# Patient Record
Sex: Male | Born: 1940
Health system: Southern US, Community
[De-identification: ages and names within clinical notes are randomized; demographics above are authoritative.]

## PROBLEM LIST (undated history)

## (undated) ENCOUNTER — Ambulatory Visit

## (undated) DIAGNOSIS — K5730 Diverticulosis of large intestine without perforation or abscess without bleeding: Secondary | ICD-10-CM

## (undated) DIAGNOSIS — M1990 Unspecified osteoarthritis, unspecified site: Secondary | ICD-10-CM

## (undated) DIAGNOSIS — IMO0002 Reserved for concepts with insufficient information to code with codable children: Secondary | ICD-10-CM

## (undated) DIAGNOSIS — G4733 Obstructive sleep apnea (adult) (pediatric): Secondary | ICD-10-CM

## (undated) DIAGNOSIS — J329 Chronic sinusitis, unspecified: Secondary | ICD-10-CM

## (undated) DIAGNOSIS — I10 Essential (primary) hypertension: Secondary | ICD-10-CM

## (undated) DIAGNOSIS — K219 Gastro-esophageal reflux disease without esophagitis: Secondary | ICD-10-CM

## (undated) DIAGNOSIS — R972 Elevated prostate specific antigen [PSA]: Secondary | ICD-10-CM

## (undated) DIAGNOSIS — E785 Hyperlipidemia, unspecified: Secondary | ICD-10-CM

## (undated) DIAGNOSIS — K4090 Unilateral inguinal hernia, without obstruction or gangrene, not specified as recurrent: Secondary | ICD-10-CM

## (undated) HISTORY — DX: Essential (primary) hypertension: I10

## (undated) HISTORY — DX: Elevated prostate specific antigen (PSA): R97.20

## (undated) HISTORY — DX: Obstructive sleep apnea (adult) (pediatric): G47.33

## (undated) HISTORY — DX: Gastro-esophageal reflux disease without esophagitis: K21.9

## (undated) HISTORY — DX: Reserved for concepts with insufficient information to code with codable children: IMO0002

## (undated) HISTORY — DX: Hyperlipidemia, unspecified: E78.5

## (undated) HISTORY — DX: Chronic sinusitis, unspecified: J32.9

## (undated) HISTORY — PX: TONSILLECTOMY: SUR1361

## (undated) HISTORY — DX: Unilateral inguinal hernia, without obstruction or gangrene, not specified as recurrent: K40.90

## (undated) HISTORY — PX: PROSTATE BIOPSY: SHX241

## (undated) HISTORY — DX: Diverticulosis of large intestine without perforation or abscess without bleeding: K57.30

---

## 2005-03-16 ENCOUNTER — Ambulatory Visit: Payer: Self-pay | Admitting: Internal Medicine

## 2005-08-24 ENCOUNTER — Ambulatory Visit: Payer: Self-pay | Admitting: Internal Medicine

## 2005-09-07 ENCOUNTER — Ambulatory Visit: Payer: Self-pay | Admitting: Internal Medicine

## 2005-10-02 ENCOUNTER — Ambulatory Visit: Payer: Self-pay | Admitting: Internal Medicine

## 2005-10-17 ENCOUNTER — Ambulatory Visit: Payer: Self-pay | Admitting: Internal Medicine

## 2005-12-24 ENCOUNTER — Ambulatory Visit: Payer: Self-pay | Admitting: Internal Medicine

## 2006-01-01 HISTORY — PX: COLONOSCOPY: SHX174

## 2006-01-04 ENCOUNTER — Ambulatory Visit: Payer: Self-pay | Admitting: Gastroenterology

## 2006-01-09 ENCOUNTER — Ambulatory Visit: Payer: Self-pay | Admitting: Internal Medicine

## 2006-02-05 ENCOUNTER — Ambulatory Visit: Payer: Self-pay | Admitting: Gastroenterology

## 2006-02-05 LAB — HM COLONOSCOPY

## 2006-04-02 ENCOUNTER — Ambulatory Visit: Payer: Self-pay | Admitting: Gastroenterology

## 2006-09-30 ENCOUNTER — Telehealth (INDEPENDENT_AMBULATORY_CARE_PROVIDER_SITE_OTHER): Payer: Self-pay | Admitting: *Deleted

## 2006-10-01 ENCOUNTER — Ambulatory Visit: Payer: Self-pay | Admitting: Internal Medicine

## 2006-10-01 DIAGNOSIS — R12 Heartburn: Secondary | ICD-10-CM

## 2006-10-01 DIAGNOSIS — K219 Gastro-esophageal reflux disease without esophagitis: Secondary | ICD-10-CM

## 2006-11-21 ENCOUNTER — Ambulatory Visit: Payer: Self-pay | Admitting: Internal Medicine

## 2006-12-25 ENCOUNTER — Telehealth (INDEPENDENT_AMBULATORY_CARE_PROVIDER_SITE_OTHER): Payer: Self-pay | Admitting: *Deleted

## 2006-12-25 ENCOUNTER — Ambulatory Visit: Payer: Self-pay | Admitting: Internal Medicine

## 2007-01-16 ENCOUNTER — Ambulatory Visit: Payer: Self-pay | Admitting: Internal Medicine

## 2007-01-23 ENCOUNTER — Ambulatory Visit: Payer: Self-pay | Admitting: Internal Medicine

## 2007-02-24 ENCOUNTER — Encounter: Payer: Self-pay | Admitting: Internal Medicine

## 2007-05-30 ENCOUNTER — Telehealth (INDEPENDENT_AMBULATORY_CARE_PROVIDER_SITE_OTHER): Payer: Self-pay | Admitting: *Deleted

## 2007-09-25 ENCOUNTER — Ambulatory Visit: Payer: Self-pay | Admitting: Internal Medicine

## 2007-10-02 ENCOUNTER — Ambulatory Visit: Payer: Self-pay | Admitting: Internal Medicine

## 2007-12-24 ENCOUNTER — Ambulatory Visit: Payer: Self-pay | Admitting: Internal Medicine

## 2008-07-02 ENCOUNTER — Ambulatory Visit: Payer: Self-pay | Admitting: Internal Medicine

## 2008-07-02 DIAGNOSIS — IMO0001 Reserved for inherently not codable concepts without codable children: Secondary | ICD-10-CM

## 2008-07-02 DIAGNOSIS — M549 Dorsalgia, unspecified: Secondary | ICD-10-CM | POA: Insufficient documentation

## 2008-10-26 ENCOUNTER — Ambulatory Visit: Payer: Self-pay | Admitting: Family Medicine

## 2008-10-26 ENCOUNTER — Encounter: Payer: Self-pay | Admitting: Internal Medicine

## 2008-10-26 DIAGNOSIS — D239 Other benign neoplasm of skin, unspecified: Secondary | ICD-10-CM | POA: Insufficient documentation

## 2008-11-03 ENCOUNTER — Encounter: Payer: Self-pay | Admitting: Family Medicine

## 2008-11-12 ENCOUNTER — Ambulatory Visit: Payer: Self-pay | Admitting: Gastroenterology

## 2008-11-12 LAB — CONVERTED CEMR LAB
Alkaline Phosphatase: 53 units/L (ref 39–117)
Eosinophils Absolute: 0.2 10*3/uL (ref 0.0–0.7)
Eosinophils Relative: 2.6 % (ref 0.0–5.0)
Glucose, Bld: 108 mg/dL — ABNORMAL HIGH (ref 70–99)
HCT: 49.4 % (ref 39.0–52.0)
Lymphs Abs: 1.9 10*3/uL (ref 0.7–4.0)
MCHC: 34.9 g/dL (ref 30.0–36.0)
MCV: 89.8 fL (ref 78.0–100.0)
Monocytes Absolute: 0.6 10*3/uL (ref 0.1–1.0)
Platelets: 232 10*3/uL (ref 150.0–400.0)
RDW: 11.6 % (ref 11.5–14.6)
Sodium: 141 meq/L (ref 135–145)
Total Bilirubin: 1.1 mg/dL (ref 0.3–1.2)
Total Protein: 6.7 g/dL (ref 6.0–8.3)
WBC: 7.8 10*3/uL (ref 4.5–10.5)

## 2008-11-21 ENCOUNTER — Emergency Department (HOSPITAL_COMMUNITY): Admission: EM | Admit: 2008-11-21 | Discharge: 2008-11-21 | Payer: Self-pay | Admitting: Emergency Medicine

## 2008-11-24 ENCOUNTER — Ambulatory Visit: Payer: Self-pay | Admitting: Internal Medicine

## 2008-11-29 ENCOUNTER — Encounter: Payer: Self-pay | Admitting: Internal Medicine

## 2008-11-30 ENCOUNTER — Telehealth (INDEPENDENT_AMBULATORY_CARE_PROVIDER_SITE_OTHER): Payer: Self-pay | Admitting: *Deleted

## 2008-12-30 ENCOUNTER — Ambulatory Visit: Payer: Self-pay | Admitting: Internal Medicine

## 2008-12-30 DIAGNOSIS — K6289 Other specified diseases of anus and rectum: Secondary | ICD-10-CM | POA: Insufficient documentation

## 2009-02-08 ENCOUNTER — Ambulatory Visit: Payer: Self-pay | Admitting: Internal Medicine

## 2009-02-08 DIAGNOSIS — R198 Other specified symptoms and signs involving the digestive system and abdomen: Secondary | ICD-10-CM

## 2009-03-21 ENCOUNTER — Ambulatory Visit: Payer: Self-pay | Admitting: Internal Medicine

## 2009-05-20 ENCOUNTER — Ambulatory Visit: Payer: Self-pay | Admitting: Internal Medicine

## 2009-05-20 DIAGNOSIS — R1032 Left lower quadrant pain: Secondary | ICD-10-CM | POA: Insufficient documentation

## 2009-05-20 DIAGNOSIS — K5730 Diverticulosis of large intestine without perforation or abscess without bleeding: Secondary | ICD-10-CM | POA: Insufficient documentation

## 2009-05-20 DIAGNOSIS — N509 Disorder of male genital organs, unspecified: Secondary | ICD-10-CM | POA: Insufficient documentation

## 2009-05-20 DIAGNOSIS — K429 Umbilical hernia without obstruction or gangrene: Secondary | ICD-10-CM | POA: Insufficient documentation

## 2009-05-20 DIAGNOSIS — K439 Ventral hernia without obstruction or gangrene: Secondary | ICD-10-CM | POA: Insufficient documentation

## 2009-08-24 ENCOUNTER — Ambulatory Visit: Payer: Self-pay | Admitting: Internal Medicine

## 2009-08-24 DIAGNOSIS — J310 Chronic rhinitis: Secondary | ICD-10-CM | POA: Insufficient documentation

## 2009-08-24 DIAGNOSIS — T887XXA Unspecified adverse effect of drug or medicament, initial encounter: Secondary | ICD-10-CM | POA: Insufficient documentation

## 2009-09-16 ENCOUNTER — Ambulatory Visit: Payer: Self-pay | Admitting: Internal Medicine

## 2009-09-16 DIAGNOSIS — R030 Elevated blood-pressure reading, without diagnosis of hypertension: Secondary | ICD-10-CM | POA: Insufficient documentation

## 2009-11-03 ENCOUNTER — Ambulatory Visit: Payer: Self-pay | Admitting: Internal Medicine

## 2009-11-03 DIAGNOSIS — I10 Essential (primary) hypertension: Secondary | ICD-10-CM | POA: Insufficient documentation

## 2009-11-03 DIAGNOSIS — K4090 Unilateral inguinal hernia, without obstruction or gangrene, not specified as recurrent: Secondary | ICD-10-CM | POA: Insufficient documentation

## 2009-11-18 ENCOUNTER — Encounter: Payer: Self-pay | Admitting: Internal Medicine

## 2009-11-23 ENCOUNTER — Ambulatory Visit: Payer: Self-pay | Admitting: Internal Medicine

## 2010-01-31 NOTE — Assessment & Plan Note (Signed)
Summary: umbilical hernia//lch   Vital Signs:  Patient profile:   70 year old male Weight:      228 pounds Temp:     98.9 degrees F oral Pulse rate:   91 / minute Resp:     16 per minute BP sitting:   128 / 80  (left arm) Cuff size:   large  Vitals Entered By: Shonna Chock (May 20, 2009 9:04 AM) CC: 1.) Hernia  2.) Abdominal Discomfort -seen urologist last Friday, Abdominal pain Comments REVIEWED MED LIST, PATIENT AGREED DOSE AND INSTRUCTION CORRECT    Primary Care Provider:  Marga Melnick, MD  CC:  1.) Hernia  2.) Abdominal Discomfort -seen urologist last Friday and Abdominal pain.  History of Present Illness: He has had an umbilical hernia which is asymptomatic & stable > 12 months; his Urologist mentioned it. Dr. Merry Lofty is treating him for testicular pain, ?  related to injury 40 years ago The L  testicle "draws up " with "discomfort" lasting hours.  His 3rd concern is LLQ intermittent  "consticting  gas" abdominal pain 2-3 months, as if "a blockage "is there. He had  Augmentin ( for cat bite) related colitis in 12/2008; C. difficle studies were negative. Loose stool normaloized with  Align  .   The patient denies nausea, vomiting, diarrhea, constipation, melena, hematochezia, anorexia, and hematemesis.  The location of the pain is left lower quadrant.  The pain is described as intermittent.  The patient denies the following symptoms: fever, weight loss( 30# gain ove 3 years), dysuria, chest pain, jaundice, and dark urine.  The pain is better with lying down. Last colonoscopy  revealed diverticulosis  in 2008.  Allergies: 1)  ! Augmentin (Amoxicillin-Pot Clavulanate)  Past History:  Past Medical History: URI GERD chest pain heartburn elevated psa Diverticulosis, colon  Review of Systems ENT:  Denies difficulty swallowing and hoarseness. GI:  Complains of gas; denies indigestion; No dysphagia. GU:  Denies discharge, dysuria, and hematuria; UA negative ; PSA  stable @  5.3(high has been 9).. Derm:  Denies changes in color of skin, lesion(s), and rash. Neuro:  Denies brief paralysis, numbness, tingling, and weakness; No radicular pain on L.  Physical Exam  General:  well-nourished,in no acute distress; alert,appropriate and cooperative throughout examination Eyes:  No corneal or conjunctival inflammation noted.No icterus Lungs:  Normal respiratory effort, chest expands symmetrically. Lungs are clear to auscultation, no crackles or wheezes. Heart:  Normal rate and regular rhythm. S1 and S2 normal without gallop, murmur, click, rub or other extra sounds. Abdomen:  Bowel sounds positive,abdomen soft and non-tender without masses, organomegaly. Ventral & umbilical  hernias noted. Genitalia:  Testes bilaterally descended without nodularity, tenderness or masses. No scrotal masses or lesions. No penis lesions or urethral discharge. Extremities:  Negative SLR Neurologic:  alert & oriented X3 and DTRs symmetrical and normal.   Skin:  Intact without suspicious lesions or rashes. No juaundice Cervical Nodes:  No lymphadenopathy noted Axillary Nodes:  No palpable lymphadenopathy Inguinal Nodes:  No significant adenopathy   Impression & Recommendations:  Problem # 1:  ABDOMINAL PAIN, LEFT LOWER QUADRANT (ICD-789.04)  gas pain  Orders: Prescription Created Electronically [phone])  Problem # 2:  DIVERTICULOSIS, COLON (ICD-562.10)  Problem # 3:  TESTICULAR PAIN, LEFT (ICD-608.9)  as per Dr Merry Lofty  Orders: Prescription Created Electronically [phone])  Problem # 4:  UMBILICAL HERNIA (ICD-553.1)  Problem # 5:  VENTRAL HERNIA (ICD-553.20)  Complete Medication List: 1)  Cyclobenzaprine Hcl 5 Mg Tabs (  Cyclobenzaprine hcl) .Marland Kitchen.. 1 two times a day & 1-2 at bedtime as needed 2)  Arthrotec 75-200 Mg-mcg Tabs (Diclofenac-misoprostol) .Marland Kitchen.. 1 two times a day as needed 3)  Gabapentin 100 Mg Caps (Gabapentin) .Marland Kitchen.. 1 every 8 hrs for pain in scrotum 4)  Hyoscyamine  Sulfate 0.125 Mg Subl (Hyoscyamine sulfate) .Marland Kitchen.. 1 sl q 6 hrs as needed gas pain  Patient Instructions: 1)  Consume < 40 grams of HFCS "sugar" / day as discussed. Prescriptions: HYOSCYAMINE SULFATE 0.125 MG SUBL (HYOSCYAMINE SULFATE) 1 SL q 6 hrs as needed gas pain  #30 x 1   Entered and Authorized by:   Marga Melnick MD   Signed by:   Marga Melnick MD on 05/20/2009   Method used:   Faxed to ...       [facility] [phone]* (retail)       [address]       [place], [place]  [address]       Ph: [phone]       Fax: [phone]   RxID:   [id number] GABAPENTIN 100 MG CAPS (GABAPENTIN) 1 every 8 hrs for pain in scrotum  #30 x 1   Entered and Authorized by:   Marga Melnick MD   Signed by:   Marga Melnick MD on 05/20/2009   Method used:   Faxed to ...       [facility] [phone]* (retail)       [address]       [place], [place]  [address]       Ph: [phone]       Fax: [phone]   RxID:   [id number]    Immunization History:  Tetanus/Td Immunization History:    Tetanus/Td:  historical (11/21/2008)

## 2010-01-31 NOTE — Assessment & Plan Note (Signed)
Summary: irritation of bowels/kdc   Vital Signs:  Patient profile:   70 year old male Weight:      232.4 pounds Temp:     98.8 degrees F oral Pulse rate:   88 / minute Resp:     16 per minute BP sitting:   140 / 88  (left arm) Cuff size:   large  Vitals Entered By: Shonna Chock (February 08, 2009 12:27 PM) CC: Bowel Concerns Comments REVIEWED MED LIST, PATIENT AGREED DOSE AND INSTRUCTION CORRECT    Primary Care Provider:  Marga Melnick, MD  CC:  Bowel Concerns.  History of Present Illness: Anal irritation resolved after 10  days but recurred when anal hygiene  measures & Proctofoam completed. PMH of Internal Hemorrhoids @ Colonoscopy 02/05/2006, repeat in 2018.  Allergies: 1)  ! Augmentin (Amoxicillin-Pot Clavulanate)  Past History:  Past Surgical History: Tonsillectomy colonoscopy neg 1998, 2008 neg except for 3 external hemorrhoids, Dr Christella Hartigan, due 2018 abnormal prostate biopsy normal prostate biopsy microwave heat prescription prostate psa 6  Review of Systems General:  Denies weight loss. GI:  Complains of change in bowel habits; denies abdominal pain, bloody stools, dark tarry stools, and indigestion; BMs vary from hard to loose.  Physical Exam  General:  well-nourished,in no acute distress; alert,appropriate and cooperative throughout examination Abdomen:  Bowel sounds positive,abdomen soft and non-tender without masses, organomegaly or hernias noted. Rectal:  No external abnormalities noted.Residual fecal material @ rectum   Impression & Recommendations:  Problem # 1:  OTHER SPECIFIED DISORDER OF RECTUM AND ANUS (ICD-569.49) from rectal  irritation due to residual stool & tissue fragments  Problem # 2:  CHANGE IN BOWELS (ICD-787.99) Intermittent loose stool  Complete Medication List: 1)  Proctofoam 1 % Foam (Pramoxine hcl) .... Apply two times a day as needed  Patient Instructions: 1)  Rectal hygiene as discussed. Trial of fiber supplement samples  provided.Continue hydration to 40 oz / day. Prescriptions: PROCTOFOAM 1 % FOAM (PRAMOXINE HCL) apply two times a day as needed  #1 x 3   Entered and Authorized by:   Marga Melnick MD   Signed by:   Marga Melnick MD on 02/08/2009   Method used:   Print then Give to Patient   RxID:   [id number]

## 2010-01-31 NOTE — Assessment & Plan Note (Signed)
Summary: backpain/kdc   Vital Signs:  Patient profile:   70 year old male Weight:      229.0 pounds Temp:     98.5 degrees F oral Pulse rate:   88 / minute Resp:     16 per minute BP sitting:   122 / 76  (left arm) Cuff size:   large  Vitals Entered By: Shonna Chock (March 21, 2009 3:49 PM) CC: Back pain (off/on concern) Comments REVIEWED MED LIST, PATIENT AGREED DOSE AND INSTRUCTION CORRECT    Primary Care Provider:  Marga Melnick, MD  CC:  Back pain (off/on concern).  History of Present Illness:  Back Pain      This is a 70 year old man who presents with Back pain since 03/18/2009 as soreness after watching tv. Overnight he had severe stiffness.  The patient denies fever, chills, weakness, loss of sensation, fecal incontinence, urinary incontinence, urinary retention, dysuria, rest pain, inability to work, and inability to care for self.  The pain is located in the left low back.  The pain began at home and gradually.  The pain is made worse by standing , waist  flexion, and inactivity.  The pain is made better by activity and muscle relaxants.  Risk factors for serious underlying conditions include age >= 50 years. PMH of trauma to back 20+ years ago, having fallen on ice. He has had recurrent LBP since. PMH DDD in thoracic spine , S/P Physical  Therapy with benefit.  Allergies: 1)  ! Augmentin (Amoxicillin-Pot Clavulanate)  Review of Systems GU:  Denies discharge and hematuria. Derm:  Denies lesion(s) and rash. Heme:  Denies abnormal bruising and bleeding.  Physical Exam  General:  in no acute distress;  cooperative throughout examination; appears uncomfortable-appearing.   Abdomen:  Bowel sounds positive,abdomen soft and non-tender without masses, organomegaly.Ventral hernia Msk:  No deformity or scoliosis noted of thoracic or lumbar spine.  No tenderness to percussion. Lay down & sat up /o help Extremities:  Neg SLR Neurologic:  alert & oriented X3, strength normal in  all extremities, heel/toe gait normal, and DTRs symmetrical and normal.   Skin:  Intact without suspicious lesions or rashes Psych:  memory intact for recent and remote, normally interactive, and good eye contact.     Impression & Recommendations:  Problem # 1:  BACK PAIN, ACUTE (ICD-724.5)  His updated medication list for this problem includes:    Cyclobenzaprine Hcl 5 Mg Tabs (Cyclobenzaprine hcl) .Marland Kitchen... 1 two times a day & 1-2 at bedtime as needed    Arthrotec 75-200 Mg-mcg Tabs (Diclofenac-misoprostol) .Marland Kitchen... 1 two times a day as needed  Complete Medication List: 1)  Cyclobenzaprine Hcl 5 Mg Tabs (Cyclobenzaprine hcl) .Marland Kitchen.. 1 two times a day & 1-2 at bedtime as needed 2)  Arthrotec 75-200 Mg-mcg Tabs (Diclofenac-misoprostol) .Marland Kitchen.. 1 two times a day as needed  Patient Instructions: 1)   Films of LS spine & Physical Therapy if no better. Prescriptions: ARTHROTEC 75-200 MG-MCG TABS (DICLOFENAC-MISOPROSTOL) 1 two times a day as needed  #20 x 0   Entered and Authorized by:   Marga Melnick MD   Signed by:   Marga Melnick MD on 03/21/2009   Method used:   Samples Given   RxID:   [id number]

## 2010-01-31 NOTE — Assessment & Plan Note (Signed)
Summary: discuss hernia surgery///sph   Vital Signs:  Patient profile:   70 year old male Weight:      221 pounds BMI:     30.93 Temp:     98.5 degrees F oral Pulse rate:   80 / minute Resp:     15 per minute BP sitting:   110 / 66  (left arm) Cuff size:   large  Vitals Entered By: Shonna Chock CMA (November 23, 2009 10:07 AM) CC: 1.) Hernia concerns  2.) B/P concerns   3.) Back concerns: off/on   Primary Care Provider:  Marga Melnick, MD  CC:  1.) Hernia concerns  2.) B/P concerns   3.) Back concerns: off/on.  History of Present Illness:       Dr Merry Lofty has recommended surgery despite resolution of pain.  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 Now he buys smaller bags w/o "bulge". Hypertension Follow-Up      This is a 70 year old man who  also presents for Hypertension follow-up.  The patient denies lightheadedness, headaches, and fatigue.  The patient denies the following associated symptoms: chest pain, chest pressure, dyspnea, palpitations, leg edema, and pedal edema.  The patient reports exercising daily.  Adjunctive measures currently used by the patient include salt restriction.  BP has averaged 145/87 ; BP "swings " by 30 points. BP recheck 125/84. Care of feral cats has been stressful @ times.  Current Medications (verified): 1)  Cyclobenzaprine Hcl 5 Mg Tabs (Cyclobenzaprine Hcl) .Marland Kitchen.. 1 Two Times A Day & 1-2 At Bedtime As Needed 2)  Arthrotec 75-200 Mg-Mcg Tabs (Diclofenac-Misoprostol) .Marland Kitchen.. 1 Two Times A Day As Needed 3)  Gabapentin 100 Mg Caps (Gabapentin) .Marland Kitchen.. 1 Every 8 Hrs For Pain in Scrotum 4)  Hyoscyamine Sulfate 0.125 Mg Subl (Hyoscyamine Sulfate) .Marland Kitchen.. 1 Sl Q 6 Hrs As Needed Gas Pain 5)  Avodart 0.5 Mg Caps (Dutasteride) .Marland Kitchen.. 1 By Mouth Once Daily 6)  Losartan Potassium 50 Mg Tabs (Losartan Potassium) .Marland Kitchen.. 1 Once Daily 7)  Uroxatral 10 Mg Xr24h-Tab (Alfuzosin Hcl) .Marland Kitchen.. 1 By Mouth Once Daily 8)  Flonase 50 Mcg/act Susp (Fluticasone Propionate) .... Once  Daily  Allergies: 1)  ! Augmentin (Amoxicillin-Pot Clavulanate)  Physical Exam  General:  in no acute distress; alert,appropriate and cooperative throughout examination Lungs:  Normal respiratory effort, chest expands symmetrically. Lungs are clear to auscultation, no crackles or wheezes. Heart:  Normal rate and regular rhythm. S1 and S2 normal without  murmur, click, rub . S4  Genitalia:  Testes bilaterally descended without nodularity, tenderness or masses. No scrotal masses or lesions. No penis lesions or urethral discharge. No hernia documented Pulses:  R and L carotid,radial,dorsalis pedis and posterior tibial pulses are full and equal bilaterally Extremities:  No clubbing, cyanosis, edema   Impression & Recommendations:  Problem # 1:  INGUINAL HERNIA, LEFT (ICD-550.90) not present @ present; options discussed  Problem # 2:  HYPERTENSION (ICD-401.9) controlled His updated medication list for this problem includes:    Losartan Potassium 50 Mg Tabs (Losartan potassium) .Marland Kitchen... 1 once daily  Complete Medication List: 1)  Cyclobenzaprine Hcl 5 Mg Tabs (Cyclobenzaprine hcl) .Marland Kitchen.. 1 two times a day & 1-2 at bedtime as needed 2)  Arthrotec 75-200 Mg-mcg Tabs (Diclofenac-misoprostol) .Marland Kitchen.. 1 two times a day as needed 3)  Gabapentin 100 Mg Caps (Gabapentin) .Marland Kitchen.. 1 every 8 hrs for pain in scrotum 4)  Hyoscyamine Sulfate 0.125 Mg Subl (Hyoscyamine sulfate) .Marland Kitchen.. 1 sl q 6 hrs  as needed gas pain 5)  Avodart 0.5 Mg Caps (Dutasteride) .Marland Kitchen.. 1 by mouth once daily 6)  Losartan Potassium 50 Mg Tabs (Losartan potassium) .Marland Kitchen.. 1 once daily 7)  Uroxatral 10 Mg Xr24h-tab (Alfuzosin hcl) .Marland Kitchen.. 1 by mouth once daily 8)  Flonase 50 Mcg/act Susp (Fluticasone propionate) .... Once daily  Patient Instructions: 1)  Check your Blood Pressure regularly. Your goal=  135/85 ON AVERAGE . Please take cuff to al MD visits.   Orders Added: 1)  Est. Patient Level III [56213]

## 2010-01-31 NOTE — Assessment & Plan Note (Signed)
Summary: BP UP AT DENTIST  166/98; ON ALPHA BLOCKER///SPH   Vital Signs:  Patient profile:   70 year old male Weight:      228 pounds BMI:     31.91 Temp:     98.4 degrees F oral Pulse rate:   94 / minute Resp:     14 per minute BP sitting:   130 / 82  (left arm) Cuff size:   large  Vitals Entered By: Shonna Chock CMA (September 16, 2009 10:31 AM) CC: B/P concerns when at the Dentist (2 days ago), patient would like to discuss Uroxatral   Primary Care Provider:  Marga Melnick, MD  CC:  B/P concerns when at the Dentist (2 days ago) and patient would like to discuss Uroxatral.  History of Present Illness: Hypertension Follow-Up      This is a 70 year old man who presents for Hypertension follow-up.  The patient denies lightheadedness, urinary frequency, headaches, and edema.  The patient denies the following associated symptoms: chest pain, chest pressure, exercise intolerance, dyspnea, and palpitations.  The patient reports no exercise.  Adjunctive measures currently  NOT used by the patient is  salt restriction.  BP @ Dentist was 165/98 on 09/13. No CVE.  Current Medications (verified): 1)  Cyclobenzaprine Hcl 5 Mg Tabs (Cyclobenzaprine Hcl) .Marland Kitchen.. 1 Two Times A Day & 1-2 At Bedtime As Needed 2)  Arthrotec 75-200 Mg-Mcg Tabs (Diclofenac-Misoprostol) .Marland Kitchen.. 1 Two Times A Day As Needed 3)  Gabapentin 100 Mg Caps (Gabapentin) .Marland Kitchen.. 1 Every 8 Hrs For Pain in Scrotum 4)  Hyoscyamine Sulfate 0.125 Mg Subl (Hyoscyamine Sulfate) .Marland Kitchen.. 1 Sl Q 6 Hrs As Needed Gas Pain 5)  Uroxatral 10 Mg Xr24h-Tab (Alfuzosin Hcl) .Marland Kitchen.. 1 By Mouth Once Daily 6)  Avodart 0.5 Mg Caps (Dutasteride) .Marland Kitchen.. 1 By Mouth Once Daily 7)  Fluticasone Propionate 50 Mcg/act Susp (Fluticasone Propionate) .Marland Kitchen.. 1 Spray Two Times A Day As Directed  Allergies: 1)  ! Augmentin (Amoxicillin-Pot Clavulanate)  Physical Exam  General:  in no acute distress; alert,appropriate and cooperative throughout examination Lungs:  Normal  respiratory effort, chest expands symmetrically. Lungs are clear to auscultation, no crackles or wheezes. Heart:  P 96. regular rhythm. S1 and S2 normal without gallop, murmur, click, rub or other extra sounds. Repeat BP 130/88 on R & 120/85 on L. Abdomen:  Bowel sounds positive,abdomen soft and non-tender without masses, organomegaly.Ventral & umbilical hernias Pulses:  R and L carotid,radial,dorsalis pedis and posterior tibial pulses are full and equal bilaterally Extremities:  trace left pedal edema and trace right pedal edema.     Impression & Recommendations:  Problem # 1:  ELEVATED BLOOD PRESSURE WITHOUT DIAGNOSIS OF HYPERTENSION (ICD-796.2)  Complete Medication List: 1)  Cyclobenzaprine Hcl 5 Mg Tabs (Cyclobenzaprine hcl) .Marland Kitchen.. 1 two times a day & 1-2 at bedtime as needed 2)  Arthrotec 75-200 Mg-mcg Tabs (Diclofenac-misoprostol) .Marland Kitchen.. 1 two times a day as needed 3)  Gabapentin 100 Mg Caps (Gabapentin) .Marland Kitchen.. 1 every 8 hrs for pain in scrotum 4)  Hyoscyamine Sulfate 0.125 Mg Subl (Hyoscyamine sulfate) .Marland Kitchen.. 1 sl q 6 hrs as needed gas pain 5)  Uroxatral 10 Mg Xr24h-tab (Alfuzosin hcl) .Marland Kitchen.. 1 by mouth once daily 6)  Avodart 0.5 Mg Caps (Dutasteride) .Marland Kitchen.. 1 by mouth once daily 7)  Fluticasone Propionate 50 Mcg/act Susp (Fluticasone propionate) .Marland Kitchen.. 1 spray two times a day as directed  Other Orders: Flu Vaccine 67yrs + MEDICARE PATIENTS (Z6109) Administration Flu vaccine - MCR (G0008) Flu  Vaccine Consent Questions     Do you have a history of severe allergic reactions to this vaccine? no    Any prior history of allergic reactions to egg and/or gelatin? no    Do you have a sensitivity to the preservative Thimersol? no    Do you have a past history of Guillan-Barre Syndrome? no    Do you currently have an acute febrile illness? no    Have you ever had a severe reaction to latex? no    Vaccine information given and explained to patient? yes    Are you currently pregnant? no    Lot  Number:[id number]   Exp Date:07/01/2010   Site Given  Right Deltoid IMers: Flu Vaccine 32yrs + MEDICARE PATIENTS (V7846) Administration Flu vaccine - MCR (N6295)  Patient Instructions: 1)  Consider low carb nutrition program as discussed. 2)  Limit your Sodium (Salt) to less than 4 grams a day (slightly less than 1 teaspoon) to prevent fluid retention, swelling, or worsening or symptoms. 3)  It is important that you exercise regularly at least 20 minutes 5 times a week. If you develop chest pain, have severe difficulty breathing, or feel very tired , stop exercising immediately and seek medical attention. 4)  Check your Blood Pressure regularly. Your goal = AVERAGE < 135/85. 5)  Please schedule a follow-up appointment in 2 months. Marland Kitchenlbmedflu

## 2010-01-31 NOTE — Assessment & Plan Note (Signed)
Summary: sinus problems/kn   Vital Signs:  Patient profile:   70 year old male Weight:      227.2 pounds BMI:     31.80 Temp:     98.6 degrees F oral Pulse rate:   78 / minute Resp:     16 per minute BP sitting:   138 / 82  (left arm) Cuff size:   large  Vitals Entered By: Shonna Chock CMA (August 24, 2009 10:30 AM) CC: Sinus Conerns- related to Uroxtrol (patient will see urologist next week), URI symptoms   Primary Care Provider:  Marga Melnick, MD  CC:  Sinus Conerns- related to Uroxtrol (patient will see urologist next week) and URI symptoms.  History of Present Illness:  URT  Symptoms      This is a 70 year old man who presents with URT symptoms with Urologic meds, worse with Rapiflo, least with Uroxatral Rxed by Dr Merry Lofty.  The patient reports nasal congestion, but denies purulent nasal discharge and productive cough.  The patient denies fever, dyspnea, and wheezing.  The patient also reports  frontal headache.  The patient denies itchy watery eyes and sneezing. also he has had  facial pain.  The patient denies the following risk factors for Strep sinusitis: tooth pain and tender adenopathy.  Showering helps symptoms.  Allergies: 1)  ! Augmentin (Amoxicillin-Pot Clavulanate)  Physical Exam  General:  in no acute distress; alert,appropriate and cooperative throughout examination Eyes:  No corneal or conjunctival inflammation noted. Perrla. Ears:  External ear exam shows no significant lesions or deformities.  Otoscopic examination reveals clear canals, tympanic membranes are intact bilaterally without bulging, retraction, inflammation or discharge. Hearing is grossly normal bilaterally. Nose:  External nasal examination shows no deformity or inflammation. Nasal mucosa are pink and moist without lesions or exudates. Mouth:  Oral mucosa and oropharynx without lesions or exudates.  Teeth in good repair. Septal deviation Lungs:  Normal respiratory effort, chest expands  symmetrically. Lungs are clear to auscultation, no crackles or wheezes. Cervical Nodes:  No lymphadenopathy noted Axillary Nodes:  No palpable lymphadenopathy   Impression & Recommendations:  Problem # 1:  RHINITIS (ICD-477.9)  congestion due to #2  His updated medication list for this problem includes:    Fluticasone Propionate 50 Mcg/act Susp (Fluticasone propionate) .Marland Kitchen... 1 spray two times a day as directed  Problem # 2:  ADVERSE DRUG REACTION (ICD-995.20)  Complete Medication List: 1)  Cyclobenzaprine Hcl 5 Mg Tabs (Cyclobenzaprine hcl) .Marland Kitchen.. 1 two times a day & 1-2 at bedtime as needed 2)  Arthrotec 75-200 Mg-mcg Tabs (Diclofenac-misoprostol) .Marland Kitchen.. 1 two times a day as needed 3)  Gabapentin 100 Mg Caps (Gabapentin) .Marland Kitchen.. 1 every 8 hrs for pain in scrotum 4)  Hyoscyamine Sulfate 0.125 Mg Subl (Hyoscyamine sulfate) .Marland Kitchen.. 1 sl q 6 hrs as needed gas pain 5)  Uroxatral 10 Mg Xr24h-tab (Alfuzosin hcl) .Marland Kitchen.. 1 by mouth once daily 6)  Avodart 0.5 Mg Caps (Dutasteride) .Marland Kitchen.. 1 by mouth once daily 7)  Fluticasone Propionate 50 Mcg/act Susp (Fluticasone propionate) .Marland Kitchen.. 1 spray two times a day as directed  Patient Instructions: 1)  Neti pot once daily - two times a day .  Report pus & fever. Prescriptions: FLUTICASONE PROPIONATE 50 MCG/ACT SUSP (FLUTICASONE PROPIONATE) 1 spray two times a day as directed  #1 x 11   Entered and Authorized by:   Marga Melnick MD   Signed by:   Marga Melnick MD on 08/24/2009   Method used:  Faxed to ...       [facility] [phone]* (retail)       [address]       [place], [place]  [address]       Ph: [phone]       Fax: [phone]   RxID:   [id number]

## 2010-01-31 NOTE — Assessment & Plan Note (Signed)
Summary: PAIN IN THE GROINS//PH   Vital Signs:  Patient profile:   70 year old male Weight:      221.6 pounds BMI:     31.02 Pulse rate:   80 / minute Resp:     17 per minute BP sitting:   168 / 94  (left arm) Cuff size:   large  Vitals Entered By: Shonna Chock CMA (November 03, 2009 2:48 PM) CC: Pain in groin and compare B/P cuffs. Patient's cuff: 170/106 pulse: 92, Abdominal pain   Primary Care Provider:  Marga Melnick, MD  CC:  Pain in groin and compare B/P cuffs. Patient's cuff: 170/106 pulse: 92 and Abdominal pain.  History of Present Illness: Abdominal Pain      This is a 70 year old man who presents with Abdominal pain as of this am. He D/Ced Uroxatrol 10/31 ; he had to strain to urinate last night.  The patient denies nausea, vomiting, diarrhea, constipation, melena, hematochezia, anorexia, and hematemesis.  The location of the pain is  L suprapubic.  The pain is described as intermittent and pressue.  The patient denies the following symptoms: fever, weight loss, dysuria, chest pain, jaundice, and dark urine.  Last colonoscopy neg 2008. Hypertension Follow-Up      The patient also presents for Hypertension follow-up.  The patient reports urinary frequency, but denies lightheadedness, headaches, edema, and fatigue.  The patient denies the following associated symptoms: exercise intolerance, dyspnea, and palpitations.  The patient reports exercising daily.  Adjunctive measures currently used by the patient include salt restriction.  His cuff correlates with ours(170/106 vs 168/94). Average @ home 148/90.  Current Medications (verified): 1)  Cyclobenzaprine Hcl 5 Mg Tabs (Cyclobenzaprine Hcl) .Marland Kitchen.. 1 Two Times A Day & 1-2 At Bedtime As Needed 2)  Arthrotec 75-200 Mg-Mcg Tabs (Diclofenac-Misoprostol) .Marland Kitchen.. 1 Two Times A Day As Needed 3)  Gabapentin 100 Mg Caps (Gabapentin) .Marland Kitchen.. 1 Every 8 Hrs For Pain in Scrotum 4)  Hyoscyamine Sulfate 0.125 Mg Subl (Hyoscyamine Sulfate) .Marland Kitchen.. 1 Sl Q  6 Hrs As Needed Gas Pain 5)  Avodart 0.5 Mg Caps (Dutasteride) .Marland Kitchen.. 1 By Mouth Once Daily  Allergies: 1)  ! Augmentin (Amoxicillin-Pot Clavulanate)  Physical Exam  General:  well-nourished,in no acute distress; alert,appropriate and cooperative throughout examination Lungs:  Normal respiratory effort, chest expands symmetrically. Lungs are clear to auscultation, no crackles or wheezes. Heart:  Normal rate and regular rhythm. S1 and S2 normal without gallop, murmur, click, rub . S 4 Abdomen:  Bowel sounds positive,abdomen soft and non-tender without masses, organomegaly . Tender LLQ ( clinically direct inguinal hernia) Pulses:  R and L carotid,radial,dorsalis pedis and posterior tibial pulses are full and equal bilaterally Extremities:  No clubbing, cyanosis, edema. Neurologic:  alert & oriented X3.   Skin:  Intact without suspicious lesions or rashes Cervical Nodes:  No lymphadenopathy noted Axillary Nodes:  No palpable lymphadenopathy Inguinal Nodes:  No significant adenopathy Psych:  memory intact for recent and remote, normally interactive, and good eye contact.     Impression & Recommendations:  Problem # 1:  INGUINAL HERNIA, LEFT (ICD-550.90) Clinically not palpable  but suggested by history & exam  Problem # 2:  HYPERTENSION (ICD-401.9)  His updated medication list for this problem includes:    Losartan Potassium 50 Mg Tabs (Losartan potassium) .Marland Kitchen... 1 once daily  Complete Medication List: 1)  Cyclobenzaprine Hcl 5 Mg Tabs (Cyclobenzaprine hcl) .Marland Kitchen.. 1 two times a day & 1-2 at bedtime as needed 2)  Arthrotec 75-200 Mg-mcg Tabs (Diclofenac-misoprostol) .Marland Kitchen.. 1 two times a day as needed 3)  Gabapentin 100 Mg Caps (Gabapentin) .Marland Kitchen.. 1 every 8 hrs for pain in scrotum 4)  Hyoscyamine Sulfate 0.125 Mg Subl (Hyoscyamine sulfate) .Marland Kitchen.. 1 sl q 6 hrs as needed gas pain 5)  Avodart 0.5 Mg Caps (Dutasteride) .Marland Kitchen.. 1 by mouth once daily 6)  Losartan Potassium 50 Mg Tabs (Losartan potassium)  .Marland Kitchen.. 1 once daily  Patient Instructions: 1)  Resume Uroxatral until you talk to Dr Merry Lofty. 2)  Drink clear liquids only for the next 24 hours, then slowly add other liquids and food as you  tolerate them. 3)  Check your Blood Pressure regularly. Your goal = AVERAGE < 135/85. Prescriptions: LOSARTAN POTASSIUM 50 MG TABS (LOSARTAN POTASSIUM) 1 once daily  #30 x 5   Entered and Authorized by:   Marga Melnick MD   Signed by:   Marga Melnick MD on 11/03/2009   Method used:   Print then Give to Patient   RxID:   [id number]    Orders Added: 1)  Est. Patient Level IV [87564]

## 2010-02-02 NOTE — Letter (Signed)
[facility]: [facility]   Imported By: Lanelle Bal 12/12/2009 14:19:45  _____________________________________________________________________  External Attachment:    Type:   Image     Comment:   External Document

## 2010-04-05 ENCOUNTER — Telehealth: Payer: Self-pay | Admitting: Internal Medicine

## 2010-04-05 NOTE — Telephone Encounter (Signed)
Patient called to make an appointment with Dr [name] for lower back pain--Dr [name] did not have any appointments for this week---patient said that he wouldn't be making a request for an appointment if he had not run out of his medicines that he takes on a "as needed" basis for back pain  (refill dates have expired last month)  Please call in prescriptions for 1) Cyclobenzaprine and 2) Tramadol  ---to [facility], [facility]

## 2010-04-05 NOTE — Telephone Encounter (Signed)
Renew tramadol 50 mg one every 6 hours as needed dispense 30. New generic Flexeril 5 mg 1 twice a day and 1-2 @ bedtime as needed ; dispense 20 no refill; office visit  if no better

## 2010-04-05 NOTE — Telephone Encounter (Signed)
Dr.Hopper please advise on Tramadol refill request, I checked Old EMR system not on active med list and is it ok to dispense #90 for flexeril

## 2010-04-06 MED ORDER — CYCLOBENZAPRINE HCL 5 MG PO TABS: 5.0000 mg | ORAL_TABLET | ORAL | Status: DC

## 2010-04-06 MED ORDER — TRAMADOL HCL 50 MG PO TABS: 50.0000 mg | ORAL_TABLET | Freq: Four times a day (QID) | ORAL | Status: DC | PRN

## 2010-04-06 NOTE — Telephone Encounter (Signed)
Patient aware rx's called in and OV if no better

## 2010-04-10 ENCOUNTER — Ambulatory Visit (INDEPENDENT_AMBULATORY_CARE_PROVIDER_SITE_OTHER): Payer: Medicare Other | Admitting: Internal Medicine

## 2010-04-10 ENCOUNTER — Encounter: Payer: Self-pay | Admitting: Internal Medicine

## 2010-04-10 VITALS — BP 132/80 | HR 112 | Temp 98.6°F | Wt 224.0 lb

## 2010-04-10 DIAGNOSIS — M546 Pain in thoracic spine: Secondary | ICD-10-CM

## 2010-04-10 NOTE — Patient Instructions (Signed)
It is essential is to treat BOTH pain & muscle spasm.

## 2010-04-10 NOTE — Progress Notes (Signed)
  Subjective:    Patient ID: Todd Larson, male    DOB: [birth year], 70 y.o.   MRN: [id number]  HPI Onset 04/05/2010 after  shower after bending over to dry legs. Symptoms"50%"  better with muscle relaxant called in . Tramadol never taken. Persistant pain is "muscle tightness " @ lower thoracic spine. Aching in R thigh intermittently.In past pain has been LS in location.  Review of Systems  Constitutional: Positive for activity change. Negative for fever, chills and diaphoresis.  Gastrointestinal: Negative for abdominal pain, diarrhea, constipation and blood in stool.       No incontinence  Genitourinary: Positive for difficulty urinating. Negative for dysuria, hematuria, flank pain and discharge.  Musculoskeletal: Positive for gait problem.       Walking bent over  Skin: Negative for rash.  Neurological: Negative for weakness and numbness.       Tingling 04/04-05 in R thigh  Hematological: Does not bruise/bleed easily.   He had difficulty urinating last night only. Back pain has limited ambulation.     Physical Exam  Exam  Gen.: Healthy and well-nourished in appearance. Alert, appropriate and cooperative throughout exam. Abdomen: Bowel sounds normal; abdomen soft and nontender. No masses, organomegaly. Ventral   & small umbilical  Hernias  noted. Genitalia: Scrotal and penile  exam  atrophy R testicle.No inguinal hernias. Musculoskeletal/extremities: No deformity or scoliosis noted of  the thoracic or lumbar spine. No clubbing, cyanosis, edema, or deformity noted. Range of motion  Normal.Neg SLR bilaterally .Tone & strength   good.Joints normal. Nail health good. Vascular:Dorsalis posterior tibial pulses are full and equal. No bruits present. Neurologic: Alert and oriented x3. Deep tendon reflexes symmetrical and normal. Sensation to light touch normal over legs. Gait (heel/toe ) normal. Romberg negative.  Skin: Intact without suspicious lesions or rashes.  Lymph: No cervical, axillary, or  inguinal lymphadenopathy present.  Psych: Mood and affect are normal; no evidence of anxiety or depression.      Assessment & Plan:   #1 Mid back syndrome with muscle spasm; no pain meds taken . No pathologic findings . Plan:#1  take Tramadol as Rxed by phone. Imaging & Physical Therapy referral if no better over 72 hrs.Marland Kitchen

## 2010-04-13 ENCOUNTER — Encounter: Payer: Self-pay | Admitting: Internal Medicine

## 2010-04-17 ENCOUNTER — Ambulatory Visit (INDEPENDENT_AMBULATORY_CARE_PROVIDER_SITE_OTHER): Payer: Medicare Other | Admitting: Internal Medicine

## 2010-04-17 ENCOUNTER — Encounter: Payer: Self-pay | Admitting: Internal Medicine

## 2010-04-17 VITALS — BP 140/88 | HR 104 | Temp 98.4°F | Wt 223.8 lb

## 2010-04-17 DIAGNOSIS — M545 Low back pain: Secondary | ICD-10-CM

## 2010-04-17 NOTE — Progress Notes (Signed)
  Subjective:    Patient ID: Todd Larson, male    DOB: [birth year], 70 y.o.   MRN: [id number]  HPI he developed acute lumbar area pain after arising this morning. He employed the exercises as well as the muscle  relaxant & tramadol with gradual response. At his last visit the pain had been actually in the thoracic spine. This is a recurrent issue with sweating in heat.  He had no associated warning signs this morning of numbness or tingling, weakness, or stool or urine incontinence. He has had no associated rash.    Review of Systems     Objective:   Physical Exam he is in no acute distress. The skin is warm and dry with no rash in the area of pain.  His gait is stiff with decreased  normal curvature of the lumbar spine.  He is able to lie back  & sit up  without help. Straight leg raising test is negative.  Deep tendon reflexes are normal as is strength and tone.  He is able to walk on his heels and toes without deficit.        Assessment & Plan:  #1 classic low back syndrome, recurrent. Interestingly he states that this is worse in the heat when he sweats. Conceivably there could be a component of electrolyte imbalance as well  Plan: #1 he can continue using  his present meds for symptoms  . I do recommend long-term stretching exercises such as freestyle swimming  , stretch aerobics  or yoga as preventive measures.  Also would I  recommend considering Chiropractory.  If  sweating  I  recommend drinking Gatorade  Lite  8-16 ounces a day  & considering the supplement Cal/Mag (calcium/magnesium)

## 2010-04-17 NOTE — Patient Instructions (Signed)
Consider all therapeutic options in notes.

## 2010-05-11 ENCOUNTER — Telehealth: Payer: Self-pay | Admitting: *Deleted

## 2010-05-11 MED ORDER — LOSARTAN POTASSIUM 50 MG PO TABS: 50.0000 mg | ORAL_TABLET | Freq: Every day | ORAL | Status: DC

## 2010-05-11 NOTE — Telephone Encounter (Signed)
Pt called for refill on medication also informed that he hasn't had labs done in a while and would like to scheduled just need to know what labs might be needed along w/ diagnosis.

## 2010-05-15 NOTE — Telephone Encounter (Signed)
Per [facility]- BMET, hepatic panel, CBC & dif, A1c, lipids,TSH ( 401.9,530.81GERD,790.29,995.20)   pls schedule for pt

## 2010-05-16 ENCOUNTER — Other Ambulatory Visit: Payer: Self-pay | Admitting: *Deleted

## 2010-05-16 DIAGNOSIS — I10 Essential (primary) hypertension: Secondary | ICD-10-CM

## 2010-05-16 DIAGNOSIS — K219 Gastro-esophageal reflux disease without esophagitis: Secondary | ICD-10-CM

## 2010-05-16 DIAGNOSIS — R7309 Other abnormal glucose: Secondary | ICD-10-CM

## 2010-05-16 DIAGNOSIS — T887XXA Unspecified adverse effect of drug or medicament, initial encounter: Secondary | ICD-10-CM

## 2010-05-17 ENCOUNTER — Other Ambulatory Visit (INDEPENDENT_AMBULATORY_CARE_PROVIDER_SITE_OTHER): Payer: Medicare Other

## 2010-05-17 DIAGNOSIS — R7309 Other abnormal glucose: Secondary | ICD-10-CM

## 2010-05-17 DIAGNOSIS — T887XXA Unspecified adverse effect of drug or medicament, initial encounter: Secondary | ICD-10-CM

## 2010-05-17 DIAGNOSIS — I10 Essential (primary) hypertension: Secondary | ICD-10-CM

## 2010-05-17 DIAGNOSIS — K219 Gastro-esophageal reflux disease without esophagitis: Secondary | ICD-10-CM

## 2010-05-17 LAB — HEPATIC FUNCTION PANEL
AST: 20 U/L (ref 0–37)
Albumin: 3.9 g/dL (ref 3.5–5.2)
Alkaline Phosphatase: 52 U/L (ref 39–117)
Total Bilirubin: 1 mg/dL (ref 0.3–1.2)

## 2010-05-17 LAB — BASIC METABOLIC PANEL
Calcium: 9.3 mg/dL (ref 8.4–10.5)
GFR: 81.84 mL/min (ref >60.00)
Glucose, Bld: 93 mg/dL (ref 70–99)
Potassium: 4.5 mEq/L (ref 3.5–5.1)
Sodium: 141 mEq/L (ref 135–145)

## 2010-05-17 LAB — LIPID PANEL
HDL: 36.7 mg/dL — ABNORMAL LOW (ref >39.00)
LDL Cholesterol: 114 mg/dL — ABNORMAL HIGH (ref 0–99)
Total CHOL/HDL Ratio: 5
Triglycerides: 137 mg/dL (ref 0.0–149.0)

## 2010-05-17 LAB — CBC WITH DIFFERENTIAL/PLATELET
Basophils Absolute: 0.1 10*3/uL (ref 0.0–0.1)
Eosinophils Relative: 4 % (ref 0.0–5.0)
HCT: 51.9 % (ref 39.0–52.0)
Hemoglobin: 18 g/dL — ABNORMAL HIGH (ref 13.0–17.0)
Lymphs Abs: 2.4 10*3/uL (ref 0.7–4.0)
MCV: 90.5 fl (ref 78.0–100.0)
Monocytes Absolute: 0.5 10*3/uL (ref 0.1–1.0)
Monocytes Relative: 6.9 % (ref 3.0–12.0)
Neutro Abs: 4.4 10*3/uL (ref 1.4–7.7)
Platelets: 249 10*3/uL (ref 150.0–400.0)
RDW: 12.9 % (ref 11.5–14.6)

## 2010-05-19 NOTE — Telephone Encounter (Signed)
Patient already came in for labs on 05/17/2010

## 2010-05-19 NOTE — Assessment & Plan Note (Signed)
[facility]                         GASTROENTEROLOGY OFFICE NOTE   NAME:Todd Larson, Todd Larson                          MRN:          [id number]  DATE:01/04/2006                            DOB:          06-14-1940    REFERRING PHYSICIAN:  [name]. [name], MD,FACP,FCCP   REASON FOR REFERRAL:  Mr. [name] asked me to evaluate Todd Larson in  consultation regarding rectal bleeding, hemorrhoids, colorectal cancer  screening.   HISTORY OF PRESENT ILLNESS:  Todd Larson is a very pleasant 70 year old man  who intermittently gets perianal itching.  He said this happens once  every 3-4 months or so.  This happened again around Christmas time a few  weeks ago.  After 2 to 3 days of perianal itching he had some bright red  blood per rectum.  The bleeding was a single episode.  It stained his  underwear and dripped into the toilet.  After that, he had no more  perianal itching.  He was seen by his primary care physician who  performed anoscopy, saw some minor hemorrhoids, put him on  hydrocortisone topical treatments and since then he has had no  recurrence of bleeding or itching.  He tells me he also had a CBC at Dr.  Frederik Pear office and was told that he was not anemic.   He normally does have mild constipation.  He travels a lot and says it  is worse when he travels.  He underwent a full colonoscopy by Dr.  Gloris Manchester in 1998 and was told that he had no colon polyps, no colon  cancers.  He did have diverticulosis.   REVIEW OF SYSTEMS:  Notable for weight gain in the past year, 10 to 15  pounds.  The rest of his review of systems is essentially normal and is  available on his intake nursing sheet.   PAST MEDICAL HISTORY:  BPH, elevated cholesterol, kidney stones,  tachycardia.   CURRENT MEDICATIONS:  1. Omega 3 fatty acids.   ALLERGIES:  No known drug allergies.   SOCIAL HISTORY:  Single, lives alone, works as a Hydrologist,  nonsmoker, drinks alcohol  socially.   FAMILY HISTORY:  No colon cancer, colon polyps in family.   PHYSICAL EXAMINATION:  Five feet 10 inches, 208 pounds.  Blood pressure  130/80, pulse 98.  CONSTITUTIONAL:  Generally well-appearing.  NEUROLOGIC:  Alert and oriented x3.  EYES:  Extraocular movements intact.  MOUTH:  Oral pharynx moist, no lesions.  NECK:  Supple, no lymphadenopathy.  CARDIOVASCULAR:  Heart regular rate and rhythm.  LUNGS:  Clear to auscultation bilaterally.  ABDOMEN:  Soft, nontender, nondistended, normal bowel sounds.  EXTREMITIES:  No lower extremity edema.  SKIN:  No rashes or lesions on visible extremities.   ASSESSMENT AND PLAN:  A 70 year old man at routine risk for colorectal  cancer, recently with minor rectal bleeding and itching consistent with  hemorrhoid.   He last had a colonoscopy approximately 10 years ago and we should  repeat that at his soonest convenience now.  Clinically, this really  sounds like he had a hemorrhoid that  was causing some itching and some  minor irritation and eventually bled and resolved.  He had a CBC in his  primary care physician's office and was told it was essentially normal.  I see no reason for any further blood tests or imaging studies prior to  a colonoscopy which hopefully we will arrange for him for next week.     Rachael Fee, MD  Electronically Signed    DPJ/MedQ  DD: 01/04/2006  DT: 01/04/2006  Job #: [id number]   cc:   [name]. [name], MD,FACP,FCCP

## 2010-05-19 NOTE — Assessment & Plan Note (Signed)
[facility]                        [place] OFFICE NOTE   NAME:[name], [name]                          MRN:          [id number]  DATE:12/24/2005                            DOB:          12/03/1940    Todd Larson was seen emergently December 24, 2005 for rectal bleeding. He  noted slight rectal bleeding December 22 on the tissue and slight  staining in his underwear. He has had some slight irritation and  tenderness at the rectal area and in the context of these symptoms had a  large spot of blood noted on his undergarments. He states otherwise I  feel great; he has not constitutional or other GI  symptoms.   On one occasion, he described the production of blood as bright red and  5-inches long and 1-inch wide.   He states he has actually had rectal bleeding intermittently over 20  years encompassing 5-6 episodes lasting up to for 2-3 days . He had a  colonoscopy in 1998. He has had no followup because of intervening  prostate issues.   Specifically he has had prostate biopsies in 2004. He subsequently had  microwave heat treatments to the prostate for persistently elevated  PSAs.   He has had no abdominal pain and has no history of bleeding dyscrasias.  He was scheduled to fly to [facility] 3 hours after the office visit.  Because of the increasing volume of blood as visualized by him, I told  him that I could not clear him for that flight.   On his exam, there was a small amount of dried blood on his  undergarments which had not been present when he had changed his  undergarments approximately an hour before. At proctoscopy, he had  inflamed tissue with traces of blood. The mucosa appeared slightly  glanular. Internal hemorrhoids were also noted. The visualized stool was  soft and yellow.   Hydrocortisone enemas twice a day were recommended. GI consultation will  be pursued. He was told to go to the emergency room should he have  abdominal  pain or increase in bleeding. He was not admitted for  observation as his vital signs were stable with a pulse of 90, blood  pressure at 125/85 and a normal hemoglobin. The major concern at this  time would be a proctitis and possible Crohn's or ulcerative colitis  process. It is unlikely that the microwave treatments to the prostate  caused any colon mucosal damage. Timing of the colonoscopic evaluation  will be deferred to the gastroenterologic consultant.     [name]. [name], MD,FACP,FCCP  Electronically Signed    [facility]/MedQ  DD: 12/27/2005  DT: 12/27/2005  Job #: [id number]

## 2010-05-19 NOTE — Assessment & Plan Note (Signed)
[facility]                         GASTROENTEROLOGY OFFICE NOTE   NAME:Cardin, MORIO WIDEN                          MRN:          [id number]  DATE:04/02/2006                            DOB:          Nov 11, 1940    PRIMARY CARE PHYSICIAN:  Dr. [name]. Hopper.   GI PROBLEM LIST:  Mildly symptomatic internal hemorrhoids, intermittent  itching, resolved without even topical treatment.   INTERVAL HISTORY:  I last saw Mr. Farler at the time of colonoscopy about  2 months ago.  He had diverticulosis and some medium sized internal  hemorrhoids.  He had had perianal itching that was fairly periodic,  every 3-4 months.  He had never tried any topical treatments for that.  He had one more bout of this since the time of his colonoscopy, lasted  for 24 hours.  He did not try and topical treatments and it resolved on  its own.   CURRENT MEDICATIONS:  Omega-3 fatty acids.   PHYSICAL EXAMINATION:  Weight is 215 pounds which is up 7 pounds since  his last visit.  Blood pressure 120/70, pulse 88.  CONSTITUTIONAL:  Generally well appearing.  ABDOMEN:  Soft nontender, nondistended, normal bowel sounds.   ASSESSMENT/PLAN:  A 70 year old man with mildly symptomatic internal  hemorrhoids.   His hemorrhoids are likely the cause of his intermittent pruritus ani.  He has not tried topical treatments as the itching seemed to resolve on  its own within 24 hours or so.  I recommended that if he has recurrent  itching to first try the topical treatment, such as Preparation H or  AnaMantle.  If he still has problems persisting after that, then he  should give [place] a call.     Rachael Fee, MD  Electronically Signed    DPJ/MedQ  DD: 04/02/2006  DT: 04/02/2006  Job #: [id number]   cc:   [name]. [name], MD,FACP,FCCP

## 2010-05-19 NOTE — Telephone Encounter (Signed)
LMOM to call and schedule lab appt///sph

## 2010-07-06 ENCOUNTER — Ambulatory Visit (INDEPENDENT_AMBULATORY_CARE_PROVIDER_SITE_OTHER): Payer: Medicare Other | Admitting: Internal Medicine

## 2010-07-06 ENCOUNTER — Encounter: Payer: Self-pay | Admitting: Internal Medicine

## 2010-07-06 DIAGNOSIS — R197 Diarrhea, unspecified: Secondary | ICD-10-CM

## 2010-07-06 DIAGNOSIS — R140 Abdominal distension (gaseous): Secondary | ICD-10-CM

## 2010-07-06 DIAGNOSIS — R143 Flatulence: Secondary | ICD-10-CM

## 2010-07-06 DIAGNOSIS — E785 Hyperlipidemia, unspecified: Secondary | ICD-10-CM

## 2010-07-06 MED ORDER — HYOSCYAMINE SULFATE 0.125 MG PO TBDP: 0.1250 mg | ORAL_TABLET | Freq: Four times a day (QID) | ORAL | Status: DC | PRN

## 2010-07-06 NOTE — Progress Notes (Signed)
  Subjective:    Patient ID: Todd Larson, male    DOB: 12/21/1940, 70 y.o.   MRN: [id number]  HPI ABDOMINAL  BLOATING Location: diffuse  Onset: 3-4 pm 07/04   Radiation: no Severity: up to 8 Quality: pressure  Duration: hours  Better with: supine  Worse with: no ( no food triggers except ?milkshake ) Symptoms Nausea/Vomiting: yes, nausea alone  Diarrhea: yes, x3 early this am ; similar event 1 month ago self resolved Constipation: no  Melena/BRBPR: no  Hematemesis: no  Anorexia: no  Fever/Chills: no  Dysuria: no  Rash: no  Wt loss: no  EtOH use: not before episode  NSAIDs/ASA: no  Progression: improved today; stool more formed today  Past Surgeries: negative colonoscopy ? 3-4 years ago       Review of Systems  Recent extensive lab studies were reviewed. His HDL is minimally reduced and his LDL is slightly elevated. He has no family history of premature coronary disease. He has no active cardiopulmonary symptoms     Objective:   Physical Exam  General appearance is one of good health and nourishment w/o distress.  Eyes: No conjunctival inflammation or scleral icterus is present.  Oral exam: Dental hygiene is good; lips and gums are healthy appearing.There is no oropharyngeal erythema or exudate noted.   Neck:  No deformities, thyromegaly, masses, or tenderness noted.   Heart:  Normal rate and regular rhythm. S1 and S2 normal without gallop, murmur, click, or  rub .S4 with slurring  Lungs:Chest clear to auscultation; no wheezes, rhonchi,rales ,or rubs present.No increased work of breathing.   Abdomen: bowel sounds normal, soft and non-tender without masses, organomegaly or hernias noted.  No guarding or rebound   Skin:Warm & dry.  Intact without suspicious lesions or rashes ; no jaundice or tenting Extremities:  No cyanosis, edema, or deformity noted     Lymphatic: No lymphadenopathy is noted about the head, neck, axilla            Assessment & Plan:  #1  abdominal bloating  #2 bile changes with frank diarrhea, self resolved  Plan: Levsin SL as needed for bloating. His bowels are not normal in texture,Align daily would be recommended as needed.

## 2010-07-06 NOTE — Patient Instructions (Signed)
Stay on clear liquids for 48-72 hours or until bowels are normal.This would include  jello, sherbert (NOT ice cream), Lipton's chicken noodle soup(NOT cream based soups),Gatorade Lite, flat Ginger ale (without High Fructose Corn Syrup),dry toast or crackers, baked potato.No milk , dairy or grease until bowels are formed. Align , a Pro Biotic , daily if stools are loose. Immodium AD for frankly watery stool. Report increasing pain, fever or rectal bleeding 

## 2010-08-17 ENCOUNTER — Ambulatory Visit (INDEPENDENT_AMBULATORY_CARE_PROVIDER_SITE_OTHER): Payer: Medicare Other | Admitting: Internal Medicine

## 2010-08-17 ENCOUNTER — Encounter: Payer: Self-pay | Admitting: Internal Medicine

## 2010-08-17 DIAGNOSIS — R509 Fever, unspecified: Secondary | ICD-10-CM

## 2010-08-17 DIAGNOSIS — E785 Hyperlipidemia, unspecified: Secondary | ICD-10-CM

## 2010-08-17 DIAGNOSIS — K6289 Other specified diseases of anus and rectum: Secondary | ICD-10-CM

## 2010-08-17 DIAGNOSIS — K5730 Diverticulosis of large intestine without perforation or abscess without bleeding: Secondary | ICD-10-CM

## 2010-08-17 NOTE — Patient Instructions (Signed)
Please take the probiotic , Align, every day until the bowels are normal. This will replace the normal bacteria which  are necessary for formation of normal stool and processing of food. 

## 2010-08-17 NOTE — Progress Notes (Signed)
  Subjective:    Patient ID: CACHE BILLS, male    DOB: 1940-02-07, 70 y.o.   MRN: [id number]  HPI He has noted rectal area irritation for several days. His similar problem in 2001 after taking Augmentin for a CAT bite. His last colonoscopy was in 2008 &  was negative except for internal hemorrhoids & diverticulosis. He denies fever, diarrhea,chills, sweats, abdominal pain or weight loss. (See  low grade fever today). He denies purulent nasal secretions or any significant sputum. He denies dysuria, pyuria, or hematuria.  He describes 2-3 bowel movements a day; he states he "passes a foot  of a stool" per day. He is using Tucks on the bowel movements.    Review of Systems      Objective:   Physical Exam he is in no acute distress; he is healthy and well-nourished  Is no scleral icterus or jaundice   has no lymphadenopathy about the head, neck, or axilla.  He has no organomegaly or masses. The umbilicus is prominent without significant herniation  There is no tenderness to palpation of the abdomen  Significant erythema is noted around the rectal area without visible  hemorrhoids.        Assessment & Plan:  #1 external perirectal irritation, probably from repeated bowel movements  #2 low-grade fever; no clinical evidence of discrete infection . Plan:Align daily until BMs are normal & decrease the fiber. A & D  ointment to the rectum after each BM.

## 2010-10-03 ENCOUNTER — Ambulatory Visit (INDEPENDENT_AMBULATORY_CARE_PROVIDER_SITE_OTHER): Payer: Medicare Other

## 2010-10-03 DIAGNOSIS — Z23 Encounter for immunization: Secondary | ICD-10-CM

## 2010-10-27 ENCOUNTER — Ambulatory Visit (INDEPENDENT_AMBULATORY_CARE_PROVIDER_SITE_OTHER): Payer: Medicare Other | Admitting: Internal Medicine

## 2010-10-27 ENCOUNTER — Encounter: Payer: Self-pay | Admitting: Internal Medicine

## 2010-10-27 DIAGNOSIS — K5730 Diverticulosis of large intestine without perforation or abscess without bleeding: Secondary | ICD-10-CM

## 2010-10-27 NOTE — Patient Instructions (Signed)
Stop the Align  and use the Metamucil every day with  fluid intake as discussed.

## 2010-10-27 NOTE — Progress Notes (Signed)
  Subjective:    Patient ID: Todd Larson, male    DOB: November 26, 1940, 70 y.o.   MRN: [id number]  HPI ABDOMINAL PAIN: Location: L  inguinal  Onset: 3 days ago   Radiation: no  Severity: up to 4 Quality: tearing  Duration: minutes  Better with: time  Worse with: with straining with BMs  Symptoms Nausea/Vomiting: no  Diarrhea: no but  loose   Constipation: no  Melena/BRBPR: no  Anorexia: no  Fever/Chills: no  Dysuria/hematuria/pyuria: no  Rash: no, but recurrent soreness @ rectum  Wt loss: no  Past Surgeries: colonoscopy 2008:diverticulosis & internal hemorrhoids, Dr Christella Hartigan       Review of Systems  He continues to have recurrent perirectal issues despite using Tucks and Align. He is a high fiber cereals and having high fiber supplement bars.     Objective:   Physical Exam General appearance is one of good health and nourishment w/o distress.  Eyes: No conjunctival inflammation or scleral icterus is present.  Heart:  Normal rate and regular rhythm. S1 and S2 normal without gallop, murmur, click, rub. s 4  Lungs:Chest clear to auscultation; no wheezes, rhonchi,rales ,or rubs present.No increased work of breathing.   Abdomen: bowel sounds normal, soft and non-tender without masses, organomegaly  noted.  No guarding or rebound.  There is minimal erythema around the rectum.  There is slight weakness in the left pubic area without a fixed hernia being present. This is the area of subjective discomfort.  Range of motion of the hips and knees is normal. He has no pain in the inguinal area with external rotation of the hip    Skin:Warm & dry.  Intact without suspicious lesions or rashes ; no jaundice   Lymphatic: No lymphadenopathy is noted about the head, neck, axilla, or inguinal areas.              Assessment & Plan:  #1 direct left inguinal hernia; this is small; it would does not appear to be a surgical situation. It may be aggravated by his straining at  stool  #2 he has perirectal irritation most likely related to the frequent bowel movements in the context of diverticulosis. Clinically there is no evidence of diverticulitis at this time.  Plan: See orders and recommendations. He'll be asked to take Metamucil daily and drink to thirst, up to 40 ounces of fluid per day. He'll be asked to stop the Align  If the inguinal pain persists or progresses CT of the pelvis will be pursued. A second opinion with general surgeon is also an option.

## 2010-12-04 ENCOUNTER — Ambulatory Visit (INDEPENDENT_AMBULATORY_CARE_PROVIDER_SITE_OTHER): Payer: Medicare Other | Admitting: Internal Medicine

## 2010-12-04 ENCOUNTER — Encounter: Payer: Self-pay | Admitting: Internal Medicine

## 2010-12-04 DIAGNOSIS — K6289 Other specified diseases of anus and rectum: Secondary | ICD-10-CM

## 2010-12-04 DIAGNOSIS — K5730 Diverticulosis of large intestine without perforation or abscess without bleeding: Secondary | ICD-10-CM

## 2010-12-04 MED ORDER — HYDROCORTISONE ACE-PRAMOXINE 1-1 % RE FOAM: 1.0000 | Freq: Two times a day (BID) | RECTAL | Status: AC

## 2010-12-04 NOTE — Patient Instructions (Signed)
  Eat a low-fat diet with lots of fruits and vegetables, up to 7-9 servings per day.Drink to thirst , up to 40 oz of water / day

## 2010-12-04 NOTE — Progress Notes (Signed)
  Subjective:    Patient ID: Todd Larson, male    DOB: 07/08/1940, 70 y.o.   MRN: [id number]  HPI RECTAL IRRITATION: Onset: x 2 years   Course: intermittent whenever he has frequent BMs Self-treated with: Metamucil              Improvement with treatment: cleansing in shower; TUCKS & A&D ointment with slow response ovefr 3-5 days. Align not beneficial History Pruritis: occasionally, after BM . Note : no rectal itching @ night; he has 5 cats Tenderness: yes, as "pressure"  Recent travel: no  Red Flags Feeling ill: no  Fever: no  Mouth lesions: no  Facial/tongue swelling/difficulty breathing:  no  Diabetic or immunocompromised: no Last colonoscopy 2008: 3 internal hemorrhoids, Tics      Review of Systems     Objective:   Physical Exam   he is healthy and well-nourished and in no acute distress  He has no lymphadenopathy about the neck or axilla.  His irregular rhythm without significant murmur.  Abdomen is nontender without organomegaly or masses. He does have a ventral hernia &  small umbilical hernia  There is mild erythema around the rectum extending toward the perineum. Rectal exam is unremarkable except for some loose mucoid stool. No internal hemorrhoids were palpable        Assessment & Plan:  #1 perirectal irritation; the most likely etiology is suboptimal evacuation and the need for recurrent cleansing. He is taking subclinical doses of Metamucil, 1 capsule daily. He'll be asked to increase this up to 3 capsules a day. If symptoms persist if this&  ProctoFoam HC as needed for irritation; GI referral will be indicated. The intermittent character of his symptoms mitigate against colitis. He does have 5 cats in the home; I would expect persistent diarrhea and nocturnal pruitis if this were related to parasites

## 2010-12-25 ENCOUNTER — Other Ambulatory Visit: Payer: Self-pay | Admitting: Internal Medicine

## 2010-12-25 DIAGNOSIS — R194 Change in bowel habit: Secondary | ICD-10-CM

## 2010-12-25 MED ORDER — LOSARTAN POTASSIUM 50 MG PO TABS: 50.0000 mg | ORAL_TABLET | Freq: Every day | ORAL | Status: DC

## 2010-12-25 NOTE — Telephone Encounter (Signed)
Spoke with patient. Patient ok with all instruction

## 2010-12-25 NOTE — Telephone Encounter (Signed)
Losartan # 90 , R X 1

## 2010-12-25 NOTE — Telephone Encounter (Signed)
1) Patient is having on going rectal irritation  After speaking with dr hopper - dr hopper said to refer patient to gi - the patient has seen dr Christella Hartigan in the past  2) patient is using proctofoam twice a day but wants to know if he can use a&d ointment after a bowel movement when he doesn't use proctofoam since he has at least 5 bowel movement a day  3) he is taking metamucil & wants to know if he should continue taking it since he has dirrehea

## 2010-12-25 NOTE — Telephone Encounter (Signed)
A&D ointment  can be used after bowel movements. Stop Metamucil if having diarrhea.

## 2010-12-29 ENCOUNTER — Ambulatory Visit: Payer: Medicare Other | Admitting: Internal Medicine

## 2011-01-10 ENCOUNTER — Encounter: Payer: Self-pay | Admitting: Gastroenterology

## 2011-01-10 ENCOUNTER — Ambulatory Visit (INDEPENDENT_AMBULATORY_CARE_PROVIDER_SITE_OTHER): Payer: Medicare Other | Admitting: Gastroenterology

## 2011-01-10 VITALS — BP 136/78 | HR 100 | Ht 69.0 in | Wt 230.0 lb

## 2011-01-10 DIAGNOSIS — L290 Pruritus ani: Secondary | ICD-10-CM | POA: Diagnosis not present

## 2011-01-10 NOTE — Progress Notes (Signed)
Review of gastrointestinal problems:  1. Routine risk for colon cancer, colonoscopy February 2008 found no colon polyps. Next screening examination February 2018  2. Colonic diverticulosis  3. anal hemorrhoids that sometimes itch and bleed in a very minor fashion   HPI: This is a  very pleasant man whom I last saw about 2 years ago.  Whom I last saw bout 2 years ago, was recomme nded to have EGD but he cancelled.  He's had no return of the symptoms.  Today, he explains the anal, rectal irrittation.  High volume stool output, softer than usual stools about one day every 2-4 weeks.  Believes   Tried align. No help.  Eat a lot of dietary fiber and has also tried extra fiber in his diet  Past Medical History  Diagnosis Date  . GERD (gastroesophageal reflux disease)   . PSA elevation     History of   . Diverticulosis of colon     Past Surgical History  Procedure Date  . Tonsillectomy   . Colonoscopy 2008    3 internal hemorrhoids, diverticulosis;Dr.Jacobs  . Prostate biopsy     x 2, once abnormal, once normal    Current Outpatient Prescriptions  Medication Sig Dispense Refill  . cyclobenzaprine (FLEXERIL) 5 MG tablet Take 1 tablet (5 mg total) by mouth as directed. 1 by mouth twice daily and 1-2 at bedtime if needed  30 tablet  20  . diclofenac-misoprostol (ARTHROTEC 75) 75-0.2 MG per tablet Take 1 tablet by mouth 2 (two) times daily as needed.        . dutasteride (AVODART) 0.5 MG capsule Take 0.5 mg by mouth daily.        . hyoscyamine (ANASPAZ) 0.125 MG TBDP Place 1 tablet (0.125 mg total) under the tongue every 6 (six) hours as needed.  30 each  2  . losartan (COZAAR) 50 MG tablet Take 1 tablet (50 mg total) by mouth daily.  90 tablet  1  . traMADol (ULTRAM) 50 MG tablet Take 1 tablet (50 mg total) by mouth every 6 (six) hours as needed.  30 tablet  0    Allergies as of 01/10/2011 - Review Complete 01/10/2011  Allergen Reaction Noted  .  ZOX:[id number]+XBJYNWGNF+AOZHYQMVHQ acid+aspartame      Family History  Problem Relation Age of Onset  . Lung cancer Maternal Grandfather   . Heart attack Paternal Uncle   . Prostate cancer Brother     History   Social History  . Marital Status: Single    Spouse Name: N/A    Number of Children: 0  . Years of Education: N/A   Occupational History  . retired    Social History Main Topics  . Smoking status: Never Smoker   . Smokeless tobacco: Never Used  . Alcohol Use: Yes     social  . Drug Use: No  . Sexually Active: Not on file   Other Topics Concern  . Not on file   Social History Narrative  . No narrative on file      Physical Exam: BP 136/78  Pulse 100  Ht 5\' 9"  (1.753 m)  Wt 230 lb (104.327 kg)  BMI 33.96 kg/m2 Constitutional: generally well-appearing Psychiatric: alert and oriented x3 Abdomen: soft, nontender, nondistended, no obvious ascites, no peritoneal signs, normal bowel sounds Rectal: There is soft stool external to his anus, fecal soilage, no clear external anal hemorrhoids, no obvious anal fissures, no rectal masses distally    Assessment and plan: 71 y.o.  male with intermittent anal itching, intermittent high volume stools  I'm suspicious that he may be taking in too much extra fiber in his diet and so he will decrease all extra fiber and he will call this office in 2 weeks to report on his symptoms.

## 2011-01-10 NOTE — Patient Instructions (Signed)
Cut out all extra fiber in your diet.  Call Dr. Christella Hartigan' in 2-3 weeks to report on your symptoms.

## 2011-02-01 ENCOUNTER — Telehealth: Payer: Self-pay | Admitting: Gastroenterology

## 2011-02-01 NOTE — Telephone Encounter (Signed)
great

## 2011-02-01 NOTE — Telephone Encounter (Signed)
Pt is calling to update on his condition, he did cut back on fiber and he does feel better.  Bowel movements have decreased therefore the anal discomfort has decreased.  He will call back if his symptoms return.

## 2011-02-07 ENCOUNTER — Ambulatory Visit (INDEPENDENT_AMBULATORY_CARE_PROVIDER_SITE_OTHER): Payer: Medicare Other | Admitting: Internal Medicine

## 2011-02-07 ENCOUNTER — Encounter: Payer: Self-pay | Admitting: Internal Medicine

## 2011-02-07 VITALS — BP 124/78 | HR 102 | Temp 98.4°F | Wt 230.8 lb

## 2011-02-07 DIAGNOSIS — M545 Low back pain: Secondary | ICD-10-CM | POA: Diagnosis not present

## 2011-02-07 MED ORDER — TRAMADOL HCL 50 MG PO TABS: 50.0000 mg | ORAL_TABLET | Freq: Four times a day (QID) | ORAL | Status: DC | PRN

## 2011-02-07 NOTE — Patient Instructions (Signed)
The best exercises for the low back include freestyle swimming, stretch aerobics, and yoga.  I recommend you see Dr Christell Faith if pain persists/progresses or is associaled with Warning Signs as discussed.

## 2011-02-07 NOTE — Progress Notes (Signed)
  Subjective:    Patient ID: Todd Larson, male    DOB: 1940-04-02, 71 y.o.   MRN: [id number]  HPI BACK PAIN Onset: 2/1 Location:LS area  Quality: aching Severity: up to 6 with spasms, usually 2-3 Worse with: sitting Better with: stretching exercises;supine position;spasms better with muscle relaxant Radiation: no Trauma: no ; trigger was bending over to clean out cat litter [employer] Fecal/urinary incontinence: no  Numbness/Weakness: numbness R thigh few seconds 2/5  Fever/chills/sweats: no  Night pain: no  PMH of osteoporosis or chronic steroid use: no   He does have a history of bulging disc in the thoracic spine. He did fall and injure lumbosacral spine approximately 2 decades ago.     Review of Systems  He denies any genitourinary symptoms such as dysuria, hematuria, or pyuria. There is no rash or change in color of skin in the area of his discomfort     Objective:   Physical Exam Gen.: Healthy and well-nourished in appearance. Alert, appropriate and cooperative throughout exam. Abdomen: Bowel sounds normal; abdomen soft and nontender. No masses, organomegaly or hernias noted. No flank discomfort to percussion                                                                                Musculoskeletal/extremities: No deformity or scoliosis noted of  the thoracic or lumbar spine. No clubbing, cyanosis, edema, or deformity noted. Range of motion  normal .Tone & strength  normal.Joints normal. Nail health  good. He is able to lie supine and sit up without help. Straight leg raising is negative bilaterally. He has minimal crepitus of the knees. Neurologic: Alert and oriented x3. Deep tendon reflexes symmetrical and normal. Gait is normal including tiptoe and heel walking         Skin: Intact without suspicious lesions or rashes. Psych: Mood and affect are normal. Normally interactive                                                                                           Assessment & Plan:    #1 acute low back syndrome without neuromuscular deficit on exam. Transitory paresthesias right thigh by history  Plan: See orders and recommendations

## 2011-02-16 DIAGNOSIS — R972 Elevated prostate specific antigen [PSA]: Secondary | ICD-10-CM | POA: Diagnosis not present

## 2011-02-21 ENCOUNTER — Telehealth: Payer: Self-pay | Admitting: Gastroenterology

## 2011-02-21 NOTE — Telephone Encounter (Signed)
Pt is calling to update on his anal itching, he cut out all extra fiber and was doing well until this past week when the anal itching returned.  He has been using tucks pads and proctofoam as needed.  He would like to know if there is something else he can do.  Please advise

## 2011-02-22 MED ORDER — PRAMOXINE-HC 1-1 % EX CREA: TOPICAL_CREAM | CUTANEOUS | Status: AC

## 2011-02-22 NOTE — Telephone Encounter (Signed)
Pt has been notified.

## 2011-02-22 NOTE — Telephone Encounter (Signed)
i called in analpram ointment.  Please call him and tell him to try this INSTEAD of the proctofoam for next 2-3 weeks and to call to report on his symptoms

## 2011-02-28 ENCOUNTER — Telehealth: Payer: Self-pay | Admitting: Gastroenterology

## 2011-02-28 NOTE — Telephone Encounter (Signed)
Pt has been scheduled for a f/u on 03/09/11 to discuss ongoing anal pain and irritation.  Pt advised of appt date and time

## 2011-03-09 ENCOUNTER — Ambulatory Visit (INDEPENDENT_AMBULATORY_CARE_PROVIDER_SITE_OTHER): Payer: Medicare Other | Admitting: Gastroenterology

## 2011-03-09 ENCOUNTER — Encounter: Payer: Self-pay | Admitting: Gastroenterology

## 2011-03-09 VITALS — BP 128/72 | HR 92 | Ht 69.0 in | Wt 231.0 lb

## 2011-03-09 DIAGNOSIS — L290 Pruritus ani: Secondary | ICD-10-CM

## 2011-03-09 NOTE — Progress Notes (Signed)
Review of gastrointestinal problems:  1. Routine risk for colon cancer, colonoscopy February 2008 found no colon polyps. Next screening examination February 2018  2. Colonic diverticulosis  3. anal hemorrhoids that sometimes itch and bleed in a very minor fashion   HPI: This is a  very pleasant man whom I last saw one to 2 months ago. He is still bothered by intermittent anal itching. Currently he uses [employer]'s [employer]'s baby shampoo when he washes, showers. He has intermittent constipation.   Past Medical History  Diagnosis Date  . GERD (gastroesophageal reflux disease)   . PSA elevation     History of   . Diverticulosis of colon   . Bulging discs      thoracic spine    Past Surgical History  Procedure Date  . Tonsillectomy   . Colonoscopy 2008    3 internal hemorrhoids, diverticulosis;Dr.Suzannah Bettes  . Prostate biopsy     x 2, once abnormal, once normal    Current Outpatient Prescriptions  Medication Sig Dispense Refill  . dutasteride (AVODART) 0.5 MG capsule Take 0.5 mg by mouth daily.      Marland Kitchen losartan (COZAAR) 50 MG tablet Take 1 tablet (50 mg total) by mouth daily.  90 tablet  1  . pramoxine-hydrocortisone (ANALPRAM HC) cream Apply once to twice daily as needed for anal discomfort  30 g  2    Allergies as of 03/09/2011 - Review Complete 03/09/2011  Allergen Reaction Noted  . ZOX:[id number]+XBJYNWGNF+AOZHYQMVHQ acid+aspartame      Family History  Problem Relation Age of Onset  . Lung cancer Maternal Grandfather   . Heart attack Paternal Uncle   . Prostate cancer Brother     History   Social History  . Marital Status: Single    Spouse Name: N/A    Number of Children: 0  . Years of Education: N/A   Occupational History  . retired    Social History Main Topics  . Smoking status: Never Smoker   . Smokeless tobacco: Never Used  . Alcohol Use: Yes     social  . Drug Use: No  . Sexually Active: Not on file   Other Topics Concern  . Not on file    Social History Narrative  . No narrative on file      Physical Exam: BP 128/72  Pulse 92  Ht 5\' 9"  (1.753 m)  Wt 231 lb (104.781 kg)  BMI 34.11 kg/m2 Constitutional: generally well-appearing Psychiatric: alert and oriented x3 Abdomen: soft, nontender, nondistended, no obvious ascites, no peritoneal signs, normal bowel sounds Rectal examination: Very small external anal hemorrhoids,  soft brown fecal soilage the perianal skin    Assessment and plan: 70 y.o. male with pruritus ani  He will restart fiber supplements at looked relatively low dose. He'll avoid detergents when he washes his backside. He'll begin using Tucks pads with every BM. He will call his office in 4 weeks to report on his symptoms.

## 2011-03-09 NOTE — Patient Instructions (Signed)
Restart Citrucel fiber supplements, the goal is a small spoonful every day. Avoid putting any soaps or detergents on  your back side when you wash. Please use the Tucks pads after every bowel movement.  Call my office to report on her symptoms in 4 weeks

## 2011-03-12 ENCOUNTER — Ambulatory Visit (INDEPENDENT_AMBULATORY_CARE_PROVIDER_SITE_OTHER): Payer: Medicare Other | Admitting: Internal Medicine

## 2011-03-12 ENCOUNTER — Encounter: Payer: Self-pay | Admitting: Internal Medicine

## 2011-03-12 VITALS — BP 148/78 | HR 106 | Temp 98.1°F | Wt 230.2 lb

## 2011-03-12 DIAGNOSIS — R1012 Left upper quadrant pain: Secondary | ICD-10-CM

## 2011-03-12 MED ORDER — HYOSCYAMINE SULFATE 0.125 MG SL SUBL: 0.1250 mg | SUBLINGUAL_TABLET | SUBLINGUAL | Status: DC | PRN

## 2011-03-12 NOTE — Patient Instructions (Signed)
Assess response to as needed medication

## 2011-03-12 NOTE — Progress Notes (Signed)
  Subjective:    Patient ID: Todd Larson, male    DOB: 16-Aug-1940, 71 y.o.   MRN: [id number]  HPI ABDOMINAL PAIN: Location: LUQ  Onset: 1 mo ago; 2nd on 3/2 ; 3rd on 3/5 w/o trigger or injury  Radiation: to back with #3  Severity: up to 7 Quality: spasm  Duration: 5-10 seconds  Better with:no relievers  Worse with: no trigger Symptoms Nausea/Vomiting: no  Diarrhea: no Constipation:no  Melena/BRBPR: no  Hematemesis: no  Anorexia: no Fever/Chills: no  Rash: no Wt loss: no  Past Surgeries: colonoscopy 2008 : diverticulosis       Review of Systems Dr. Christella Hartigan is treating his chronic bowel issues with Citrucel     Objective:   Physical Exam General appearance is one of good health and nourishment w/o distress.  Eyes: No conjunctival inflammation or scleral icterus is present.  Oral exam: Dental hygiene is good; lips and gums are healthy appearing.There is no oropharyngeal erythema or exudate noted.   Heart:  Normal rate and regular rhythm. S1 and S2 normal without gallop, murmur, click, rub or other extra sounds     Lungs:Chest clear to auscultation; no wheezes, rhonchi,rales ,or rubs present.No increased work of breathing; there is no discomfort with thoracic compression or palpation  Abdomen: bowel sounds normal, soft and non-tender without masses, organomegaly  noted.  No guarding or rebound . Ventral and umbilical hernias present with straining  Skin:Warm & dry.  Intact without suspicious lesions or rashes ; no jaundice or tenting  Lymphatic: No lymphadenopathy is noted about the head, neck, axilla areas.              Assessment & Plan:    #1 intermittent, very brief left upper quadrant abdominal pain. Bowel spasm is suggested; this may be a variant of irritable bowel. His past history of diverticulosis but clinically diverticulitis is not suspect  Plan: See orders and recommendations

## 2011-03-14 ENCOUNTER — Telehealth: Payer: Self-pay | Admitting: Gastroenterology

## 2011-03-14 NOTE — Telephone Encounter (Signed)
Pt was given the following  citrucel instructions:   Please start using citrucel one tablespoon daily, you may increase to twice daily as needed.  Pt thanked me for calling.

## 2011-04-12 ENCOUNTER — Telehealth: Payer: Self-pay | Admitting: Gastroenterology

## 2011-04-12 DIAGNOSIS — H10029 Other mucopurulent conjunctivitis, unspecified eye: Secondary | ICD-10-CM | POA: Diagnosis not present

## 2011-04-12 NOTE — Telephone Encounter (Signed)
Pt has had one episode of anal pain.  Had loose stool on Citrucel and stopped the citrucel and got constipated after 2 days and the pain returned around the rectum.  He is back on the Citrucel and has started with the loose stools again.  He is currently taking a heaping teaspoon.  Please advise.

## 2011-04-13 NOTE — Telephone Encounter (Signed)
Pt has been notified and he will call with an update

## 2011-04-13 NOTE — Telephone Encounter (Signed)
He should cut back to 1/2 of his current citrucel dose.  Give this new dose 2-3 weeks and then he should call to report on his symptoms

## 2011-05-04 DIAGNOSIS — H10029 Other mucopurulent conjunctivitis, unspecified eye: Secondary | ICD-10-CM | POA: Diagnosis not present

## 2011-05-12 DIAGNOSIS — H10029 Other mucopurulent conjunctivitis, unspecified eye: Secondary | ICD-10-CM | POA: Diagnosis not present

## 2011-05-26 DIAGNOSIS — H10029 Other mucopurulent conjunctivitis, unspecified eye: Secondary | ICD-10-CM | POA: Diagnosis not present

## 2011-06-08 ENCOUNTER — Telehealth: Payer: Self-pay | Admitting: Gastroenterology

## 2011-06-08 NOTE — Telephone Encounter (Signed)
Pt called to say he has some rectal irritation about every few weeks, it is better but not gone.  He is trying to alter his diet and not eat any peanuts.  His constipation has improved with miralax.  He wants to know if he should try anything else?

## 2011-06-11 NOTE — Telephone Encounter (Signed)
Needs rov to discuss

## 2011-06-11 NOTE — Telephone Encounter (Signed)
Pt has been scheduled for 06/25/11

## 2011-06-13 DIAGNOSIS — H10029 Other mucopurulent conjunctivitis, unspecified eye: Secondary | ICD-10-CM | POA: Diagnosis not present

## 2011-06-25 ENCOUNTER — Ambulatory Visit (INDEPENDENT_AMBULATORY_CARE_PROVIDER_SITE_OTHER): Payer: Medicare Other | Admitting: Gastroenterology

## 2011-06-25 ENCOUNTER — Encounter: Payer: Self-pay | Admitting: Gastroenterology

## 2011-06-25 VITALS — BP 132/76 | HR 88 | Ht 69.0 in | Wt 230.0 lb

## 2011-06-25 DIAGNOSIS — L290 Pruritus ani: Secondary | ICD-10-CM

## 2011-06-25 NOTE — Patient Instructions (Addendum)
Apply talc powder (baby powder) to perianal skin after showering every day. Increase fiber supplement to 1tbspn daily. Call in 4-5 weeks.

## 2011-06-25 NOTE — Progress Notes (Signed)
Review of gastrointestinal problems:  1. Routine risk for colon cancer, colonoscopy February 2008 found no colon polyps. Next screening examination February 2018  2. Colonic diverticulosis  3. anal hemorrhoids that sometimes itch and bleed in a very minor fashion   HPI: This is a very pleasant 71 year old man whom I last saw about 3-4 months ago.  About every month or 6 weeks, raw, itching that lasts for 2-3 days at a time.    Currently takes 1tsp of citrucel every day.  Feels like he will have segments of stool.   He sweats a lot,    Never drinks caffeine.    Uses tucks baby wipes.  And toilett paper.    Past Medical History  Diagnosis Date  . GERD (gastroesophageal reflux disease)   . PSA elevation     History of   . Diverticulosis of colon   . Bulging discs      thoracic spine    Past Surgical History  Procedure Date  . Tonsillectomy   . Colonoscopy 2008    3 internal hemorrhoids, diverticulosis;Dr.Serenitie Vinton  . Prostate biopsy     x 2, once abnormal, once normal    Current Outpatient Prescriptions  Medication Sig Dispense Refill  . dutasteride (AVODART) 0.5 MG capsule Take 0.5 mg by mouth daily.      . hyoscyamine (ANASPAZ) 0.125 MG TBDP Place 0.125 mg under the tongue every 4 (four) hours as needed.      Marland Kitchen losartan (COZAAR) 50 MG tablet Take 1 tablet (50 mg total) by mouth daily.  90 tablet  1  . pramoxine-hydrocortisone (ANALPRAM HC) cream Apply once to twice daily as needed for anal discomfort  30 g  2    Allergies as of 06/25/2011 - Review Complete 06/25/2011  Allergen Reaction Noted  . Amoxicillin-pot clavulanate      Family History  Problem Relation Age of Onset  . Lung cancer Maternal Grandfather   . Heart attack Paternal Uncle   . Prostate cancer Brother     History   Social History  . Marital Status: Single    Spouse Name: N/A    Number of Children: 0  . Years of Education: N/A   Occupational History  . retired    Social History Main  Topics  . Smoking status: Never Smoker   . Smokeless tobacco: Never Used  . Alcohol Use: Yes     social  . Drug Use: No  . Sexually Active: Not on file   Other Topics Concern  . Not on file   Social History Narrative  . No narrative on file      Physical Exam: BP 132/76  Pulse 88  Ht 5\' 9"  (1.753 m)  Wt 230 lb (104.327 kg)  BMI 33.96 kg/m2 Constitutional: generally well-appearing Psychiatric: alert and oriented x3 Abdomen: soft, nontender, nondistended, no obvious ascites, no peritoneal signs, normal bowel sounds     Assessment and plan: 71 y.o. male with  perianal itching intermittently  this has been a problem for many months, perhaps even longer. All I have ever seen on examination with small hemorrhoids. He does admit to being quite sweaty off and. Today I recommended that he increase his fiber a bit and applied talc, baby powder after he drives off showering to his perianal skin. He will call to report if symptoms in 4-5 weeks.

## 2011-07-06 ENCOUNTER — Other Ambulatory Visit: Payer: Self-pay | Admitting: Internal Medicine

## 2011-08-16 ENCOUNTER — Ambulatory Visit (INDEPENDENT_AMBULATORY_CARE_PROVIDER_SITE_OTHER): Payer: Medicare Other | Admitting: Family Medicine

## 2011-08-16 ENCOUNTER — Encounter: Payer: Self-pay | Admitting: Family Medicine

## 2011-08-16 VITALS — BP 150/86 | HR 96 | Resp 18 | Wt 230.8 lb

## 2011-08-16 DIAGNOSIS — D229 Melanocytic nevi, unspecified: Secondary | ICD-10-CM

## 2011-08-16 DIAGNOSIS — D239 Other benign neoplasm of skin, unspecified: Secondary | ICD-10-CM | POA: Diagnosis not present

## 2011-08-16 DIAGNOSIS — M549 Dorsalgia, unspecified: Secondary | ICD-10-CM | POA: Diagnosis not present

## 2011-08-16 MED ORDER — CYCLOBENZAPRINE HCL 5 MG PO TABS: 5.0000 mg | ORAL_TABLET | ORAL | Status: DC

## 2011-08-16 MED ORDER — TRAMADOL HCL 50 MG PO TABS: 50.0000 mg | ORAL_TABLET | Freq: Three times a day (TID) | ORAL | Status: AC | PRN

## 2011-08-16 NOTE — Patient Instructions (Addendum)
Thoracic Strain You have injured the muscles or tendons that attach to the upper part of your back behind your chest. This injury is called a thoracic strain, thoracic sprain, or mid-back strain.  CAUSES  The cause of thoracic strain varies. A less severe injury involves pulling a muscle or tendon without tearing it. A more severe injury involves tearing (rupturing) a muscle or tendon. With less severe injuries, there may be little loss of strength. Sometimes, there are breaks (fractures) in the bones to which the muscles are attached. These fractures are rare, unless there was a direct hit (trauma) or you have weak bones due to osteoporosis or age. Longstanding strains may be caused by overuse or improper form during certain movements. Obesity can also increase your risk for back injuries. Sudden strains may occur due to injury or not warming up properly before exercise. Often, there is no obvious cause for a thoracic strain. SYMPTOMS  The main symptom is pain, especially with movement, such as during exercise. DIAGNOSIS  Your caregiver can usually tell what is wrong by taking an X-ray and doing a physical exam. TREATMENT   Physical therapy may be helpful for recovery. Your caregiver can give you exercises to do or refer you to a physical therapist after your pain improves.   After your pain improves, strengthening and conditioning programs appropriate for your sport or occupation may be helpful.   Always warm up before physical activities or athletics. Stretching after physical activity may also help.   Certain over-the-counter medicines may also help. Ask your caregiver if there are medicines that would help you.  If this is your first thoracic strain injury, proper care and proper healing time before starting activities should prevent long-term problems. Torn ligaments and tendons require as long to heal as broken bones. Average healing times may be only 1 week for a mild strain. For torn  muscles and tendons, healing time may be up to 6 weeks to 2 months. HOME CARE INSTRUCTIONS   Apply ice to the injured area. Ice massages may also be used as directed.   Put ice in a plastic bag.   Place a towel between your skin and the bag.   Leave the ice on for 15 to 20 minutes, 3 to 4 times a day, for the first 2 days.   Only take over-the-counter or prescription medicines for pain, discomfort, or fever as directed by your caregiver.   Keep your appointments for physical therapy if this was prescribed.   Use wraps and back braces as instructed.  SEEK IMMEDIATE MEDICAL CARE IF:   You have an increase in bruising, swelling, or pain.   Your pain has not improved with medicines.   You develop new shortness of breath, chest pain, or fever.   Problems seem to be getting worse rather than better.  MAKE SURE YOU:   Understand these instructions.   Will watch your condition.   Will get help right away if you are not doing well or get worse.  Document Released: 03/10/2003 Document Revised: 12/07/2010 Document Reviewed: 02/03/2010 [employer] Patient Information 2012 [employer]. 

## 2011-08-16 NOTE — Progress Notes (Signed)
  Subjective:    Todd Larson is a 71 y.o. male who presents for evaluation of mid back pain. The patient has had last episode over 20 years ago. Symptoms have been present for 5 days and are gradually worsening.  Onset was related to / precipitated by reaching over to lift 15 lb cat. The pain is located in the interscapular area and radiates to the left arm. The pain is described as aching and occurs intermittently. He rates his pain as a 5 on a scale of 0-10. Symptoms are exacerbated by stretching. Symptoms are improved by NSAIDs. He has also tried nothing which provided no symptom relief. He has no other symptoms associated with the back pain. The patient has no "red flag" history indicative of complicated back pain.  The following portions of the patient's history were reviewed and updated as appropriate: allergies, current medications, past family history, past medical history, past social history, past surgical history and problem list.  Review of Systems Pertinent items are noted in HPI.    Objective:   Full range of motion without pain, no tenderness, no spasm, no curvature. Inspection and palpation: inspection of back is normal.    Assessment:    thoracic back pain  Plan:    Regular aerobic and trunk strengthening exercises discussed. Short (2-4 day) period of relative rest recommended until acute symptoms improve. Ice to affected area as needed for local pain relief. Heat to affected area as needed for local pain relief. Muscle relaxants per medication orders. refill ultram  F/u prn

## 2011-08-17 DIAGNOSIS — R972 Elevated prostate specific antigen [PSA]: Secondary | ICD-10-CM | POA: Diagnosis not present

## 2011-08-20 ENCOUNTER — Encounter: Payer: Self-pay | Admitting: Internal Medicine

## 2011-08-20 ENCOUNTER — Ambulatory Visit (INDEPENDENT_AMBULATORY_CARE_PROVIDER_SITE_OTHER): Payer: Medicare Other | Admitting: Internal Medicine

## 2011-08-20 VITALS — BP 138/80 | HR 110 | Temp 98.8°F | Wt 228.6 lb

## 2011-08-20 DIAGNOSIS — M546 Pain in thoracic spine: Secondary | ICD-10-CM | POA: Diagnosis not present

## 2011-08-20 NOTE — Progress Notes (Signed)
  Subjective:    Patient ID: Todd Larson, male    DOB: [birth year], 71 y.o.   MRN: [id number]  HPI He describes an aching discomfort in the mid, upper thoracic spine area since 8/9-10. He questions whether the triggers may have been sleeping at an odd angle or moving one of his pet cats in  the middle of the night on the bed.  Tramadol and cyclobenzaprine at bedtime result in significant resolution of symptoms; but the symptoms recur during the day.  He has intermittent aching in the left upper extremity as well.  In the 1980s he received physical therapy for several months for similar symptoms in the context of traveling and carrying luggage over the shoulder. He questioned 2 partially herniated discs in this area as etiology   Review of Systems Fecal/urinary incontinence: no Numbness/Weakness: ? Numbness single finger which lasted few seconds only w/o recurrence Fever/chills/sweats:no  Night pain:no  Unexplained weight loss: no PMH of osteoporosis or chronic steroid use: no        Objective:   Physical Exam  He appears healthy and well-nourished in no acute distress  There is no lymphadenopathy about the neck or axilla.  No skin lesions are noted over the posterior thorax.  Musculoskeletal/Extremities: Neck is supple with full range of motion. Gait (including heel & tip toe walking)and station; range of motion; stability; muscle strength; and  muscle tone are normal.  There are  no significant deformities of the digits. He is able to lie flat and sit up without help. Straight leg raising is normal bilaterally. No clubbing, cyanosis, edema, or deformity noted. Extremity range of motion  normal .Tone & strength  normal.Joints normal. Nail health  good.         Assessment & Plan:  #1 upper back pain with some left upper extremity symptoms; possible C5-6, T1 radiculopathy. No neuromuscular deficit on exam  Plan: See orders and recommendations

## 2011-08-20 NOTE — Patient Instructions (Addendum)
Use a cervical memory foam pillow to prevent hyperextension or hyperflexion of the cervical spine. 

## 2011-08-22 ENCOUNTER — Ambulatory Visit: Payer: Medicare Other | Attending: Internal Medicine | Admitting: Physical Therapy

## 2011-08-22 DIAGNOSIS — M2569 Stiffness of other specified joint, not elsewhere classified: Secondary | ICD-10-CM | POA: Insufficient documentation

## 2011-08-22 DIAGNOSIS — M546 Pain in thoracic spine: Secondary | ICD-10-CM | POA: Diagnosis not present

## 2011-08-22 DIAGNOSIS — M542 Cervicalgia: Secondary | ICD-10-CM | POA: Diagnosis not present

## 2011-08-22 DIAGNOSIS — IMO0001 Reserved for inherently not codable concepts without codable children: Secondary | ICD-10-CM | POA: Diagnosis not present

## 2011-08-24 ENCOUNTER — Ambulatory Visit: Payer: Medicare Other | Admitting: Physical Therapy

## 2011-08-24 ENCOUNTER — Telehealth: Payer: Self-pay | Admitting: *Deleted

## 2011-08-24 NOTE — Telephone Encounter (Signed)
I informed patient of Dr.Hopper's response

## 2011-08-24 NOTE — Telephone Encounter (Signed)
Pt left msg on triage vmail stating that he was prescribed Tramadol & Flexeril. Pt states that the Tramadol is not helping & causing constipation. Pt would like to know if he can go back to taking Advil along with the Flexeril. Please advise.

## 2011-08-24 NOTE — Telephone Encounter (Signed)
Yes but only with food; also can try 1/2 Tramadol instead of 1 whole pill

## 2011-08-28 ENCOUNTER — Ambulatory Visit: Payer: Medicare Other | Admitting: Physical Therapy

## 2011-08-28 DIAGNOSIS — D0460 Carcinoma in situ of skin of unspecified upper limb, including shoulder: Secondary | ICD-10-CM | POA: Diagnosis not present

## 2011-08-28 DIAGNOSIS — L570 Actinic keratosis: Secondary | ICD-10-CM | POA: Diagnosis not present

## 2011-08-28 DIAGNOSIS — D235 Other benign neoplasm of skin of trunk: Secondary | ICD-10-CM | POA: Diagnosis not present

## 2011-08-31 ENCOUNTER — Ambulatory Visit: Payer: Medicare Other | Admitting: Physical Therapy

## 2011-09-05 ENCOUNTER — Ambulatory Visit: Payer: Medicare Other | Attending: Internal Medicine | Admitting: Physical Therapy

## 2011-09-05 DIAGNOSIS — M2569 Stiffness of other specified joint, not elsewhere classified: Secondary | ICD-10-CM | POA: Insufficient documentation

## 2011-09-05 DIAGNOSIS — IMO0001 Reserved for inherently not codable concepts without codable children: Secondary | ICD-10-CM | POA: Insufficient documentation

## 2011-09-05 DIAGNOSIS — M546 Pain in thoracic spine: Secondary | ICD-10-CM | POA: Diagnosis not present

## 2011-09-05 DIAGNOSIS — M542 Cervicalgia: Secondary | ICD-10-CM | POA: Diagnosis not present

## 2011-09-07 ENCOUNTER — Telehealth: Payer: Self-pay | Admitting: Gastroenterology

## 2011-09-07 NOTE — Telephone Encounter (Signed)
FYI:  Pt is feeling great and has no GI complaints.

## 2011-09-07 NOTE — Telephone Encounter (Signed)
great

## 2011-09-10 ENCOUNTER — Ambulatory Visit: Payer: Medicare Other | Admitting: Physical Therapy

## 2011-09-14 ENCOUNTER — Ambulatory Visit: Payer: Medicare Other | Admitting: Physical Therapy

## 2011-09-17 ENCOUNTER — Ambulatory Visit: Payer: Medicare Other | Admitting: Physical Therapy

## 2011-09-21 ENCOUNTER — Ambulatory Visit: Payer: Medicare Other | Admitting: Physical Therapy

## 2011-09-25 ENCOUNTER — Ambulatory Visit: Payer: Medicare Other | Admitting: Physical Therapy

## 2011-09-27 ENCOUNTER — Ambulatory Visit: Payer: Medicare Other | Admitting: Physical Therapy

## 2011-10-01 ENCOUNTER — Ambulatory Visit: Payer: Medicare Other | Admitting: Physical Therapy

## 2011-10-01 DIAGNOSIS — IMO0001 Reserved for inherently not codable concepts without codable children: Secondary | ICD-10-CM | POA: Diagnosis not present

## 2011-10-01 DIAGNOSIS — M542 Cervicalgia: Secondary | ICD-10-CM | POA: Diagnosis not present

## 2011-10-01 DIAGNOSIS — M2569 Stiffness of other specified joint, not elsewhere classified: Secondary | ICD-10-CM | POA: Diagnosis not present

## 2011-10-01 DIAGNOSIS — M546 Pain in thoracic spine: Secondary | ICD-10-CM | POA: Diagnosis not present

## 2011-10-02 ENCOUNTER — Telehealth: Payer: Self-pay | Admitting: Gastroenterology

## 2011-10-03 NOTE — Telephone Encounter (Signed)
Pt was constipated because he was on medications for a back strain.  His anal itching returned for a few days and he adjusted his fiber intake and slowly the itching is improving.  He will continue to control his fiber and keep the anal are clean and call back next week if he does not continue to improve

## 2011-10-04 ENCOUNTER — Ambulatory Visit: Payer: Medicare Other | Attending: Internal Medicine | Admitting: Physical Therapy

## 2011-10-04 DIAGNOSIS — M2569 Stiffness of other specified joint, not elsewhere classified: Secondary | ICD-10-CM | POA: Insufficient documentation

## 2011-10-04 DIAGNOSIS — M542 Cervicalgia: Secondary | ICD-10-CM | POA: Diagnosis not present

## 2011-10-04 DIAGNOSIS — M546 Pain in thoracic spine: Secondary | ICD-10-CM | POA: Diagnosis not present

## 2011-10-04 DIAGNOSIS — IMO0001 Reserved for inherently not codable concepts without codable children: Secondary | ICD-10-CM | POA: Insufficient documentation

## 2011-10-08 ENCOUNTER — Ambulatory Visit: Payer: Medicare Other | Admitting: Physical Therapy

## 2011-10-11 ENCOUNTER — Ambulatory Visit: Payer: Medicare Other | Admitting: Physical Therapy

## 2011-10-19 DIAGNOSIS — H10029 Other mucopurulent conjunctivitis, unspecified eye: Secondary | ICD-10-CM | POA: Diagnosis not present

## 2011-10-23 ENCOUNTER — Ambulatory Visit (INDEPENDENT_AMBULATORY_CARE_PROVIDER_SITE_OTHER): Payer: Medicare Other

## 2011-10-23 DIAGNOSIS — Z23 Encounter for immunization: Secondary | ICD-10-CM

## 2011-11-05 ENCOUNTER — Telehealth: Payer: Self-pay | Admitting: Gastroenterology

## 2011-11-05 NOTE — Telephone Encounter (Signed)
Pt has had anal itching return after he had 5 bowel movements yesterday.   The first movement was normal but the following stools were very loose and irritating.   He is on 1 tsp fiber daily and is using talc and keeping clean.  What else can he try?

## 2011-11-05 NOTE — Telephone Encounter (Signed)
Pt aware and will call back in a week to report on symptoms

## 2011-11-05 NOTE — Telephone Encounter (Signed)
He should continue with his current regimen and give it some time to resolve.  Should call back in 1 week to report on symptoms.

## 2011-11-16 ENCOUNTER — Telehealth: Payer: Self-pay | Admitting: Gastroenterology

## 2011-11-16 NOTE — Telephone Encounter (Signed)
Pt itching has resolved he will call back if symptoms return

## 2011-11-16 NOTE — Telephone Encounter (Signed)
Left message on machine to call back  

## 2011-11-18 DIAGNOSIS — H04129 Dry eye syndrome of unspecified lacrimal gland: Secondary | ICD-10-CM | POA: Diagnosis not present

## 2011-12-10 ENCOUNTER — Encounter: Payer: Self-pay | Admitting: Internal Medicine

## 2011-12-10 ENCOUNTER — Ambulatory Visit (INDEPENDENT_AMBULATORY_CARE_PROVIDER_SITE_OTHER)
Admission: RE | Admit: 2011-12-10 | Discharge: 2011-12-10 | Disposition: A | Discharge status: Home / Self Care | Payer: Medicare Other | Source: Ambulatory Visit | Attending: Internal Medicine | Admitting: Internal Medicine

## 2011-12-10 ENCOUNTER — Ambulatory Visit (INDEPENDENT_AMBULATORY_CARE_PROVIDER_SITE_OTHER): Payer: Medicare Other | Admitting: Internal Medicine

## 2011-12-10 VITALS — BP 126/78 | HR 102 | Temp 98.2°F | Wt 228.0 lb

## 2011-12-10 DIAGNOSIS — M5412 Radiculopathy, cervical region: Secondary | ICD-10-CM | POA: Diagnosis not present

## 2011-12-10 DIAGNOSIS — M5030 Other cervical disc degeneration, unspecified cervical region: Secondary | ICD-10-CM | POA: Diagnosis not present

## 2011-12-10 NOTE — Progress Notes (Signed)
  Subjective:    Patient ID: Todd Larson, male    DOB: 13-May-1940, 71 y.o.   MRN: [id number]  HPI BACK PAIN: Onset: 12/05/11 Location: upper back  Quality: aching  Worse with: no trigger  Better with: supine & with tramadol & Flexeril Radiation: to LUE to forearm , mainly upper arm Trauma: not specifically Unexplained weight loss: no h/o cancer/immunosuppression: no PMH of osteoporosis or chronic steroid use:no  He had a similar problem in August after lifting a cat in his bedroom. He took physical therapy for 8 weeks with response. He has continued the exercises 3 times a week now w/o benefit      Review of Systems Fecal/urinary incontinence: no Numbness/Weakness:no Fever/chills/sweats: no      Objective:   Physical Exam Gen.:  well-nourished in appearance. Alert, appropriate and cooperative throughout exam. Head: Normocephalic without obvious abnormalities Eyes: No corneal or conjunctival inflammation noted. Pupils equal round reactive to light and accommodation.   Neck: No deformities, masses, or tenderness noted. Range of motion significantly decreased in all areas Lungs: Normal respiratory effort; chest expands symmetrically. Lungs are clear to auscultation without rales, wheezes, or increased work of breathing.                                                                               Musculoskeletal/extremities: Accentuated curvature of upper thoracic  spine.  No clubbing, cyanosis, edema, or deformity noted. Tone & strength  normal.Joints normal. Nail health  good. Vascular: Carotid & radial artery pulses are full and equal. No bruits present. Neurologic: Alert and oriented x3. Deep tendon reflexes symmetrical and normal.          Skin: Intact without suspicious lesions or rashes. Lymph: No cervical, axillary lymphadenopathy present. Psych: Mood and affect are normal. Normally interactive                                                                                         Assessment & Plan:  #1 cervical radiculopathy; no neuromuscular deficit present  Plan: See orders and recommendations. Because of recurrence after 4 months  & failure to respond to physical therapy; imaging will be performed

## 2011-12-10 NOTE — Patient Instructions (Addendum)
Review and correct the record as indicated. Please share record with all medical staff seen.  Use an anti-inflammatory cream such as Aspercreme or Zostrix cream twice a day to the affected area  as needed. In lieu of this warm moist compresses or  hot water bottle can be used. Do not apply ice. Order for x-rays entered into  the computer; these will be performed at [address] [facility]. across from [facility]. No appointment is necessary.

## 2011-12-14 ENCOUNTER — Telehealth: Payer: Self-pay | Admitting: *Deleted

## 2011-12-14 DIAGNOSIS — M5412 Radiculopathy, cervical region: Secondary | ICD-10-CM

## 2011-12-14 NOTE — Telephone Encounter (Signed)
Pt left VM still c/o ache in back and arm. Pt is requesting to move on to the next step. Per X-ray recommend Physical Therapy, Chiropractor or non operative Orthopedic Back Specialist consultation. Spoke with Pt who indicated that he would prefer PT. Pt aware referral place awaiting appt info.

## 2011-12-20 ENCOUNTER — Telehealth: Payer: Self-pay | Admitting: *Deleted

## 2011-12-20 NOTE — Telephone Encounter (Signed)
Pt called to report that he is now having numbness in tip of finger on left hand.Pt notes that numbness comes and goes. Pt indicated that he can not start therapy until 12-31-11. Pt has been doing 4 of the tramadol a day and 1 tab of flexeril at night.

## 2011-12-20 NOTE — Telephone Encounter (Signed)
The symptoms suggests that you have had compression of the nerves on either side of the finger. There is no concern unless it progresses up the finger toward the body was associated with change in color or temperature of the finger.

## 2011-12-20 NOTE — Telephone Encounter (Signed)
Discussed with pt. He states that at the time the numbness is just staying in his finger & he will let us know if anything changes or gets worse.

## 2011-12-31 ENCOUNTER — Ambulatory Visit: Payer: Medicare Other | Attending: Internal Medicine | Admitting: Physical Therapy

## 2011-12-31 DIAGNOSIS — M546 Pain in thoracic spine: Secondary | ICD-10-CM | POA: Diagnosis not present

## 2011-12-31 DIAGNOSIS — IMO0001 Reserved for inherently not codable concepts without codable children: Secondary | ICD-10-CM | POA: Diagnosis not present

## 2011-12-31 DIAGNOSIS — M542 Cervicalgia: Secondary | ICD-10-CM | POA: Diagnosis not present

## 2011-12-31 DIAGNOSIS — M2569 Stiffness of other specified joint, not elsewhere classified: Secondary | ICD-10-CM | POA: Insufficient documentation

## 2012-01-04 ENCOUNTER — Ambulatory Visit: Payer: Medicare Other | Attending: Internal Medicine | Admitting: Physical Therapy

## 2012-01-04 ENCOUNTER — Telehealth: Payer: Self-pay | Admitting: *Deleted

## 2012-01-04 DIAGNOSIS — M546 Pain in thoracic spine: Secondary | ICD-10-CM | POA: Diagnosis not present

## 2012-01-04 DIAGNOSIS — M542 Cervicalgia: Secondary | ICD-10-CM | POA: Diagnosis not present

## 2012-01-04 DIAGNOSIS — IMO0001 Reserved for inherently not codable concepts without codable children: Secondary | ICD-10-CM | POA: Insufficient documentation

## 2012-01-04 DIAGNOSIS — M2569 Stiffness of other specified joint, not elsewhere classified: Secondary | ICD-10-CM | POA: Diagnosis not present

## 2012-01-04 NOTE — Telephone Encounter (Signed)
Pt states that he saw PT twice this week and they determine that the numbness in the finger was due to a pinch nerve. Pt indicated that his treatment for that will be a series of stretching exercises. Pt also note triceps weakness which they are going to treat with traction of the neck. Pt just wanted to update Dr [name] on his condition and treatment. Pt will have PT to send over OV corresponded as well with treatment plans.

## 2012-01-08 ENCOUNTER — Ambulatory Visit: Payer: Medicare Other | Admitting: Physical Therapy

## 2012-01-10 ENCOUNTER — Other Ambulatory Visit: Payer: Self-pay | Admitting: Internal Medicine

## 2012-01-11 ENCOUNTER — Ambulatory Visit: Payer: Medicare Other | Admitting: Physical Therapy

## 2012-01-11 DIAGNOSIS — Z79899 Other long term (current) drug therapy: Secondary | ICD-10-CM | POA: Diagnosis not present

## 2012-01-11 NOTE — Telephone Encounter (Signed)
Patient aware Controlled Substance Contract to be sign and rx to be picked up   

## 2012-01-14 ENCOUNTER — Encounter: Payer: Self-pay | Admitting: Lab

## 2012-01-15 ENCOUNTER — Ambulatory Visit: Payer: Medicare Other | Admitting: Physical Therapy

## 2012-01-15 ENCOUNTER — Ambulatory Visit (INDEPENDENT_AMBULATORY_CARE_PROVIDER_SITE_OTHER): Payer: Medicare Other | Admitting: Internal Medicine

## 2012-01-15 ENCOUNTER — Encounter: Payer: Self-pay | Admitting: Internal Medicine

## 2012-01-15 VITALS — BP 120/78 | HR 112 | Temp 98.2°F | Wt 224.4 lb

## 2012-01-15 DIAGNOSIS — H1010 Acute atopic conjunctivitis, unspecified eye: Secondary | ICD-10-CM | POA: Diagnosis not present

## 2012-01-15 MED ORDER — FLUTICASONE PROPIONATE 50 MCG/ACT NA SUSP: 1.0000 | Freq: Two times a day (BID) | NASAL | Status: DC | PRN

## 2012-01-15 NOTE — Patient Instructions (Addendum)

## 2012-01-15 NOTE — Progress Notes (Signed)
  Subjective:    Patient ID: Todd Larson, male    DOB: [birth year], 72 y.o.   MRN: [id number]  HPI Symptoms began 1/9 & 10   as head pressure over crown associated with sinus congestion. That improved with Simply Saline .  His eye doctor has recommended that he use baby shampoo to clean the eyes externally. He feels that there is some possible extrinsic improvement to the conjunctivitis as well as component of drying   There is no history of asthma.He has seasonal allergies. The patient had never smoked .There are 6 cats in the house              Review of Systems  At present no significant associated  frontal headache, facial pain, dental pain, sore throat, nasal purulence, earache, & otic discharge Fever , chills and sweats not present   No cough , sputum,  shortness of breath or wheezing .   Itchy , watery eyes & sneezing were not noted.    He is much better in Physical Therapy ; he has triceps weakness which is improving.     Objective:   Physical Exam  General appearance:good health ;well nourished; no acute distress or increased work of breathing is present.  No  lymphadenopathy about the head, neck, or axilla noted.  Eyes: No conjunctival inflammation or lid edema is present.  Ears:  External ear exam shows no significant lesions or deformities.  Otoscopic examination reveals clear canals, tympanic membranes are intact bilaterally without bulging, retraction, inflammation or discharge. Nose:  External nasal examination shows no deformity or inflammation. Nasal mucosa are pink and moist without lesions or exudates. No septal dislocation or deviation.No obstruction to airflow.  Oral exam: Dental hygiene is good; lips and gums are healthy appearing.There is no oropharyngeal erythema or exudate noted.  Neck:  No deformities,  masses, or tenderness noted.   Heart:  Normal rate and regular rhythm. S1 and S2 normal without gallop, murmur, click, rub or other extra sounds.    Lungs:Chest clear to auscultation; no wheezes, rhonchi,rales ,or rubs present.No increased work of breathing.   Extremities:  No cyanosis, edema, or clubbing  noted  Skin: Warm & dry .          Assessment & Plan:  #1extrinsic rhinoconjunctivitis Plan: See orders and recommendations

## 2012-01-18 ENCOUNTER — Ambulatory Visit: Payer: Medicare Other | Admitting: Physical Therapy

## 2012-01-22 ENCOUNTER — Ambulatory Visit: Payer: Medicare Other | Admitting: Physical Therapy

## 2012-01-25 ENCOUNTER — Ambulatory Visit: Payer: Medicare Other | Admitting: Physical Therapy

## 2012-01-29 ENCOUNTER — Ambulatory Visit: Payer: Medicare Other | Admitting: Physical Therapy

## 2012-01-31 ENCOUNTER — Encounter: Payer: Self-pay | Admitting: Internal Medicine

## 2012-02-01 ENCOUNTER — Ambulatory Visit: Payer: Medicare Other | Admitting: Physical Therapy

## 2012-02-05 ENCOUNTER — Ambulatory Visit: Payer: Medicare Other | Attending: Internal Medicine | Admitting: Physical Therapy

## 2012-02-05 DIAGNOSIS — M542 Cervicalgia: Secondary | ICD-10-CM | POA: Insufficient documentation

## 2012-02-05 DIAGNOSIS — M546 Pain in thoracic spine: Secondary | ICD-10-CM | POA: Insufficient documentation

## 2012-02-05 DIAGNOSIS — M2569 Stiffness of other specified joint, not elsewhere classified: Secondary | ICD-10-CM | POA: Insufficient documentation

## 2012-02-05 DIAGNOSIS — IMO0001 Reserved for inherently not codable concepts without codable children: Secondary | ICD-10-CM | POA: Diagnosis not present

## 2012-02-08 ENCOUNTER — Ambulatory Visit: Payer: Medicare Other | Admitting: Physical Therapy

## 2012-02-08 DIAGNOSIS — M546 Pain in thoracic spine: Secondary | ICD-10-CM | POA: Diagnosis not present

## 2012-02-08 DIAGNOSIS — M542 Cervicalgia: Secondary | ICD-10-CM | POA: Diagnosis not present

## 2012-02-08 DIAGNOSIS — IMO0001 Reserved for inherently not codable concepts without codable children: Secondary | ICD-10-CM | POA: Diagnosis not present

## 2012-02-12 ENCOUNTER — Ambulatory Visit: Payer: Medicare Other | Admitting: Physical Therapy

## 2012-02-12 DIAGNOSIS — IMO0001 Reserved for inherently not codable concepts without codable children: Secondary | ICD-10-CM | POA: Diagnosis not present

## 2012-02-12 DIAGNOSIS — M546 Pain in thoracic spine: Secondary | ICD-10-CM | POA: Diagnosis not present

## 2012-02-12 DIAGNOSIS — M542 Cervicalgia: Secondary | ICD-10-CM | POA: Diagnosis not present

## 2012-02-15 ENCOUNTER — Ambulatory Visit: Payer: Medicare Other | Admitting: Physical Therapy

## 2012-02-19 ENCOUNTER — Ambulatory Visit: Payer: Medicare Other | Admitting: Physical Therapy

## 2012-02-19 DIAGNOSIS — M542 Cervicalgia: Secondary | ICD-10-CM | POA: Diagnosis not present

## 2012-02-19 DIAGNOSIS — IMO0001 Reserved for inherently not codable concepts without codable children: Secondary | ICD-10-CM | POA: Diagnosis not present

## 2012-02-19 DIAGNOSIS — M546 Pain in thoracic spine: Secondary | ICD-10-CM | POA: Diagnosis not present

## 2012-02-20 DIAGNOSIS — R358 Other polyuria: Secondary | ICD-10-CM | POA: Diagnosis not present

## 2012-02-20 DIAGNOSIS — N139 Obstructive and reflux uropathy, unspecified: Secondary | ICD-10-CM | POA: Diagnosis not present

## 2012-02-20 DIAGNOSIS — N401 Enlarged prostate with lower urinary tract symptoms: Secondary | ICD-10-CM | POA: Diagnosis not present

## 2012-02-20 DIAGNOSIS — R972 Elevated prostate specific antigen [PSA]: Secondary | ICD-10-CM | POA: Diagnosis not present

## 2012-02-22 ENCOUNTER — Ambulatory Visit: Payer: Medicare Other | Admitting: Physical Therapy

## 2012-02-22 DIAGNOSIS — IMO0001 Reserved for inherently not codable concepts without codable children: Secondary | ICD-10-CM | POA: Diagnosis not present

## 2012-02-22 DIAGNOSIS — M546 Pain in thoracic spine: Secondary | ICD-10-CM | POA: Diagnosis not present

## 2012-02-26 ENCOUNTER — Ambulatory Visit: Payer: Medicare Other | Admitting: Physical Therapy

## 2012-02-26 DIAGNOSIS — IMO0001 Reserved for inherently not codable concepts without codable children: Secondary | ICD-10-CM | POA: Diagnosis not present

## 2012-02-29 ENCOUNTER — Ambulatory Visit: Payer: Medicare Other | Admitting: Physical Therapy

## 2012-02-29 DIAGNOSIS — IMO0001 Reserved for inherently not codable concepts without codable children: Secondary | ICD-10-CM | POA: Diagnosis not present

## 2012-02-29 DIAGNOSIS — M542 Cervicalgia: Secondary | ICD-10-CM | POA: Diagnosis not present

## 2012-02-29 DIAGNOSIS — M546 Pain in thoracic spine: Secondary | ICD-10-CM | POA: Diagnosis not present

## 2012-03-03 ENCOUNTER — Ambulatory Visit: Payer: Medicare Other | Attending: Internal Medicine | Admitting: Physical Therapy

## 2012-03-03 DIAGNOSIS — M546 Pain in thoracic spine: Secondary | ICD-10-CM | POA: Insufficient documentation

## 2012-03-03 DIAGNOSIS — IMO0001 Reserved for inherently not codable concepts without codable children: Secondary | ICD-10-CM | POA: Diagnosis not present

## 2012-03-03 DIAGNOSIS — M2569 Stiffness of other specified joint, not elsewhere classified: Secondary | ICD-10-CM | POA: Insufficient documentation

## 2012-03-03 DIAGNOSIS — M542 Cervicalgia: Secondary | ICD-10-CM | POA: Diagnosis not present

## 2012-03-07 ENCOUNTER — Ambulatory Visit: Payer: Medicare Other | Admitting: Physical Therapy

## 2012-03-11 ENCOUNTER — Ambulatory Visit: Payer: Medicare Other | Admitting: Physical Therapy

## 2012-03-11 DIAGNOSIS — IMO0001 Reserved for inherently not codable concepts without codable children: Secondary | ICD-10-CM | POA: Diagnosis not present

## 2012-03-11 DIAGNOSIS — M542 Cervicalgia: Secondary | ICD-10-CM | POA: Diagnosis not present

## 2012-03-11 DIAGNOSIS — M546 Pain in thoracic spine: Secondary | ICD-10-CM | POA: Diagnosis not present

## 2012-03-13 ENCOUNTER — Encounter: Payer: Medicare Other | Admitting: Physical Therapy

## 2012-03-13 DIAGNOSIS — N35919 Unspecified urethral stricture, male, unspecified site: Secondary | ICD-10-CM | POA: Diagnosis not present

## 2012-03-14 ENCOUNTER — Ambulatory Visit: Payer: Medicare Other | Admitting: Physical Therapy

## 2012-03-14 DIAGNOSIS — IMO0001 Reserved for inherently not codable concepts without codable children: Secondary | ICD-10-CM | POA: Diagnosis not present

## 2012-03-18 ENCOUNTER — Ambulatory Visit: Payer: Medicare Other | Admitting: Physical Therapy

## 2012-03-18 DIAGNOSIS — M546 Pain in thoracic spine: Secondary | ICD-10-CM | POA: Diagnosis not present

## 2012-03-18 DIAGNOSIS — M542 Cervicalgia: Secondary | ICD-10-CM | POA: Diagnosis not present

## 2012-03-18 DIAGNOSIS — IMO0001 Reserved for inherently not codable concepts without codable children: Secondary | ICD-10-CM | POA: Diagnosis not present

## 2012-03-21 ENCOUNTER — Ambulatory Visit: Payer: Medicare Other | Admitting: Physical Therapy

## 2012-03-21 DIAGNOSIS — M546 Pain in thoracic spine: Secondary | ICD-10-CM | POA: Diagnosis not present

## 2012-03-21 DIAGNOSIS — IMO0001 Reserved for inherently not codable concepts without codable children: Secondary | ICD-10-CM | POA: Diagnosis not present

## 2012-03-21 DIAGNOSIS — M542 Cervicalgia: Secondary | ICD-10-CM | POA: Diagnosis not present

## 2012-03-25 ENCOUNTER — Ambulatory Visit: Payer: Medicare Other | Admitting: Physical Therapy

## 2012-03-25 DIAGNOSIS — IMO0001 Reserved for inherently not codable concepts without codable children: Secondary | ICD-10-CM | POA: Diagnosis not present

## 2012-03-25 DIAGNOSIS — M542 Cervicalgia: Secondary | ICD-10-CM | POA: Diagnosis not present

## 2012-03-25 DIAGNOSIS — M546 Pain in thoracic spine: Secondary | ICD-10-CM | POA: Diagnosis not present

## 2012-03-28 ENCOUNTER — Ambulatory Visit: Payer: Medicare Other | Admitting: Physical Therapy

## 2012-03-28 DIAGNOSIS — M546 Pain in thoracic spine: Secondary | ICD-10-CM | POA: Diagnosis not present

## 2012-03-28 DIAGNOSIS — M542 Cervicalgia: Secondary | ICD-10-CM | POA: Diagnosis not present

## 2012-03-28 DIAGNOSIS — IMO0001 Reserved for inherently not codable concepts without codable children: Secondary | ICD-10-CM | POA: Diagnosis not present

## 2012-04-01 ENCOUNTER — Ambulatory Visit: Payer: Medicare Other | Attending: Internal Medicine | Admitting: Physical Therapy

## 2012-04-01 DIAGNOSIS — M546 Pain in thoracic spine: Secondary | ICD-10-CM | POA: Insufficient documentation

## 2012-04-01 DIAGNOSIS — M2569 Stiffness of other specified joint, not elsewhere classified: Secondary | ICD-10-CM | POA: Insufficient documentation

## 2012-04-01 DIAGNOSIS — IMO0001 Reserved for inherently not codable concepts without codable children: Secondary | ICD-10-CM | POA: Insufficient documentation

## 2012-04-01 DIAGNOSIS — M542 Cervicalgia: Secondary | ICD-10-CM | POA: Insufficient documentation

## 2012-04-04 ENCOUNTER — Ambulatory Visit: Payer: Medicare Other | Admitting: Physical Therapy

## 2012-04-04 DIAGNOSIS — M546 Pain in thoracic spine: Secondary | ICD-10-CM | POA: Diagnosis not present

## 2012-04-04 DIAGNOSIS — IMO0001 Reserved for inherently not codable concepts without codable children: Secondary | ICD-10-CM | POA: Diagnosis not present

## 2012-04-04 DIAGNOSIS — M542 Cervicalgia: Secondary | ICD-10-CM | POA: Diagnosis not present

## 2012-04-07 ENCOUNTER — Telehealth: Payer: Self-pay | Admitting: *Deleted

## 2012-04-07 MED ORDER — AMBULATORY NON FORMULARY MEDICATION: Status: DC

## 2012-04-07 NOTE — Telephone Encounter (Signed)
Pt states that he call and Friday and had yet to hear anything so he is calling again today to follow up. Pt states that the ortho recommend a neck traction device. Pt states that info was sent over to our office and  he would like to know how long this will take for Dr Todd Larson to complete this info Pt indicated that Dr Todd Larson has to sign off on device in order to have insurance cover it.Please advise

## 2012-04-07 NOTE — Telephone Encounter (Signed)
Patient needs Dr.Hopper to give an order and send [facility] to [facility]. Order printed and placed on ledge for signature then to be faxed.

## 2012-04-07 NOTE — Telephone Encounter (Signed)
OK if recommended by PT ; I don't believe I've seen PT records

## 2012-04-08 ENCOUNTER — Ambulatory Visit: Payer: Medicare Other | Admitting: Physical Therapy

## 2012-04-08 DIAGNOSIS — IMO0001 Reserved for inherently not codable concepts without codable children: Secondary | ICD-10-CM | POA: Diagnosis not present

## 2012-04-08 DIAGNOSIS — M542 Cervicalgia: Secondary | ICD-10-CM | POA: Diagnosis not present

## 2012-04-08 DIAGNOSIS — M546 Pain in thoracic spine: Secondary | ICD-10-CM | POA: Diagnosis not present

## 2012-04-11 ENCOUNTER — Ambulatory Visit: Payer: Medicare Other | Admitting: Physical Therapy

## 2012-04-11 DIAGNOSIS — M546 Pain in thoracic spine: Secondary | ICD-10-CM | POA: Diagnosis not present

## 2012-04-11 DIAGNOSIS — M542 Cervicalgia: Secondary | ICD-10-CM | POA: Diagnosis not present

## 2012-04-11 DIAGNOSIS — IMO0001 Reserved for inherently not codable concepts without codable children: Secondary | ICD-10-CM | POA: Diagnosis not present

## 2012-04-22 ENCOUNTER — Other Ambulatory Visit: Payer: Self-pay | Admitting: Internal Medicine

## 2012-05-06 ENCOUNTER — Encounter: Payer: Medicare Other | Admitting: Physical Therapy

## 2012-05-29 ENCOUNTER — Encounter: Payer: Self-pay | Admitting: Internal Medicine

## 2012-05-29 ENCOUNTER — Ambulatory Visit (INDEPENDENT_AMBULATORY_CARE_PROVIDER_SITE_OTHER): Payer: Medicare Other | Admitting: Internal Medicine

## 2012-05-29 VITALS — BP 122/76 | HR 110 | Temp 98.8°F | Wt 227.0 lb

## 2012-05-29 DIAGNOSIS — M5030 Other cervical disc degeneration, unspecified cervical region: Secondary | ICD-10-CM | POA: Diagnosis not present

## 2012-05-29 MED ORDER — MELOXICAM 7.5 MG PO TABS: ORAL_TABLET | ORAL | Status: DC

## 2012-05-29 NOTE — Patient Instructions (Addendum)
Use an anti-inflammatory cream such as Aspercreme or Zostrix cream twice a day to the neck as needed. In lieu of this warm moist compresses or  hot water bottle can be used. Do not apply ice.

## 2012-05-29 NOTE — Progress Notes (Signed)
  Subjective:    Patient ID: Todd Larson, male    DOB: 05/29/1940, 72 y.o.   MRN: [id number]  HPI His neck symptoms recurred approximately one month ago ,described as stiffness and soreness with decreased range of motion. Discomfort is up to level III-4/10 and describes aching.  This is in the setting of a prior history of degenerative disc disease in cervical spine, greatest at C5-6.  Physical therapy with neck traction had been of benefit. A 12 pound weight was used with symptomatic improvement  When the pain recurred he had increasing discomfort with weights of 9 pounds or more. He is now using a 6 pound weight  He continues to have limited mobility of his neck and cracking  sounds in the neck as well.  Physical therapy continues to work with him unofficially, advising him as to the traction techniques.    Review of Systems  He denies associated headache, fever, nausea, limb paresthesia.  He has had triceps weakness in the past. He is not having limb weakness at this time.     Objective:   Physical Exam  He appears healthy and well-nourished and in no acute distress  Range of motion cervical spine is significantly decreased, especially laterally.  He has no lymphadenopathy about the neck or axilla  There is no significant malalignment of the thoracic spine  Strength and tone testing is normal the upper extremities  Deep tendon reflexes are normal and equal        Assessment & Plan:  #1 recurrence of musculoskeletal pain related to cervical degenerative disc disease. No neuromuscular deficit present  Plan: See orders and recommendations

## 2012-06-04 ENCOUNTER — Ambulatory Visit: Payer: Medicare Other | Admitting: Internal Medicine

## 2012-06-09 ENCOUNTER — Ambulatory Visit (INDEPENDENT_AMBULATORY_CARE_PROVIDER_SITE_OTHER): Payer: Medicare Other | Admitting: Internal Medicine

## 2012-06-09 ENCOUNTER — Encounter: Payer: Self-pay | Admitting: Internal Medicine

## 2012-06-09 VITALS — BP 126/72 | HR 115 | Temp 98.4°F | Wt 226.0 lb

## 2012-06-09 DIAGNOSIS — M542 Cervicalgia: Secondary | ICD-10-CM

## 2012-06-09 DIAGNOSIS — G8929 Other chronic pain: Secondary | ICD-10-CM | POA: Insufficient documentation

## 2012-06-09 NOTE — Progress Notes (Signed)
  Subjective:    Patient ID: Todd Larson, male    DOB: 1940/12/24, 72 y.o.   MRN: [id number]  HPI NECK PAIN Location: lower posterior neck muscles   Onset: 1 month   Severity: up to 4, now 2 Pain is described ON:[id number], achiness  Worse with: watching tv while on treadmill    Better with: Flexeril   Pain radiates to: posterior scalp line   Impaired range of motion: chronic related to ergonomics @ work  History of repetitive motion: no History of overuse or hyperextension: only as above History of trauma: no          Review of Systems Numbness/tingling:  no Weakness: only related to pinched nerve to L triceps Fever:  no Headache:  no  Bowel/bladder dysfunction:  no      Objective:   Physical Exam Gen.:  well-nourished in appearance. Alert, appropriate and cooperative throughout exam. Head: Normocephalic without obvious abnormalities  Eyes: No corneal or conjunctival inflammation noted. Ptosis bilaterally Neck: No deformities, masses, or tenderness noted. Range of motion decreased laterally , especially to L  Musculoskeletal/extremities: No deformity or scoliosis noted of  the thoracic or lumbar spine.  No clubbing, cyanosis, edema, or significant extremity  deformity noted. Tone & strength  Normal. Joints normal . Nail health good. Neurologic: Alert and oriented x3. Deep tendon reflexes symmetrical and normal.  No cranial nerve deficit .        Skin: Intact without suspicious lesions or rashes. Lymph: No cervical, axillary lymphadenopathy present. Psych: Mood and affect are normal. Normally interactive                                                                                        Assessment & Plan:  See Current Assessment & Plan in Problem List under specific Diagnosis

## 2012-06-09 NOTE — Patient Instructions (Addendum)
Share report with Dr Christell Faith & his staff seen

## 2012-06-12 DIAGNOSIS — M999 Biomechanical lesion, unspecified: Secondary | ICD-10-CM | POA: Diagnosis not present

## 2012-06-12 DIAGNOSIS — S239XXA Sprain of unspecified parts of thorax, initial encounter: Secondary | ICD-10-CM | POA: Diagnosis not present

## 2012-06-12 DIAGNOSIS — S139XXA Sprain of joints and ligaments of unspecified parts of neck, initial encounter: Secondary | ICD-10-CM | POA: Diagnosis not present

## 2012-06-12 DIAGNOSIS — N401 Enlarged prostate with lower urinary tract symptoms: Secondary | ICD-10-CM | POA: Diagnosis not present

## 2012-06-12 DIAGNOSIS — N139 Obstructive and reflux uropathy, unspecified: Secondary | ICD-10-CM | POA: Diagnosis not present

## 2012-06-12 DIAGNOSIS — M9981 Other biomechanical lesions of cervical region: Secondary | ICD-10-CM | POA: Diagnosis not present

## 2012-06-16 DIAGNOSIS — M999 Biomechanical lesion, unspecified: Secondary | ICD-10-CM | POA: Diagnosis not present

## 2012-06-16 DIAGNOSIS — M9981 Other biomechanical lesions of cervical region: Secondary | ICD-10-CM | POA: Diagnosis not present

## 2012-06-16 DIAGNOSIS — S139XXA Sprain of joints and ligaments of unspecified parts of neck, initial encounter: Secondary | ICD-10-CM | POA: Diagnosis not present

## 2012-06-16 DIAGNOSIS — S239XXA Sprain of unspecified parts of thorax, initial encounter: Secondary | ICD-10-CM | POA: Diagnosis not present

## 2012-06-18 DIAGNOSIS — S239XXA Sprain of unspecified parts of thorax, initial encounter: Secondary | ICD-10-CM | POA: Diagnosis not present

## 2012-06-18 DIAGNOSIS — M9981 Other biomechanical lesions of cervical region: Secondary | ICD-10-CM | POA: Diagnosis not present

## 2012-06-18 DIAGNOSIS — S139XXA Sprain of joints and ligaments of unspecified parts of neck, initial encounter: Secondary | ICD-10-CM | POA: Diagnosis not present

## 2012-06-18 DIAGNOSIS — M999 Biomechanical lesion, unspecified: Secondary | ICD-10-CM | POA: Diagnosis not present

## 2012-06-19 DIAGNOSIS — S139XXA Sprain of joints and ligaments of unspecified parts of neck, initial encounter: Secondary | ICD-10-CM | POA: Diagnosis not present

## 2012-06-19 DIAGNOSIS — S239XXA Sprain of unspecified parts of thorax, initial encounter: Secondary | ICD-10-CM | POA: Diagnosis not present

## 2012-06-19 DIAGNOSIS — M9981 Other biomechanical lesions of cervical region: Secondary | ICD-10-CM | POA: Diagnosis not present

## 2012-06-19 DIAGNOSIS — M999 Biomechanical lesion, unspecified: Secondary | ICD-10-CM | POA: Diagnosis not present

## 2012-06-24 DIAGNOSIS — M9981 Other biomechanical lesions of cervical region: Secondary | ICD-10-CM | POA: Diagnosis not present

## 2012-06-24 DIAGNOSIS — S239XXA Sprain of unspecified parts of thorax, initial encounter: Secondary | ICD-10-CM | POA: Diagnosis not present

## 2012-06-24 DIAGNOSIS — S139XXA Sprain of joints and ligaments of unspecified parts of neck, initial encounter: Secondary | ICD-10-CM | POA: Diagnosis not present

## 2012-06-24 DIAGNOSIS — M999 Biomechanical lesion, unspecified: Secondary | ICD-10-CM | POA: Diagnosis not present

## 2012-06-26 DIAGNOSIS — M9981 Other biomechanical lesions of cervical region: Secondary | ICD-10-CM | POA: Diagnosis not present

## 2012-06-26 DIAGNOSIS — S239XXA Sprain of unspecified parts of thorax, initial encounter: Secondary | ICD-10-CM | POA: Diagnosis not present

## 2012-06-26 DIAGNOSIS — S139XXA Sprain of joints and ligaments of unspecified parts of neck, initial encounter: Secondary | ICD-10-CM | POA: Diagnosis not present

## 2012-06-26 DIAGNOSIS — M999 Biomechanical lesion, unspecified: Secondary | ICD-10-CM | POA: Diagnosis not present

## 2012-07-08 DIAGNOSIS — M999 Biomechanical lesion, unspecified: Secondary | ICD-10-CM | POA: Diagnosis not present

## 2012-07-08 DIAGNOSIS — M9981 Other biomechanical lesions of cervical region: Secondary | ICD-10-CM | POA: Diagnosis not present

## 2012-07-08 DIAGNOSIS — S239XXA Sprain of unspecified parts of thorax, initial encounter: Secondary | ICD-10-CM | POA: Diagnosis not present

## 2012-07-08 DIAGNOSIS — S139XXA Sprain of joints and ligaments of unspecified parts of neck, initial encounter: Secondary | ICD-10-CM | POA: Diagnosis not present

## 2012-07-29 DIAGNOSIS — S139XXA Sprain of joints and ligaments of unspecified parts of neck, initial encounter: Secondary | ICD-10-CM | POA: Diagnosis not present

## 2012-07-29 DIAGNOSIS — S239XXA Sprain of unspecified parts of thorax, initial encounter: Secondary | ICD-10-CM | POA: Diagnosis not present

## 2012-07-29 DIAGNOSIS — M999 Biomechanical lesion, unspecified: Secondary | ICD-10-CM | POA: Diagnosis not present

## 2012-07-29 DIAGNOSIS — M9981 Other biomechanical lesions of cervical region: Secondary | ICD-10-CM | POA: Diagnosis not present

## 2012-08-07 ENCOUNTER — Encounter: Payer: Self-pay | Admitting: Internal Medicine

## 2012-08-07 ENCOUNTER — Ambulatory Visit (INDEPENDENT_AMBULATORY_CARE_PROVIDER_SITE_OTHER): Payer: Medicare Other | Admitting: Internal Medicine

## 2012-08-07 ENCOUNTER — Telehealth: Payer: Self-pay | Admitting: Internal Medicine

## 2012-08-07 VITALS — BP 128/80 | HR 106 | Temp 98.9°F | Wt 228.8 lb

## 2012-08-07 DIAGNOSIS — R0789 Other chest pain: Secondary | ICD-10-CM

## 2012-08-07 DIAGNOSIS — R079 Chest pain, unspecified: Secondary | ICD-10-CM | POA: Diagnosis not present

## 2012-08-07 DIAGNOSIS — R071 Chest pain on breathing: Secondary | ICD-10-CM | POA: Diagnosis not present

## 2012-08-07 NOTE — Telephone Encounter (Signed)
Caller Name: Damoney  Phone: [phone]  Patient: Todd Larson, Todd Larson  Gender: Male  DOB: Aug 08, 1940  Age: 72 Years  PCP: Marga Melnick  Does the office need to follow up with this patient?: Yes  RN Note:  ongoing DJD in neck, pinched nerve, treated in physical therapy, chiropractor. NO appt available in EPIC, please call with recommendations   Reason For Call & Symptoms: emergent call  reports onset 08/07/12, @ 0930, pressure left chest, at the gym, not worse on the treadmill. HR 108, intermittent, thinks from increased GERD  Reviewed Health History In EMR: Yes  Reviewed Medications In EMR: Yes  Reviewed Allergies In EMR: Yes  Reviewed Surgeries / Procedures: Yes  Date of Onset of Symptoms: 08/07/2012  Guideline(s) Used:  Chest Pain  Disposition Per Guideline:  See Today in Office  Reason For Disposition Reached:  All other patients with chest pain  Advice Given:  Call Back If:  Severe chest pain  Difficulty breathing  You become worse.  Patient Will Follow Care Advice:  YES

## 2012-08-07 NOTE — Telephone Encounter (Signed)
Hopp please advise  

## 2012-08-07 NOTE — Progress Notes (Signed)
Subjective:    Patient ID: Todd Larson, male    DOB: 23-Feb-1940, 72 y.o.   MRN: [id number]  HPI  Pt presents to the clinic today with c/o intermittent chest pain.This started today while he was at home. The pain does come and go, but it does not radiate. It does seem to get worse with movement. He was able to go to the gym and workout as usual. He did not have a increase in chest pain, chest tightness or shortness of breath. He denies dizziness or near syncope. It is not worse with exertion.  He thinks it may be related to gas/reflux. He does have a history of reflux. He does not take anything OTC for reflux.  Review of Systems      Past Medical History  Diagnosis Date  . GERD (gastroesophageal reflux disease)   . PSA elevation     History of   . Diverticulosis of colon   . Bulging discs      thoracic spine    Current Outpatient Prescriptions  Medication Sig Dispense Refill  . AMBULATORY NON FORMULARY MEDICATION Medication Name: Neck Traction Device, DX: Cervical Discomfort/irritation  1 each  0  . cyclobenzaprine (FLEXERIL) 5 MG tablet Take 1 tablet (5 mg total) by mouth as directed. 1 by mouth twice daily and 1-2 at bedtime if needed  30 tablet  20  . dutasteride (AVODART) 0.5 MG capsule Take 0.5 mg by mouth daily.      . fluticasone (FLONASE) 50 MCG/ACT nasal spray Place 1 spray into the nose 2 (two) times daily as needed for rhinitis.  16 g  11  . hyoscyamine (ANASPAZ) 0.125 MG TBDP Place 0.125 mg under the tongue every 4 (four) hours as needed.      Marland Kitchen losartan (COZAAR) 50 MG tablet TAKE 1 TABLET BY MOUTH DAILY.  90 tablet  2  . meloxicam (MOBIC) 7.5 MG tablet 1 bid prn  30 tablet  0  . traMADol (ULTRAM) 50 MG tablet TAKE 1 TABLET BY MOUTH EVERY 6 HOURS AS NEEDED  30 tablet  5   No current facility-administered medications for this visit.    Allergies  Allergen Reactions  . Amoxicillin-Pot Clavulanate     REACTION: diarrhea/night sweats    Family History  Problem  Relation Age of Onset  . Lung cancer Maternal Grandfather   . Heart attack Paternal Uncle   . Prostate cancer Brother     History   Social History  . Marital Status: Single    Spouse Name: N/A    Number of Children: 0  . Years of Education: N/A   Occupational History  . retired    Social History Main Topics  . Smoking status: Never Smoker   . Smokeless tobacco: Never Used  . Alcohol Use: Yes     Comment: social  . Drug Use: No  . Sexually Active: Not on file   Other Topics Concern  . Not on file   Social History Narrative  . No narrative on file     Constitutional: Denies fever, malaise, fatigue, headache or abrupt weight changes.  Respiratory: Denies difficulty breathing, shortness of breath, cough or sputum production.   Cardiovascular: Pt reports chest pain on the left side. Denies chest tightness, palpitations or swelling in the hands or feet.  Musculoskeletal: Denies decrease in range of motion, difficulty with gait, muscle pain or joint pain and swelling.   Neurological: Denies dizziness, difficulty with memory, difficulty with speech or problems  with balance and coordination.   No other specific complaints in a complete review of systems (except as listed in HPI above).  Objective:   Physical Exam  BP 128/80  Pulse 106  Temp(Src) 98.9 F (37.2 C) (Oral)  Wt 228 lb 12.8 oz (103.783 kg)  BMI 33.77 kg/m2  SpO2 96% Wt Readings from Last 3 Encounters:  08/07/12 228 lb 12.8 oz (103.783 kg)  06/09/12 226 lb (102.513 kg)  05/29/12 227 lb (102.967 kg)    General: Appears his stated age, well developed, well nourished in NAD.  Cardiovascular: Normal rate and rhythm. S1,S2 noted.  No murmur, rubs or gallops noted. No JVD or BLE edema. No carotid bruits noted. Pulmonary/Chest: Normal effort and positive vesicular breath sounds. No respiratory distress. No wheezes, rales or ronchi noted.  Musculoskeletal: Normal range of motion. No signs of joint swelling. No  difficulty with gait. Tender to palpation of the left pectoral muscles. Neurological: Alert and oriented. Cranial nerves II-XII intact. Coordination normal. +DTRs bilaterally.   EKG: sinus tach with low voltage in the limb leads  BMET    Component Value Date/Time   NA 141 05/17/2010 0835   K 4.5 05/17/2010 0835   CL 105 05/17/2010 0835   CO2 27 05/17/2010 0835   GLUCOSE 93 05/17/2010 0835   BUN 12 05/17/2010 0835   CREATININE 1.0 05/17/2010 0835   CALCIUM 9.3 05/17/2010 0835   GFRNONAA 71.11 11/12/2008 0954    Lipid Panel     Component Value Date/Time   CHOL 178 05/17/2010 0835   TRIG 137.0 05/17/2010 0835   HDL 36.70* 05/17/2010 0835   CHOLHDL 5 05/17/2010 0835   VLDL 27.4 05/17/2010 0835   LDLCALC 114* 05/17/2010 0835    CBC    Component Value Date/Time   WBC 7.6 05/17/2010 0835   RBC 5.73 05/17/2010 0835   HGB 18.0 cH* 05/17/2010 0835   HCT 51.9 05/17/2010 0835   PLT 249.0 05/17/2010 0835   MCV 90.5 05/17/2010 0835   MCHC 34.8 05/17/2010 0835   RDW 12.9 05/17/2010 0835   LYMPHSABS 2.4 05/17/2010 0835   MONOABS 0.5 05/17/2010 0835   EOSABS 0.3 05/17/2010 0835   BASOSABS 0.1 05/17/2010 0835    Hgb A1C Lab Results  Component Value Date   HGBA1C 5.0 05/17/2010         Assessment & Plan:   Left sided chest pain, most likely musculoskeletal, new onset:  EKG t or/o ACS Stretch as indicated on handout Ibuprofen 600 mg TID for the next 24 hours Heating pad may help  RTC as needed or if pain persist or worsen

## 2012-08-07 NOTE — Patient Instructions (Signed)

## 2012-09-12 DIAGNOSIS — N401 Enlarged prostate with lower urinary tract symptoms: Secondary | ICD-10-CM | POA: Diagnosis not present

## 2012-09-12 DIAGNOSIS — N139 Obstructive and reflux uropathy, unspecified: Secondary | ICD-10-CM | POA: Diagnosis not present

## 2012-10-24 ENCOUNTER — Encounter (INDEPENDENT_AMBULATORY_CARE_PROVIDER_SITE_OTHER): Payer: Medicare Other | Admitting: Ophthalmology

## 2012-10-24 DIAGNOSIS — H35449 Age-related reticular degeneration of retina, unspecified eye: Secondary | ICD-10-CM | POA: Diagnosis not present

## 2012-10-24 DIAGNOSIS — H43819 Vitreous degeneration, unspecified eye: Secondary | ICD-10-CM | POA: Diagnosis not present

## 2012-10-24 DIAGNOSIS — H35039 Hypertensive retinopathy, unspecified eye: Secondary | ICD-10-CM

## 2012-10-24 DIAGNOSIS — H2510 Age-related nuclear cataract, unspecified eye: Secondary | ICD-10-CM

## 2012-10-24 DIAGNOSIS — I10 Essential (primary) hypertension: Secondary | ICD-10-CM | POA: Diagnosis not present

## 2012-10-30 ENCOUNTER — Ambulatory Visit (INDEPENDENT_AMBULATORY_CARE_PROVIDER_SITE_OTHER): Payer: Medicare Other | Admitting: Internal Medicine

## 2012-10-30 ENCOUNTER — Encounter: Payer: Self-pay | Admitting: Internal Medicine

## 2012-10-30 ENCOUNTER — Ambulatory Visit: Payer: Medicare Other

## 2012-10-30 VITALS — BP 147/88 | HR 108 | Temp 99.6°F | Wt 230.8 lb

## 2012-10-30 DIAGNOSIS — L219 Seborrheic dermatitis, unspecified: Secondary | ICD-10-CM

## 2012-10-30 DIAGNOSIS — L259 Unspecified contact dermatitis, unspecified cause: Secondary | ICD-10-CM

## 2012-10-30 DIAGNOSIS — R109 Unspecified abdominal pain: Secondary | ICD-10-CM | POA: Diagnosis not present

## 2012-10-30 DIAGNOSIS — L309 Dermatitis, unspecified: Secondary | ICD-10-CM

## 2012-10-30 DIAGNOSIS — Z23 Encounter for immunization: Secondary | ICD-10-CM | POA: Diagnosis not present

## 2012-10-30 DIAGNOSIS — R1032 Left lower quadrant pain: Secondary | ICD-10-CM

## 2012-10-30 NOTE — Progress Notes (Signed)
  Subjective:    Patient ID: Todd Larson, male    DOB: [birth year], 72 y.o.   MRN: [id number]  HPI   Symptoms began 10/29/12 as discomfort in the left groin area. It is described as a level III with a tearing type sensation which lasts minutes and is intermittent. It is nonradiating. This is associated with a drawing sensation of the left testicle and abdominal pressure.  He questions whether this could have been related to an injury almost 5 decades ago when he was struck in the left testicle by hockey puck. He was told that time that there was a tearing injury. He also questions whether kneeling does a role in exacerbating symptoms.  Associated is a non-pruritic localized rash in the left posterior axilla. He questions whether this might be related to his deodorant.  He also has a skin lesion over the right anterior chest; he questioned its nature      Review of Systems  He has no associated fever, chills, sweats, weight loss. He also denies dysuria, pyuria, or hematuria. There's been no rash or change in color/temp to the skin in this area     Objective:   Physical Exam  General appearance is one of good health and nourishment w/o distress.  Eyes: No conjunctival inflammation or scleral icterus is present.  Oral exam: Dental hygiene is good; lips and gums are healthy appearing.There is no oropharyngeal erythema or exudate noted.   Heart:  Normal rate and regular rhythm. S1 and S2 normal without gallop, murmur, click, rub or other extra sounds  . S4   Lungs:Chest clear to auscultation; no wheezes, rhonchi,rales ,or rubs present.No increased work of breathing.   Abdomen: bowel sounds normal, soft and non-tender without masses or organomegaly . Ventral & umbilical hernias noted.  No guarding or rebound . No tenderness over the flanks to percussion  Musculoskeletal: Able to lie flat and sit up without help. Negative straight leg raising bilaterally. Gait normal  Skin:Warm & dry.   Intact without suspicious lesions or rashes ; no jaundice or tenting. There is bland 12 x 23 mm hyperpigmentation at the left posterior axillary line. A pigmented seborrheic keratosis is present on the right anterior chest  Lymphatic: No lymphadenopathy is noted about the head, neck, axilla, or inguinal areas.  . Some tenderness in the left testicular area GU: No definite hernia present               Assessment & Plan:  #1 inguinal pain; the most likely etiology would be testicular torsion  #2 seborrheic keratosis right anterior chest, not inflamed  #3 bland dermatitis left posterior axillary area See orders

## 2012-10-30 NOTE — Patient Instructions (Signed)
Fill tub with hot water and soak in it twice a day to help relieve the scrotal pain.  Athletic supporter can help prevent testicular torsion.  Aveeno Daily MoisturingLotion twice a day to the seborrheic keratosis  Cort Aid twice a day to the bland dermatitis in the axilla

## 2012-11-13 ENCOUNTER — Ambulatory Visit (INDEPENDENT_AMBULATORY_CARE_PROVIDER_SITE_OTHER): Payer: Medicare Other | Admitting: Internal Medicine

## 2012-11-13 ENCOUNTER — Encounter: Payer: Self-pay | Admitting: Internal Medicine

## 2012-11-13 ENCOUNTER — Telehealth: Payer: Self-pay | Admitting: *Deleted

## 2012-11-13 VITALS — BP 144/84 | HR 96 | Temp 97.2°F | Wt 230.8 lb

## 2012-11-13 DIAGNOSIS — R10814 Left lower quadrant abdominal tenderness: Secondary | ICD-10-CM

## 2012-11-13 NOTE — Progress Notes (Signed)
  Subjective:    Patient ID: Todd Larson, male    DOB: [birth year], 72 y.o.   MRN: [id number]  HPI  Symptoms began 11/10/12 mid day as has tenderness and a questionable bulge in the left lower quadrant. This progressed through the day but did improve that evening.  The 2 days prior to the onset of symptoms he had had decreased volume of bowel and felt that there was some abdominal distention.  The week prior to the onset of the symptoms he had had increased ingestion of meat  This was associated with increased gas and a drawing of the left testicle. His bowel movements have decreased in volume and are soft.  The supine position and gentle pressure in this area have been of benefit. He's also been taking Citrucel for past 3 days with increased stool. Stool has been lighter in color. There has been no associated Coke-colored urine  He describes a sensation that the bowel contents moved to the right with the direct pressure.   His last colonoscopy was 2008; this revealed internal hemorrhoids and diverticulosis.    Review of Systems  He denies associated fever, chills, sweats, weight loss. There's been no constipation per se or diarrhea. Melena and rectal bleeding also absent.  Also absent or dysuria, pyuria, or hematuria.     Objective:   Physical Exam  General appearance is one of good health and nourishment w/o distress.  Eyes: No conjunctival inflammation or scleral icterus is present.   Abdomen: bowel sounds normal, soft and non-tender without masses, organomegaly or hernias noted.  No guarding or rebound   Skin:Warm & dry.  Intact without suspicious lesions or rashes ; no jaundice or tenting  Lymphatic: No lymphadenopathy is noted about the head, neck, axilla, or inguinal areas.   Genitourinary: No scrotal abnormality. Minor tenderness on the left palpation of the testicle. No hernias present. Prostate normal in size. Stool light tan; Hemoccult negative             Assessment & Plan:  #1 left inguinal pain; clinically no evidence of hernia. His history suggest that this  discomfort was related to increased pressure in the sigmoid colon from obstipation Plan: A trial of probiotics. If symptoms recur; referral to Dr. Christella Hartigan for possible flexible sigmoidoscopy

## 2012-11-13 NOTE — Patient Instructions (Signed)
Increase roughage (fruits and vegetables) and your diet as recommended. Drink to thirst up to 40 ounces of water a day. Metamucil or other such agents may help prevent obstipation/ constipation. Take MiraLax every third night as needed for frank constipation.Please take the probiotic , Align, every day if the bowels are loose or poorly formed. This will replace the normal bacteria which  are necessary for formation of normal stool and processing of food.

## 2012-11-13 NOTE — Progress Notes (Signed)
Pre visit review using our clinic review tool, if applicable. No additional management support is needed unless otherwise documented below in the visit note. 

## 2012-11-13 NOTE — Telephone Encounter (Signed)
11/13/2012  Received fax from [facility] 11/05/2012 needing physician signature on Written Confirmation of Verbal Order for Supplies, Equipment, or Services. 11/07/2012   Attached billing Form and sent to [facility]. 11/13/2012  Returned to my desk.  Faxed and sent to batch.  No charge per [employer].  bw

## 2012-12-14 ENCOUNTER — Other Ambulatory Visit: Payer: Self-pay | Admitting: Family Medicine

## 2012-12-15 ENCOUNTER — Ambulatory Visit (INDEPENDENT_AMBULATORY_CARE_PROVIDER_SITE_OTHER): Payer: Medicare Other | Admitting: Internal Medicine

## 2012-12-15 ENCOUNTER — Encounter: Payer: Self-pay | Admitting: Internal Medicine

## 2012-12-15 VITALS — BP 121/74 | HR 98 | Temp 98.7°F | Wt 229.0 lb

## 2012-12-15 DIAGNOSIS — G8929 Other chronic pain: Secondary | ICD-10-CM | POA: Diagnosis not present

## 2012-12-15 DIAGNOSIS — M545 Low back pain, unspecified: Secondary | ICD-10-CM

## 2012-12-15 DIAGNOSIS — M542 Cervicalgia: Secondary | ICD-10-CM

## 2012-12-15 MED ORDER — AMITRIPTYLINE HCL 25 MG PO TABS: ORAL_TABLET | ORAL | Status: DC

## 2012-12-15 NOTE — Patient Instructions (Addendum)
Share report with Dr Christell Faith ; please ask his opinion of possibility spinal stenosis. Films will be  deferred to him. The best exercises for the low back include freestyle swimming, stretch aerobics, and yoga.

## 2012-12-15 NOTE — Progress Notes (Signed)
Pre visit review using our clinic review tool, if applicable. No additional management support is needed unless otherwise documented below in the visit note. 

## 2012-12-15 NOTE — Telephone Encounter (Signed)
Rx refused by Dr. [name].  Pt was seen in the office today by Dr. [name] and he wanted to change therapy.//AB/CMA

## 2012-12-15 NOTE — Progress Notes (Signed)
   Subjective:    Patient ID: Todd Larson, male    DOB: November 28, 1940, 72 y.o.   MRN: [id number]  HPI  He's had exacerbation of his chronic low back issues. It seems to be worse after sitting. Supine position, Flexeril, and a heating pad have been of some benefit. He is described as cramping and lasting 20-30 seconds and aching  lasting at least 30 minutes in the lower back. On occasion he has radiation into the upper, lateral calf. This is typically more so on the right than the left. He has had some intermittent tingling in the legs as well.  There is associated muscle pain for which she has used Flexeril with some response.  He relates this to a fall in 1989 with back injury. There's been no history of epidural steroid injections or surgery in the back. Chiropractory has been of benefit  He also has a past medical history degenerative disc disease at C 5-6.    Review of Systems  He has no associated fever, chills, sweats, or unexplained weight loss.  He also denies abdominal pain, melena, or rectal bleeding.  He has no associated loss control of his bladder or bowels  He has no dysuria, pyuria, or hematuria.     Objective:   Physical Exam Gen.:  well-nourished in appearance. Alert, appropriate and cooperative throughout exam.Appears younger than stated age  Head: Normocephalic without obvious abnormalities Neck: No deformities, masses, or tenderness noted. Range of motion good. Lungs: Normal respiratory effort; chest expands symmetrically. Lungs are clear to auscultation without rales, wheezes, or increased work of breathing. Heart: Normal rate and rhythm. Normal S1 and S2. No gallop, click, or rub. No murmur. Abdomen: Bowel sounds normal; abdomen soft and nontender. No masses or organomegaly.Ventral & umbilical hernias noted. No AAA                             Musculoskeletal/extremities: No deformity or scoliosis noted of  the thoracic or lumbar spine.  No pain to percussion of  the lumbosacral spine. No clubbing, cyanosis, edema, or significant extremity  deformity noted. Tone & strength normal. Hand joints normal . Fingernail health good. Able to lie down & sit up w/o help. Negative SLR bilaterally Vascular:  dorsalis pedis and  posterior tibial pulses are full and equal. No bruits present. Neurologic: Alert and oriented x3. Deep tendon reflexes symmetrical and normal.  Gait normal  including heel & toe walking .       Skin: Intact without suspicious lesions or rashes. Lymph: No cervical, axillary lymphadenopathy present. Psych: Mood and affect are normal. Normally interactive                                                                                        Assessment & Plan:  #1 chronic recurrent low back syndrome with some radicular character. By history he has malalignment on films made in 2011. History is of concern for spinal stenosis.  Recommendations: Chiropractic reassessment indicated.

## 2012-12-16 DIAGNOSIS — S335XXA Sprain of ligaments of lumbar spine, initial encounter: Secondary | ICD-10-CM | POA: Diagnosis not present

## 2012-12-16 DIAGNOSIS — S239XXA Sprain of unspecified parts of thorax, initial encounter: Secondary | ICD-10-CM | POA: Diagnosis not present

## 2012-12-16 DIAGNOSIS — M999 Biomechanical lesion, unspecified: Secondary | ICD-10-CM | POA: Diagnosis not present

## 2012-12-16 DIAGNOSIS — M9981 Other biomechanical lesions of cervical region: Secondary | ICD-10-CM | POA: Diagnosis not present

## 2012-12-16 DIAGNOSIS — S139XXA Sprain of joints and ligaments of unspecified parts of neck, initial encounter: Secondary | ICD-10-CM | POA: Diagnosis not present

## 2012-12-18 DIAGNOSIS — S139XXA Sprain of joints and ligaments of unspecified parts of neck, initial encounter: Secondary | ICD-10-CM | POA: Diagnosis not present

## 2012-12-18 DIAGNOSIS — S239XXA Sprain of unspecified parts of thorax, initial encounter: Secondary | ICD-10-CM | POA: Diagnosis not present

## 2012-12-18 DIAGNOSIS — M999 Biomechanical lesion, unspecified: Secondary | ICD-10-CM | POA: Diagnosis not present

## 2012-12-18 DIAGNOSIS — S335XXA Sprain of ligaments of lumbar spine, initial encounter: Secondary | ICD-10-CM | POA: Diagnosis not present

## 2012-12-18 DIAGNOSIS — M9981 Other biomechanical lesions of cervical region: Secondary | ICD-10-CM | POA: Diagnosis not present

## 2012-12-22 DIAGNOSIS — M999 Biomechanical lesion, unspecified: Secondary | ICD-10-CM | POA: Diagnosis not present

## 2012-12-22 DIAGNOSIS — S239XXA Sprain of unspecified parts of thorax, initial encounter: Secondary | ICD-10-CM | POA: Diagnosis not present

## 2012-12-22 DIAGNOSIS — S139XXA Sprain of joints and ligaments of unspecified parts of neck, initial encounter: Secondary | ICD-10-CM | POA: Diagnosis not present

## 2012-12-22 DIAGNOSIS — S335XXA Sprain of ligaments of lumbar spine, initial encounter: Secondary | ICD-10-CM | POA: Diagnosis not present

## 2012-12-22 DIAGNOSIS — M9981 Other biomechanical lesions of cervical region: Secondary | ICD-10-CM | POA: Diagnosis not present

## 2013-01-26 ENCOUNTER — Other Ambulatory Visit: Payer: Self-pay | Admitting: Internal Medicine

## 2013-01-27 NOTE — Telephone Encounter (Signed)
Losartan refilled per protocol. JG//CMA 

## 2013-03-23 ENCOUNTER — Ambulatory Visit (INDEPENDENT_AMBULATORY_CARE_PROVIDER_SITE_OTHER): Payer: Medicare Other | Admitting: Internal Medicine

## 2013-03-23 ENCOUNTER — Encounter: Payer: Self-pay | Admitting: Internal Medicine

## 2013-03-23 VITALS — BP 150/70 | HR 111 | Temp 98.4°F | Resp 15 | Wt 227.4 lb

## 2013-03-23 DIAGNOSIS — W64XXXA Exposure to other animate mechanical forces, initial encounter: Secondary | ICD-10-CM | POA: Diagnosis not present

## 2013-03-23 DIAGNOSIS — R358 Other polyuria: Secondary | ICD-10-CM | POA: Diagnosis not present

## 2013-03-23 DIAGNOSIS — IMO0002 Reserved for concepts with insufficient information to code with codable children: Secondary | ICD-10-CM | POA: Diagnosis not present

## 2013-03-23 DIAGNOSIS — N138 Other obstructive and reflux uropathy: Secondary | ICD-10-CM | POA: Diagnosis not present

## 2013-03-23 DIAGNOSIS — M79609 Pain in unspecified limb: Secondary | ICD-10-CM | POA: Diagnosis not present

## 2013-03-23 DIAGNOSIS — N139 Obstructive and reflux uropathy, unspecified: Secondary | ICD-10-CM | POA: Diagnosis not present

## 2013-03-23 DIAGNOSIS — W5503XA Scratched by cat, initial encounter: Secondary | ICD-10-CM

## 2013-03-23 DIAGNOSIS — N401 Enlarged prostate with lower urinary tract symptoms: Secondary | ICD-10-CM | POA: Diagnosis not present

## 2013-03-23 DIAGNOSIS — R3589 Other polyuria: Secondary | ICD-10-CM | POA: Diagnosis not present

## 2013-03-23 DIAGNOSIS — M79622 Pain in left upper arm: Secondary | ICD-10-CM

## 2013-03-23 MED ORDER — AZITHROMYCIN 250 MG PO TABS: ORAL_TABLET | ORAL | Status: DC

## 2013-03-23 NOTE — Patient Instructions (Addendum)
The Sports Medicine  referral will be scheduled and you'll be notified of the time. Fill the  prescription for Zithromax it there is progression of red streaks, pain or fever as discussed

## 2013-03-23 NOTE — Progress Notes (Signed)
Pre visit review using our clinic review tool, if applicable. No additional management support is needed unless otherwise documented below in the visit note. 

## 2013-03-23 NOTE — Progress Notes (Signed)
   Subjective:    Patient ID: Todd Larson, male    DOB: 1940-05-15, 73 y.o.   MRN: [id number]  HPI   He was scratched by a stray cat either Friday evening 3/20 or Saturday morning 3/21. He believes a rabies tag was on the cat's collar. He feels the wound is healing without complication.  His tetanus shot is up-to-date  He's had no associated fever, chills, sweats,lymphangitis or pustule formation      Review of Systems  He has had physical therapy on 2 occasions for cervical radiculopathy symptoms. He has intermittent left triceps weakness & pain and questions need for referral to neck/back specialist.  He continues seeing the chiropractor on  once a month basis     Objective:   Physical Exam  He appears healthy and well-nourished  He has no lymphadenopathy about the neck, axilla, or epitrochlear area  The radial artery pulses are normal  The punctate wound over the thenar eminence right hand does not appear to be infected. There is no sign of lymphangitis.  Strength and tone in the upper extremities to opposition is actually normal.        Assessment & Plan:  #1 cat scratch without suggestion of clinical cellulitis  #2 intermittent triceps pain/weakness  Plan: See orders.

## 2013-03-30 ENCOUNTER — Ambulatory Visit (INDEPENDENT_AMBULATORY_CARE_PROVIDER_SITE_OTHER): Payer: Medicare Other | Admitting: Family Medicine

## 2013-03-30 ENCOUNTER — Encounter: Payer: Self-pay | Admitting: Family Medicine

## 2013-03-30 ENCOUNTER — Other Ambulatory Visit (INDEPENDENT_AMBULATORY_CARE_PROVIDER_SITE_OTHER): Payer: Medicare Other

## 2013-03-30 VITALS — BP 152/88 | HR 98 | Wt 226.0 lb

## 2013-03-30 DIAGNOSIS — M79609 Pain in unspecified limb: Secondary | ICD-10-CM | POA: Diagnosis not present

## 2013-03-30 DIAGNOSIS — M79622 Pain in left upper arm: Secondary | ICD-10-CM

## 2013-03-30 DIAGNOSIS — M542 Cervicalgia: Secondary | ICD-10-CM

## 2013-03-30 DIAGNOSIS — G8929 Other chronic pain: Secondary | ICD-10-CM | POA: Diagnosis not present

## 2013-03-30 NOTE — Progress Notes (Signed)
Todd Larson [address] [place], [place] [address] Phone: [phone] Subjective:    I'm seeing this patient by the request  of:  Unice Cobble, MD   CC: Left arm pain  MHD:[id number] Todd Larson is a 73 y.o. male coming in with complaint of left upper arm pain. Patient has had this pain for approximately 2 years. Patient has known degenerative disc disease at C5-C6. Patient states that at one point during the course of this he started having weakness of the triceps on the left side compared to his right side. Patient actually lost significant strength is quite some time but didn't start formal physical therapy which did seem to help. This was also on amitriptyline for quite some time which was helping as well but has not taken anymore. Patient states overall he seems to be doing very well but he has intermittent weakness of his triceps that occurs. Patient elected make sure that there is no true muscle injury and see if there is anything else that he can do. Patient denies any pain at baseline but does have some intermittent pain of the cervical neck. Patient has had difficulty with extension of his neck for multiple years. Patient denies any numbness though that is occurring at this time. Patient rates the severity of the problem has 6/10.     Past medical history, social, surgical and family history all reviewed in electronic medical record.   Review of Systems: No headache, visual changes, nausea, vomiting, diarrhea, constipation, dizziness, abdominal pain, skin rash, fevers, chills, night sweats, weight loss, swollen lymph nodes, body aches, joint swelling, muscle aches, chest pain, shortness of breath, mood changes.   Objective There were no vitals taken for this visit.  General: No apparent distress alert and oriented x3 mood and affect normal, dressed appropriately.  HEENT: Pupils equal, extraocular movements intact  Respiratory: Patient's speak in full  sentences and does not appear short of breath  Cardiovascular: No lower extremity edema, non tender, no erythema  Skin: Warm dry intact with no signs of infection or rash on extremities or on axial skeleton.  Abdomen: Soft nontender  Neuro: Cranial nerves II through XII are intact, neurovascularly intact in all extremities with 2+ DTRs and 2+ pulses.  Lymph: No lymphadenopathy of posterior or anterior cervical chain or axillae bilaterally.  Gait normal with good balance and coordination.  MSK:  Non tender with full range of motion and good stability and symmetric strength and tone of shoulders, elbows, wrist, hip, knee and ankles bilaterally.  Neck: Inspection shows patient does have significant anterior displacement of the neck mostly the C6-C7 area with increasing kyphosis of T1-T2. No palpable stepoffs. Positive Spurling's maneuver. Range of motion the patient's neck does have decreased extension to approximately 10 of full flexion with mild limitation of rotation bilaterally lacking the last 10 bilaterally. Good side bending Grip strength and sensation normal in bilateral hands Strength good C4 to T1 distribution, slight 4/5 strength of the C7 nerve root compared to the contralateral side No sensory change to C4 to T1 Negative Hoffman sign bilaterally Reflexes normal   Limited musculoskeletal ultrasound was performed and interpreted by Hulan Saas, M Limited ultrasound shows the patient's triceps tendon is intact at the olecranon with no hypoechoic changes. Patient does have hypertrophy of the supraspinatus. The rest patient's elbow is unremarkable. Impression: Intact normal triceps tendon.    Impression and Recommendations:     This case required medical decision making of moderate  complexity.

## 2013-03-30 NOTE — Patient Instructions (Signed)
Good to meet you Continue to go to the gym 3 times a week Concentrate on eccentric meaning going back slowly for count of 4.  This will be harder so drop weight by 50% and increase by 10 % a week.  Posture exercises with heels, butt and shoulder against wall and try to stand for total 3 minutes daily.  Heat before activity and ice afterwards could help Take tylenol 650 mg three times a day is the best evidence based medicine we have for arthritis.  Glucosamine sulfate 750mg  twice a day is a supplement that has been shown to help moderate to severe arthritis. Vitamin D 2000 IU daily Fish oil 2 grams daily.  Tumeric 500mg  twice daily.  Capsaicin topically up to four times a day may also help with pain. It's important that you continue to stay active. Controlling your weight is important.  Water aerobics and cycling with low resistance are the best two types of exercise for arthritis. Come back and see me in 3-4 weeks.

## 2013-03-30 NOTE — Assessment & Plan Note (Signed)
Discussed with patient at great length. We did patient multiple different options. Patient states that he is doing fairly well with continuing exercising 3 times a week. Patient did bring in the exercises that show that patient is doing good exercises but we'll have him to work on eccentric motions that he would be more beneficial. Patient also given some very mild posture exercises that could be helpful. Patient does have a home traction machine we discussed how this could be helpful without causing any discomfort. Patient will continue to see a chiropractor that he states he is seen one to 2 times a month. Discussed over-the-counter medications that could be beneficial for his underlying osteoarthritic changes. Patient can come back again in 3-6 weeks for further evaluation and treatment.

## 2013-04-15 ENCOUNTER — Encounter: Payer: Self-pay | Admitting: Internal Medicine

## 2013-04-15 ENCOUNTER — Ambulatory Visit (INDEPENDENT_AMBULATORY_CARE_PROVIDER_SITE_OTHER): Payer: Medicare Other | Admitting: Internal Medicine

## 2013-04-15 VITALS — BP 140/84 | HR 95 | Temp 98.4°F | Resp 13 | Wt 227.0 lb

## 2013-04-15 DIAGNOSIS — K089 Disorder of teeth and supporting structures, unspecified: Secondary | ICD-10-CM

## 2013-04-15 DIAGNOSIS — H9201 Otalgia, right ear: Secondary | ICD-10-CM

## 2013-04-15 DIAGNOSIS — H9209 Otalgia, unspecified ear: Secondary | ICD-10-CM

## 2013-04-15 DIAGNOSIS — K0889 Other specified disorders of teeth and supporting structures: Secondary | ICD-10-CM

## 2013-04-15 NOTE — Progress Notes (Signed)
Pre visit review using our clinic review tool, if applicable. No additional management support is needed unless otherwise documented below in the visit note. 

## 2013-04-15 NOTE — Patient Instructions (Signed)
Please do not use Q-tips as we discussed. Should wax build up occur, please put 2-3 drops of mineral oil in the affected  ear at night to soften the wax .Cover the canal with a  cotton ball to prevent the oil from staining bed linens. In the morning fill the ear canal with hydrogen peroxide & lie in the opposite lateral decubitus position(on the side opposite the affected ear)  for 10-15 minutes. After allowing this period of time for the peroxide to dissolve the wax ;shower and use the thinnest washrag available to wick out the wax. If both ears are involved ; alternate this treatment from ear to ear each night until no wax is found on the washrag.Plain Mucinex (NOT D) for thick secretions ;force NON dairy fluids .   Nasal cleansing in the shower as discussed with lather of mild shampoo.After 10 seconds wash off lather while  exhaling through nostrils. Make sure that all residual soap is removed to prevent irritation.  Flonase OR Nasacort AQ 1 spray in each nostril twice a day as needed. Use the "crossover" technique into opposite nostril spraying toward opposite ear @ 45 degree angle, not straight up into nostril.  Plain Allegra (NOT D )  160 daily , Loratidine 10 mg , OR Zyrtec 10 mg @ bedtime  as needed for itchy eyes & sneezing. Take the Amoxicillin for persistent or progressive fever; discolored nasal or chest secretions; or frontal headache or persistent facial  pain.

## 2013-04-15 NOTE — Progress Notes (Signed)
   Subjective:    Patient ID: Todd Larson, male    DOB: 11-30-1940, 73 y.o.   MRN: [id number]  HPI   Symptoms began 04/10/13 as aching in the right upper posterior molar area with associated swelling of the gum. He also noted some gritty discharge from the eye without frank purulence.  The symptoms were better the next day but worsened in 4/12. He saw his dentist for/13. X-rays showed no caries or abscess  He was placed on amoxicillin 500 mg 4 times a day.  With the antibiotics symptoms improved. He stopped the antibiotics and symptoms recurred  He continues to have some right posterior maxillary pain as well as right retroauricular pain intermittently.  Review of Systems  He denies frontal sinus or facial sinus area pain specifically. He also has no nasal purulence. He has no sore throat or otic discharge.  He is not having cough or sputum production.        Objective:   Physical Exam General appearance:good health ;well nourished; no acute distress or increased work of breathing is present.  No  lymphadenopathy about the head, neck, or axilla noted.   Eyes: No conjunctival inflammation or lid edema is present. There is no scleral icterus.  Ears:  External ear exam shows no significant lesions or deformities. There is cerumen in the right otic canal. The left tympanic membrane is normal. Clinically there is no evidence of mastoid tenderness on the right.  Nose:  External nasal examination shows no deformity or inflammation. Nasal mucosa are pink and moist without lesions or exudates. No septal dislocation or deviation.No obstruction to airflow.   Oral exam: Dental hygiene is good; lips and gums are healthy appearing.There is no oropharyngeal erythema or exudate noted. No evidence of TMJ tenderness or dislocation  Neck:  No deformities, masses, or tenderness noted.   Supple with full range of motion without pain.   Heart:  Normal rate and regular rhythm. S1 and S2 normal without  gallop, murmur, click, rub or other extra sounds.   Lungs:Chest clear to auscultation; no wheezes, rhonchi,rales ,or rubs present.No increased work of breathing.    Extremities:  No cyanosis, edema, or clubbing  noted    Skin: Warm & dry w/o jaundice or tenting.         Assessment & Plan:  #1 dental pain without evidence of caries  #2 ear pain possibly related to cerumen impaction.  Historically and clinically there is no evidence of rhinosinusitis at this time.  See AVS. Antibiotic will be held and aggressive nasal and otic hygiene pursued

## 2013-04-17 ENCOUNTER — Encounter: Payer: Self-pay | Admitting: Internal Medicine

## 2013-05-07 ENCOUNTER — Ambulatory Visit (INDEPENDENT_AMBULATORY_CARE_PROVIDER_SITE_OTHER): Payer: Medicare Other | Admitting: Family Medicine

## 2013-05-07 ENCOUNTER — Encounter: Payer: Self-pay | Admitting: Family Medicine

## 2013-05-07 VITALS — BP 140/82 | HR 102

## 2013-05-07 DIAGNOSIS — M542 Cervicalgia: Secondary | ICD-10-CM

## 2013-05-07 DIAGNOSIS — G8929 Other chronic pain: Secondary | ICD-10-CM | POA: Diagnosis not present

## 2013-05-07 NOTE — Patient Instructions (Signed)
Good to see you You are doing great! Vitamin D 2000 IU daily.  Try to avoid significant extension to the neck and back. Consider incline bench and posterior deltoids exercises.  %3 and never move then 10 pounds.  See me when you need me.

## 2013-05-07 NOTE — Progress Notes (Signed)
  [name] [address], [place] [address] Phone: [phone] Subjective:     CC: Left arm pain follow up  UUE:[id number] Todd Larson is a 73 y.o. male coming in with complaint of left upper arm pain. Patient has had this pain for approximately 2 years. Patient has known degenerative disc disease at C5-C6. Patient was given exercises as well as postural exercises at last visit. Patient states actually that seem to exacerbate the pain more.  Patient to states that the arm pain has improved overall. Patient did try to take some the over-the-counter medications but not for a long amount of time. Patient actually use some of another individuals and did not bind for himself. Patient still able to do all activities of daily living without any significant discomfort patient still states that there might be some mild weakness to. Patient of course wants to do the minimal amounts and would avoid surgery at all costs. No new symptoms other than exacerbation of old symptoms with the postural exercises.     Past medical history, social, surgical and family history all reviewed in electronic medical record.   Review of Systems: No headache, visual changes, nausea, vomiting, diarrhea, constipation, dizziness, abdominal pain, skin rash, fevers, chills, night sweats, weight loss, swollen lymph nodes, body aches, joint swelling, muscle aches, chest pain, shortness of breath, mood changes.   Objective Blood pressure 140/82, pulse 102, SpO2 97.00%.  General: No apparent distress alert and oriented x3 mood and affect normal, dressed appropriately.  HEENT: Pupils equal, extraocular movements intact  Respiratory: Patient's speak in full sentences and does not appear short of breath  Cardiovascular: No lower extremity edema, non tender, no erythema  Skin: Warm dry intact with no signs of infection or rash on extremities or on axial skeleton.  Abdomen: Soft nontender  Neuro:  Cranial nerves II through XII are intact, neurovascularly intact in all extremities with 2+ DTRs and 2+ pulses.  Lymph: No lymphadenopathy of posterior or anterior cervical chain or axillae bilaterally.  Gait normal with good balance and coordination.  MSK:  Non tender with full range of motion and good stability and symmetric strength and tone of shoulders, elbows, wrist, hip, knee and ankles bilaterally.  Neck: Inspection shows mild increased his lordosis of the cervical region and increased kyphosis of the thoracic region. No palpable stepoffs. Positive Spurling's maneuver. Range of motion the patient's neck does have decreased extension to approximately 10 of full flexion with mild limitation of rotation bilaterally lacking the last 10 bilaterally. Good side bending Grip strength and sensation normal in bilateral hands Strength good C4 to T1 distribution, slight 4/5 strength of the C7 nerve root compared to the contralateral side No sensory change to C4 to T1 Negative Hoffman sign bilaterally Reflexes normal No significant change from previous exam.    Impression and Recommendations:     This case required medical decision making of moderate complexity.

## 2013-05-07 NOTE — Assessment & Plan Note (Signed)
Patient overall is doing fairly well. Patient is still able to do all his activities of daily living. Patient continues to see a chiropractor on a fairly regular basis but has not noticed any significant improvement. Patient is adamant that he does not want any intervention such as epidural steroid injection or surgery at this time. Patient was shown other exercises but to avoid the postural exercises right now. Patient will continue the other recommended over-the-counter medicines and he may actually buy them himself. Specially encourage vitamin D. Other than this patient in followup on an as-needed basis. Patient doesn't know how to alleviate a significant amount of pressure and pain on his own. Patient has any trouble he can follow up with me.  Spent greater than 25 minutes with patient face-to-face and had greater than 50% of counseling including as described above in assessment and plan.

## 2013-05-12 ENCOUNTER — Ambulatory Visit (INDEPENDENT_AMBULATORY_CARE_PROVIDER_SITE_OTHER): Payer: Medicare Other | Admitting: Internal Medicine

## 2013-05-12 ENCOUNTER — Encounter: Payer: Self-pay | Admitting: Internal Medicine

## 2013-05-12 VITALS — BP 124/74 | HR 110 | Temp 98.4°F | Wt 227.2 lb

## 2013-05-12 DIAGNOSIS — N509 Disorder of male genital organs, unspecified: Secondary | ICD-10-CM

## 2013-05-12 DIAGNOSIS — R194 Change in bowel habit: Secondary | ICD-10-CM

## 2013-05-12 DIAGNOSIS — R198 Other specified symptoms and signs involving the digestive system and abdomen: Secondary | ICD-10-CM | POA: Diagnosis not present

## 2013-05-12 DIAGNOSIS — IMO0002 Reserved for concepts with insufficient information to code with codable children: Secondary | ICD-10-CM

## 2013-05-12 NOTE — Progress Notes (Signed)
   Subjective:    Patient ID: Todd Larson, male    DOB: 1940-02-27, 73 y.o.   MRN: [id number]  HPI   For > 1 week he's had discomfort in the left scrotal area in the context of the testicle being elevated. Walking & exercise aggravates his symptoms. The symptoms do vary in intensity throughout the day.  There was no obvious trigger or injury for this; but he sustained a hockey injury remotely in this area  During this period time he's had some irregularity in his bowels with more frequent, loose stools   Review of Systems  He denies fever, chills, sweats, weight loss  He has no dysuria, pyuria, or hematuria  There is no associated abdominal pain, melena or rectal bleeding       Objective:   Physical Exam  The left testicle is difficult to palpate as it does ride high in the scrotum. No definite hernia is noted  Abdomen is protuberant but soft. Umbilical hernia is present  Straight leg raising is negative  He has no inguinal lymphadenopathy  No skin changes in temperature or color;no rash  noted        Assessment & Plan:  #1 scrotal pain related to elevated left testicle. R/O torsion  #2 changes in bowels secondary to #1  Plan: Urologic assessment

## 2013-05-12 NOTE — Progress Notes (Signed)
Pre visit review using our clinic review tool, if applicable. No additional management support is needed unless otherwise documented below in the visit note. 

## 2013-05-12 NOTE — Patient Instructions (Signed)
The Urology  referral will be scheduled and you'll be notified of the time. 

## 2013-05-20 DIAGNOSIS — N453 Epididymo-orchitis: Secondary | ICD-10-CM | POA: Diagnosis not present

## 2013-05-27 DIAGNOSIS — N509 Disorder of male genital organs, unspecified: Secondary | ICD-10-CM | POA: Diagnosis not present

## 2013-06-04 DIAGNOSIS — N453 Epididymo-orchitis: Secondary | ICD-10-CM | POA: Diagnosis not present

## 2013-06-16 ENCOUNTER — Ambulatory Visit (INDEPENDENT_AMBULATORY_CARE_PROVIDER_SITE_OTHER): Payer: Medicare Other | Admitting: Internal Medicine

## 2013-06-16 ENCOUNTER — Encounter: Payer: Self-pay | Admitting: Internal Medicine

## 2013-06-16 ENCOUNTER — Other Ambulatory Visit: Payer: Medicare Other

## 2013-06-16 VITALS — BP 146/80 | HR 98 | Temp 99.0°F | Wt 225.2 lb

## 2013-06-16 DIAGNOSIS — R8290 Unspecified abnormal findings in urine: Secondary | ICD-10-CM

## 2013-06-16 DIAGNOSIS — K4090 Unilateral inguinal hernia, without obstruction or gangrene, not specified as recurrent: Secondary | ICD-10-CM

## 2013-06-16 DIAGNOSIS — E739 Lactose intolerance, unspecified: Secondary | ICD-10-CM | POA: Diagnosis not present

## 2013-06-16 DIAGNOSIS — R3129 Other microscopic hematuria: Secondary | ICD-10-CM | POA: Diagnosis not present

## 2013-06-16 DIAGNOSIS — R82998 Other abnormal findings in urine: Secondary | ICD-10-CM | POA: Diagnosis not present

## 2013-06-16 LAB — POCT URINALYSIS DIPSTICK
BILIRUBIN UA: NEGATIVE
Glucose, UA: NEGATIVE
Ketones, UA: NEGATIVE
Leukocytes, UA: NEGATIVE
Nitrite, UA: NEGATIVE
PH UA: 5
RBC UA: NEGATIVE
Spec Grav, UA: >=1.03
UROBILINOGEN UA: 0.2

## 2013-06-16 NOTE — Progress Notes (Signed)
   Subjective:    Patient ID: Todd Larson, male    DOB: [birth year], 73 y.o.   MRN: [id number]  HPI  He has seen his Urologist, Dr. Burnell Blanks who has recommended orchiectomy to treat the recurrent left inguinal discomfort  He has discomfort at night when supine. During the day he notes that his testicle descends and by 6 PM the discomfort has resolved. It does recur overnight, present upon arising  This is in the context of remote injury to this testicle from a hockey accident fifty years ago.  An incidental finding @ that urology visit was microscopic hematuria for which he took ciprofloxacin for 10 days.   Review of Systems  He was told by another MD  to drink chocolate milk after exercises but this seemed to cause increased gas and aggravated the inguinal pain. Stopping the milk has resulted in improvement in the symptoms    He denies fever, chills, sweats, weight loss.  He also denies dysuria, pyuria, or hematuria.     Objective:   Physical Exam   He appears healthy well-nourished. There is some central weight excess.  No conjunctivitis or scleral icterus  Abdomens protuberant. There's no tenderness in the lower quadrants to palpation.   There is no inguinal lymphadenopathy  The left testicle is smaller than the right. I cannot appreciate a definite hernia.    Tone, strength, are normal.  Gait is normal as well       Assessment & Plan:  #1 left inguinal discomfort probably representing herniation of the left testicle at night when supine. Symptoms are improved during the  when he is ambulatory suggesting absence of post traumatic scar tissue immobilizing the testicle. # 2asymptomatic microscopic hematuria  #3 lactose intolerance, improved off milk   Plan: I've recommended he seek a second opinion at [facility] concerning options other than orchiectomy as the discomfort is intermittent and positional rather than fixed.

## 2013-06-16 NOTE — Patient Instructions (Signed)
   Please contact your insurance company to determine which is the center of excellence on your plan concerning this Urologic issue.

## 2013-06-16 NOTE — Progress Notes (Signed)
Pre visit review using our clinic review tool, if applicable. No additional management support is needed unless otherwise documented below in the visit note. 

## 2013-06-18 LAB — URINE CULTURE: Colony Count: 1000

## 2013-07-10 ENCOUNTER — Other Ambulatory Visit: Payer: Self-pay

## 2013-07-10 DIAGNOSIS — J309 Allergic rhinitis, unspecified: Principal | ICD-10-CM

## 2013-07-10 DIAGNOSIS — H1010 Acute atopic conjunctivitis, unspecified eye: Secondary | ICD-10-CM

## 2013-07-10 MED ORDER — FLUTICASONE PROPIONATE 50 MCG/ACT NA SUSP: 1.0000 | Freq: Two times a day (BID) | NASAL | Status: DC | PRN

## 2013-08-02 ENCOUNTER — Other Ambulatory Visit: Payer: Self-pay | Admitting: Internal Medicine

## 2013-09-09 ENCOUNTER — Ambulatory Visit (INDEPENDENT_AMBULATORY_CARE_PROVIDER_SITE_OTHER): Payer: Medicare Other | Admitting: Internal Medicine

## 2013-09-09 ENCOUNTER — Encounter: Payer: Self-pay | Admitting: Internal Medicine

## 2013-09-09 VITALS — BP 140/76 | HR 104 | Temp 98.6°F | Wt 223.0 lb

## 2013-09-09 DIAGNOSIS — J301 Allergic rhinitis due to pollen: Secondary | ICD-10-CM

## 2013-09-09 DIAGNOSIS — J029 Acute pharyngitis, unspecified: Secondary | ICD-10-CM | POA: Diagnosis not present

## 2013-09-09 MED ORDER — AZITHROMYCIN 250 MG PO TABS: ORAL_TABLET | ORAL | Status: DC

## 2013-09-09 MED ORDER — MONTELUKAST SODIUM 10 MG PO TABS: 10.0000 mg | ORAL_TABLET | Freq: Every day | ORAL | Status: DC

## 2013-09-09 NOTE — Progress Notes (Signed)
Pre visit review using our clinic review tool, if applicable. No additional management support is needed unless otherwise documented below in the visit note. 

## 2013-09-09 NOTE — Progress Notes (Signed)
   Subjective:    Patient ID: Todd Larson, male    DOB: 1940-12-26, 73 y.o.   MRN: [id number]  HPI   He has had rhinitis symptoms along with head congestion for the past 3-4 days. Yesterday began to have sore throat. He also has had itchy, watery eyes, and sneezing. Cough has been minimal.  He believes he has had pollen exposures as a trigger. He has been using Flonase as well as gargling with salt water. He has used some topical  OTC eye drops for itchy eyes  Now the sore throat is intermittent and he has had some pressure in ears.  He would not have come in except he is flying to [place], [place] to see  His elderly  Mother &  did not want her to possibly become ill.   Review of Systems  He specifically denies significant frontal headache, facial pain, nasal purulence, otic discharge, purulent sputum, wheezing, shortness of breath, fever, chills, or sweats.       Objective:   Physical Exam  Pertinent or positive findings include: There is minimal erythema of the posterior pharynx; no exudates are present. He has no cervical tender lymphadenopathy.  General appearance:good health ;well nourished; no acute distress or increased work of breathing is present.  No  lymphadenopathy about the head, neck, or axilla noted.   Eyes: No conjunctival inflammation or lid edema is present. There is no scleral icterus.  Ears:  External ear exam shows no significant lesions or deformities.  Otoscopic examination reveals clear canals, tympanic membranes are intact bilaterally without bulging, retraction, inflammation or discharge.  Nose:  External nasal examination shows no deformity or inflammation. Nasal mucosa are pink and moist without lesions or exudates. No septal dislocation or deviation.No obstruction to airflow.   Oral exam: Dental hygiene is good; lips and gums are healthy appearing.  Neck:  No deformities, thyromegaly, masses, or tenderness noted.   Supple with full range of motion  without pain.   Heart:  Normal rate and regular rhythm. S1 and S2 normal without gallop, murmur, click, rub or other extra sounds.   Lungs:Chest clear to auscultation; no wheezes, rhonchi,rales ,or rubs present.No increased work of breathing.    Extremities:  No cyanosis, edema, or clubbing  noted    Skin: Warm & dry w/o jaundice or tenting.         Assessment & Plan:  #1 extrinsic rhinitis  #2 sore throat secondary to #1  Plan: See orders and recommendations

## 2013-09-09 NOTE — Patient Instructions (Signed)
Zicam Melts or Zinc lozenges as per package label for sore throat . Complementary options include  vitamin C 2000 mg daily; & Echinacea for 4-7 days. Report persistent or progressive fever; discolored nasal or chest secretions; or frontal headache or facial  pain.     Plain Mucinex (NOT D) for thick secretions ;force NON dairy fluids .   Nasal cleansing in the shower as discussed with lather of mild shampoo.After 10 seconds wash off lather while  exhaling through nostrils. Make sure that all residual soap is removed to prevent irritation.  Flonase OR Nasacort AQ 1 spray in each nostril twice a day as needed. Use the "crossover" technique into opposite nostril spraying toward opposite ear @ 45 degree angle, not straight up into nostril.  Use a Neti pot daily only  as needed for significant sinus congestion; going from open side to congested side . Plain Allegra (NOT D )  160 daily , Loratidine 10 mg , OR Zyrtec 10 mg @ bedtime  as needed for itchy eyes & sneezing.  Fill the  prescription for antibiotic it there is not dramatic improvement in the next 72 hours.

## 2013-09-16 ENCOUNTER — Telehealth: Payer: Self-pay | Admitting: *Deleted

## 2013-09-16 ENCOUNTER — Encounter: Payer: Self-pay | Admitting: Internal Medicine

## 2013-09-16 NOTE — Telephone Encounter (Signed)
Left msg on triage stating injured his back some kind away made appt to see Dr. Linna Darner tomorrow @ 4:30. Saw chiropractor this afternoon & he think he is having muscle spasm. Dr. Linna Darner gave him some amitriptyline in the past wanting to know will it be ok for him to take...Todd Larson

## 2013-09-16 NOTE — Telephone Encounter (Signed)
qhs

## 2013-09-16 NOTE — Telephone Encounter (Signed)
Notified pt with md response.../lmb 

## 2013-09-17 ENCOUNTER — Encounter: Payer: Self-pay | Admitting: Internal Medicine

## 2013-09-17 ENCOUNTER — Ambulatory Visit (INDEPENDENT_AMBULATORY_CARE_PROVIDER_SITE_OTHER): Payer: Medicare Other | Admitting: Internal Medicine

## 2013-09-17 VITALS — BP 122/70 | HR 94 | Temp 98.5°F | Wt 223.0 lb

## 2013-09-17 DIAGNOSIS — M62838 Other muscle spasm: Secondary | ICD-10-CM

## 2013-09-17 DIAGNOSIS — R3589 Other polyuria: Secondary | ICD-10-CM | POA: Diagnosis not present

## 2013-09-17 DIAGNOSIS — M5412 Radiculopathy, cervical region: Secondary | ICD-10-CM | POA: Diagnosis not present

## 2013-09-17 DIAGNOSIS — R358 Other polyuria: Secondary | ICD-10-CM | POA: Diagnosis not present

## 2013-09-17 DIAGNOSIS — R972 Elevated prostate specific antigen [PSA]: Secondary | ICD-10-CM | POA: Diagnosis not present

## 2013-09-17 MED ORDER — TRAMADOL HCL 50 MG PO TABS: ORAL_TABLET | ORAL | Status: DC

## 2013-09-17 MED ORDER — CYCLOBENZAPRINE HCL 5 MG PO TABS: 5.0000 mg | ORAL_TABLET | ORAL | Status: DC

## 2013-09-17 NOTE — Progress Notes (Signed)
   Subjective:    Patient ID: Todd Larson, male    DOB: 1940-05-05, 73 y.o.   MRN: [id number]  HPI  On 09/11/13 at as well as 09/14/13 he had pain in his neck and shoulder area after carrying a bag with a strap over the left shoulder area.  The pain radiates to the left arm as far as the wrist  He's also had some tingling in his fingertips on that side  Driving or overhead use of his hands aggravates it. It does improve with supine position  Amitriptyline at low dose has been partially of benefit  He did see his chiropractor who manipulated him with some response.   He's been using the elastic devices to perform exercises as recommended by physical therapy for his neck. He is not having fever or chills sweats or weight loss  No loss of control of bladder or bowels.       Review of Systems    His extrinsic allergic symptoms have resolved at this time and he questions whether he can stop the generic Singulair. The symptoms resolved after being in [place], [place] when rains eliminated the pollen triggers.      Objective:   Physical Exam  Pertinent positive findings include: He has marked decreased range of motion of the cervical spine laterally in either direction. No abnormality of range of motion of the upper extremities is noted otherwise. Strength to opposition is normal in the upper extremities. Abdomen is markedly protuberant; he has a large umbilical hernia.  General appearance :adequately nourished; in no distress. Eyes: No conjunctival inflammation or scleral icterus is present. Heart:  Normal rate and regular rhythm. S1 and S2 normal without gallop, murmur, click, rub or other extra sounds   Lungs:Chest clear to auscultation; no wheezes, rhonchi,rales ,or rubs present.No increased work of breathing.  Skin:Warm & dry.  Intact without suspicious lesions or rashes ; no jaundice  Lymphatic: No lymphadenopathy is noted about the head, neck, axilla.            Assessment & Plan:  #2 associated muscle spasm  Plan: Tramadol be initiated every 6 hours as needed. This will eliminate the ability to take amitriptyline. Low-dose cyclobenzaprine will be ordered at bedtime as needed.  #1 cervical radiculopathy at C7  #2 associated upper back muscle spasm  #3 extrinsic rhinoconjunctivitis, resolved. Generic Sinemet can be discontinued  Plan: Amitriptyline will be held because tramadol will be prescribed one half to one every 6 hours as needed. Low-dose cyclobenzaprine at bedtime will also be prescribed.

## 2013-09-17 NOTE — Patient Instructions (Addendum)
Use an anti-inflammatory cream such as Aspercreme or Zostrix cream twice a day to the affected area as needed. In lieu of this warm moist compresses or  hot water bottle can be used. Do not apply ice .  Tramadol 50 mg one half-1 every 6-8 hours as needed during the day.

## 2013-09-17 NOTE — Progress Notes (Signed)
Pre visit review using our clinic review tool, if applicable. No additional management support is needed unless otherwise documented below in the visit note. 

## 2013-09-21 ENCOUNTER — Encounter: Payer: Self-pay | Admitting: Internal Medicine

## 2013-09-23 ENCOUNTER — Encounter: Payer: Self-pay | Admitting: Family Medicine

## 2013-09-23 ENCOUNTER — Ambulatory Visit (INDEPENDENT_AMBULATORY_CARE_PROVIDER_SITE_OTHER): Payer: Medicare Other | Admitting: Family Medicine

## 2013-09-23 ENCOUNTER — Other Ambulatory Visit (INDEPENDENT_AMBULATORY_CARE_PROVIDER_SITE_OTHER): Payer: Medicare Other

## 2013-09-23 ENCOUNTER — Ambulatory Visit (INDEPENDENT_AMBULATORY_CARE_PROVIDER_SITE_OTHER)
Admission: RE | Admit: 2013-09-23 | Discharge: 2013-09-23 | Disposition: A | Discharge status: Home / Self Care | Payer: Medicare Other | Source: Ambulatory Visit | Attending: Family Medicine | Admitting: Family Medicine

## 2013-09-23 VITALS — BP 132/70 | HR 103 | Ht 70.0 in | Wt 224.0 lb

## 2013-09-23 DIAGNOSIS — M25512 Pain in left shoulder: Secondary | ICD-10-CM

## 2013-09-23 DIAGNOSIS — M542 Cervicalgia: Secondary | ICD-10-CM

## 2013-09-23 DIAGNOSIS — M4120 Other idiopathic scoliosis, site unspecified: Secondary | ICD-10-CM | POA: Diagnosis not present

## 2013-09-23 DIAGNOSIS — M67919 Unspecified disorder of synovium and tendon, unspecified shoulder: Secondary | ICD-10-CM | POA: Diagnosis not present

## 2013-09-23 DIAGNOSIS — M25519 Pain in unspecified shoulder: Secondary | ICD-10-CM | POA: Diagnosis not present

## 2013-09-23 DIAGNOSIS — M7552 Bursitis of left shoulder: Secondary | ICD-10-CM | POA: Insufficient documentation

## 2013-09-23 DIAGNOSIS — G8929 Other chronic pain: Secondary | ICD-10-CM | POA: Diagnosis not present

## 2013-09-23 DIAGNOSIS — M47812 Spondylosis without myelopathy or radiculopathy, cervical region: Secondary | ICD-10-CM | POA: Diagnosis not present

## 2013-09-23 DIAGNOSIS — M47814 Spondylosis without myelopathy or radiculopathy, thoracic region: Secondary | ICD-10-CM | POA: Diagnosis not present

## 2013-09-23 DIAGNOSIS — M719 Bursopathy, unspecified: Secondary | ICD-10-CM

## 2013-09-23 NOTE — Assessment & Plan Note (Signed)
Patient is likely having an exacerbation of the underlying osteoarthritic changes. X-rays were ordered but and interpreted by me today. Patient has had no progression of the cervical arthritis that we seen on previous films. Patient still has significant narrowing though at the C4-C5 level as well C5-C6 level. Including an MRI and see if patient would benefit from an epidural injection. Patient was given a shoulder injection today for diagnostic and as well as hopefully therapeutic purposes. Patient continues to have pain we'll send formal physical therapy as well as probably move forward on imaging.  Spent greater than 25 minutes with patient face-to-face and had greater than 50% of counseling including as described above in assessment and plan.

## 2013-09-23 NOTE — Patient Instructions (Signed)
Good to see you I think your shoulder was giving you some trouble.  Ice 20 minutes 2 times daily. Usually after activity and before bed. Exercises 3 times a week.  Call me in 1 week if not better we will start physical therapy for your neck.  New xrays downstairs today.  See me again in 3-4 weeks.

## 2013-09-23 NOTE — Assessment & Plan Note (Signed)
With patient having a negative Spurling's test today and patient having findings on ultrasound as well as physical exam of impingement we did try a woefully therapeutic as well as diagnostic injection of the shoulder. Patient had mild improvement but did not completely resolve his shoulder pain. Patient given home exercise program as well as an icing regimen. Patient continues to have pain we'll focus more on his neck.

## 2013-09-23 NOTE — Progress Notes (Signed)
[name] [address], [place] [address] Phone: [phone] Subjective:     CC: Left arm pain follow up  [id number] Todd Larson is a 73 y.o. male coming in with complaint of left upper arm pain. Patient has had this pain for approximately 2 years. Patient has known degenerative disc disease at C5-C6. Patient was given exercises as well as postural exercises at last visit. She continues to go see chiropractor a regular basis. Patient states that he started having increasing pain going down the left arm again. Patient states that the chiropractor does cause some resolution the pain but it never seems to go away. States that it seems to be right where his shoulder needs his neck. States that he was traveling and had a Carreon that he put on his left shoulder. Describes the pain as a dull aching sensation that can radiate down towards his hands sometimes. Denies any weakness. States it has been very difficult to get comfortable at night. Patient has been taking the medications and was given to him by his primary care provider. Patient is concerned overall that it is not improving.    Past medical history, social, surgical and family history all reviewed in electronic medical record.   Review of Systems: No headache, visual changes, nausea, vomiting, diarrhea, constipation, dizziness, abdominal pain, skin rash, fevers, chills, night sweats, weight loss, swollen lymph nodes, body aches, joint swelling, muscle aches, chest pain, shortness of breath, mood changes.   Objective Blood pressure 132/70, pulse 103, height 5\' 10"  (1.778 m), weight 224 lb (101.606 kg), SpO2 97.00%.  General: No apparent distress alert and oriented x3 mood and affect normal, dressed appropriately.  HEENT: Pupils equal, extraocular movements intact  Respiratory: Patient's speak in full sentences and does not appear short of breath  Cardiovascular: No lower extremity edema, non tender,  no erythema  Skin: Warm dry intact with no signs of infection or rash on extremities or on axial skeleton.  Abdomen: Soft nontender  Neuro: Cranial nerves II through XII are intact, neurovascularly intact in all extremities with 2+ DTRs and 2+ pulses.  Lymph: No lymphadenopathy of posterior or anterior cervical chain or axillae bilaterally.  Gait normal with good balance and coordination.  MSK:  Non tender with full range of motion and good stability and symmetric strength and tone of shoulders, elbows, wrist, hip, knee and ankles bilaterally.  Neck: Inspection shows mild increased his lordosis of the cervical region and increased kyphosis of the thoracic region. No palpable stepoffs. Negative Spurling's maneuver. Range of motion the patient's neck does have decreased extension to approximately 10 of full flexion with mild limitation of rotation bilaterally lacking the last 10 bilaterally. Good side bending Grip strength and sensation normal in bilateral hands Strength good C4 to T1 distribution, slight 4/5 strength of the C7 nerve root compared to the contralateral side No sensory change to C4 to T1 Negative Hoffman sign bilaterally Reflexes normal No significant change from previous exam.  Shoulder: left Inspection reveals no abnormalities, atrophy or asymmetry. Palpation is normal with no tenderness over AC joint or bicipital groove. ROM is full in all planes passively. Rotator cuff strength normal throughout. signs of impingement with positive Neer and Hawkin's tests, but negative empty can sign. Speeds and Yergason's tests normal. No labral pathology noted with negative Obrien's, negative clunk and good stability. Normal scapular function observed. No painful arc and no drop arm sign. No apprehension sign  MSK US performed of:  left This study was ordered, performed, and interpreted by Charlann Boxer D.O.  Shoulder:   Supraspinatus:  Appears normal on long and transverse views,  Bursal bulge seen with shoulder abduction on impingement view. Infraspinatus:  Appears normal on long and transverse views. Significant increase in Doppler flow Subscapularis:  Appears normal on long and transverse views. Positive bursa Teres Minor:  Appears normal on long and transverse views. AC joint:  Capsule undistended, no geyser sign. Glenohumeral Joint:  Appears normal without effusion. Glenoid Labrum:  Intact without visualized tears. Biceps Tendon:  Appears normal on long and transverse views, no fraying of tendon, tendon located in intertubercular groove, no subluxation with shoulder internal or external rotation.  Impression: Subacromial bursitis  Procedure: Real-time Ultrasound Guided Injection of left glenohumeral joint Device: GE Logiq E  Ultrasound guided injection is preferred based studies that show increased duration, increased effect, greater accuracy, decreased procedural pain, increased response rate with ultrasound guided versus blind injection.  Verbal informed consent obtained.  Time-out conducted.  Noted no overlying erythema, induration, or other signs of local infection.  Skin prepped in a sterile fashion.  Local anesthesia: Topical Ethyl chloride.  With sterile technique and under real time ultrasound guidance:  Joint visualized.  23g 1  inch needle inserted posterior approach. Pictures taken for needle placement. Patient did have injection of 2 cc of 1% lidocaine, 2 cc of 0.5% Marcaine, and 1.0 cc of Kenalog 40 mg/dL. Completed without difficulty  Pain immediately resolved suggesting accurate placement of the medication.  Advised to call if fevers/chills, erythema, induration, drainage, or persistent bleeding.  Images permanently stored and available for review in the ultrasound unit.  Impression: Technically successful ultrasound guided injection.     Impression and Recommendations:     This case required medical decision making of moderate  complexity.

## 2013-09-30 ENCOUNTER — Encounter: Payer: Self-pay | Admitting: Family Medicine

## 2013-10-01 ENCOUNTER — Other Ambulatory Visit: Payer: Self-pay | Admitting: Family Medicine

## 2013-10-01 ENCOUNTER — Encounter: Payer: Self-pay | Admitting: Internal Medicine

## 2013-10-01 DIAGNOSIS — M5010 Cervical disc disorder with radiculopathy, unspecified cervical region: Secondary | ICD-10-CM

## 2013-10-06 ENCOUNTER — Ambulatory Visit
Admission: RE | Admit: 2013-10-06 | Discharge: 2013-10-06 | Disposition: A | Discharge status: Home / Self Care | Payer: Medicare Other | Source: Ambulatory Visit | Attending: Family Medicine | Admitting: Family Medicine

## 2013-10-06 ENCOUNTER — Other Ambulatory Visit: Payer: Self-pay | Admitting: Pediatrics

## 2013-10-06 ENCOUNTER — Encounter: Payer: Self-pay | Admitting: Family Medicine

## 2013-10-06 ENCOUNTER — Other Ambulatory Visit: Payer: Self-pay | Admitting: Family Medicine

## 2013-10-06 ENCOUNTER — Ambulatory Visit
Admission: RE | Admit: 2013-10-06 | Discharge: 2013-10-06 | Disposition: A | Discharge status: Home / Self Care | Payer: Medicare Other | Source: Ambulatory Visit | Attending: Pediatrics | Admitting: Pediatrics

## 2013-10-06 DIAGNOSIS — T1590XA Foreign body on external eye, part unspecified, unspecified eye, initial encounter: Secondary | ICD-10-CM

## 2013-10-06 DIAGNOSIS — Z135 Encounter for screening for eye and ear disorders: Secondary | ICD-10-CM | POA: Diagnosis not present

## 2013-10-06 DIAGNOSIS — M5010 Cervical disc disorder with radiculopathy, unspecified cervical region: Secondary | ICD-10-CM

## 2013-10-06 DIAGNOSIS — M5022 Other cervical disc displacement, mid-cervical region: Secondary | ICD-10-CM | POA: Diagnosis not present

## 2013-10-06 DIAGNOSIS — M47812 Spondylosis without myelopathy or radiculopathy, cervical region: Secondary | ICD-10-CM | POA: Diagnosis not present

## 2013-10-07 ENCOUNTER — Ambulatory Visit: Payer: Medicare Other | Admitting: Family Medicine

## 2013-10-07 ENCOUNTER — Ambulatory Visit: Payer: Medicare Other | Admitting: Internal Medicine

## 2013-10-09 ENCOUNTER — Ambulatory Visit (INDEPENDENT_AMBULATORY_CARE_PROVIDER_SITE_OTHER): Payer: Medicare Other | Admitting: Family Medicine

## 2013-10-09 ENCOUNTER — Encounter: Payer: Self-pay | Admitting: Family Medicine

## 2013-10-09 VITALS — BP 118/70 | HR 105 | Ht 68.0 in | Wt 220.0 lb

## 2013-10-09 DIAGNOSIS — M4722 Other spondylosis with radiculopathy, cervical region: Secondary | ICD-10-CM

## 2013-10-09 DIAGNOSIS — M47812 Spondylosis without myelopathy or radiculopathy, cervical region: Secondary | ICD-10-CM | POA: Diagnosis not present

## 2013-10-09 DIAGNOSIS — M5412 Radiculopathy, cervical region: Secondary | ICD-10-CM | POA: Diagnosis not present

## 2013-10-09 MED ORDER — GABAPENTIN 100 MG PO CAPS: 100.0000 mg | ORAL_CAPSULE | Freq: Three times a day (TID) | ORAL | Status: DC

## 2013-10-09 NOTE — Progress Notes (Signed)
[facility] [place], [place] [address] Phone: [phone] Subjective:     CC: Left arm pain follow up  Todd Larson is a 73 y.o. male coming in with complaint of left upper arm pain. Patient continued to have upper arm pain. Patient has been seen for this previously. Patient did get somewhat better with formal physical therapy. Patient states though he was having a much worse time and was noticing some with the upper extremity. Patient was sent for an MRI and is here to discuss results the next options. Patient's MRI shows some mild to moderate central canal narrowing most notably at C5-C6 and does have moderate severe foraminal narrowing on the left of multiple different levels. Patient states that the pain continues to be fairly intermittent but is improving slowly. Patient is taking tramadol and 3 times a day as well as cyclobenzaprine. Patient would still say that this is affecting his daily activities. Having significant difficulty with sleeping even with changing pillows as well as position.    Past medical history, social, surgical and family history all reviewed in electronic medical record.   Review of Systems: No headache, visual changes, nausea, vomiting, diarrhea, constipation, dizziness, abdominal pain, skin rash, fevers, chills, night sweats, weight loss, swollen lymph nodes, body aches, joint swelling, muscle aches, chest pain, shortness of breath, mood changes.   Objective Blood pressure 118/70, pulse 105, height 5\' 8"  (1.727 m), weight 220 lb (99.791 kg), SpO2 98.00%.  General: No apparent distress alert and oriented x3 mood and affect normal, dressed appropriately.  HEENT: Pupils equal, extraocular movements intact  Respiratory: Patient's speak in full sentences and does not appear short of breath  Cardiovascular: No lower extremity edema, non tender, no erythema  Skin: Warm dry intact with no signs of infection or rash  on extremities or on axial skeleton.  Abdomen: Soft nontender  Neuro: Cranial nerves II through XII are intact, neurovascularly intact in all extremities with 2+ DTRs and 2+ pulses.  Lymph: No lymphadenopathy of posterior or anterior cervical chain or axillae bilaterally.  Gait normal with good balance and coordination.  MSK:  Non tender with full range of motion and good stability and symmetric strength and tone of shoulders, elbows, wrist, hip, knee and ankles bilaterally.  Neck: Inspection shows mild increased his lordosis of the cervical region and increased kyphosis of the thoracic region. No palpable stepoffs. Negative Spurling's maneuver. Range of motion the patient's neck does have decreased extension to approximately 10 of full flexion with mild limitation of rotation bilaterally lacking the last 10 bilaterally. Good side bending Grip strength and sensation normal in bilateral hands Strength good C4 to T1 distribution, slight 4/5 strength of the C7 nerve root compared to the contralateral side no significant change from previous exam No sensory change to C4 to T1 Negative Hoffman sign bilaterally Reflexes normal No significant change from previous exam.  Shoulder: left Inspection reveals no abnormalities, atrophy or asymmetry. Palpation is normal with no tenderness over AC joint or bicipital groove. ROM is full in all planes passively. Rotator cuff strength normal throughout. signs of impingement with positive Neer and Hawkin's tests, but negative empty can sign. Better than previous exam Speeds and Yergason's tests normal. No labral pathology noted with negative Obrien's, negative clunk and good stability. Normal scapular function observed. No painful arc and no drop arm sign. No apprehension sign Contralateral side unremarkable     Impression and Recommendations:  This case required medical decision making of moderate complexity.

## 2013-10-09 NOTE — Patient Instructions (Signed)
Good to see you Physical therapy will be calling you.  Ice is your friend Gabapentin 100mg  nightly for first week then 200mg  nightly for second week then 300mg  at night thereafter.  Monitor at eye level.  Stand on wall with heels, butt, shoulder and head touching 5 minutes daily.  Tylenol 500mg  three times daily See me again in 4-6 weeks. If worsen too soon then write me and we will get epidural earlier.

## 2013-10-10 DIAGNOSIS — M4722 Other spondylosis with radiculopathy, cervical region: Secondary | ICD-10-CM | POA: Insufficient documentation

## 2013-10-10 NOTE — Assessment & Plan Note (Signed)
Discussed with patient at great length. Patient has a very minimal improvement if any. Patient has previously responded to formal physical therapy in the past and like to try this again. We will refer patient for physical therapy. I also discussed different treatment options including epidural steroid injection which patient declined at this time. We did start patient on gabapentin to see if this will help with some of the radicular symptoms patient is having. I gave patient different exercises working on range of motion and stretching. We discussed what activities can actually affect or make this worse. We discussed over-the-counter medications a consult progression. We discussed the Karie Chimera tried other possible treatments such as the injection in the shoulder which did not seem to make any significant improvement. Patient and will come back and see me again in 4-6 weeks to make sure he continues to improve will call sooner and we'll consider ordering epidural.  Spent greater than 25 minutes with patient face-to-face and had greater than 50% of counseling including as described above in assessment and plan.

## 2013-10-13 ENCOUNTER — Encounter: Payer: Self-pay | Admitting: Family Medicine

## 2013-10-16 ENCOUNTER — Encounter: Payer: Self-pay | Admitting: Family Medicine

## 2013-10-20 ENCOUNTER — Ambulatory Visit: Payer: Medicare Other | Attending: Internal Medicine | Admitting: Physical Therapy

## 2013-10-20 DIAGNOSIS — M5012 Cervical disc disorder with radiculopathy, mid-cervical region: Secondary | ICD-10-CM | POA: Insufficient documentation

## 2013-10-20 DIAGNOSIS — M25512 Pain in left shoulder: Secondary | ICD-10-CM | POA: Insufficient documentation

## 2013-10-20 DIAGNOSIS — M7552 Bursitis of left shoulder: Secondary | ICD-10-CM | POA: Diagnosis not present

## 2013-10-20 DIAGNOSIS — M542 Cervicalgia: Secondary | ICD-10-CM | POA: Insufficient documentation

## 2013-10-21 ENCOUNTER — Encounter: Payer: Self-pay | Admitting: Internal Medicine

## 2013-10-22 ENCOUNTER — Ambulatory Visit: Payer: Medicare Other | Admitting: Physical Therapy

## 2013-10-22 ENCOUNTER — Encounter: Payer: Self-pay | Admitting: Family Medicine

## 2013-10-22 DIAGNOSIS — M5412 Radiculopathy, cervical region: Secondary | ICD-10-CM

## 2013-10-22 DIAGNOSIS — M5012 Cervical disc disorder with radiculopathy, mid-cervical region: Secondary | ICD-10-CM | POA: Diagnosis not present

## 2013-10-22 MED ORDER — TRAMADOL HCL 50 MG PO TABS: ORAL_TABLET | ORAL | Status: DC

## 2013-10-22 NOTE — Telephone Encounter (Signed)
Refill done.  

## 2013-10-26 ENCOUNTER — Ambulatory Visit: Payer: Medicare Other | Admitting: Physical Therapy

## 2013-10-26 DIAGNOSIS — M5012 Cervical disc disorder with radiculopathy, mid-cervical region: Secondary | ICD-10-CM | POA: Diagnosis not present

## 2013-10-29 ENCOUNTER — Ambulatory Visit: Payer: Medicare Other | Admitting: Physical Therapy

## 2013-10-29 DIAGNOSIS — M5012 Cervical disc disorder with radiculopathy, mid-cervical region: Secondary | ICD-10-CM | POA: Diagnosis not present

## 2013-10-30 ENCOUNTER — Encounter: Payer: Self-pay | Admitting: Family Medicine

## 2013-10-30 ENCOUNTER — Ambulatory Visit (INDEPENDENT_AMBULATORY_CARE_PROVIDER_SITE_OTHER): Payer: Medicare Other

## 2013-10-30 DIAGNOSIS — Z23 Encounter for immunization: Secondary | ICD-10-CM | POA: Diagnosis not present

## 2013-10-31 DIAGNOSIS — H2513 Age-related nuclear cataract, bilateral: Secondary | ICD-10-CM | POA: Diagnosis not present

## 2013-11-02 ENCOUNTER — Ambulatory Visit: Payer: Medicare Other | Attending: Internal Medicine | Admitting: Physical Therapy

## 2013-11-02 DIAGNOSIS — M7552 Bursitis of left shoulder: Secondary | ICD-10-CM | POA: Diagnosis not present

## 2013-11-02 DIAGNOSIS — M5012 Cervical disc disorder with radiculopathy, mid-cervical region: Secondary | ICD-10-CM | POA: Diagnosis not present

## 2013-11-02 DIAGNOSIS — M25512 Pain in left shoulder: Secondary | ICD-10-CM | POA: Diagnosis not present

## 2013-11-02 DIAGNOSIS — M542 Cervicalgia: Secondary | ICD-10-CM | POA: Diagnosis not present

## 2013-11-05 ENCOUNTER — Ambulatory Visit: Payer: Medicare Other | Admitting: Physical Therapy

## 2013-11-05 DIAGNOSIS — M5012 Cervical disc disorder with radiculopathy, mid-cervical region: Secondary | ICD-10-CM | POA: Diagnosis not present

## 2013-11-06 ENCOUNTER — Encounter: Payer: Self-pay | Admitting: Family Medicine

## 2013-11-09 ENCOUNTER — Ambulatory Visit: Payer: Medicare Other | Admitting: Physical Therapy

## 2013-11-09 DIAGNOSIS — M5012 Cervical disc disorder with radiculopathy, mid-cervical region: Secondary | ICD-10-CM | POA: Diagnosis not present

## 2013-11-12 ENCOUNTER — Ambulatory Visit: Payer: Medicare Other | Admitting: Physical Therapy

## 2013-11-12 DIAGNOSIS — M5012 Cervical disc disorder with radiculopathy, mid-cervical region: Secondary | ICD-10-CM | POA: Diagnosis not present

## 2013-11-16 ENCOUNTER — Ambulatory Visit: Payer: Medicare Other | Admitting: Physical Therapy

## 2013-11-16 DIAGNOSIS — M5012 Cervical disc disorder with radiculopathy, mid-cervical region: Secondary | ICD-10-CM | POA: Diagnosis not present

## 2013-11-19 ENCOUNTER — Ambulatory Visit: Payer: Medicare Other | Admitting: Physical Therapy

## 2013-11-19 DIAGNOSIS — M5012 Cervical disc disorder with radiculopathy, mid-cervical region: Secondary | ICD-10-CM | POA: Diagnosis not present

## 2013-11-20 ENCOUNTER — Ambulatory Visit (INDEPENDENT_AMBULATORY_CARE_PROVIDER_SITE_OTHER): Payer: Medicare Other | Admitting: Family Medicine

## 2013-11-20 ENCOUNTER — Encounter: Payer: Self-pay | Admitting: Family Medicine

## 2013-11-20 VITALS — BP 132/70 | HR 102 | Wt 221.0 lb

## 2013-11-20 DIAGNOSIS — M47812 Spondylosis without myelopathy or radiculopathy, cervical region: Secondary | ICD-10-CM

## 2013-11-20 DIAGNOSIS — M4722 Other spondylosis with radiculopathy, cervical region: Secondary | ICD-10-CM

## 2013-11-20 NOTE — Assessment & Plan Note (Signed)
Still feel that patient's narrowing on the left side in the cervical spine is likely giving and contributing to patient's pain. Encourage patient to do home traction and states that he yard he has a unit any will take this to formal physical therapy to learn how to use it properly. Patient will finish with the last 4 visits of physical therapy over the course of the next 2 weeks. We discussed continuing icing regimen as well as well as the home exercise program. Patient can titrate off the gabapentin at this time if he would like. Patient will follow-up with me on an as-needed basis for any exacerbations or problems.  Spent greater than 25 minutes with patient face-to-face and had greater than 50% of counseling including as described above in assessment and plan.

## 2013-11-20 NOTE — Progress Notes (Signed)
  [facility] [address], [place] [address] Phone: [phone] Subjective:     CC: Left arm pain follow up  [id number] Todd Larson is a 73 y.o. male coming in with complaint of left upper arm pain. Patient continued to have upper arm pain. Patient has been seen for this previously. Patient did get somewhat better with formal physical therapy. Patient states though he was having a much worse time and was noticing some with the upper extremity. Patient was sent for an MRI and is here to discuss results the next options. Patient's MRI shows some mild to moderate central canal narrowing most notably at C5-C6 and does have moderate severe foraminal narrowing on the left of multiple different levels. Patient states that the pain continues to be fairly intermittent but is improving slowly. Patient center with formal physical therapy. Patient is no longer taking any pain medications. Patient is feeling much better at this time. Patient would like to stop the gabapentin. Patient does not use tramadol and multiple weeks. Patient feels like he has improved. Patient though is concerned because he does not know what the trigger is to cause this difficulty. Patient states it seems when he does repetitive lifting with the left shoulder seems to be exacerbating the situation. Denies any nighttime awakenings or any new symptoms.    Past medical history, social, surgical and family history all reviewed in electronic medical record.   Review of Systems: No headache, visual changes, nausea, vomiting, diarrhea, constipation, dizziness, abdominal pain, skin rash, fevers, chills, night sweats, weight loss, swollen lymph nodes, body aches, joint swelling, muscle aches, chest pain, shortness of breath, mood changes.   Objective Blood pressure 132/70, pulse 102, weight 221 lb (100.245 kg), SpO2 97 %.  General: No apparent distress alert and oriented x3 mood and affect normal,  dressed appropriately.  HEENT: Pupils equal, extraocular movements intact  Respiratory: Patient's speak in full sentences and does not appear short of breath  Cardiovascular: No lower extremity edema, non tender, no erythema  Skin: Warm dry intact with no signs of infection or rash on extremities or on axial skeleton.  Abdomen: Soft nontender  Neuro: Cranial nerves II through XII are intact, neurovascularly intact in all extremities with 2+ DTRs and 2+ pulses.  Lymph: No lymphadenopathy of posterior or anterior cervical chain or axillae bilaterally.  Gait normal with good balance and coordination.  MSK:  Non tender with full range of motion and good stability and symmetric strength and tone of shoulders, elbows, wrist, hip, knee and ankles bilaterally.  Neck: Inspection shows mild increased his lordosis of the cervical region and increased kyphosis of the thoracic region. No palpable stepoffs. Negative Spurling's maneuver. Range of motion the patient's neck does have decreased extension to approximately 5 of full flexion with mild limitation of rotation bilaterally lacking the last 7 bilaterally. Good side bending, this is an improvement from previous exam Grip strength and sensation normal in bilateral hands Strength good C4 to T1 distribution, slight 4/5 strength of the C7 nerve root compared to the contralateral side no significant change from previous exam No sensory change to C4 to T1 Negative Hoffman sign bilaterally Reflexes normal No significant change from previous exam.      Impression and Recommendations:     This case required medical decision making of moderate complexity.

## 2013-11-20 NOTE — Patient Instructions (Signed)
Verbal instructions given

## 2013-11-23 ENCOUNTER — Ambulatory Visit: Payer: Medicare Other | Admitting: Physical Therapy

## 2013-11-23 DIAGNOSIS — M5012 Cervical disc disorder with radiculopathy, mid-cervical region: Secondary | ICD-10-CM | POA: Diagnosis not present

## 2013-11-30 ENCOUNTER — Ambulatory Visit: Payer: Medicare Other | Admitting: Physical Therapy

## 2013-11-30 DIAGNOSIS — M5012 Cervical disc disorder with radiculopathy, mid-cervical region: Secondary | ICD-10-CM | POA: Diagnosis not present

## 2013-12-03 ENCOUNTER — Ambulatory Visit: Payer: Medicare Other | Attending: Internal Medicine | Admitting: Physical Therapy

## 2013-12-03 DIAGNOSIS — M7552 Bursitis of left shoulder: Secondary | ICD-10-CM | POA: Diagnosis not present

## 2013-12-03 DIAGNOSIS — M542 Cervicalgia: Secondary | ICD-10-CM | POA: Insufficient documentation

## 2013-12-03 DIAGNOSIS — M25512 Pain in left shoulder: Secondary | ICD-10-CM | POA: Insufficient documentation

## 2013-12-03 DIAGNOSIS — M5012 Cervical disc disorder with radiculopathy, mid-cervical region: Secondary | ICD-10-CM | POA: Diagnosis not present

## 2013-12-07 ENCOUNTER — Ambulatory Visit: Payer: Medicare Other | Admitting: Physical Therapy

## 2013-12-07 DIAGNOSIS — M5012 Cervical disc disorder with radiculopathy, mid-cervical region: Secondary | ICD-10-CM | POA: Diagnosis not present

## 2013-12-10 ENCOUNTER — Ambulatory Visit: Payer: Medicare Other | Admitting: Physical Therapy

## 2013-12-10 DIAGNOSIS — M5012 Cervical disc disorder with radiculopathy, mid-cervical region: Secondary | ICD-10-CM | POA: Diagnosis not present

## 2013-12-14 ENCOUNTER — Ambulatory Visit: Payer: Medicare Other | Admitting: Physical Therapy

## 2014-02-15 ENCOUNTER — Ambulatory Visit: Payer: Self-pay | Admitting: Internal Medicine

## 2014-02-15 ENCOUNTER — Ambulatory Visit (INDEPENDENT_AMBULATORY_CARE_PROVIDER_SITE_OTHER): Payer: Medicare Other | Admitting: Internal Medicine

## 2014-02-15 ENCOUNTER — Encounter: Payer: Self-pay | Admitting: Internal Medicine

## 2014-02-15 VITALS — BP 150/90 | HR 103 | Temp 98.7°F | Ht 68.0 in | Wt 224.0 lb

## 2014-02-15 DIAGNOSIS — B369 Superficial mycosis, unspecified: Secondary | ICD-10-CM

## 2014-02-15 DIAGNOSIS — I10 Essential (primary) hypertension: Secondary | ICD-10-CM | POA: Diagnosis not present

## 2014-02-15 MED ORDER — KETOCONAZOLE 2 % EX CREA: 1.0000 "application " | TOPICAL_CREAM | Freq: Every day | CUTANEOUS | Status: DC

## 2014-02-15 NOTE — Patient Instructions (Addendum)
After showering  use a hairdryer to blow the nails dry. Also do this after any activities which cause sweating. Change socks repeatedly through the day if the feet do sweat. Tennis shoes or other foot wear which appear somewhat moldy should be discarded.  Minimal Blood Pressure Goal= AVERAGE < 140/90;  Ideal is an AVERAGE < 135/85. This AVERAGE should be calculated from @ least 5-7 BP readings taken @ different times of day on different days of week. You should not respond to isolated BP readings , but rather the AVERAGE for that week .Please bring your  blood pressure cuff to office visits to verify that it is reliable.It  can also be checked against the blood pressure device at the pharmacy. Finger or wrist cuffs are not dependable; an arm cuff is.

## 2014-02-15 NOTE — Progress Notes (Signed)
   Subjective:    Patient ID: Todd Larson, male    DOB: 1940-03-03, 74 y.o.   MRN: [id number]  HPI   He noted an irritated area over the left lateral ankle area 02/14/14 as he was putting on his sock. He described this as "bumps". He applied Aveeno daily moisturizing lotion with improvement in the appearance of the lesions  Yesterday he was up at a pet fair and felt chilled. He woke up at 3 AM this morning shivering.  He denies any extrinsic symptoms.  He is not monitoring his blood pressure at home.  Review of Systems  No associated itchy, watery eyes.  Swelling of the lips or tongue denied.  Shortness of breath, wheezing, or cough absent.  No urticaria noted.   Purulence absent.  Diarrhea not present.    Objective:   Physical Exam  Pertinent or positive findings include: Gynecomastia suggested. Repeat blood pressure was 140/88 Abdomen is protuberant.Umbilical hernia present. He has 2 large dark keratotic lesions over the upper posterior back. Fungal nail changes are present, mainly on the left. There is evidence of previous trauma to the left great toenail He has an erythematous irregular rash over the left ankle and lateral aspect of the foot which appears fungal in nature.  General appearance :adequately nourished; in no distress. Eyes: No conjunctival inflammation or scleral icterus is present. Oral exam: Dental hygiene is good. Lips and gums are healthy appearing.There is no oropharyngeal erythema or exudate noted.  Heart:  Normal rate and regular rhythm. S1 and S2 normal without gallop, murmur, click, rub or other extra sounds   Lungs:Chest clear to auscultation; no wheezes, rhonchi,rales ,or rubs present.No increased work of breathing.  Vascular : all pulses equal ; no bruits present. Skin:Warm & dry ; no jaundice or tenting Lymphatic: No lymphadenopathy is noted about the head, neck, axilla Neuro: Strength, tone  normal.       Assessment & Plan:  #1 fungal  dermatitis   #2 hypertension, adequate control  Plan: See orders and recommendations

## 2014-02-15 NOTE — Progress Notes (Signed)
Pre visit review using our clinic review tool, if applicable. No additional management support is needed unless otherwise documented below in the visit note. 

## 2014-02-16 DIAGNOSIS — L570 Actinic keratosis: Secondary | ICD-10-CM | POA: Diagnosis not present

## 2014-02-16 DIAGNOSIS — D225 Melanocytic nevi of trunk: Secondary | ICD-10-CM | POA: Diagnosis not present

## 2014-02-16 DIAGNOSIS — L821 Other seborrheic keratosis: Secondary | ICD-10-CM | POA: Diagnosis not present

## 2014-02-16 DIAGNOSIS — X32XXXD Exposure to sunlight, subsequent encounter: Secondary | ICD-10-CM | POA: Diagnosis not present

## 2014-02-17 ENCOUNTER — Encounter: Payer: Self-pay | Admitting: Internal Medicine

## 2014-02-23 ENCOUNTER — Ambulatory Visit (INDEPENDENT_AMBULATORY_CARE_PROVIDER_SITE_OTHER): Payer: Medicare Other | Admitting: Internal Medicine

## 2014-02-23 ENCOUNTER — Encounter: Payer: Self-pay | Admitting: Internal Medicine

## 2014-02-23 VITALS — BP 134/80 | HR 99 | Temp 98.6°F | Ht 68.0 in | Wt 223.8 lb

## 2014-02-23 DIAGNOSIS — J069 Acute upper respiratory infection, unspecified: Secondary | ICD-10-CM | POA: Diagnosis not present

## 2014-02-23 MED ORDER — AMOXICILLIN 500 MG PO CAPS: 500.0000 mg | ORAL_CAPSULE | Freq: Three times a day (TID) | ORAL | Status: DC

## 2014-02-23 NOTE — Progress Notes (Signed)
Pre visit review using our clinic review tool, if applicable. No additional management support is needed unless otherwise documented below in the visit note. 

## 2014-02-23 NOTE — Patient Instructions (Signed)
Continue  Nasal Hygiene Protocol: Plain Mucinex (NOT D) for thick secretions ;force NON dairy fluids .   Nasal cleansing in the shower as discussed with lather of mild shampoo.After 10 seconds wash off lather while  exhaling through nostrils. Make sure that all residual soap is removed to prevent irritation.  Flonase OR Nasacort AQ 1 spray in each nostril twice a day as needed. Use the "crossover" technique into opposite nostril spraying toward opposite ear @ 45 degree angle, not straight up into nostril.  Plain Allegra (NOT D )  160 daily , Loratidine 10 mg , OR Zyrtec 10 mg @ bedtime  as needed for itchy eyes & sneezing.

## 2014-02-23 NOTE — Progress Notes (Signed)
   Subjective:    Patient ID: Todd Larson, male    DOB: [birth year], 74 y.o.   MRN: [id number]  HPI Symptoms began 02/16/14 as rhinitis with copious clear secretions. This associated with a sore throat.  As of 02/20/14 he describes yellow nasal discharge in the morning. He also has a cough which is productive of clear-gray/green sputum. He has more secretions coming from the head than the chest. He's noted some wheezing at night.  He's been using nasal hygiene protocol as well as Mucinex.  Review of Systems He denies frontal headache, maxillary sinus pain, dental pain, otic pain, otic discharge, or sore throat at this time.    Objective:   Physical Exam  Pertinent or positive findings include: He has slight hyponasal speech pattern. There is minimal erythema of the nares, greater on the right than the left. He has an intermittent dry cough.  General appearance:Adequately nourished; no acute distress or increased work of breathing is present.  No  lymphadenopathy about the head, neck, or axilla noted.  Eyes: No conjunctival inflammation or lid edema is present. There is no scleral icterus. Ears:  External ear exam shows no significant lesions or deformities.  Otoscopic examination reveals clear canals, tympanic membranes are intact bilaterally without bulging, retraction, inflammation or discharge. Nose:  External nasal examination shows no deformity or inflammation.  No septal dislocation or deviation.No obstruction to airflow.  Oral exam: Dental hygiene is good; lips and gums are healthy appearing.There is no oropharyngeal erythema or exudate noted.  Neck:  No deformities, thyromegaly, masses, or tenderness noted.   Supple with full range of motion without pain.  Heart:  Normal rate and regular rhythm. S1 and S2 normal without gallop, murmur, click, rub or other extra sounds.  Lungs:Chest clear to auscultation; no wheezes, rhonchi,rales ,or rubs present. Extremities:  No cyanosis, edema,  or clubbing  noted  Skin: Warm & dry w/o jaundice or tenting.      Assessment & Plan:  #1 upper respiratory tract infection  Plan: See orders and recommendations

## 2014-02-24 DIAGNOSIS — S233XXA Sprain of ligaments of thoracic spine, initial encounter: Secondary | ICD-10-CM | POA: Diagnosis not present

## 2014-02-24 DIAGNOSIS — M9903 Segmental and somatic dysfunction of lumbar region: Secondary | ICD-10-CM | POA: Diagnosis not present

## 2014-02-24 DIAGNOSIS — S335XXA Sprain of ligaments of lumbar spine, initial encounter: Secondary | ICD-10-CM | POA: Diagnosis not present

## 2014-02-24 DIAGNOSIS — M9902 Segmental and somatic dysfunction of thoracic region: Secondary | ICD-10-CM | POA: Diagnosis not present

## 2014-02-24 DIAGNOSIS — S134XXA Sprain of ligaments of cervical spine, initial encounter: Secondary | ICD-10-CM | POA: Diagnosis not present

## 2014-02-24 DIAGNOSIS — M9901 Segmental and somatic dysfunction of cervical region: Secondary | ICD-10-CM | POA: Diagnosis not present

## 2014-03-09 ENCOUNTER — Encounter: Payer: Self-pay | Admitting: Internal Medicine

## 2014-03-12 DIAGNOSIS — B353 Tinea pedis: Secondary | ICD-10-CM | POA: Diagnosis not present

## 2014-03-12 DIAGNOSIS — B351 Tinea unguium: Secondary | ICD-10-CM | POA: Diagnosis not present

## 2014-03-13 DIAGNOSIS — S0502XA Injury of conjunctiva and corneal abrasion without foreign body, left eye, initial encounter: Secondary | ICD-10-CM | POA: Diagnosis not present

## 2014-03-16 DIAGNOSIS — S0502XA Injury of conjunctiva and corneal abrasion without foreign body, left eye, initial encounter: Secondary | ICD-10-CM | POA: Diagnosis not present

## 2014-03-18 DIAGNOSIS — R358 Other polyuria: Secondary | ICD-10-CM | POA: Diagnosis not present

## 2014-03-18 DIAGNOSIS — R972 Elevated prostate specific antigen [PSA]: Secondary | ICD-10-CM | POA: Diagnosis not present

## 2014-03-29 ENCOUNTER — Encounter: Payer: Self-pay | Admitting: Internal Medicine

## 2014-03-29 ENCOUNTER — Ambulatory Visit (INDEPENDENT_AMBULATORY_CARE_PROVIDER_SITE_OTHER): Payer: Medicare Other | Admitting: Internal Medicine

## 2014-03-29 VITALS — BP 100/76 | HR 93 | Temp 98.4°F | Resp 15 | Ht 68.0 in | Wt 222.2 lb

## 2014-03-29 DIAGNOSIS — S335XXA Sprain of ligaments of lumbar spine, initial encounter: Secondary | ICD-10-CM | POA: Diagnosis not present

## 2014-03-29 DIAGNOSIS — M9903 Segmental and somatic dysfunction of lumbar region: Secondary | ICD-10-CM | POA: Diagnosis not present

## 2014-03-29 DIAGNOSIS — M546 Pain in thoracic spine: Secondary | ICD-10-CM | POA: Diagnosis not present

## 2014-03-29 DIAGNOSIS — S233XXA Sprain of ligaments of thoracic spine, initial encounter: Secondary | ICD-10-CM | POA: Diagnosis not present

## 2014-03-29 DIAGNOSIS — M659 Synovitis and tenosynovitis, unspecified: Secondary | ICD-10-CM | POA: Insufficient documentation

## 2014-03-29 DIAGNOSIS — M6588 Other synovitis and tenosynovitis, other site: Secondary | ICD-10-CM | POA: Diagnosis not present

## 2014-03-29 DIAGNOSIS — M9901 Segmental and somatic dysfunction of cervical region: Secondary | ICD-10-CM | POA: Diagnosis not present

## 2014-03-29 DIAGNOSIS — M9902 Segmental and somatic dysfunction of thoracic region: Secondary | ICD-10-CM | POA: Diagnosis not present

## 2014-03-29 DIAGNOSIS — S134XXA Sprain of ligaments of cervical spine, initial encounter: Secondary | ICD-10-CM | POA: Diagnosis not present

## 2014-03-29 NOTE — Patient Instructions (Signed)
The best exercises for the low back include freestyle swimming, stretch aerobics, and yoga.  Use an anti-inflammatory cream such as Aspercreme or Zostrix cream twice a day to the affected area as needed. In lieu of this warm moist compresses or  hot water bottle can be used. Do not apply ice .

## 2014-03-29 NOTE — Progress Notes (Signed)
   Subjective:    Patient ID: Todd Larson, male    DOB: 20-Sep-1940, 74 y.o.   MRN: [id number]  HPI Symptoms began 3-4 weeks ago as aching in the mid back after repeated waist flexion & squatting taking care of stray cats. The pain was worse in the act of lying flat but better when supine.  Films 09/23/13 had revealed dextroscoliosis and degenerative joint changes, mainly right-sided.  Chiropractory has been of benefit.  He also has some pain in the dorsum of the right thumb which he relates to repetitive direct pressure on the thumb to support himself as he was squatting.   Review of Systems He denies numbness, tingling, weakness in the upper or lower extremities.  He has no incontinence of urine or stool.  There is no associated change in skin  color or temperature in area of his symptoms.    Objective:   Physical Exam Pertinent or positive findings include: There is tenderness to palpation over the dorsum of the right thumb.  He has an umbilical hernia.  Crepitus is noted in both knees. Gait including heel and toe walking was normal.  He is able to lie flat and sit up without help.  Straight leg raising is negative to almost 90 bilaterally.  General appearance :adequately nourished; in no distress. Eyes: No conjunctival inflammation or scleral icterus is present. Heart:  Normal rate and regular rhythm. S1 and S2 normal without gallop, murmur, click, rub or other extra sounds   Lungs:Chest clear to auscultation; no wheezes, rhonchi,rales ,or rubs present.No increased work of breathing.  Abdomen: bowel sounds normal, soft and non-tender without masses, or organomegaly  noted.  No guarding or rebound. No flank or thoracic spine tenderness to percussion. Vascular : all pulses equal ; no bruits present. Skin:Warm & dry.  Intact without suspicious lesions or rashes ; no tenting or jaundice  Lymphatic: No lymphadenopathy is noted about the head, neck, axilla Neuro: Strength, tone &  DTRs normal.         Assessment & Plan:  See Current Assessment & Plan in Problem List under specific Diagnosis younger

## 2014-03-29 NOTE — Assessment & Plan Note (Signed)
Use an anti-inflammatory cream such as Aspercreme or Zostrix cream twice a day to the affected area as needed. In lieu of this warm moist compresses or  hot water bottle can be used. Do not apply ice . 

## 2014-03-29 NOTE — Assessment & Plan Note (Signed)
Referral to  Dr Raynelle Chary  The best exercises for the mid & low back include freestyle swimming, stretch aerobics, and yoga.

## 2014-03-29 NOTE — Progress Notes (Signed)
Pre visit review using our clinic review tool, if applicable. No additional management support is needed unless otherwise documented below in the visit note. 

## 2014-03-30 ENCOUNTER — Encounter: Payer: Self-pay | Admitting: Internal Medicine

## 2014-03-31 DIAGNOSIS — M9903 Segmental and somatic dysfunction of lumbar region: Secondary | ICD-10-CM | POA: Diagnosis not present

## 2014-03-31 DIAGNOSIS — M9902 Segmental and somatic dysfunction of thoracic region: Secondary | ICD-10-CM | POA: Diagnosis not present

## 2014-03-31 DIAGNOSIS — S233XXA Sprain of ligaments of thoracic spine, initial encounter: Secondary | ICD-10-CM | POA: Diagnosis not present

## 2014-03-31 DIAGNOSIS — S134XXA Sprain of ligaments of cervical spine, initial encounter: Secondary | ICD-10-CM | POA: Diagnosis not present

## 2014-03-31 DIAGNOSIS — M9901 Segmental and somatic dysfunction of cervical region: Secondary | ICD-10-CM | POA: Diagnosis not present

## 2014-03-31 DIAGNOSIS — S335XXA Sprain of ligaments of lumbar spine, initial encounter: Secondary | ICD-10-CM | POA: Diagnosis not present

## 2014-04-06 DIAGNOSIS — M9902 Segmental and somatic dysfunction of thoracic region: Secondary | ICD-10-CM | POA: Diagnosis not present

## 2014-04-06 DIAGNOSIS — S335XXA Sprain of ligaments of lumbar spine, initial encounter: Secondary | ICD-10-CM | POA: Diagnosis not present

## 2014-04-06 DIAGNOSIS — S134XXA Sprain of ligaments of cervical spine, initial encounter: Secondary | ICD-10-CM | POA: Diagnosis not present

## 2014-04-06 DIAGNOSIS — M9901 Segmental and somatic dysfunction of cervical region: Secondary | ICD-10-CM | POA: Diagnosis not present

## 2014-04-06 DIAGNOSIS — S233XXA Sprain of ligaments of thoracic spine, initial encounter: Secondary | ICD-10-CM | POA: Diagnosis not present

## 2014-04-06 DIAGNOSIS — M9903 Segmental and somatic dysfunction of lumbar region: Secondary | ICD-10-CM | POA: Diagnosis not present

## 2014-04-08 DIAGNOSIS — S335XXA Sprain of ligaments of lumbar spine, initial encounter: Secondary | ICD-10-CM | POA: Diagnosis not present

## 2014-04-08 DIAGNOSIS — S134XXA Sprain of ligaments of cervical spine, initial encounter: Secondary | ICD-10-CM | POA: Diagnosis not present

## 2014-04-08 DIAGNOSIS — M9901 Segmental and somatic dysfunction of cervical region: Secondary | ICD-10-CM | POA: Diagnosis not present

## 2014-04-08 DIAGNOSIS — M9903 Segmental and somatic dysfunction of lumbar region: Secondary | ICD-10-CM | POA: Diagnosis not present

## 2014-04-08 DIAGNOSIS — S233XXA Sprain of ligaments of thoracic spine, initial encounter: Secondary | ICD-10-CM | POA: Diagnosis not present

## 2014-04-08 DIAGNOSIS — M9902 Segmental and somatic dysfunction of thoracic region: Secondary | ICD-10-CM | POA: Diagnosis not present

## 2014-04-13 DIAGNOSIS — S233XXA Sprain of ligaments of thoracic spine, initial encounter: Secondary | ICD-10-CM | POA: Diagnosis not present

## 2014-04-13 DIAGNOSIS — S134XXA Sprain of ligaments of cervical spine, initial encounter: Secondary | ICD-10-CM | POA: Diagnosis not present

## 2014-04-13 DIAGNOSIS — M9901 Segmental and somatic dysfunction of cervical region: Secondary | ICD-10-CM | POA: Diagnosis not present

## 2014-04-13 DIAGNOSIS — M9903 Segmental and somatic dysfunction of lumbar region: Secondary | ICD-10-CM | POA: Diagnosis not present

## 2014-04-13 DIAGNOSIS — M9902 Segmental and somatic dysfunction of thoracic region: Secondary | ICD-10-CM | POA: Diagnosis not present

## 2014-04-13 DIAGNOSIS — S335XXA Sprain of ligaments of lumbar spine, initial encounter: Secondary | ICD-10-CM | POA: Diagnosis not present

## 2014-04-15 DIAGNOSIS — S233XXA Sprain of ligaments of thoracic spine, initial encounter: Secondary | ICD-10-CM | POA: Diagnosis not present

## 2014-04-15 DIAGNOSIS — S335XXA Sprain of ligaments of lumbar spine, initial encounter: Secondary | ICD-10-CM | POA: Diagnosis not present

## 2014-04-15 DIAGNOSIS — M9903 Segmental and somatic dysfunction of lumbar region: Secondary | ICD-10-CM | POA: Diagnosis not present

## 2014-04-15 DIAGNOSIS — M9902 Segmental and somatic dysfunction of thoracic region: Secondary | ICD-10-CM | POA: Diagnosis not present

## 2014-04-15 DIAGNOSIS — M9901 Segmental and somatic dysfunction of cervical region: Secondary | ICD-10-CM | POA: Diagnosis not present

## 2014-04-15 DIAGNOSIS — S134XXA Sprain of ligaments of cervical spine, initial encounter: Secondary | ICD-10-CM | POA: Diagnosis not present

## 2014-04-20 DIAGNOSIS — S233XXA Sprain of ligaments of thoracic spine, initial encounter: Secondary | ICD-10-CM | POA: Diagnosis not present

## 2014-04-20 DIAGNOSIS — M9901 Segmental and somatic dysfunction of cervical region: Secondary | ICD-10-CM | POA: Diagnosis not present

## 2014-04-20 DIAGNOSIS — S134XXA Sprain of ligaments of cervical spine, initial encounter: Secondary | ICD-10-CM | POA: Diagnosis not present

## 2014-04-20 DIAGNOSIS — M9903 Segmental and somatic dysfunction of lumbar region: Secondary | ICD-10-CM | POA: Diagnosis not present

## 2014-04-20 DIAGNOSIS — M9902 Segmental and somatic dysfunction of thoracic region: Secondary | ICD-10-CM | POA: Diagnosis not present

## 2014-04-20 DIAGNOSIS — S335XXA Sprain of ligaments of lumbar spine, initial encounter: Secondary | ICD-10-CM | POA: Diagnosis not present

## 2014-04-22 DIAGNOSIS — S233XXA Sprain of ligaments of thoracic spine, initial encounter: Secondary | ICD-10-CM | POA: Diagnosis not present

## 2014-04-22 DIAGNOSIS — M9902 Segmental and somatic dysfunction of thoracic region: Secondary | ICD-10-CM | POA: Diagnosis not present

## 2014-04-22 DIAGNOSIS — S335XXA Sprain of ligaments of lumbar spine, initial encounter: Secondary | ICD-10-CM | POA: Diagnosis not present

## 2014-04-22 DIAGNOSIS — S134XXA Sprain of ligaments of cervical spine, initial encounter: Secondary | ICD-10-CM | POA: Diagnosis not present

## 2014-04-22 DIAGNOSIS — M9901 Segmental and somatic dysfunction of cervical region: Secondary | ICD-10-CM | POA: Diagnosis not present

## 2014-04-22 DIAGNOSIS — M9903 Segmental and somatic dysfunction of lumbar region: Secondary | ICD-10-CM | POA: Diagnosis not present

## 2014-04-28 DIAGNOSIS — M9901 Segmental and somatic dysfunction of cervical region: Secondary | ICD-10-CM | POA: Diagnosis not present

## 2014-04-28 DIAGNOSIS — S335XXA Sprain of ligaments of lumbar spine, initial encounter: Secondary | ICD-10-CM | POA: Diagnosis not present

## 2014-04-28 DIAGNOSIS — M9902 Segmental and somatic dysfunction of thoracic region: Secondary | ICD-10-CM | POA: Diagnosis not present

## 2014-04-28 DIAGNOSIS — S134XXA Sprain of ligaments of cervical spine, initial encounter: Secondary | ICD-10-CM | POA: Diagnosis not present

## 2014-04-28 DIAGNOSIS — M9903 Segmental and somatic dysfunction of lumbar region: Secondary | ICD-10-CM | POA: Diagnosis not present

## 2014-04-28 DIAGNOSIS — S233XXA Sprain of ligaments of thoracic spine, initial encounter: Secondary | ICD-10-CM | POA: Diagnosis not present

## 2014-05-03 ENCOUNTER — Ambulatory Visit (INDEPENDENT_AMBULATORY_CARE_PROVIDER_SITE_OTHER): Payer: Medicare Other | Admitting: Internal Medicine

## 2014-05-03 ENCOUNTER — Encounter: Payer: Self-pay | Admitting: Internal Medicine

## 2014-05-03 VITALS — BP 162/98 | HR 92 | Temp 98.5°F | Ht 68.0 in | Wt 224.5 lb

## 2014-05-03 DIAGNOSIS — IMO0002 Reserved for concepts with insufficient information to code with codable children: Secondary | ICD-10-CM

## 2014-05-03 DIAGNOSIS — N508 Other specified disorders of male genital organs: Secondary | ICD-10-CM | POA: Diagnosis not present

## 2014-05-03 DIAGNOSIS — K6289 Other specified diseases of anus and rectum: Secondary | ICD-10-CM | POA: Diagnosis not present

## 2014-05-03 NOTE — Progress Notes (Signed)
   Subjective:    Patient ID: Todd Larson, male    DOB: 1940/12/14, 74 y.o.   MRN: [id number]  HPI He describes a constellation of symptoms which includes discomfort in the left scrotum which he attributes to a retracted testicle. This seems to occur when he has irritation around the rectal area due to pasty, soft stools. He believes stool changes might be related to broccoli ingestion. He does eat an increased amount of vegetables. The change in bowels requires him to strain. He does have increased fluids up to 70-80 ounces a day.  Dr. Burnell Blanks, Urologist perform an ultrasound which showed no hernia. He is on finasteride;is PSA has dropped from 10 down to 1.5. His only GU symptom is nocturia 2  Review of Systems Unexplained weight loss, abdominal pain, significant dyspepsia, dysphagia, melena, rectal bleeding, or persistently small caliber stools are denied. Dysuria, pyuria, hematuria, frequency,  or polyuria are denied.    Objective:   Physical Exam  Pertinent or positive findings include:  Bilateral ptosis is present. S4 is present.  He has a large umbilical hernia.  The left testicle is palpable; it is indeed high in the left scrotum.  General appearance :adequately nourished; in no distress. Eyes: No conjunctival inflammation or scleral icterus is present. Oral exam:  Lips and gums are healthy appearing.There is no oropharyngeal erythema or exudate noted. Dental hygiene is good. Heart:  Normal rate and regular rhythm. S1 and S2 normal without gallop, murmur, click,or rub .   Lungs:Chest clear to auscultation; no wheezes, rhonchi,rales ,or rubs present.No increased work of breathing.  Abdomen: bowel sounds normal, soft and non-tender without masses, organomegaly or hernias noted.  No guarding or rebound.  Vascular : all pulses equal ; no bruits present. Skin:Warm & dry.  Intact without suspicious lesions or rashes ; no tenting or jaundice  Lymphatic: No lymphadenopathy is noted  about the head, neck, axilla, or inguinal areas.  Neuro: Strength, tone  normal.        Assessment & Plan:  #1 left testicular pain  #2 Proctitis, most likely related to broccoli ingestion. This testicular pain may be referred pain from the proctitis. A trial of gabapentin will be initiated.  Plan: Once symptoms have resolved; he will ingest broccoli only as a juice.  Second urologic opinion offered but declined at this time.

## 2014-05-03 NOTE — Progress Notes (Signed)
Pre visit review using our clinic review tool, if applicable. No additional management support is needed unless otherwise documented below in the visit note. 

## 2014-05-03 NOTE — Patient Instructions (Signed)
Assess response to the gabapentin one every 8 hours as needed for the testicular pain. If it is partially beneficial, it can be increased up to a total of 3 pills every 8 hours as needed. This increase of 1 pill each dose  should take place over 72 hours at least.If 300 mg is effective dose ; there is a 300 mg pill.  Once the proctitis symptoms have resolved; ingest broccoli only in the  form of juice

## 2014-05-05 DIAGNOSIS — S335XXA Sprain of ligaments of lumbar spine, initial encounter: Secondary | ICD-10-CM | POA: Diagnosis not present

## 2014-05-05 DIAGNOSIS — M9902 Segmental and somatic dysfunction of thoracic region: Secondary | ICD-10-CM | POA: Diagnosis not present

## 2014-05-05 DIAGNOSIS — S233XXA Sprain of ligaments of thoracic spine, initial encounter: Secondary | ICD-10-CM | POA: Diagnosis not present

## 2014-05-05 DIAGNOSIS — M9901 Segmental and somatic dysfunction of cervical region: Secondary | ICD-10-CM | POA: Diagnosis not present

## 2014-05-05 DIAGNOSIS — M9903 Segmental and somatic dysfunction of lumbar region: Secondary | ICD-10-CM | POA: Diagnosis not present

## 2014-05-05 DIAGNOSIS — S134XXA Sprain of ligaments of cervical spine, initial encounter: Secondary | ICD-10-CM | POA: Diagnosis not present

## 2014-05-12 DIAGNOSIS — M9902 Segmental and somatic dysfunction of thoracic region: Secondary | ICD-10-CM | POA: Diagnosis not present

## 2014-05-12 DIAGNOSIS — M9903 Segmental and somatic dysfunction of lumbar region: Secondary | ICD-10-CM | POA: Diagnosis not present

## 2014-05-12 DIAGNOSIS — M9901 Segmental and somatic dysfunction of cervical region: Secondary | ICD-10-CM | POA: Diagnosis not present

## 2014-05-12 DIAGNOSIS — S233XXA Sprain of ligaments of thoracic spine, initial encounter: Secondary | ICD-10-CM | POA: Diagnosis not present

## 2014-05-12 DIAGNOSIS — S335XXA Sprain of ligaments of lumbar spine, initial encounter: Secondary | ICD-10-CM | POA: Diagnosis not present

## 2014-05-12 DIAGNOSIS — S134XXA Sprain of ligaments of cervical spine, initial encounter: Secondary | ICD-10-CM | POA: Diagnosis not present

## 2014-05-19 DIAGNOSIS — S233XXA Sprain of ligaments of thoracic spine, initial encounter: Secondary | ICD-10-CM | POA: Diagnosis not present

## 2014-05-19 DIAGNOSIS — S134XXA Sprain of ligaments of cervical spine, initial encounter: Secondary | ICD-10-CM | POA: Diagnosis not present

## 2014-05-19 DIAGNOSIS — S335XXA Sprain of ligaments of lumbar spine, initial encounter: Secondary | ICD-10-CM | POA: Diagnosis not present

## 2014-05-19 DIAGNOSIS — M9901 Segmental and somatic dysfunction of cervical region: Secondary | ICD-10-CM | POA: Diagnosis not present

## 2014-05-19 DIAGNOSIS — M9903 Segmental and somatic dysfunction of lumbar region: Secondary | ICD-10-CM | POA: Diagnosis not present

## 2014-05-19 DIAGNOSIS — M9902 Segmental and somatic dysfunction of thoracic region: Secondary | ICD-10-CM | POA: Diagnosis not present

## 2014-06-09 DIAGNOSIS — M9902 Segmental and somatic dysfunction of thoracic region: Secondary | ICD-10-CM | POA: Diagnosis not present

## 2014-06-09 DIAGNOSIS — S335XXA Sprain of ligaments of lumbar spine, initial encounter: Secondary | ICD-10-CM | POA: Diagnosis not present

## 2014-06-09 DIAGNOSIS — M9901 Segmental and somatic dysfunction of cervical region: Secondary | ICD-10-CM | POA: Diagnosis not present

## 2014-06-09 DIAGNOSIS — S233XXA Sprain of ligaments of thoracic spine, initial encounter: Secondary | ICD-10-CM | POA: Diagnosis not present

## 2014-06-09 DIAGNOSIS — M9903 Segmental and somatic dysfunction of lumbar region: Secondary | ICD-10-CM | POA: Diagnosis not present

## 2014-06-09 DIAGNOSIS — S134XXA Sprain of ligaments of cervical spine, initial encounter: Secondary | ICD-10-CM | POA: Diagnosis not present

## 2014-06-30 DIAGNOSIS — S335XXA Sprain of ligaments of lumbar spine, initial encounter: Secondary | ICD-10-CM | POA: Diagnosis not present

## 2014-06-30 DIAGNOSIS — M9903 Segmental and somatic dysfunction of lumbar region: Secondary | ICD-10-CM | POA: Diagnosis not present

## 2014-06-30 DIAGNOSIS — M9901 Segmental and somatic dysfunction of cervical region: Secondary | ICD-10-CM | POA: Diagnosis not present

## 2014-06-30 DIAGNOSIS — S233XXA Sprain of ligaments of thoracic spine, initial encounter: Secondary | ICD-10-CM | POA: Diagnosis not present

## 2014-06-30 DIAGNOSIS — M9902 Segmental and somatic dysfunction of thoracic region: Secondary | ICD-10-CM | POA: Diagnosis not present

## 2014-06-30 DIAGNOSIS — S134XXA Sprain of ligaments of cervical spine, initial encounter: Secondary | ICD-10-CM | POA: Diagnosis not present

## 2014-08-09 ENCOUNTER — Ambulatory Visit (INDEPENDENT_AMBULATORY_CARE_PROVIDER_SITE_OTHER): Payer: Medicare Other | Admitting: Internal Medicine

## 2014-08-09 ENCOUNTER — Encounter: Payer: Self-pay | Admitting: Internal Medicine

## 2014-08-09 VITALS — BP 158/92 | HR 91 | Temp 98.1°F | Resp 18 | Wt 221.0 lb

## 2014-08-09 DIAGNOSIS — K4090 Unilateral inguinal hernia, without obstruction or gangrene, not specified as recurrent: Secondary | ICD-10-CM | POA: Diagnosis not present

## 2014-08-09 DIAGNOSIS — R195 Other fecal abnormalities: Secondary | ICD-10-CM | POA: Diagnosis not present

## 2014-08-09 NOTE — Progress Notes (Signed)
Pre visit review using our clinic review tool, if applicable. No additional management support is needed unless otherwise documented below in the visit note. 

## 2014-08-09 NOTE — Progress Notes (Signed)
   Subjective:    Patient ID: Todd Larson, male    DOB: [birth year], 74 y.o.   MRN: [id number]  HPI Since last night he's had some discomfort in the left inguinal area. There was no specific trigger. This is in the general area where the Urologist had diagnosed a retracted testicle. Surgery has been discussed as an option. He has no constitutional, significant GI or GU symptoms at this time  Additionally he has had some loose stools the last several days. He's not on a probiotic at this time.   Review of Systems Unexplained weight loss, abdominal pain, significant dyspepsia, dysphagia, melena, rectal bleeding, or persistently small caliber stools are denied.Dysuria, pyuria, hematuria, frequency, nocturia or polyuria are denied.     Objective:   Physical Exam  Pertinent or positive findings include: Bilateral ptosis is present, greater on the right. The oropharynx is crowded. Abdomen is protuberant. There is a small inguinal hernia in both inguinal canals. There is no evidence of incarcerated hernia.  General appearance :adequately nourished; in no distress.  Eyes: No conjunctival inflammation or scleral icterus is present.   Heart:  Normal rate and regular rhythm. S1 and S2 normal without gallop, murmur, click, rub or other extra sounds    Lungs:Chest clear to auscultation; no wheezes, rhonchi,rales ,or rubs present.No increased work of breathing.   Abdomen: bowel sounds normal, soft and non-tender without masses, or organomegaly noted.    Vascular : all pulses equal ; no bruits present.  Skin:Warm & dry.  Intact without suspicious lesions or rashes ; no tenting or jaundice   Lymphatic: No lymphadenopathy is noted about the head, neck, axilla, or inguinal areas.   Neuro: Strength, tone & DTRs normal.         Assessment & Plan:  #1 small, reducible inguinal hernia  #2 IBS with loose stool  Plan: See after visit summary

## 2014-08-09 NOTE — Patient Instructions (Signed)
Avoid heavy lifting because of  the hernia. Apply pressure to this area when coughing or sneezing. Please do not eat &  be seen immediately if there is persistent  severe pain in this area.  Please take a probiotic , Florastor OR Align, every day if the bowels are loose. This will replace the normal bacteria which  are necessary for formation of normal stool and processing of food.

## 2014-08-12 ENCOUNTER — Encounter: Payer: Self-pay | Admitting: Internal Medicine

## 2014-08-12 ENCOUNTER — Other Ambulatory Visit (INDEPENDENT_AMBULATORY_CARE_PROVIDER_SITE_OTHER): Payer: Medicare Other

## 2014-08-12 ENCOUNTER — Ambulatory Visit (INDEPENDENT_AMBULATORY_CARE_PROVIDER_SITE_OTHER): Payer: Medicare Other | Admitting: Internal Medicine

## 2014-08-12 VITALS — BP 118/60 | HR 85 | Temp 98.8°F | Resp 18 | Wt 219.0 lb

## 2014-08-12 DIAGNOSIS — I10 Essential (primary) hypertension: Secondary | ICD-10-CM

## 2014-08-12 DIAGNOSIS — R42 Dizziness and giddiness: Secondary | ICD-10-CM

## 2014-08-12 DIAGNOSIS — R000 Tachycardia, unspecified: Secondary | ICD-10-CM | POA: Diagnosis not present

## 2014-08-12 DIAGNOSIS — D582 Other hemoglobinopathies: Secondary | ICD-10-CM | POA: Diagnosis not present

## 2014-08-12 LAB — BASIC METABOLIC PANEL
BUN: 14 mg/dL (ref 6–23)
CALCIUM: 9.3 mg/dL (ref 8.4–10.5)
CO2: 28 mEq/L (ref 19–32)
CREATININE: 1.05 mg/dL (ref 0.40–1.50)
Chloride: 105 mEq/L (ref 96–112)
GFR: 73.78 mL/min (ref >60.00)
GLUCOSE: 111 mg/dL — AB (ref 70–99)
Potassium: 4.3 mEq/L (ref 3.5–5.1)
Sodium: 140 mEq/L (ref 135–145)

## 2014-08-12 LAB — HEPATIC FUNCTION PANEL
ALT: 21 U/L (ref 0–53)
AST: 18 U/L (ref 0–37)
Albumin: 4.1 g/dL (ref 3.5–5.2)
Alkaline Phosphatase: 52 U/L (ref 39–117)
BILIRUBIN DIRECT: 0.2 mg/dL (ref 0.0–0.3)
TOTAL PROTEIN: 6.8 g/dL (ref 6.0–8.3)
Total Bilirubin: 0.8 mg/dL (ref 0.2–1.2)

## 2014-08-12 LAB — CBC WITH DIFFERENTIAL/PLATELET
BASOS ABS: 0 10*3/uL (ref 0.0–0.1)
Basophils Relative: 0.3 % (ref 0.0–3.0)
EOS ABS: 0.1 10*3/uL (ref 0.0–0.7)
Eosinophils Relative: 1.4 % (ref 0.0–5.0)
HCT: 50.3 % (ref 39.0–52.0)
Hemoglobin: 17.4 g/dL — ABNORMAL HIGH (ref 13.0–17.0)
LYMPHS ABS: 2 10*3/uL (ref 0.7–4.0)
LYMPHS PCT: 19.4 % (ref 12.0–46.0)
MCHC: 34.5 g/dL (ref 30.0–36.0)
MCV: 88.7 fl (ref 78.0–100.0)
Monocytes Absolute: 0.6 10*3/uL (ref 0.1–1.0)
Monocytes Relative: 6.1 % (ref 3.0–12.0)
NEUTROS PCT: 72.8 % (ref 43.0–77.0)
Neutro Abs: 7.5 10*3/uL (ref 1.4–7.7)
Platelets: 281 10*3/uL (ref 150.0–400.0)
RBC: 5.67 Mil/uL (ref 4.22–5.81)
RDW: 12.6 % (ref 11.5–15.5)
WBC: 10.3 10*3/uL (ref 4.0–10.5)

## 2014-08-12 LAB — TSH: TSH: 1.63 u[IU]/mL (ref 0.35–4.50)

## 2014-08-12 LAB — T4, FREE: Free T4: 0.81 ng/dL (ref 0.60–1.60)

## 2014-08-12 MED ORDER — METOPROLOL TARTRATE 25 MG PO TABS: 25.0000 mg | ORAL_TABLET | Freq: Two times a day (BID) | ORAL | Status: DC

## 2014-08-12 NOTE — Progress Notes (Signed)
Pre visit review using our clinic review tool, if applicable. No additional management support is needed unless otherwise documented below in the visit note. 

## 2014-08-12 NOTE — Progress Notes (Signed)
   Subjective:    Patient ID: Todd Larson, male    DOB: 07-21-1940, 74 y.o.   MRN: [id number]  HPI   On 08/09/14 he noted head congestion and intermittent frontal pressure. He also noted pressure in his neck. He did not know if this was related to his chronic neck issues. He's been using a nasal hygiene program as well as Flonase.  He did have some clear drainage from his nose 8/8 & 8/9. He's had no extrinsic symptoms or symptoms of upper respiratory tract infection.  He states that he quit checking his blood pressure @ home because of marked variability. There could be 30 point difference in the blood pressures various times during the day.  He has no current electrolytes or thyroid function test on record.  Last labs were dated 05/17/10. TSH was 2.05. Chemistries were normal at that time as well as renal function. His hemoglobin was 18.    Review of Systems Frontal headache, facial pain , nasal purulence, dental pain, sore throat , otic pain or otic discharge denied. No fever , chills or sweats.  Extrinsic symptoms of itchy, watery eyes, sneezing, or angioedema are denied. There is no significant cough, sputum production, wheezing,or  paroxysmal nocturnal dyspnea.   Chest pain, palpitations, tachycardia, exertional dyspnea, paroxysmal nocturnal dyspnea, claudication or edema are absent.       Objective:   Physical Exam General appearance:Adequately nourished; no acute distress or increased work of breathing is present.    Lymphatic: No  lymphadenopathy about the head, neck, or axilla .  Eyes: No conjunctival inflammation or lid edema is present. There is no scleral icterus. Ptosis  Ears:  External ear exam shows no significant lesions or deformities.  Otoscopic examination reveals clear canals, tympanic membranes are intact bilaterally without bulging, retraction, inflammation or discharge.  Nose:  External nasal examination shows no deformity or inflammation. Nasal mucosa are  pink and moist without lesions or exudates No septal dislocation or deviation.No obstruction to airflow.   Oral exam: Dental hygiene is good; lips and gums are healthy appearing.There is no oropharyngeal erythema or exudate .  Neck:  No deformities, thyromegaly, masses, or tenderness noted.   Supple with full range of motion without pain.   Heart:  Third check of pulse was 85; regular rhythm. S1 and S2 normal without gallop, murmur, click, rub or other extra sounds.   Lungs:Chest clear to auscultation; no wheezes, rhonchi,rales ,or rubs present.  Abdomen: bowel sounds normal, soft and non-tender without masses, or organomegaly  noted.  No guarding or rebound.Ventral & umbilical hernias present.  Extremities:  No cyanosis or clubbing  noted .Trace edema   Skin: Warm & dry w/o tenting or jaundice. No significant lesions or rash.         Assessment & Plan:  #1 non allergic rhinitis #2 hypertension with marked variability in readings #3 tachycardia #4 elevated hemoglobinSee Orders & AVS

## 2014-08-12 NOTE — Patient Instructions (Signed)
Minimal Blood Pressure Goal= AVERAGE < 140/90;  Ideal is an AVERAGE < 135/85. This AVERAGE should be calculated from @ least 5-7 BP readings taken @ different times of day on different days of week. You should not respond to isolated BP readings , but rather the AVERAGE for that week .Please bring your  blood pressure cuff to office visits to verify that it is reliable.It  can also be checked against the blood pressure device at the pharmacy. Finger or wrist cuffs are not dependable; an arm cuff is.   Your next office appointment will be determined based upon review of your pending labs  and  xrays  Those written interpretation of the lab results and instructions will be transmitted to you by My Chart  Critical results will be called.   Followup as needed for any active or acute issue. Please report any significant change in your symptoms.

## 2014-08-13 ENCOUNTER — Other Ambulatory Visit (INDEPENDENT_AMBULATORY_CARE_PROVIDER_SITE_OTHER): Payer: Medicare Other

## 2014-08-13 DIAGNOSIS — R739 Hyperglycemia, unspecified: Secondary | ICD-10-CM | POA: Diagnosis not present

## 2014-08-13 LAB — HEMOGLOBIN A1C: Hgb A1c MFr Bld: 5 % (ref 4.6–6.5)

## 2014-08-20 ENCOUNTER — Other Ambulatory Visit: Payer: Self-pay | Admitting: Emergency Medicine

## 2014-08-20 DIAGNOSIS — J309 Allergic rhinitis, unspecified: Principal | ICD-10-CM

## 2014-08-20 DIAGNOSIS — H1010 Acute atopic conjunctivitis, unspecified eye: Secondary | ICD-10-CM

## 2014-08-20 MED ORDER — FLUTICASONE PROPIONATE 50 MCG/ACT NA SUSP: 1.0000 | Freq: Two times a day (BID) | NASAL | Status: DC | PRN

## 2014-09-08 DIAGNOSIS — L821 Other seborrheic keratosis: Secondary | ICD-10-CM | POA: Diagnosis not present

## 2014-09-08 DIAGNOSIS — D225 Melanocytic nevi of trunk: Secondary | ICD-10-CM | POA: Diagnosis not present

## 2014-09-23 DIAGNOSIS — N401 Enlarged prostate with lower urinary tract symptoms: Secondary | ICD-10-CM | POA: Diagnosis not present

## 2014-09-23 DIAGNOSIS — N138 Other obstructive and reflux uropathy: Secondary | ICD-10-CM | POA: Diagnosis not present

## 2014-09-23 DIAGNOSIS — R358 Other polyuria: Secondary | ICD-10-CM | POA: Diagnosis not present

## 2014-09-27 ENCOUNTER — Ambulatory Visit (INDEPENDENT_AMBULATORY_CARE_PROVIDER_SITE_OTHER): Payer: Medicare Other | Admitting: Internal Medicine

## 2014-09-27 ENCOUNTER — Encounter: Payer: Self-pay | Admitting: Internal Medicine

## 2014-09-27 VITALS — BP 138/78 | HR 75 | Temp 98.7°F | Resp 16 | Wt 210.0 lb

## 2014-09-27 DIAGNOSIS — J310 Chronic rhinitis: Secondary | ICD-10-CM | POA: Diagnosis not present

## 2014-09-27 DIAGNOSIS — L03113 Cellulitis of right upper limb: Secondary | ICD-10-CM | POA: Diagnosis not present

## 2014-09-27 DIAGNOSIS — J209 Acute bronchitis, unspecified: Secondary | ICD-10-CM

## 2014-09-27 DIAGNOSIS — T148 Other injury of unspecified body region: Secondary | ICD-10-CM | POA: Diagnosis not present

## 2014-09-27 DIAGNOSIS — Z23 Encounter for immunization: Secondary | ICD-10-CM

## 2014-09-27 DIAGNOSIS — Z8489 Family history of other specified conditions: Secondary | ICD-10-CM

## 2014-09-27 DIAGNOSIS — W5503XA Scratched by cat, initial encounter: Secondary | ICD-10-CM

## 2014-09-27 DIAGNOSIS — Z87898 Personal history of other specified conditions: Secondary | ICD-10-CM

## 2014-09-27 MED ORDER — AZITHROMYCIN 250 MG PO TABS: ORAL_TABLET | ORAL | Status: DC

## 2014-09-27 NOTE — Patient Instructions (Signed)
Please report warning signs as we discussed. Worrisome would be red streaks up the extremity, increased pain, fever, or pus production. 

## 2014-09-27 NOTE — Progress Notes (Signed)
Pre visit review using our clinic review tool, if applicable. No additional management support is needed unless otherwise documented below in the visit note. 

## 2014-09-27 NOTE — Progress Notes (Signed)
   Subjective:    Patient ID: Todd Larson, male    DOB: February 09, 1940, 74 y.o.   MRN: [id number]  HPI  Yesterday 9/ 25/16 he was scratched by cat at the pet store over the right lateral forearm. The cat is up-to-date on its immunizations. He now describes an aching pain up to level II in this area. He has a previous history of cat bites for which he took Augmentin. That resulted in severe diarrhea and perirectal irritation required he employ Sitz baths for 2 months.   For 2 weeks he's had no nasal congestion. He had yellow nasal discharge for 2 days which has resolved. He continues to cough up dirty white phlegm the mornings. He has some intermittent facial pressure. He has been using a nasal hygiene regimen.   He's had some slight dizziness with his new blood pressure medicine but this appears to have resolved.   He has no other upper respiratory or extrinsic symptoms.   Review of Systems Frontal headache, facial pain , nasal purulence, dental pain, sore throat , otic pain or otic discharge denied @ this time. No fever , chills or sweats. Extrinsic symptoms of itchy, watery eyes, sneezing, or angioedema are denied. There is no wheezing or  paroxysmal nocturnal dyspnea.    Objective:   Physical Exam Bilateral ptosis is present.  There is erythema of the nasal mucosa. The oropharynx cannot be visualized due to prominence of intraoral structures. Second heart sound is accentuated. He has a 2.5 x 3.5 area of erythema over the right lateral forearm with associated warmth to touch. There are several linear lacerations in this area which are closed.  General appearance:Adequately nourished; no acute distress or increased work of breathing is present.    Lymphatic: No  Lymphadenopathy of the head, neck, epitrochlear or axillary areas .  Eyes: No conjunctival inflammation or lid edema is present. There is no scleral icterus.  Ears:  External ear exam shows no significant lesions or deformities.   Otoscopic examination reveals clear canals, tympanic membranes are intact bilaterally without bulging, retraction, inflammation or discharge.  Nose:  External nasal examination shows no deformity or inflammation. No septal dislocation or deviation.No obstruction to airflow.   Oral exam: Dental hygiene is good; lips and gums are healthy appearing.There is no oropharyngeal erythema or exudate .  Neck:  No deformities, thyromegaly, masses, or tenderness noted.   Supple with full range of motion without pain.   Heart:  Normal rate and regular rhythm. S1 normal without gallop, murmur, click, rub or other extra sounds.   Lungs:Chest clear to auscultation; no wheezes, rhonchi,rales ,or rubs present.  Extremities:  No cyanosis, edema, or clubbing  noted    Skin: Warm & dry w/o tenting or jaundice.      Assessment & Plan:  #1 Cat scratch right forearm with secondary cellulitis  #2 acute bronchitis     #3 nonallergic rhinitis  #4 history of snoring; clinical exam is worrisome for obstruction of the oropharynx   See orders and recommendations

## 2014-10-01 ENCOUNTER — Encounter: Payer: Self-pay | Admitting: Internal Medicine

## 2014-10-05 NOTE — Telephone Encounter (Signed)
Please advise 

## 2014-10-11 ENCOUNTER — Ambulatory Visit (INDEPENDENT_AMBULATORY_CARE_PROVIDER_SITE_OTHER): Payer: Medicare Other | Admitting: Internal Medicine

## 2014-10-11 ENCOUNTER — Encounter: Payer: Self-pay | Admitting: Internal Medicine

## 2014-10-11 VITALS — BP 134/78 | HR 79 | Temp 98.3°F | Ht 68.0 in | Wt 218.0 lb

## 2014-10-11 DIAGNOSIS — I10 Essential (primary) hypertension: Secondary | ICD-10-CM | POA: Diagnosis not present

## 2014-10-11 DIAGNOSIS — R000 Tachycardia, unspecified: Secondary | ICD-10-CM | POA: Diagnosis not present

## 2014-10-11 DIAGNOSIS — R140 Abdominal distension (gaseous): Secondary | ICD-10-CM | POA: Diagnosis not present

## 2014-10-11 DIAGNOSIS — R194 Change in bowel habit: Secondary | ICD-10-CM

## 2014-10-11 MED ORDER — HYOSCYAMINE SULFATE 0.125 MG PO TBDP: 0.1250 mg | ORAL_TABLET | ORAL | Status: DC | PRN

## 2014-10-11 MED ORDER — METOPROLOL TARTRATE 25 MG PO TABS: 25.0000 mg | ORAL_TABLET | Freq: Two times a day (BID) | ORAL | Status: DC

## 2014-10-11 NOTE — Progress Notes (Signed)
   Subjective:    Patient ID: Todd Larson, male    DOB: Jan 16, 1940, 74 y.o.   MRN: [id number]  HPI After he took azithromycin for the cat scratch injuries he had loose stool for which he took a probiotic. Now he is having 3-4 bowel movements a day. They are solid but soft. In the morning he'll have a soft stool and then experience gaseousness with another soft stool 2-3 hours later. As stated this can occur up to 3-4 times per day.  He's tried Citrucel andic Metamucil in the past with limited benefit. What he describes as "crunchy vegetables" cause stool to be pasty. Symptoms are aggravated by the seeds in certain vegetables.  He now has rawness of the tissues of the buttocks and perirectal area from repeatedly wiping. He has used petroleum jelly with partial response.  Review of Systems Unexplained weight loss, abdominal pain, significant dyspepsia, dysphagia, melena, rectal bleeding, or persistently small caliber stools are denied.    Objective:   Physical Exam  Pertinent or positive findings include: He has bilateral ptosis. Slow S4 is present. Umbilical hernia is noted. He has a dry, erythematous, irregular rash over the buttocks. Anal area. There is no maceration, cellulitis, or pustule formation. Candida is not suggested.  General appearance :adequately nourished; in no distress.  Eyes: No conjunctival inflammation or scleral icterus is present.  Oral exam:  Lips and gums are healthy appearing.There is no oropharyngeal erythema or exudate noted. Dental hygiene is good.  Heart:  regular rhythm. S1 and S2 normal without gallop, murmur, click, or rub .    Lungs:Chest clear to auscultation; no wheezes, rhonchi,rales ,or rubs present.No increased work of breathing.   Abdomen: bowel sounds normal, soft and non-tender without masses, organomegaly or hernias noted.  No guarding or rebound.   Vascular : all pulses equal ; no bruits present.  Skin:Warm & dry.  Intact without suspicious  lesions or rashes ; no tenting or jaundice   Lymphatic: No lymphadenopathy is noted about the head, neck, axilla.   Neuro: Strength, tone & DTRs normal.      Assessment & Plan:  #1 stool changes in the context of gas  #2 skin irritation from repeated bowel movements  Plan: See orders and recommendations

## 2014-10-11 NOTE — Patient Instructions (Signed)
Use Eucerin or Aveeno Daily  Moisturizing Lotion  twice a day  for the irritated skin. Bathe with moisturizing liquid soap , not bar soap.

## 2014-10-11 NOTE — Progress Notes (Signed)
Pre visit review using our clinic review tool, if applicable. No additional management support is needed unless otherwise documented below in the visit note. 

## 2014-11-06 DIAGNOSIS — H2513 Age-related nuclear cataract, bilateral: Secondary | ICD-10-CM | POA: Diagnosis not present

## 2014-12-02 ENCOUNTER — Telehealth: Payer: Self-pay

## 2014-12-02 NOTE — Telephone Encounter (Signed)
Call to schedule AWV and agreed to come in 12/6 at 11am.

## 2014-12-07 ENCOUNTER — Ambulatory Visit (INDEPENDENT_AMBULATORY_CARE_PROVIDER_SITE_OTHER): Payer: Medicare Other

## 2014-12-07 VITALS — BP 154/84 | Ht 68.0 in | Wt 220.5 lb

## 2014-12-07 DIAGNOSIS — Z23 Encounter for immunization: Secondary | ICD-10-CM

## 2014-12-07 DIAGNOSIS — Z0000 Encounter for general adult medical examination without abnormal findings: Secondary | ICD-10-CM

## 2014-12-07 NOTE — Patient Instructions (Addendum)
Todd Larson , Thank you for taking time to come for your Medicare Wellness Visit. I appreciate your ongoing commitment to your health goals. Please review the following plan we discussed and let me know if I can assist you in the future.   Will take the prevnar today  Important Safety Information ZOSTAVAX does not protect everyone, so some people who get the vaccine may still get Shingles.  You should not get ZOSTAVAX if you are allergic to any of its ingredients, including gelatin or neomycin, have a weakened immune system, take high doses of steroids, or are pregnant or plan to become pregnant. You should not get ZOSTAVAX to prevent chickenpox.  Talk to your health care professional if you plan to get ZOSTAVAX at the same time as PNEUMOVAX23 (Pneumococcal Vaccine Polyvalent) because it may be better to get these vaccines at least 4 weeks apart.  Possible side effects include redness, pain, itching, swelling, hard lump, warmth, or bruising at the injection site, as well as headache.  ZOSTAVAX contains a weakened chickenpox virus. Tell your health care professional if you will be in close contact with newborn infants, someone who may be pregnant and has not had chickenpox or been vaccinated against chickenpox, or someone who has problems with their immune system. Your health care professional can tell you what situations you may need to avoid. You are encouraged to report negative side effects of prescription drugs to the FDA. Visit [employer], or call 1-800-FDA-1088.    These are the goals we discussed: Goals    . 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 Shingles Vaccine  07/17/2002  . Pneumonia vaccines (2 of 2 - PCV13) 10/01/2008  . Flu Shot  08/02/2015  . Colon Cancer Screening  02/06/2016  . Tetanus Vaccine  11/22/2018      Health  Maintenance, Male A healthy lifestyle and preventative care can promote health and wellness.  Maintain regular health, dental, and eye exams.  Eat a healthy diet. Foods like vegetables, fruits, whole grains, low-fat dairy products, and lean protein foods contain the nutrients you need and are low in calories. Decrease your intake of foods high in solid fats, added sugars, and salt. Get information about a proper diet from your health care provider, if necessary.  Regular physical exercise is one of the most important things you can do for your health. Most adults should get at least 150 minutes of moderate-intensity exercise (any activity that increases your heart rate and causes you to sweat) each week. In addition, most adults need muscle-strengthening exercises on 2 or more days a week.   Maintain a healthy weight. The body mass index (BMI) is a screening tool to identify possible weight problems. It provides an estimate of body fat based on height and weight. Your health care provider can find your BMI and can help you achieve or maintain a healthy weight. For males 20 years and older:  A BMI below 18.5 is considered underweight.  A BMI of 18.5 to 24.9 is normal.  A BMI of 25 to 29.9 is considered overweight.  A BMI of 30 and above is considered obese.  Maintain normal blood lipids and cholesterol by exercising and minimizing your intake of saturated fat. Eat a balanced diet with plenty of fruits and vegetables.  Blood tests for lipids and cholesterol should begin at age 67 and be repeated every 5 years. If your lipid or cholesterol levels are high, you are over age 6, or you are at high risk for heart disease, you may need your cholesterol levels checked more frequently.Ongoing high lipid and cholesterol levels should be treated with medicines if diet and exercise are not working.  If you smoke, find out from your health care provider how to quit. If you do not use tobacco, do not  start.  Lung cancer screening is recommended for adults aged 44-80 years who are at high risk for developing lung cancer because of a history of smoking. A yearly low-dose CT scan of the lungs is recommended for people who have at least a 30-pack-year history of smoking and are current smokers or have quit within the past 15 years. A pack year of smoking is smoking an average of 1 pack of cigarettes a day for 1 year (for example, a 30-pack-year history of smoking could mean smoking 1 pack a day for 30 years or 2 packs a day for 15 years). Yearly screening should continue until the smoker has stopped smoking for at least 15 years. Yearly screening should be stopped for people who develop a health problem that would prevent them from having lung cancer treatment.  If you choose to drink alcohol, do not have more than 2 drinks per day. One drink is considered to be 12 oz (360 mL) of beer, 5 oz (150 mL) of wine, or 1.5 oz (45 mL) of liquor.  Avoid the use of street drugs. Do not share needles with anyone. Ask for help if you need support or instructions about stopping the use of drugs.  High blood pressure causes heart disease and increases the risk of stroke. High blood pressure is more likely to develop in:  People who have blood pressure in the end of the normal range (100-139/85-89 mm Hg).  People who are overweight or obese.  People who are African American.  If you are 13-40 years of age, have your blood pressure checked every 3-5 years. If you are 37 years of age or older, have your blood pressure checked every year. You should have your blood pressure measured twice--once when you are at a hospital or clinic, and once when you are not at a hospital or clinic. Record the average of the two measurements. To check your blood pressure when you are not at a hospital or clinic, you can use:  An automated blood pressure machine at a pharmacy.  A home blood pressure monitor.  If you are 4-79 years  old, ask your health care provider if you should take aspirin to prevent heart disease.  Diabetes screening involves taking a blood sample to check your fasting blood sugar level. This should be done once every 3 years after age 17 if you are at a normal weight and without risk factors for diabetes. Testing should be considered at a younger age or be carried out more frequently if you are overweight and have at least 1 risk factor for diabetes.  Colorectal cancer can be detected and often prevented. Most routine colorectal cancer screening begins at the age of 54 and continues through age 38. However, your health care provider may recommend screening at an earlier age if you have risk factors for colon cancer. On a yearly basis, your health care provider may provide home test kits to check for hidden blood in the stool.  A small camera at the end of a tube may be used to directly examine the colon (sigmoidoscopy or colonoscopy) to detect the earliest forms of colorectal cancer. Talk to your health care provider about this at age 32 when routine screening begins. A direct exam of the colon should be repeated every 5-10 years through age 77, unless early forms of precancerous polyps or small growths are found.  People who are at an increased risk for hepatitis B should be screened for this virus. You are considered at high risk for hepatitis B if:  You were born in a country where hepatitis B occurs often. Talk with your health care provider about which countries are considered high risk.  Your parents were born in a high-risk country and you have not received a shot to protect against hepatitis B (hepatitis B vaccine).  You have HIV or AIDS.  You use needles to inject street drugs.  You live with, or have sex with, someone who has hepatitis B.  You are a man who has sex with other men (MSM).  You get hemodialysis treatment.  You take certain medicines for conditions like cancer, organ  transplantation, and autoimmune conditions.  Hepatitis C blood testing is recommended for all people born from [birth year] through 1963 and any individual with known risk factors for hepatitis C.  Healthy men should no longer receive prostate-specific antigen (PSA) blood tests as part of routine cancer screening. Talk to your health care provider about prostate cancer screening.  Testicular cancer screening is not recommended for adolescents or adult males who have no symptoms. Screening includes self-exam, a health care provider exam, and other screening tests. Consult with your health care provider about any symptoms you have or any concerns you have about testicular cancer.  Practice safe sex. Use condoms and avoid high-risk sexual practices to reduce the spread of sexually transmitted infections (STIs).  You should be screened for STIs, including gonorrhea and chlamydia if:  You are sexually active and are younger than 24 years.  You are older than 24 years, and your health care provider tells you that you are at risk for this type of infection.  Your sexual activity has changed since you were last screened, and you are at an increased risk for chlamydia or gonorrhea. Ask your health care provider if you are at risk.  If you are at risk of being infected with HIV, it is recommended that you take a prescription medicine daily to prevent HIV infection. This is called pre-exposure prophylaxis (PrEP). You are considered at risk if:  You are a man who has sex with other men (MSM).  You are a heterosexual man who is sexually active with multiple partners.  You take drugs by injection.  You are sexually active with a partner who has HIV.  Talk with your health care provider about whether you are at high risk of being infected with HIV. If you choose to begin PrEP, you should first be tested for HIV. You should then be tested every 3 months for as long as you are taking PrEP.  Use sunscreen. Apply  sunscreen liberally and repeatedly throughout the day. You should seek shade when your shadow is shorter than you. Protect yourself by wearing long sleeves, pants, a wide-brimmed hat, and sunglasses year round whenever you are outdoors.  Tell your health care provider of new moles or changes in moles, especially if there is a change in shape or color. Also, tell your health care provider  if a mole is larger than the size of a pencil eraser.  A one-time screening for abdominal aortic aneurysm (AAA) and surgical repair of large AAAs by ultrasound is recommended for men aged 64-75 years who are current or former smokers.  Stay current with your vaccines (immunizations).   This information is not intended to replace advice given to you by your health care provider. Make sure you discuss any questions you have with your health care provider.   Document Released: 06/16/2007 Document Revised: 01/08/2014 Document Reviewed: 05/15/2010 Elsevier Interactive Patient Education [employer].

## 2014-12-07 NOTE — Progress Notes (Signed)
Medical screening examination/treatment/procedure(s) were performed by non-physician practitioner and as supervising physician I was immediately available for consultation/collaboration. I agree with above. Kardell Virgil, MD   

## 2014-12-07 NOTE — Progress Notes (Signed)
Subjective:   Todd Larson is a 74 y.o. male who presents for Medicare Annual/Subsequent preventive examination.  Review of Systems:   HRA assessment completed during visit; [place] The Patient was informed that this wellness visit is to identify risk and educate on how to reduce risk for increase disease through lifestyle changes.   ROS deferred to CPE exam with physician;  Major problem with neck; at C5; secondary to working on a computer; pain at times going down tricept; c/o of 3 to 4 exacerbations per year until he started volunteering and standing; noted when he stood for 4  to 6 hours, he did not have any shoulder issues/  Incidentally found standing has helped muscle tone; Lifestyle of traveling; hard on family lifestyle; Lives alone at present   Psychosocial Mother is 17 and still living Father died at 51  Grandmother was 6  MGF had lung cancer Uncle Heart disease   Medical issues Htn;  fluctuates even at home; Generally can run 160/and then drop to 110/70 at home; BP elevated today, but states he checks it at home; Discussed sodium but states his legs cramps in the am, but this is generally avoided with Gatorade;  Small inquinal hernia secondary to old injury that retracts and has not determined surgery to be necessary but exercise as Nordic track will exacerbate hernia.   Lipids 2012 tri 137; HDL 36; LDL 114; ratio 5 (A1c 08/2014 was 5.0)  BMI: 33.4; lost 10lbs last year; attributes this to being more active;  Diet; breakfast; protein bar; fiber bar; no cereal; OJ;  Lunch: depends; crackers; some days burger [employer]  Eat a lot of fish; soft vegetables; squash; etc but can't tolerate raw or "hard vegetables".  Meat on occasion;   Exercise; Does not exercise in gym or other; but is not sedentary; Volunteer work requires him to lift; bend; and on his feet x 4 to 6 hours.  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 States exercise is limited secondary to inq hernia and neck discomfort inhibits lifting or weights Drinks lots of fluids; soda; glass of milk; wine; 1 to 2 glasses per day  SAFETY one level home; apt; no immediate family in this area; aware of the options for the long term  Safety reviewed for the home; clear paths through the home; bathroom combo tub and shower; community safe; smoke detectors  Driving accidents no  Sun protection; not out in the sun  No falls;   Medication reviewed; no issues;  Mobilization and Functional losses in the last year.  Sleep patterns; tempur-pedic bed; no lower back problems since  Urinary or fecal incontinence reviewed/  Uses finasteride and BP medicine; the rest is prn  Urinary issues resolved medically   Counseling: Colonoscopy; 02/2006  EKG: 08/2012 Hearing: 4000hz  both ears  Ophthalmology exam; last month; no glaucoma; early signs of cataracts/ retinal scan and wears glasses   Immunizations Due  Zostavax; had shingles x 36 years ago; ? 2nd case of shingles questionable;  Prevnar 13 - agreed to take today  Current Care Team reviewed and updated    Cardiac Risk Factors include: advanced age (>19men, >3 women);dyslipidemia;hypertension;obesity (BMI >30kg/m2)     Objective:    Vitals: BP 154/84 mmHg  Ht 5\' 8"  (1.727 m)  Wt 220 lb 8 oz (100.018 kg)  BMI 33.53 kg/m2  Tobacco History  Smoking status  . Never Smoker  Smokeless tobacco  . Never Used     Counseling given: Yes   Past Medical History  Diagnosis Date  . GERD (gastroesophageal reflux disease)   . PSA elevation     History of   . Diverticulosis of colon   . Bulging discs      thoracic spine   Past Surgical History  Procedure Laterality Date  . Tonsillectomy    . Colonoscopy  2008    3 internal hemorrhoids, diverticulosis;Dr.Jacobs  . Prostate biopsy      x 2, once abnormal, once normal   Family History  Problem Relation Age of Onset  . Lung  cancer Maternal Grandfather   . Heart attack Paternal Uncle   . Prostate cancer Brother    History  Sexual Activity  . Sexual Activity: Not on file    Outpatient Encounter Prescriptions as of 12/07/2014  Medication Sig  . AMBULATORY NON FORMULARY MEDICATION Medication Name: Neck Traction Device, DX: Cervical Discomfort/irritation  . amitriptyline (ELAVIL) 25 MG tablet at bedtime as needed. 1/2 qhs prn for LBP  . cyclobenzaprine (FLEXERIL) 5 MG tablet Take 1 tablet (5 mg total) by mouth as directed. 1 by mouth twice daily and 1-2 at bedtime if needed  . finasteride (PROSCAR) 5 MG tablet Take 5 mg by mouth daily.  . fluticasone (FLONASE) 50 MCG/ACT nasal spray Place 1 spray into both nostrils 2 (two) times daily as needed for rhinitis.  Marland Kitchen gabapentin (NEURONTIN) 100 MG capsule Take 1 capsule (100 mg total) by mouth 3 (three) times daily. (Patient taking differently: Take 100 mg by mouth 3 (three) times daily as needed. )  . hyoscyamine (ANASPAZ) 0.125 MG TBDP disintergrating tablet Place 1 tablet (0.125 mg total) under the tongue every 4 (four) hours as needed.  . meloxicam (MOBIC) 7.5 MG tablet 1 tablet twice daily as needed.  . metoprolol tartrate (LOPRESSOR) 25 MG tablet Take 1 tablet (25 mg total) by mouth 2 (two) times daily.  . montelukast (SINGULAIR) 10 MG tablet Take 1 tablet (10 mg total) by mouth at bedtime. (Patient taking differently: Take 10 mg by mouth as needed. )  . traMADol (ULTRAM) 50 MG tablet TAKE 1 TABLET BY MOUTH EVERY 6 HOURS AS NEEDED  . [DISCONTINUED] azithromycin (ZITHROMAX Z-PAK) 250 MG tablet 2 day 1, then 1 qd (Patient not taking: Reported on 10/11/2014)   No facility-administered encounter medications on file as of 12/07/2014.    Activities of Daily Living In your present state of health, do you have any difficulty performing the following activities: 12/07/2014 02/15/2014  Hearing? N N  Vision? N N  Difficulty concentrating or making decisions? N N  Walking or  climbing stairs? N N  Dressing or bathing? N N  Doing errands, shopping? N N  Preparing Food and eating ? N -  Using the Toilet? N -  In the past six months, have you accidently leaked urine? N -  Do you have problems with loss of bowel control? N -  Managing your Medications? N -  Managing your Finances? N -  Housekeeping or managing your Housekeeping? N -    Patient Care Team: Hendricks Limes, MD as PCP - General   Assessment:   Assessment   Patient presents for yearly preventative medicine examination. Medicare questionnaire screening were completed, i.e. Functional; fall risk; depression, memory loss and hearing all unremarkable   All immunizations and health maintenance protocols were reviewed with the patient and did take prevnar 13 today; Will consider shingles;  will fup with insurance carrier on cost.   Education provided for laboratory screens;  Reviewed chol and  A1c; discussed HDL and exercise but physical issues inhibit exercise; enjoys staying active with volunteer work but not aerobic activity.   Medication reconciliation, past medical history, social history, problem list and allergies were reviewed in detail with the patient  Goals were established with regard to continuing to expand his volunteer role   End of life planning was discussed. Would like to consider as he does not have close person but for HCPOA; but discussed his brother up [place]. Will consider at a later time;     Exercise Activities and Dietary recommendations Current Exercise Habits:: Exercise is limited by (is actively engaged 3 days a week with volunteer work)  Goals    . 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 No No No   Depression Screen PHQ 2/9 Scores 12/07/2014 02/15/2014 12/15/2012  PHQ - 2 Score 0 0 0    Cognitive Testing MMSE - Mini Mental State Exam 12/07/2014  Not  completed: (No Data)   AD8 Score 0; loves to work Designer, industrial/product puzzles;   Immunization History  Administered Date(s) Administered  . H1N1 12/24/2007  . Influenza Split 10/03/2010, 10/23/2011  . Influenza Whole 11/21/2006, 09/25/2007, 09/16/2009  . Influenza, High Dose Seasonal PF 10/30/2012  . Influenza,inj,Quad PF,36+ Mos 10/30/2013, 09/27/2014  . Pneumococcal Conjugate-13 12/07/2014  . Pneumococcal Polysaccharide-23 10/02/2007  . Td 11/21/2008   Screening Tests Health Maintenance  Topic Date Due  . ZOSTAVAX  07/17/2002  . PNA vac Low Risk Adult (2 of 2 - PCV13) 10/01/2008  . INFLUENZA VACCINE  08/02/2015  . COLONOSCOPY  02/06/2016  . TETANUS/TDAP  11/22/2018      Plan:     Rickard Patience today Postpone shingles for now; will explore cost;   During the course of the visit the patient was educated and counseled about the following appropriate screening and preventive services:   Vaccines to include Pneumoccal, Influenza, Hepatitis B, Td, Zostavax, HCV/   Electrocardiogram/ 08/2012  Cardiovascular Disease /HTN; BP runs mod high but is low most of the time at home; Feels he needs sodium to avoid muscle cramps;   Colorectal cancer screening completed 02/2006 / due 02/2016  Diabetes screening/ 5   Prostate Cancer Screening/ psa decreased;   Glaucoma screening; neg x eye exam a month ago  Nutrition counseling / discussed at length; the patient states he lost 10 lbs since last year; Eats well but does not express a desire to work on weight at this time.   Smoking cessation counseling/ na/  Patient Instructions (the written plan) was given to the patient.    Wynetta Fines, RN  12/07/2014

## 2014-12-23 ENCOUNTER — Encounter (HOSPITAL_COMMUNITY): Payer: Self-pay | Admitting: Emergency Medicine

## 2014-12-23 ENCOUNTER — Emergency Department (INDEPENDENT_AMBULATORY_CARE_PROVIDER_SITE_OTHER)
Admission: EM | Admit: 2014-12-23 | Discharge: 2014-12-23 | Disposition: A | Discharge status: Home / Self Care | Payer: Medicare Other | Source: Home / Self Care | Attending: Family Medicine | Admitting: Family Medicine

## 2014-12-23 DIAGNOSIS — J069 Acute upper respiratory infection, unspecified: Secondary | ICD-10-CM | POA: Diagnosis not present

## 2014-12-23 NOTE — ED Notes (Signed)
Here with possible acute sinus infection that started last week with dry sinuses, now increased thick, greenish nasal drainage with blood tinged increasing Denies chest pain, fever or sob Using nasal saline and Flonase spray with some relief

## 2014-12-23 NOTE — ED Provider Notes (Signed)
CSN: [id number]     Arrival date & time 12/23/14  1539 History   First MD Initiated Contact with Patient 12/23/14 1631     Chief Complaint  Patient presents with  . Sinus Problem   (Consider location/radiation/quality/duration/timing/severity/associated sxs/prior Treatment) Patient is a 74 y.o. male presenting with sinus complaint. The history is provided by the patient. No language interpreter was used.  Sinus Problem  Patient with complaint of 1 week of upper respiratory symptoms, most prominently nasal discharge that started clear and over the course of the week turned green and with tinges of blood. Using Flonase, was using Mucinex initially but stopped recently. No fevers or chills, some mild cough today but none earlier. Says he does not feel ill, except for the nasal discharge. No ear symptoms, no nausea/vomiting.   Recently had a cat he was quarantining in his bedroom with the heat up and the door closed, caused his bedroom to be very dry and hot.  Recently took the cat out of quarantine and able to open bedroom door.   Past Medical History  Diagnosis Date  . GERD (gastroesophageal reflux disease)   . PSA elevation     History of   . Diverticulosis of colon   . Bulging discs      thoracic spine   Past Surgical History  Procedure Laterality Date  . Tonsillectomy    . Colonoscopy  2008    3 internal hemorrhoids, diverticulosis;Dr.Jacobs  . Prostate biopsy      x 2, once abnormal, once normal   Family History  Problem Relation Age of Onset  . Lung cancer Maternal Grandfather   . Heart attack Paternal Uncle   . Prostate cancer Brother    Social History  Substance Use Topics  . Smoking status: Never Smoker   . Smokeless tobacco: Never Used  . Alcohol Use: 0.0 oz/week    0 Standard drinks or equivalent per week     Comment: social/ drinks wine    Review of Systems  Constitutional: Negative for fever, chills, diaphoresis, appetite change and fatigue.  HENT: Positive  for congestion, nosebleeds, postnasal drip and rhinorrhea. Negative for sinus pressure, sore throat and trouble swallowing.   Respiratory: Negative for cough and chest tightness.   All other systems reviewed and are negative.   Allergies  Amoxicillin-pot clavulanate  Home Medications   Prior to Admission medications   Medication Sig Start Date End Date Taking? Authorizing Provider  AMBULATORY NON FORMULARY MEDICATION Medication Name: Neck Traction Device, DX: Cervical Discomfort/irritation 04/07/12   Hendricks Limes, MD  amitriptyline (ELAVIL) 25 MG tablet at bedtime as needed. 1/2 qhs prn for LBP 12/15/12   Hendricks Limes, MD  cyclobenzaprine (FLEXERIL) 5 MG tablet Take 1 tablet (5 mg total) by mouth as directed. 1 by mouth twice daily and 1-2 at bedtime if needed 09/17/13   Hendricks Limes, MD  finasteride (PROSCAR) 5 MG tablet Take 5 mg by mouth daily.    Historical Provider, MD  fluticasone (FLONASE) 50 MCG/ACT nasal spray Place 1 spray into both nostrils 2 (two) times daily as needed for rhinitis. 08/20/14   Hendricks Limes, MD  gabapentin (NEURONTIN) 100 MG capsule Take 1 capsule (100 mg total) by mouth 3 (three) times daily. Patient taking differently: Take 100 mg by mouth 3 (three) times daily as needed.  10/09/13   Lyndal Pulley, DO  hyoscyamine (ANASPAZ) 0.125 MG TBDP disintergrating tablet Place 1 tablet (0.125 mg total) under the tongue every  4 (four) hours as needed. 10/11/14   Hendricks Limes, MD  meloxicam (MOBIC) 7.5 MG tablet 1 tablet twice daily as needed. 05/29/12   Hendricks Limes, MD  metoprolol tartrate (LOPRESSOR) 25 MG tablet Take 1 tablet (25 mg total) by mouth 2 (two) times daily. 10/11/14   Hendricks Limes, MD  montelukast (SINGULAIR) 10 MG tablet Take 1 tablet (10 mg total) by mouth at bedtime. Patient taking differently: Take 10 mg by mouth as needed.  09/09/13   Hendricks Limes, MD  traMADol (ULTRAM) 50 MG tablet TAKE 1 TABLET BY MOUTH EVERY 6 HOURS AS NEEDED  10/22/13   Lyndal Pulley, DO   Meds Ordered and Administered this Visit  Medications - No data to display  BP 136/58 mmHg  Pulse 105  Temp(Src) 98.4 F (36.9 C) (Oral)  Resp 20  SpO2 99% No data found.   Physical Exam  Constitutional: He appears well-developed and well-nourished. No distress.  HENT:  Head: Normocephalic and atraumatic.  Right Ear: External ear normal.  Left Ear: External ear normal.  Mouth/Throat: Oropharynx is clear and moist.  Dry nasal mucosa with areas of erosive mucosa with scant amount of blood.  No frontal or maxillary sinus tenderness.  Clear oropharynx with moist mucus membranes.   Eyes: Conjunctivae and EOM are normal. Pupils are equal, round, and reactive to light. Right eye exhibits no discharge.  Neck: Normal range of motion. Neck supple. No thyromegaly present.  Cardiovascular: Normal rate, regular rhythm and normal heart sounds.   Pulmonary/Chest: Effort normal and breath sounds normal. No respiratory distress. He has no wheezes. He has no rales. He exhibits no tenderness.  Lymphadenopathy:    He has no cervical adenopathy.  Skin: He is not diaphoretic.    ED Course  Procedures (including critical care time)  Labs Review Labs Reviewed - No data to display  Imaging Review No results found.   Visual Acuity Review  Right Eye Distance:   Left Eye Distance:   Bilateral Distance:    Right Eye Near:   Left Eye Near:    Bilateral Near:         MDM   1. Upper respiratory infection, acute    Patient with reassuring exam, most likely viral respiratory infection. Nothing to suggest bacterial sinusitis, which is his biggest concern. Supportive therapy discussed, as well as indications for return or follow up with his primary doctor.     Willeen Niece, MD 12/23/14 [phone]

## 2014-12-23 NOTE — Discharge Instructions (Signed)
It was a pleasure to see you today. I believe your nasal symptoms are related to a viral respiratory infection, coupled with the dryness in your home environment.   For the nasal symptoms, I recommend you continue using the nasal saline before the Flonase spray every day; increase the Mucinex 600mg  tablets to 2 tablets (1200mg ) by mouth twice daily to ensure adequate drainage of nasal secretions.   You may use A&D ointment in the nose before bedtime, to keep your nose from drying out.   Cool mist vaporizer, sitting in bathroom with hot shower running, or other ways to maintain humidity to prevent drying of nasal passages.   Follow up with primary doctor or [facility] if you experience fevers/chills, begin to feel ill, if you have facial/upper dental pain related to this episode of illness, or with other concerns.

## 2014-12-31 ENCOUNTER — Encounter: Payer: Self-pay | Admitting: Internal Medicine

## 2014-12-31 ENCOUNTER — Ambulatory Visit (INDEPENDENT_AMBULATORY_CARE_PROVIDER_SITE_OTHER): Payer: Medicare Other | Admitting: Internal Medicine

## 2014-12-31 VITALS — BP 138/70 | HR 84 | Temp 98.6°F | Ht 68.0 in | Wt 221.0 lb

## 2014-12-31 DIAGNOSIS — J310 Chronic rhinitis: Secondary | ICD-10-CM | POA: Diagnosis not present

## 2014-12-31 MED ORDER — AMOXICILLIN 500 MG PO CAPS: 500.0000 mg | ORAL_CAPSULE | Freq: Three times a day (TID) | ORAL | Status: DC

## 2014-12-31 MED ORDER — HYDROCODONE-HOMATROPINE 5-1.5 MG/5ML PO SYRP: 5.0000 mL | ORAL_SOLUTION | Freq: Four times a day (QID) | ORAL | Status: DC | PRN

## 2014-12-31 NOTE — Progress Notes (Signed)
   Subjective:    Patient ID: Todd Larson, male    DOB: [birth year], 74 y.o.   MRN: [id number]  HPI His symptoms began 2 weeks ago as drying of the nasal mucosa associated with dry cough. It was related to decreased humidity in his bedroom . The symptoms were initially associated with epistaxis which was self-limited as well as purulent discharge. The symptoms have improved with Flonase and Mucinex.  He now has frontal sinus congestion. Cough is nonproductive but disturbing sleep.  Review of Systems  He denies fever or chills. He has no itchy, watery eyes, sneezing.  There is no associated shortness of breath and wheezing with cough     Objective:   Physical Exam There is profound erythema of the nasal mucosa. Abdomen is protuberant. He has an umbilical hernia.   General appearance:Adequately nourished; no acute distress or increased work of breathing is present.    Lymphatic: No  lymphadenopathy about the head, neck, or axilla .  Eyes: No conjunctival inflammation or lid edema is present. There is no scleral icterus.  Ears:  External ear exam shows no significant lesions or deformities.  Otoscopic examination reveals clear canals, tympanic membranes are intact bilaterally without bulging, retraction, inflammation or discharge.  Nose:  External nasal examination shows no deformity or inflammation. No septal dislocation or deviation.No obstruction to airflow.   Oral exam: Dental hygiene is good; lips and gums are healthy appearing.There is no oropharyngeal erythema or exudate .  Neck:  No deformities, thyromegaly, masses, or tenderness noted.   Supple with full range of motion without pain.   Heart:  Normal rate and regular rhythm. S1 and S2 normal without gallop, murmur, click, rub or other extra sounds.   Lungs:Chest clear to auscultation; no wheezes, rhonchi,rales ,or rubs present.  Extremities:  No cyanosis, edema, or clubbing  noted    Skin: Warm & dry w/o tenting or  jaundice. No significant lesions or rash.     Assessment & Plan:  1Rhinitis; nonallergic  See orders and AVS

## 2014-12-31 NOTE — Patient Instructions (Addendum)
Plain Mucinex (NOT D) for thick secretions ;force NON dairy fluids .   Nasal cleansing in the shower as discussed with lather of mild shampoo.After 10 seconds wash off lather while  exhaling through nostrils. Make sure that all residual soap is removed to prevent irritation.  Flonase OR Nasacort AQ 1 spray in each nostril twice a day as needed. Use the "crossover" technique into opposite nostril spraying toward opposite ear @ 45 degree angle, not straight up into nostril.  Plain Allegra (NOT D )  160 daily , Loratidine 10 mg , OR Zyrtec 10 mg @ bedtime  as needed for itchy eyes & sneezing.  Zicam Melts or Zinc lozenges as per package label for sore throat .  Complementary options to boost immunity include  vitamin C 2000 mg daily; & Echinacea for 4-7 days.  Fill the  prescription for antibiotic if fever; discolored nasal or chest secretions; or frontal headache or facial pain  present   Use Eucerin   twice a day  for the dry nasal septum. The drying & inflammation cause nose bleeds.

## 2014-12-31 NOTE — Progress Notes (Signed)
Pre visit review using our clinic review tool, if applicable. No additional management support is needed unless otherwise documented below in the visit note. 

## 2015-01-19 ENCOUNTER — Telehealth: Payer: Self-pay | Admitting: Internal Medicine

## 2015-01-19 NOTE — Telephone Encounter (Signed)
    I just accepted several new patients in the last few days, I declined at this point however with have other providers in the office including 2 new doctors coming in few months   JP       Previous Messages     ----- Message -----   From: Nani Ravens   Sent: 01/14/2015 10:37 AM    To: Colon Branch, MD  Subject: New Pt request                  Caller name: Eddie Dibbles   Relationship to patient: Self   Can be reached: [phone]   Reason for call: Pt says that he was a pt of Dr. Linna Darner and would like to know if you could take him on as a np. Informed pt that you are not currently accepting np but I wanted to ask you just in case you are okay with it.   Please advise me further.

## 2015-01-19 NOTE — Telephone Encounter (Signed)
Informed pt. He says that he will look into getting established with a different provider.

## 2015-01-21 ENCOUNTER — Encounter: Payer: Self-pay | Admitting: Nurse Practitioner

## 2015-01-21 ENCOUNTER — Ambulatory Visit (INDEPENDENT_AMBULATORY_CARE_PROVIDER_SITE_OTHER): Payer: Medicare Other | Admitting: Nurse Practitioner

## 2015-01-21 VITALS — BP 142/80 | HR 96 | Temp 98.6°F | Resp 16 | Ht 68.0 in | Wt 230.0 lb

## 2015-01-21 DIAGNOSIS — M25521 Pain in right elbow: Secondary | ICD-10-CM | POA: Diagnosis not present

## 2015-01-21 NOTE — Assessment & Plan Note (Signed)
Improving on its own.  No reason for X-ray due to very low suspicion of fracture.  Advised continue with ice, rest, elevate, and NSAIDs as needed.  Pt verbalized agreement  FU prn worsening/failure to improve.

## 2015-01-21 NOTE — Progress Notes (Signed)
Pre visit review using our clinic review tool, if applicable. No additional management support is needed unless otherwise documented below in the visit note. 

## 2015-01-21 NOTE — Progress Notes (Signed)
Patient ID: Todd Larson, male    DOB: 1940/01/16  Age: 75 y.o. MRN: [id number]  CC: Elbow Pain   HPI Todd Larson presents for right knee and elbow pain.   1) Fell in the snow 2 weeks ago.  Aching intermittent right elbow pain Ice is only treatment to date Pain is mild   History Todd Larson has a past medical history of GERD (gastroesophageal reflux disease); PSA elevation; Diverticulosis of colon; and Bulging discs.   Todd Larson has past surgical history that includes Tonsillectomy; Colonoscopy (2008); and Prostate biopsy.   Todd Larson family history includes Heart attack in Todd Larson paternal uncle; Lung cancer in Todd Larson maternal grandfather; Prostate cancer in Todd Larson brother.Todd Larson reports that Todd Larson has never smoked. Todd Larson has never used smokeless tobacco. Todd Larson reports that Todd Larson drinks alcohol. Todd Larson reports that Todd Larson does not use illicit drugs.  Outpatient Prescriptions Prior to Visit  Medication Sig Dispense Refill  . AMBULATORY NON FORMULARY MEDICATION Medication Name: Neck Traction Device, DX: Cervical Discomfort/irritation 1 each 0  . amitriptyline (ELAVIL) 25 MG tablet at bedtime as needed. 1/2 qhs prn for LBP    . cyclobenzaprine (FLEXERIL) 5 MG tablet Take 1 tablet (5 mg total) by mouth as directed. 1 by mouth twice daily and 1-2 at bedtime if needed 30 tablet 20  . finasteride (PROSCAR) 5 MG tablet Take 5 mg by mouth daily.    . fluticasone (FLONASE) 50 MCG/ACT nasal spray Place 1 spray into both nostrils 2 (two) times daily as needed for rhinitis. 16 g 11  . gabapentin (NEURONTIN) 100 MG capsule Take 1 capsule (100 mg total) by mouth 3 (three) times daily. (Patient taking differently: Take 100 mg by mouth 3 (three) times daily as needed. ) 90 capsule 3  . hyoscyamine (ANASPAZ) 0.125 MG TBDP disintergrating tablet Place 1 tablet (0.125 mg total) under the tongue every 4 (four) hours as needed. 30 tablet 1  . meloxicam (MOBIC) 7.5 MG tablet 1 tablet twice daily as needed.    . metoprolol tartrate (LOPRESSOR) 25 MG tablet Take 1  tablet (25 mg total) by mouth 2 (two) times daily. 60 tablet 5  . montelukast (SINGULAIR) 10 MG tablet Take 1 tablet (10 mg total) by mouth at bedtime. (Patient taking differently: Take 10 mg by mouth as needed. ) 30 tablet 3  . traMADol (ULTRAM) 50 MG tablet TAKE 1 TABLET BY MOUTH EVERY 6 HOURS AS NEEDED 30 tablet 1  . amoxicillin (AMOXIL) 500 MG capsule Take 1 capsule (500 mg total) by mouth 3 (three) times daily. 21 capsule 0  . HYDROcodone-homatropine (HYDROMET) 5-1.5 MG/5ML syrup Take 5 mLs by mouth every 6 (six) hours as needed for cough. 120 mL 0   No facility-administered medications prior to visit.    ROS Review of Systems  Constitutional: Negative for fever, chills, diaphoresis and fatigue.  Musculoskeletal: Positive for arthralgias. Negative for myalgias, back pain, joint swelling, gait problem, neck pain and neck stiffness.       Right elbow pain  Skin: Positive for wound.       Small healing scab on right patella    Objective:  BP 142/80 mmHg  Pulse 96  Temp(Src) 98.6 F (37 C) (Oral)  Resp 16  Ht 5\' 8"  (1.727 m)  Wt 230 lb (104.327 kg)  BMI 34.98 kg/m2  SpO2 98%  Physical Exam  Constitutional: Todd Larson is oriented to person, place, and time. Todd Larson appears well-developed and well-nourished. No distress.  Musculoskeletal: Normal range of motion. Todd Larson exhibits tenderness.  Todd Larson exhibits no edema.       Arms: 1 inch area of old ecchymosis, yellow, and slightly tender to palpation, ulna feels intact, ROM normal passive and against resistance  Neurological: Todd Larson is alert and oriented to person, place, and time. Todd Larson has normal reflexes. Todd Larson exhibits normal muscle tone. Coordination normal.  Upper right extremity DTR 2+ Grip strength normal bilaterally   Skin: Todd Larson is not diaphoretic.  Psychiatric: Todd Larson has a normal mood and affect. Todd Larson behavior is normal. Judgment and thought content normal.    Assessment & Plan:   Todd Larson was seen today for elbow pain.  Diagnoses and all orders for this  visit:  Right elbow pain  I have discontinued Todd Larson HYDROcodone-homatropine and amoxicillin. I am also having him maintain Todd Larson AMBULATORY NON FORMULARY MEDICATION, meloxicam, amitriptyline, montelukast, cyclobenzaprine, gabapentin, traMADol, finasteride, fluticasone, metoprolol tartrate, and hyoscyamine.  No orders of the defined types were placed in this encounter.     Follow-up: Return if symptoms worsen or fail to improve.

## 2015-02-07 ENCOUNTER — Encounter: Payer: Self-pay | Admitting: Internal Medicine

## 2015-02-07 ENCOUNTER — Ambulatory Visit (INDEPENDENT_AMBULATORY_CARE_PROVIDER_SITE_OTHER): Payer: Medicare Other | Admitting: Internal Medicine

## 2015-02-07 VITALS — BP 122/60 | HR 96 | Ht 68.0 in | Wt 222.4 lb

## 2015-02-07 DIAGNOSIS — G4733 Obstructive sleep apnea (adult) (pediatric): Secondary | ICD-10-CM | POA: Diagnosis not present

## 2015-02-07 DIAGNOSIS — J310 Chronic rhinitis: Secondary | ICD-10-CM

## 2015-02-07 NOTE — Patient Instructions (Addendum)
Order- schedule unattended Home Sleep Test    Dx OSA

## 2015-02-07 NOTE — Assessment & Plan Note (Signed)
Not clear if there is significant allergic component. He describes mild stuffiness blamed on previous nasal fractures. I don't see obvious mechanical obstruction anteriorly. Okay to try Flonase.

## 2015-02-07 NOTE — Assessment & Plan Note (Signed)
Pharyngeal spacing and history of snoring is moderate risk apnea we discussed physiology and medical concerns. Plan-sleep study

## 2015-02-07 NOTE — Progress Notes (Signed)
02/07/2015-75 year old male never smoker referred courtesy of Dr Linna Darner; snoring-has been told by others that he snores; never woken up gasping for air. No Sleep Study. Lives alone, no witnesses. Told of snoring by past travel companions. No ENT surgery. Past nasal fracture. He usually sleeps well. No significant daytime sleepiness. No caffeine. No sleep aids Sleep quality has been affected some by degenerative disc disease in cervical spine. Medical management for hypertension area minimal seasonal nasal congestion. Lives with 6 cats. [place] 2/24  Prior to Admission medications   Medication Sig Start Date End Date Taking? Authorizing Provider  amitriptyline (ELAVIL) 25 MG tablet at bedtime as needed. 1/2 qhs prn for LBP 12/15/12  Yes Hendricks Limes, MD  cyclobenzaprine (FLEXERIL) 5 MG tablet Take 1 tablet (5 mg total) by mouth as directed. 1 by mouth twice daily and 1-2 at bedtime if needed 09/17/13  Yes Hendricks Limes, MD  finasteride (PROSCAR) 5 MG tablet Take 5 mg by mouth daily.   Yes Historical Provider, MD  fluticasone (FLONASE) 50 MCG/ACT nasal spray Place 1 spray into both nostrils 2 (two) times daily as needed for rhinitis. 08/20/14  Yes Hendricks Limes, MD  gabapentin (NEURONTIN) 100 MG capsule Take 1 capsule (100 mg total) by mouth 3 (three) times daily. Patient taking differently: Take 100 mg by mouth 3 (three) times daily as needed.  10/09/13  Yes Lyndal Pulley, DO  hyoscyamine (ANASPAZ) 0.125 MG TBDP disintergrating tablet Place 1 tablet (0.125 mg total) under the tongue every 4 (four) hours as needed. 10/11/14  Yes Hendricks Limes, MD  meloxicam (MOBIC) 7.5 MG tablet 1 tablet twice daily as needed. 05/29/12  Yes Hendricks Limes, MD  metoprolol tartrate (LOPRESSOR) 25 MG tablet Take 1 tablet (25 mg total) by mouth 2 (two) times daily. 10/11/14  Yes Hendricks Limes, MD  montelukast (SINGULAIR) 10 MG tablet Take 1 tablet (10 mg total) by mouth at bedtime. Patient taking  differently: Take 10 mg by mouth as needed.  09/09/13  Yes Hendricks Limes, MD  traMADol (ULTRAM) 50 MG tablet TAKE 1 TABLET BY MOUTH EVERY 6 HOURS AS NEEDED 10/22/13  Yes Lyndal Pulley, DO  AMBULATORY NON FORMULARY MEDICATION Medication Name: Neck Traction Device, DX: Cervical Discomfort/irritation Patient not taking: Reported on 02/07/2015 04/07/12   Hendricks Limes, MD   Past Medical History  Diagnosis Date  . GERD (gastroesophageal reflux disease)   . PSA elevation     History of   . Diverticulosis of colon   . Bulging discs      thoracic spine   Past Surgical History  Procedure Laterality Date  . Tonsillectomy    . Colonoscopy  2008    3 internal hemorrhoids, diverticulosis;Dr.Jacobs  . Prostate biopsy      x 2, once abnormal, once normal   Family History  Problem Relation Age of Onset  . Lung cancer Maternal Grandfather   . Heart attack Paternal Uncle   . Prostate cancer Brother    Social History   Social History  . Marital Status: Single    Spouse Name: N/A  . Number of Children: 0  . Years of Education: N/A   Occupational History  . retired    Social History Main Topics  . Smoking status: Never Smoker   . Smokeless tobacco: Never Used  . Alcohol Use: 1.2 oz/week    2 Glasses of wine per week     Comment: social/ drinks wine(white)  . Drug Use:  No  . Sexual Activity: Not on file   Other Topics Concern  . Not on file   Social History Narrative   ROS-see HPI   Negative unless "+" Constitutional:    weight loss, night sweats, fevers, chills, fatigue, lassitude. HEENT:    headaches, difficulty swallowing, tooth/dental problems, sore throat,       sneezing, itching, ear ache, + nasal congestion, post nasal drip, snoring CV:    chest pain, orthopnea, PND, swelling in lower extremities, anasarca,                                                     dizziness, palpitations Resp:   shortness of breath with exertion or at rest.                productive cough,    non-productive cough, coughing up of blood.              change in color of mucus.  wheezing.   Skin:    rash or lesions. GI:  No-   heartburn, indigestion, abdominal pain, nausea, vomiting, diarrhea,                 change in bowel habits, loss of appetite GU: dysuria, change in color of urine, no urgency or frequency.   flank pain. MS:   joint pain, stiffness, decreased range of motion, back pain. Neuro-     nothing unusual Psych:  change in mood or affect.  depression or anxiety.   memory loss.  OBJ- Physical Exam General- Alert, Oriented, Affect-appropriate, Distress- none acute, + mild overweight Skin- rash-none, lesions- none, excoriation- none Lymphadenopathy- none Head- atraumatic            Eyes- Gross vision intact, PERRLA, conjunctivae and secretions clear            Ears- Hearing, canals-normal            Nose- Clear, no-Septal dev, mucus, polyps, erosion, perforation             Throat- Mallampati IV , mucosa clear , drainage- none, tonsils- atrophic Neck- flexible , trachea midline, no stridor , thyroid nl, carotid no bruit Chest - symmetrical excursion , unlabored           Heart/CV- RRR , no murmur , no gallop  , no rub, nl s1 s2                           - JVD- none , edema- none, stasis changes- none, varices- none           Lung- clear to P&A, wheeze- none, cough- none , dullness-none, rub- none           Chest wall-  Abd-  Br/ Gen/ Rectal- Not done, not indicated Extrem- cyanosis- none, clubbing, none, atrophy- none, strength- nl Neuro- grossly intact to observation

## 2015-02-08 DIAGNOSIS — G4733 Obstructive sleep apnea (adult) (pediatric): Secondary | ICD-10-CM | POA: Diagnosis not present

## 2015-02-10 ENCOUNTER — Other Ambulatory Visit: Payer: Self-pay | Admitting: *Deleted

## 2015-02-10 DIAGNOSIS — G4733 Obstructive sleep apnea (adult) (pediatric): Secondary | ICD-10-CM | POA: Diagnosis not present

## 2015-02-22 ENCOUNTER — Ambulatory Visit (INDEPENDENT_AMBULATORY_CARE_PROVIDER_SITE_OTHER): Payer: Medicare Other | Admitting: Internal Medicine

## 2015-02-22 ENCOUNTER — Encounter: Payer: Self-pay | Admitting: Internal Medicine

## 2015-02-22 VITALS — BP 124/70 | HR 76 | Ht 68.0 in | Wt 221.0 lb

## 2015-02-22 DIAGNOSIS — G4733 Obstructive sleep apnea (adult) (pediatric): Secondary | ICD-10-CM | POA: Diagnosis not present

## 2015-02-22 DIAGNOSIS — M542 Cervicalgia: Secondary | ICD-10-CM | POA: Diagnosis not present

## 2015-02-22 DIAGNOSIS — G8929 Other chronic pain: Secondary | ICD-10-CM

## 2015-02-22 NOTE — Patient Instructions (Signed)
I have recommended that we try CPAP for your severe obstructive sleep apnea. Second choice would be to look at oral appliance/ mouthpiece. You would like to talk with Dr Tamala Julian about pillows and ways to make your neck issues less of the problem. If that doesn't work out and you want to try something else, please let us know.

## 2015-02-22 NOTE — Progress Notes (Signed)
02/07/2015-75 year old male never smoker referred courtesy of Dr Linna Darner; snoring-has been told by others that he snores; never woken up gasping for air. No Sleep Study. Lives alone, no witnesses. Told of snoring by past travel companions. No ENT surgery. Past nasal fracture. He usually sleeps well. No significant daytime sleepiness. No caffeine. No sleep aids Sleep quality has been affected some by degenerative disc disease in cervical spine. Medical management for hypertension area minimal seasonal nasal congestion. Lives with 6 cats. Epworth 2/24  02/22/2015-75 year old male never smoker followed for snoring/[facility] Unattended Home Sleep Test 02/08/2015-[facility]I 53.8 per hour, desaturation to 75%, body weight 222 pounds.  We reviewed his sleep study documenting severe obstructive sleep apnea, and discussed treatment options. He does not feel sleepiness and lives alone with no feedback about his snoring. He thinks his degenerative cervical disc disease and associated cervical kyphosis compresses throat. He hopes that an appropriate pillow might relieve issues of neck position and resolve sleep apnea without other intervention.   ROS-see HPI   Negative unless "+" Constitutional:    weight loss, night sweats, fevers, chills, fatigue, lassitude. HEENT:    headaches, difficulty swallowing, tooth/dental problems, sore throat,       sneezing, itching, ear ache, + nasal congestion, post nasal drip, snoring CV:    chest pain, orthopnea, PND, swelling in lower extremities, anasarca,                                                     dizziness, palpitations Resp:   shortness of breath with exertion or at rest.                productive cough,   non-productive cough, coughing up of blood.              change in color of mucus.  wheezing.   Skin:    rash or lesions. GI:  No-   heartburn, indigestion, abdominal pain, nausea, vomiting, diarrhea,                 change in bowel habits, loss of appetite GU:  dysuria, change in color of urine, no urgency or frequency.   flank pain. MS:   joint pain, stiffness, decreased range of motion, back pain. Neuro-     nothing unusual Psych:  change in mood or affect.  depression or anxiety.   memory loss.  OBJ- Physical Exam General- Alert, Oriented, Affect-appropriate, Distress- none acute, + mild overweight Skin- rash-none, lesions- none, excoriation- none Lymphadenopathy- none Head- atraumatic            Eyes- Gross vision intact, PERRLA, conjunctivae and secretions clear            Ears- Hearing, canals-normal            Nose- Clear, no-Septal dev, mucus, polyps, erosion, perforation             Throat- Mallampati IV , mucosa clear , drainage- none, tonsils- atrophic Neck- flexible , trachea midline, no stridor , thyroid nl, carotid no bruit Chest - symmetrical excursion , unlabored           Heart/CV- RRR , no murmur , no gallop  , no rub, nl s1 s2                           -  JVD- none , edema- none, stasis changes- none, varices- none           Lung- clear to P&A, wheeze- none, cough- none , dullness-none, rub- none           Chest wall-  Abd-  Br/ Gen/ Rectal- Not done, not indicated Extrem- cyanosis- none, clubbing, none, atrophy- none, strength- nl Neuro- grossly intact to observation

## 2015-02-22 NOTE — Assessment & Plan Note (Signed)
Severe obstructive sleep apnea. It is harder to convince him that treatment is a benefit because he does not appreciate snoring and does not feel daytime sleepiness. Plan-he is going to try manipulating his neck position with specialty pillow. I suspect that won't be sufficient, but he must make his decision based on the information I was able to share with him.

## 2015-02-22 NOTE — Assessment & Plan Note (Signed)
Degenerative disc disease. We discussed possible impact of cervical kyphosis on upper airway obstruction and his sleep apnea.

## 2015-02-23 ENCOUNTER — Encounter: Payer: Self-pay | Admitting: Family Medicine

## 2015-02-23 ENCOUNTER — Ambulatory Visit (INDEPENDENT_AMBULATORY_CARE_PROVIDER_SITE_OTHER): Payer: Medicare Other | Admitting: Family Medicine

## 2015-02-23 VITALS — BP 126/78 | HR 76 | Ht 68.0 in | Wt 220.0 lb

## 2015-02-23 DIAGNOSIS — M47812 Spondylosis without myelopathy or radiculopathy, cervical region: Secondary | ICD-10-CM

## 2015-02-23 DIAGNOSIS — M4722 Other spondylosis with radiculopathy, cervical region: Secondary | ICD-10-CM

## 2015-02-23 NOTE — Assessment & Plan Note (Signed)
Patient does have severe degenerative disc disease at multiple levels in the cervical spine. I do think the patient will have exacerbations from time to time. Continues to take the gabapentin nightly. Patient has plenty of medication at this time and we will refill is necessary. Patient has tramadol for breakthrough pain. Encourage him to continue home exercises. We did discuss the gradual changing the elbow over the course of time that I think will be beneficial. We discussed icing. Patient given a trial topical anti-inflammatories. Patient will come back and see me again weeks for further evaluation and treatment. No signs of any radicular symptoms or any weakness of the upper extruded knees.  Spent  25 minutes with patient face-to-face and had greater than 50% of counseling including as described above in assessment and plan.

## 2015-02-23 NOTE — Progress Notes (Signed)
Pre visit review using our clinic review tool, if applicable. No additional management support is needed unless otherwise documented below in the visit note. 

## 2015-02-23 NOTE — Patient Instructions (Signed)
Always good to see you  Over rall you are doing great  I would not change much up at this time.  Stay active  OK to try to decline pillow over the course of time.  I would only change it though every 1-2 weeks.  If worsening pain ever then take a step back  Ice when you need it Send me a message in 2-3 weeks so e I know you are making progress.  See me when you need me otherwise.

## 2015-02-23 NOTE — Progress Notes (Signed)
[name] [address] [place], [name] [address] Phone: [phone] Subjective:     CC: Left arm pain follow up  [id number] Todd Larson is a 75 y.o. male coming in with complaint of left upper arm pain. Patient continued to have upper arm pain. Patient has been seen for this previously. Patient did get somewhat better with formal physical therapy. Patient states though he was having a much worse time and was noticing some with the upper extremity. Patient was sent for an MRI and is here to discuss results the next options. Patient's MRI shows some mild to moderate central canal narrowing most notably at C5-C6 and does have moderate severe foraminal narrowing on the left of multiple different levels.   Patient is been seen for greater than a year. Patient states that he has made some adjustments and has noticed some improvement. Having some mild increasing discomfort though in the cervical spine. Patient states it seems to be more at night. Patient is wondering what adjustments he can make to his pillow. Has been recently diagnosed with more sleep apnea and thinks that the machine could be causing some of this discomfort. Patient is a back sleeper and notices some tightness in the neck. Patient continues to try to work on posture but states that he feels he sleeps with her head forward flexion. States that this seems to give him more the stiffness. Patient is looking to extend his neck but if he goes to's far he notices significant pain. Once a note there is any way to gradually do it.  Past Medical History  Diagnosis Date  . GERD (gastroesophageal reflux disease)   . PSA elevation     History of   . Diverticulosis of colon   . Bulging discs      thoracic spine   Past Surgical History  Procedure Laterality Date  . Tonsillectomy    . Colonoscopy  2008    3 internal hemorrhoids, diverticulosis;Dr.Jacobs  . Prostate biopsy      x 2, once abnormal, once  normal   Social History  Substance Use Topics  . Smoking status: Never Smoker   . Smokeless tobacco: Never Used  . Alcohol Use: 1.2 oz/week    2 Glasses of wine per week     Comment: social/ drinks wine(white)   Allergies  Allergen Reactions  . Amoxicillin-Pot Clavulanate     REACTION: diarrhea/night sweats   Family History  Problem Relation Age of Onset  . Lung cancer Maternal Grandfather   . Heart attack Paternal Uncle   . Prostate cancer Brother        Past medical history, social, surgical and family history all reviewed in electronic medical record.   Review of Systems: No headache, visual changes, nausea, vomiting, diarrhea, constipation, dizziness, abdominal pain, skin rash, fevers, chills, night sweats, weight loss, swollen lymph nodes, body aches, joint swelling, muscle aches, chest pain, shortness of breath, mood changes.   Objective Blood pressure 126/78, pulse 76, height 5\' 8"  (1.727 m), weight 220 lb (99.791 kg), SpO2 97 %.  General: No apparent distress alert and oriented x3 mood and affect normal, dressed appropriately.  HEENT: Pupils equal, extraocular movements intact  Respiratory: Patient's speak in full sentences and does not appear short of breath  Cardiovascular: No lower extremity edema, non tender, no erythema  Skin: Warm dry intact with no signs of infection or rash on extremities or on axial skeleton.  Abdomen: Soft nontender  Neuro:  Cranial nerves II through XII are intact, neurovascularly intact in all extremities with 2+ DTRs and 2+ pulses.  Lymph: No lymphadenopathy of posterior or anterior cervical chain or axillae bilaterally.  Gait normal with good balance and coordination.  MSK:  Non tender with full range of motion and good stability and symmetric strength and tone of shoulders, elbows, wrist, hip, knee and ankles bilaterally.  Neck: Inspection shows mild increased his lordosis of the cervical region and increased kyphosis of the thoracic  region. No palpable stepoffs. Negative Spurling's maneuver. Mild worsening range of motion lacking the last 10 of extension as well as the last 5 of rotation and 10 of side bending bilaterally.  Grip strength and sensation normal in bilateral hands Strength good C4 to T1 distribution, slight 4/5 strength of the C7 nerve root compared to the contralateral side no significant change from previous exam No sensory change to C4 to T1 Negative Hoffman sign bilaterally Reflexes normal Mild worsening of previous exam.      Impression and Recommendations:     This case required medical decision making of moderate complexity.

## 2015-03-24 DIAGNOSIS — N401 Enlarged prostate with lower urinary tract symptoms: Secondary | ICD-10-CM | POA: Diagnosis not present

## 2015-03-24 DIAGNOSIS — N138 Other obstructive and reflux uropathy: Secondary | ICD-10-CM | POA: Diagnosis not present

## 2015-03-24 DIAGNOSIS — R358 Other polyuria: Secondary | ICD-10-CM | POA: Diagnosis not present

## 2015-03-29 DIAGNOSIS — H10022 Other mucopurulent conjunctivitis, left eye: Secondary | ICD-10-CM | POA: Diagnosis not present

## 2015-04-05 ENCOUNTER — Ambulatory Visit (INDEPENDENT_AMBULATORY_CARE_PROVIDER_SITE_OTHER): Payer: Medicare Other | Admitting: Adult Health

## 2015-04-05 ENCOUNTER — Encounter: Payer: Self-pay | Admitting: Adult Health

## 2015-04-05 VITALS — BP 150/80 | Temp 98.5°F | Ht 68.0 in | Wt 222.5 lb

## 2015-04-05 DIAGNOSIS — I10 Essential (primary) hypertension: Secondary | ICD-10-CM | POA: Diagnosis not present

## 2015-04-05 DIAGNOSIS — Z7189 Other specified counseling: Secondary | ICD-10-CM | POA: Diagnosis not present

## 2015-04-05 DIAGNOSIS — Z7689 Persons encountering health services in other specified circumstances: Secondary | ICD-10-CM

## 2015-04-05 MED ORDER — LOSARTAN POTASSIUM 50 MG PO TABS: 50.0000 mg | ORAL_TABLET | Freq: Every day | ORAL | Status: DC

## 2015-04-05 NOTE — Patient Instructions (Signed)
It was great meeting you today!  I have sent in a new prescription for Losartan- you can stop the Metoprolol.   Follow up with me in two weeks for a recheck   If you need anything in the meantime, please let me know

## 2015-04-05 NOTE — Progress Notes (Signed)
Patient presents to clinic today to establish care. He is a pleasant caucasian male who  has a past medical history of GERD (gastroesophageal reflux disease); PSA elevation; Diverticulosis of colon; and Bulging discs.   His last physical was in 08/2014 with MD Linna Darner   Acute Concerns: Establish Care  Chronic Issues: Hypertension  - Is currently on Metorpolol 25 mg BID. He feels as though it is making him more sleepy. He has been on Losartan in the past and would like to try and go back on it.  Health Maintenance: Dental -- Twice yearly  Vision --  [facility]- once a year Immunizations --UTD Colonoscopy --2008 - 10 year plan.  Diet:  Exercise:  Is followed by:  Urology  GI Sports Medicine - Dr. Thedore Mins.  Pulmonology - Dr. Annamaria Boots   Past Medical History  Diagnosis Date  . GERD (gastroesophageal reflux disease)   . PSA elevation     History of   . Diverticulosis of colon   . Bulging discs      thoracic spine    Past Surgical History  Procedure Laterality Date  . Tonsillectomy    . Colonoscopy  2008    3 internal hemorrhoids, diverticulosis;Dr.Jacobs  . Prostate biopsy      x 2, once abnormal, once normal    Current Outpatient Prescriptions on File Prior to Visit  Medication Sig Dispense Refill  . AMBULATORY NON FORMULARY MEDICATION Medication Name: Neck Traction Device, DX: Cervical Discomfort/irritation 1 each 0  . amitriptyline (ELAVIL) 25 MG tablet at bedtime as needed. 1/2 qhs prn for LBP    . cyclobenzaprine (FLEXERIL) 5 MG tablet Take 1 tablet (5 mg total) by mouth as directed. 1 by mouth twice daily and 1-2 at bedtime if needed 30 tablet 20  . finasteride (PROSCAR) 5 MG tablet Take 5 mg by mouth daily.    . fluticasone (FLONASE) 50 MCG/ACT nasal spray Place 1 spray into both nostrils 2 (two) times daily as needed for rhinitis. 16 g 11  . gabapentin (NEURONTIN) 100 MG capsule Take 1 capsule (100 mg total) by mouth 3 (three) times daily. (Patient taking  differently: Take 100 mg by mouth 3 (three) times daily as needed. ) 90 capsule 3  . hyoscyamine (ANASPAZ) 0.125 MG TBDP disintergrating tablet Place 1 tablet (0.125 mg total) under the tongue every 4 (four) hours as needed. 30 tablet 1  . meloxicam (MOBIC) 7.5 MG tablet 1 tablet twice daily as needed.    . metoprolol tartrate (LOPRESSOR) 25 MG tablet Take 1 tablet (25 mg total) by mouth 2 (two) times daily. 60 tablet 5  . montelukast (SINGULAIR) 10 MG tablet Take 1 tablet (10 mg total) by mouth at bedtime. (Patient taking differently: Take 10 mg by mouth as needed. ) 30 tablet 3  . traMADol (ULTRAM) 50 MG tablet TAKE 1 TABLET BY MOUTH EVERY 6 HOURS AS NEEDED 30 tablet 1   No current facility-administered medications on file prior to visit.    Allergies  Allergen Reactions  . Amoxicillin-Pot Clavulanate     REACTION: diarrhea/night sweats    Family History  Problem Relation Age of Onset  . Lung cancer Maternal Grandfather   . Heart attack Paternal Uncle   . Prostate cancer Brother     Social History   Social History  . Marital Status: Single    Spouse Name: N/A  . Number of Children: 0  . Years of Education: N/A   Occupational History  .  retired    Social History Main Topics  . Smoking status: Never Smoker   . Smokeless tobacco: Never Used  . Alcohol Use: 1.2 oz/week    2 Glasses of wine per week     Comment: social/ drinks wine(white)  . Drug Use: No  . Sexual Activity: Not on file   Other Topics Concern  . Not on file   Social History Narrative    Review of Systems  Constitutional: Positive for malaise/fatigue.  HENT: Negative.   Eyes: Negative.   Respiratory: Negative.   Cardiovascular: Negative.   Gastrointestinal: Negative.   Musculoskeletal: Negative.   Skin: Negative.   Endo/Heme/Allergies: Negative.   Psychiatric/Behavioral: Negative.     BP 150/80 mmHg  Temp(Src) 98.5 F (36.9 C) (Oral)  Ht 5\' 8"  (1.727 m)  Wt 222 lb 8 oz (100.925 kg)  BMI  33.84 kg/m2  Physical Exam  Constitutional: He is well-developed, well-nourished, and in no distress. No distress.  HENT:  Head: Normocephalic and atraumatic.  Right Ear: External ear normal.  Left Ear: External ear normal.  Nose: Nose normal.  Mouth/Throat: Oropharynx is clear and moist. No oropharyngeal exudate.  Cardiovascular: Normal rate, regular rhythm, normal heart sounds and intact distal pulses.  Exam reveals no gallop and no friction rub.   No murmur heard. Pulmonary/Chest: Effort normal and breath sounds normal. No respiratory distress. He has no wheezes. He has no rales. He exhibits no tenderness.  Skin: Skin is warm and dry. No rash noted. He is not diaphoretic. No erythema. No pallor.  Psychiatric: Mood, memory, affect and judgment normal.  Nursing note and vitals reviewed.   Assessment/Plan:  1. Encounter to establish care - Follow up in August for your next physical.   2. Essential hypertension - losartan (COZAAR) 50 MG tablet; Take 1 tablet (50 mg total) by mouth daily.  Dispense: 90 tablet; Refill: 3 - Follow up in two weeks  Dorothyann Peng, NP \

## 2015-04-19 ENCOUNTER — Ambulatory Visit (INDEPENDENT_AMBULATORY_CARE_PROVIDER_SITE_OTHER): Payer: Medicare Other | Admitting: Adult Health

## 2015-04-19 VITALS — BP 142/78 | Temp 98.4°F | Wt 223.3 lb

## 2015-04-19 DIAGNOSIS — I10 Essential (primary) hypertension: Secondary | ICD-10-CM

## 2015-04-19 NOTE — Progress Notes (Signed)
Subjective:    Patient ID: Todd Larson, male    DOB: [birth year], 75 y.o.   MRN: [id number]  HPI   75 year old male who presents to the office today for two week follow up regarding hypertension. When I last saw him we change his blood pressure medication from Metoprolol 25 mg to Cozaar 50 mg due to side effects of Metoprolol.   Today in the office he reports that he has not had any difference between the readings as in the past. He BP has been at [id number] systolic at home.   He reports that he has no issues with side effects.   BP Readings from Last 3 Encounters:  04/19/15 142/78  04/05/15 150/80  02/23/15 126/78      Review of Systems  Constitutional: Negative.   Respiratory: Negative.   Neurological: Negative.   Psychiatric/Behavioral: Negative.   All other systems reviewed and are negative.  Past Medical History  Diagnosis Date  . GERD (gastroesophageal reflux disease)   . PSA elevation     History of   . Diverticulosis of colon   . Bulging discs      thoracic spine  . Hypertension   . Inguinal hernia   . Hyperlipidemia   . OSA (obstructive sleep apnea)   . Recurrent sinus infections     Social History   Social History  . Marital Status: Single    Spouse Name: N/A  . Number of Children: 0  . Years of Education: N/A   Occupational History  . retired    Social History Main Topics  . Smoking status: Never Smoker   . Smokeless tobacco: Never Used  . Alcohol Use: 1.2 oz/week    2 Glasses of wine per week     Comment: social/ drinks wine(white)  . Drug Use: No  . Sexual Activity: Not on file   Other Topics Concern  . 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           Past Surgical History  Procedure Laterality Date  . Tonsillectomy    . Colonoscopy  2008    3 internal hemorrhoids, diverticulosis;Dr.Jacobs  . Prostate biopsy      x 2, once abnormal,  once normal    Family History  Problem Relation Age of Onset  . Lung cancer Maternal Grandfather     enviromental   . Heart attack Paternal Uncle   . Prostate cancer Brother     Allergies  Allergen Reactions  . Amoxicillin-Pot Clavulanate     REACTION: diarrhea/night sweats    Current Outpatient Prescriptions on File Prior to Visit  Medication Sig Dispense Refill  . AMBULATORY NON FORMULARY MEDICATION Medication Name: Neck Traction Device, DX: Cervical Discomfort/irritation 1 each 0  . amitriptyline (ELAVIL) 25 MG tablet at bedtime as needed. 1/2 qhs prn for LBP    . cyclobenzaprine (FLEXERIL) 5 MG tablet Take 1 tablet (5 mg total) by mouth as directed. 1 by mouth twice daily and 1-2 at bedtime if needed 30 tablet 20  . finasteride (PROSCAR) 5 MG tablet Take 5 mg by mouth daily.    . fluticasone (FLONASE) 50 MCG/ACT nasal spray Place 1 spray into both nostrils 2 (two) times daily as needed for rhinitis. 16 g 11  . gabapentin (NEURONTIN) 100 MG capsule Take 1  capsule (100 mg total) by mouth 3 (three) times daily. (Patient taking differently: Take 100 mg by mouth 3 (three) times daily as needed. ) 90 capsule 3  . hyoscyamine (ANASPAZ) 0.125 MG TBDP disintergrating tablet Place 1 tablet (0.125 mg total) under the tongue every 4 (four) hours as needed. 30 tablet 1  . losartan (COZAAR) 50 MG tablet Take 1 tablet (50 mg total) by mouth daily. 90 tablet 3  . meloxicam (MOBIC) 7.5 MG tablet 1 tablet twice daily as needed.    . montelukast (SINGULAIR) 10 MG tablet Take 1 tablet (10 mg total) by mouth at bedtime. (Patient taking differently: Take 10 mg by mouth as needed. ) 30 tablet 3  . traMADol (ULTRAM) 50 MG tablet TAKE 1 TABLET BY MOUTH EVERY 6 HOURS AS NEEDED 30 tablet 1   No current facility-administered medications on file prior to visit.    BP 142/78 mmHg  Temp(Src) 98.4 F (36.9 C) (Oral)  Wt 223 lb 4.8 oz (101.288 kg)       Objective:   Physical Exam  Constitutional: He  is oriented to person, place, and time. He appears well-developed and well-nourished. No distress.  Cardiovascular: Normal rate, regular rhythm, normal heart sounds and intact distal pulses.  Exam reveals no gallop and no friction rub.   No murmur heard. Pulmonary/Chest: Effort normal and breath sounds normal. No respiratory distress. He has no wheezes. He has no rales. He exhibits no tenderness.  Neurological: He is alert and oriented to person, place, and time.  Skin: Skin is warm and dry. No rash noted. He is not diaphoretic. No erythema. No pallor.  Psychiatric: He has a normal mood and affect. His behavior is normal. Judgment and thought content normal.  Nursing note and vitals reviewed.     Assessment & Plan:  1. Essential hypertension - Not at goal. I am going to have him take 100mg  Cozaar and follow up with me in two weeks with current blood pressure readings.  - Will consider adding HCTZ at that time if not at goal.   Dorothyann Peng, NP

## 2015-05-05 ENCOUNTER — Ambulatory Visit (INDEPENDENT_AMBULATORY_CARE_PROVIDER_SITE_OTHER): Payer: Medicare Other | Admitting: Adult Health

## 2015-05-05 ENCOUNTER — Ambulatory Visit (INDEPENDENT_AMBULATORY_CARE_PROVIDER_SITE_OTHER)
Admission: RE | Admit: 2015-05-05 | Discharge: 2015-05-05 | Disposition: A | Discharge status: Home / Self Care | Payer: Medicare Other | Source: Ambulatory Visit | Attending: Adult Health | Admitting: Adult Health

## 2015-05-05 VITALS — BP 150/82 | Temp 98.6°F | Wt 222.6 lb

## 2015-05-05 DIAGNOSIS — M25561 Pain in right knee: Secondary | ICD-10-CM | POA: Diagnosis not present

## 2015-05-05 DIAGNOSIS — I10 Essential (primary) hypertension: Secondary | ICD-10-CM

## 2015-05-05 MED ORDER — HYDROCHLOROTHIAZIDE 25 MG PO TABS: 25.0000 mg | ORAL_TABLET | Freq: Every day | ORAL | Status: DC

## 2015-05-05 NOTE — Progress Notes (Signed)
Subjective:    Patient ID: CRISTOFER RATHGEBER, male    DOB: 1940/08/02, 75 y.o.   MRN: [id number]  HPI  75 year old male who presents to the office today for right knee pain. He reports that in January he fell and hit his right knee on the side walk. His pain resolved. In February he tripped going up some stairs and hit his right knee on a step. The pain continues to be present. Feels as though at time he has a " popping sensation in his knee. Pain is worse in the morning.   Sore around the lower medial aspect of the right knee. No swelling, no redness, no bruising.   He also reports that his blood pressure continues to be elevated at home. He is currently on 100mg  Cozaar. Blood pressures continue to be in the 150/90's. He denies any headaches or blurred vision.     Review of Systems  Constitutional: Negative.   Musculoskeletal: Positive for myalgias and arthralgias.  Skin: Negative.   All other systems reviewed and are negative.  Past Medical History  Diagnosis Date  . GERD (gastroesophageal reflux disease)   . PSA elevation     History of   . Diverticulosis of colon   . Bulging discs      thoracic spine  . Hypertension   . Inguinal hernia   . Hyperlipidemia   . OSA (obstructive sleep apnea)   . Recurrent sinus infections     Social History   Social History  . Marital Status: Single    Spouse Name: N/A  . Number of Children: 0  . Years of Education: N/A   Occupational History  . retired    Social History Main Topics  . Smoking status: Never Smoker   . Smokeless tobacco: Never Used  . Alcohol Use: 1.2 oz/week    2 Glasses of wine per week     Comment: social/ drinks wine(white)  . Drug Use: No  . Sexual Activity: Not on file   Other Topics Concern  . 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           Past Surgical History  Procedure Laterality Date  .  Tonsillectomy    . Colonoscopy  2008    3 internal hemorrhoids, diverticulosis;Dr.Jacobs  . Prostate biopsy      x 2, once abnormal, once normal    Family History  Problem Relation Age of Onset  . Lung cancer Maternal Grandfather     enviromental   . Heart attack Paternal Uncle   . Prostate cancer Brother     Allergies  Allergen Reactions  . Amoxicillin-Pot Clavulanate     REACTION: diarrhea/night sweats    Current Outpatient Prescriptions on File Prior to Visit  Medication Sig Dispense Refill  . AMBULATORY NON FORMULARY MEDICATION Medication Name: Neck Traction Device, DX: Cervical Discomfort/irritation 1 each 0  . amitriptyline (ELAVIL) 25 MG tablet at bedtime as needed. 1/2 qhs prn for LBP    . cyclobenzaprine (FLEXERIL) 5 MG tablet Take 1 tablet (5 mg total) by mouth as directed. 1 by mouth twice daily and 1-2 at bedtime if needed 30 tablet 20  . finasteride (PROSCAR) 5 MG tablet Take 5 mg by mouth daily.    . fluticasone (FLONASE) 50 MCG/ACT nasal spray Place 1  spray into both nostrils 2 (two) times daily as needed for rhinitis. 16 g 11  . gabapentin (NEURONTIN) 100 MG capsule Take 1 capsule (100 mg total) by mouth 3 (three) times daily. (Patient taking differently: Take 100 mg by mouth 3 (three) times daily as needed. ) 90 capsule 3  . hyoscyamine (ANASPAZ) 0.125 MG TBDP disintergrating tablet Place 1 tablet (0.125 mg total) under the tongue every 4 (four) hours as needed. 30 tablet 1  . losartan (COZAAR) 50 MG tablet Take 1 tablet (50 mg total) by mouth daily. 90 tablet 3  . meloxicam (MOBIC) 7.5 MG tablet 1 tablet twice daily as needed.    . montelukast (SINGULAIR) 10 MG tablet Take 1 tablet (10 mg total) by mouth at bedtime. (Patient taking differently: Take 10 mg by mouth as needed. ) 30 tablet 3  . traMADol (ULTRAM) 50 MG tablet TAKE 1 TABLET BY MOUTH EVERY 6 HOURS AS NEEDED 30 tablet 1   No current facility-administered medications on file prior to visit.    BP  150/82 mmHg  Temp(Src) 98.6 F (37 C) (Oral)  Wt 222 lb 9.6 oz (100.971 kg)       Objective:   Physical Exam  Constitutional: He is oriented to person, place, and time. He appears well-developed and well-nourished. No distress.  Cardiovascular: Normal rate, regular rhythm and intact distal pulses.  Exam reveals no gallop and no friction rub.   No murmur heard. Pulmonary/Chest: Effort normal and breath sounds normal.  Musculoskeletal: Normal range of motion. He exhibits tenderness. He exhibits no edema.  Neurological: He is alert and oriented to person, place, and time.  Skin: Skin is warm and dry. No rash noted. He is not diaphoretic. No erythema. No pallor.  Psychiatric: He has a normal mood and affect. His behavior is normal. Judgment and thought content normal.  Nursing note and vitals reviewed.     Assessment & Plan:  1. Right knee pain - Likely arthritic pain. Does not appear to be an effusion  - DG Knee 1-2 Views Right; Future - Consider steroid injection.  2. Essential hypertension - BMP normal  - hydrochlorothiazide (HYDRODIURIL) 25 MG tablet; Take 1 tablet (25 mg total) by mouth daily.  Dispense: 30 tablet; Refill: 3 - Follow up in 2 weeks.  - Bring BP log to appointment.   Dorothyann Peng, NP

## 2015-05-05 NOTE — Patient Instructions (Signed)
It was great seeing you again.   I have sent in a prescription for HCTZ 25mg . Take this daily for your blood pressure.   Please follow up in 2 weeks  I will follow up with you regarding your x ray   If you need anything in the meantime, please let me know.

## 2015-05-13 ENCOUNTER — Encounter: Payer: Self-pay | Admitting: Adult Health

## 2015-05-17 ENCOUNTER — Ambulatory Visit (INDEPENDENT_AMBULATORY_CARE_PROVIDER_SITE_OTHER): Payer: Medicare Other | Admitting: Adult Health

## 2015-05-17 VITALS — BP 122/50 | Temp 98.7°F | Wt 220.1 lb

## 2015-05-17 DIAGNOSIS — M25561 Pain in right knee: Secondary | ICD-10-CM

## 2015-05-17 NOTE — Progress Notes (Deleted)
Was buckling every morning, but this has since resolved. Pain with pressure  Sports Medicine

## 2015-05-17 NOTE — Progress Notes (Signed)
Subjective:    Patient ID: Todd Larson, male    DOB: 1940-01-06, 75 y.o.   MRN: [id number]  HPI 75 year old male who returns to the clinic today for follow up regarding his right knee pain. I last saw him on 05/05/2015 at which time he reported  that in January he fell and hit his right knee on the side walk. His pain resolved. In February he tripped going up some stairs and hit his right knee on a step. The pain continues to be present. Feels as though at time he has a " popping sensation in his knee. Pain is worse in the morning.   Over the last two weeks he has had a feeling of " it was going to buckle". That feeling has since resolved. Currently he has a "soreness" around the infra patellar tendon and lower LCL. No midline joint tenderness.   X ray of right knee on 05/05/2015 showed a " normal right knee"  He has been wearing a knee brace but does not find any therapeutic qualities to wearing this.    Review of Systems  Constitutional: Positive for activity change.  Musculoskeletal: Positive for arthralgias and gait problem. Negative for back pain and joint swelling.   Past Medical History  Diagnosis Date  . GERD (gastroesophageal reflux disease)   . PSA elevation     History of   . Diverticulosis of colon   . Bulging discs      thoracic spine  . Hypertension   . Inguinal hernia   . Hyperlipidemia   . OSA (obstructive sleep apnea)   . Recurrent sinus infections     Social History   Social History  . Marital Status: Single    Spouse Name: N/A  . Number of Children: 0  . Years of Education: N/A   Occupational History  . retired    Social History Main Topics  . Smoking status: Never Smoker   . Smokeless tobacco: Never Used  . Alcohol Use: 1.2 oz/week    2 Glasses of wine per week     Comment: social/ drinks wine(white)  . Drug Use: No  . Sexual Activity: Not on file   Other Topics Concern  . 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           Past Surgical History  Procedure Laterality Date  . Tonsillectomy    . Colonoscopy  2008    3 internal hemorrhoids, diverticulosis;Dr.Jacobs  . Prostate biopsy      x 2, once abnormal, once normal    Family History  Problem Relation Age of Onset  . Lung cancer Maternal Grandfather     enviromental   . Heart attack Paternal Uncle   . Prostate cancer Brother     Allergies  Allergen Reactions  . Amoxicillin-Pot Clavulanate     REACTION: diarrhea/night sweats    Current Outpatient Prescriptions on File Prior to Visit  Medication Sig Dispense Refill  . AMBULATORY NON FORMULARY MEDICATION Medication Name: Neck Traction Device, DX: Cervical Discomfort/irritation 1 each 0  . amitriptyline (ELAVIL) 25 MG tablet at bedtime as needed. 1/2 qhs prn for LBP    . cyclobenzaprine (FLEXERIL) 5 MG tablet Take 1 tablet (5 mg total) by mouth as directed. 1 by mouth twice daily and 1-2 at  bedtime if needed 30 tablet 20  . finasteride (PROSCAR) 5 MG tablet Take 5 mg by mouth daily.    . fluticasone (FLONASE) 50 MCG/ACT nasal spray Place 1 spray into both nostrils 2 (two) times daily as needed for rhinitis. 16 g 11  . gabapentin (NEURONTIN) 100 MG capsule Take 1 capsule (100 mg total) by mouth 3 (three) times daily. (Patient taking differently: Take 100 mg by mouth 3 (three) times daily as needed. ) 90 capsule 3  . hydrochlorothiazide (HYDRODIURIL) 25 MG tablet Take 1 tablet (25 mg total) by mouth daily. 30 tablet 3  . hyoscyamine (ANASPAZ) 0.125 MG TBDP disintergrating tablet Place 1 tablet (0.125 mg total) under the tongue every 4 (four) hours as needed. 30 tablet 1  . losartan (COZAAR) 50 MG tablet Take 1 tablet (50 mg total) by mouth daily. 90 tablet 3  . meloxicam (MOBIC) 7.5 MG tablet 1 tablet twice daily as needed.    . montelukast (SINGULAIR) 10 MG tablet Take 1 tablet (10 mg total) by mouth at bedtime. (Patient  taking differently: Take 10 mg by mouth as needed. ) 30 tablet 3  . traMADol (ULTRAM) 50 MG tablet TAKE 1 TABLET BY MOUTH EVERY 6 HOURS AS NEEDED 30 tablet 1   No current facility-administered medications on file prior to visit.    BP 122/50 mmHg  Temp(Src) 98.7 F (37.1 C) (Oral)  Wt 220 lb 1.6 oz (99.837 kg)       Objective:   Physical Exam  Constitutional: He is oriented to person, place, and time. He appears well-developed and well-nourished. No distress.  Cardiovascular: Normal heart sounds.   Musculoskeletal: Normal range of motion. He exhibits tenderness. He exhibits no edema.       Right knee: He exhibits normal range of motion, no swelling, no effusion, no ecchymosis, no deformity, normal alignment, no LCL laxity, normal patellar mobility, no bony tenderness and no MCL laxity. Tenderness found. LCL and patellar tendon tenderness noted. No medial joint line and no lateral joint line tenderness noted.  Anterior drawer and lachmans test negative. Had no increased pain with internal rotation. Slight discomfort with knee to chest and external rotation  Neurological: He is alert and oriented to person, place, and time.  Skin: Skin is warm and dry. No rash noted. He is not diaphoretic. No erythema. No pallor.  Psychiatric: He has a normal mood and affect. His behavior is normal. Judgment and thought content normal.  Nursing note and vitals reviewed.     Assessment & Plan:  1. Right knee pain - doubt LCL, MCL, or meniscus tear but needs to be ruled out.  - Doubt bursitis.  - Ambulatory referral to Sports Medicine - No strenuous activity - Ice and Nsaids - Follow up as needed  Dorothyann Peng, NP

## 2015-05-17 NOTE — Patient Instructions (Signed)
It was great seeing you again.   We will get you over to sports medicine so that they can more thoroughly evaluate your knee pain.   Continue with daily activities but take it easy.   Tylenol/Motrin and Ice as needed

## 2015-05-19 ENCOUNTER — Ambulatory Visit: Payer: Medicare Other | Admitting: Adult Health

## 2015-06-01 ENCOUNTER — Ambulatory Visit (INDEPENDENT_AMBULATORY_CARE_PROVIDER_SITE_OTHER): Payer: Medicare Other | Admitting: Family Medicine

## 2015-06-01 ENCOUNTER — Encounter: Payer: Self-pay | Admitting: Family Medicine

## 2015-06-01 ENCOUNTER — Other Ambulatory Visit: Payer: Self-pay

## 2015-06-01 VITALS — BP 122/72 | HR 92 | Ht 68.0 in | Wt 222.0 lb

## 2015-06-01 DIAGNOSIS — M25561 Pain in right knee: Secondary | ICD-10-CM

## 2015-06-01 DIAGNOSIS — S83206A Unspecified tear of unspecified meniscus, current injury, right knee, initial encounter: Secondary | ICD-10-CM | POA: Diagnosis not present

## 2015-06-01 NOTE — Progress Notes (Signed)
[name] [address], [place] [address] Phone: [phone] Subjective:    I'm seeing this patient by the request  of:    CC: Right knee pain   [id number] Todd Larson is a 75 y.o. male coming in with complaint of right knee pain. Patient describes the pain as a dull, throbbing aching sensation. Patient did have 2 falls that were very close in succession in the months of February and March. Since then he had been doing relatively well until the last couple weeks. Started having pain on the lateral aspect of his knee. Worse with twisting motion such as getting in or out of the car. When he kneels on his contralateral knee and tries to get up he has severe pain as well. Describes it as a dull ache sometimes with a sharp stabbing pain with the movement. No radiation down the leg or any numbness or tingling. Sign of the provider and did have x-rays. X-rays were independently visualized by me showing no significant bony abnormality with very mild arthritic changes. Patient denies any swelling but states sometimes it is difficult to end his knee all the way. Denies any nighttime awakening but states that he feels stiff in the morning.     Past Medical History  Diagnosis Date  . GERD (gastroesophageal reflux disease)   . PSA elevation     History of   . Diverticulosis of colon   . Bulging discs      thoracic spine  . Hypertension   . Inguinal hernia   . Hyperlipidemia   . OSA (obstructive sleep apnea)   . Recurrent sinus infections    Past Surgical History  Procedure Laterality Date  . Tonsillectomy    . Colonoscopy  2008    3 internal hemorrhoids, diverticulosis;Dr.Jacobs  . Prostate biopsy      x 2, once abnormal, once normal   Social History   Social History  . Marital Status: Single    Spouse Name: N/A  . Number of Children: 0  . Years of Education: N/A   Occupational History  . retired    Social History Main Topics  . Smoking  status: Never Smoker   . Smokeless tobacco: Never Used  . Alcohol Use: 1.2 oz/week    2 Glasses of wine per week     Comment: social/ drinks wine(white)  . Drug Use: No  . Sexual Activity: Not Asked   Other Topics Concern  . 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          Allergies  Allergen Reactions  . Amoxicillin-Pot Clavulanate     REACTION: diarrhea/night sweats   Family History  Problem Relation Age of Onset  . Lung cancer Maternal Grandfather     enviromental   . Heart attack Paternal Uncle   . Prostate cancer Brother     Past medical history, social, surgical and family history all reviewed in electronic medical record.  No pertanent information unless stated regarding to the chief complaint.   Review of Systems: No headache, visual changes, nausea, vomiting, diarrhea, constipation, dizziness, abdominal pain, skin rash, fevers, chills, night sweats, weight loss, swollen lymph nodes, body aches, joint swelling, muscle aches, chest pain, shortness of breath, mood changes.   Objective Blood pressure 122/72, pulse 92, height 5\' 8"  (  1.727 m), weight 222 lb (100.699 kg), SpO2 97 %.  General: No apparent distress alert and oriented x3 mood and affect normal, dressed appropriately.  HEENT: Pupils equal, extraocular movements intact  Respiratory: Patient's speak in full sentences and does not appear short of breath  Cardiovascular: No lower extremity edema, non tender, no erythema  Skin: Warm dry intact with no signs of infection or rash on extremities or on axial skeleton.  Abdomen: Soft nontender  Neuro: Cranial nerves II through XII are intact, neurovascularly intact in all extremities with 2+ DTRs and 2+ pulses.  Lymph: No lymphadenopathy of posterior or anterior cervical chain or axillae bilaterally.  Gait normal with good balance and coordination.  MSK:  Non tender with full  range of motion and good stability and symmetric strength and tone of shoulders, elbows, wrist, hip, and ankles bilaterally.  Knee: Right Normal to inspection with no erythema or effusion or obvious bony abnormalities. Tender to palpation over the lateral joint line ROM full in flexion and extension and lower leg rotation. Ligaments with solid consistent endpoints including ACL, PCL, LCL, MCL. Positive Mcmurray's, Apley's, and Thessalonian tests. Non painful patellar compression. Patellar glide with mild crepitus. Patellar and quadriceps tendons unremarkable. Hamstring and quadriceps strength is normal.  Contralateral knee unremarkable  After informed written and verbal consent, patient was seated on exam table. Right knee was prepped with alcohol swab and utilizing anterolateral approach, patient's right knee space was injected with 4:1  marcaine 0.5%: Kenalog 40mg /dL. Patient tolerated the procedure well without immediate complications.  Procedure note [id number]; 15 minutes spent for Therapeutic exercises as stated in above notes.  This included exercises focusing on stretching, strengthening, with significant focus on eccentric aspects.  Flexion and extension exercises working on the vastus medialis oblique as well as hamstring stretching. Hip abductor strengthening given as well. Proper technique shown and discussed handout in great detail with ATC.  All questions were discussed and answered.   Procedure note [id number]; 15 minutes spent for Therapeutic exercises as stated in above notes.  This included exercises focusing on stretching, strengthening, with significant focus on eccentric aspects.  Flexion and extension exercises working on hip abductor's as well as vastus medialis obliques strengthening. Hamstring stretches given Proper technique shown and discussed handout in great detail with ATC.  All questions were discussed and answered.      Impression and Recommendations:     This case  required medical decision making of moderate complexity.      Note: This dictation was prepared with Dragon dictation along with smaller phrase technology. Any transcriptional errors that result from this process are unintentional.

## 2015-06-01 NOTE — Progress Notes (Signed)
Pre visit review using our clinic review tool, if applicable. No additional management support is needed unless otherwise documented below in the visit note. 

## 2015-06-01 NOTE — Patient Instructions (Addendum)
Good to see you  Ice is your friend Ice 20 minutes 2 times daily. Usually after activity and before bed. Exercises 3 times a week.  We will hold on a brace at this time Keep wearing good shoes I would kneel on that knee.  See me again in 3 weeks. If not better we will consider physical therapy

## 2015-06-01 NOTE — Assessment & Plan Note (Signed)
Patient given injection today and tolerated the procedure well. We discussed icing regimen and home exercises. We discussed which activities to do in which ones to potentially avoid. I do not feel that bracing is necessary at the moment. If worsening symptoms I do think that possibly a custom brace secondary to mild arthritis and some mild instability that he states. Not found today on exam though. Patient will try some topical anti-inflammatory's. Worsening symptoms he'll come back sooner otherwise follow-up in 3 weeks. At that time we will likely ultrasound the knee and discussed possible formal physical therapy if no improvement.

## 2015-06-02 ENCOUNTER — Ambulatory Visit: Payer: Medicare Other | Admitting: Family Medicine

## 2015-06-02 ENCOUNTER — Encounter: Payer: Self-pay | Admitting: Family Medicine

## 2015-06-14 ENCOUNTER — Ambulatory Visit (INDEPENDENT_AMBULATORY_CARE_PROVIDER_SITE_OTHER): Payer: Medicare Other | Admitting: Adult Health

## 2015-06-14 ENCOUNTER — Other Ambulatory Visit: Payer: Self-pay | Admitting: Adult Health

## 2015-06-14 ENCOUNTER — Encounter: Payer: Self-pay | Admitting: Adult Health

## 2015-06-14 ENCOUNTER — Telehealth: Payer: Self-pay | Admitting: Adult Health

## 2015-06-14 VITALS — BP 140/70 | HR 87 | Temp 98.7°F | Ht 68.0 in | Wt 220.6 lb

## 2015-06-14 DIAGNOSIS — M62838 Other muscle spasm: Secondary | ICD-10-CM | POA: Diagnosis not present

## 2015-06-14 MED ORDER — CYCLOBENZAPRINE HCL 5 MG PO TABS: 5.0000 mg | ORAL_TABLET | Freq: Three times a day (TID) | ORAL | Status: DC | PRN

## 2015-06-14 MED ORDER — CYCLOBENZAPRINE HCL 5 MG PO TABS: 5.0000 mg | ORAL_TABLET | ORAL | Status: DC

## 2015-06-14 NOTE — Progress Notes (Signed)
Subjective:    Patient ID: Todd Larson, male    DOB: 10-09-1940, 75 y.o.   MRN: [id number]  Back Pain This is a new problem. The current episode started in the past 7 days. The problem occurs constantly. The problem has been gradually improving since onset. The pain is present in the sacro-iliac. The quality of the pain is described as cramping. The pain is moderate. The symptoms are aggravated by bending, standing, position and twisting.   He saw his chiropractor yesterday and he " did an adjustment to my lumbar spine". Todd Larson reports feeling better but has had muscle cramping in his sacral area.    Review of Systems  Constitutional: Negative.   Respiratory: Negative.   Cardiovascular: Negative.   Musculoskeletal: Positive for myalgias and back pain. Negative for joint swelling and gait problem.  Neurological: Negative.   Psychiatric/Behavioral: Negative.    Past Medical History  Diagnosis Date  . GERD (gastroesophageal reflux disease)   . PSA elevation     History of   . Diverticulosis of colon   . Bulging discs      thoracic spine  . Hypertension   . Inguinal hernia   . Hyperlipidemia   . OSA (obstructive sleep apnea)   . Recurrent sinus infections     Social History   Social History  . Marital Status: Single    Spouse Name: N/A  . Number of Children: 0  . Years of Education: N/A   Occupational History  . retired    Social History Main Topics  . Smoking status: Never Smoker   . Smokeless tobacco: Never Used  . Alcohol Use: 1.2 oz/week    2 Glasses of wine per week     Comment: social/ drinks wine(white)  . Drug Use: No  . Sexual Activity: Not on file   Other Topics Concern  . 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           Past Surgical History  Procedure Laterality Date  . Tonsillectomy    . Colonoscopy  2008    3 internal hemorrhoids,  diverticulosis;Dr.Jacobs  . Prostate biopsy      x 2, once abnormal, once normal    Family History  Problem Relation Age of Onset  . Lung cancer Maternal Grandfather     enviromental   . Heart attack Paternal Uncle   . Prostate cancer Brother     Allergies  Allergen Reactions  . Amoxicillin-Pot Clavulanate     REACTION: diarrhea/night sweats    Current Outpatient Prescriptions on File Prior to Visit  Medication Sig Dispense Refill  . AMBULATORY NON FORMULARY MEDICATION Medication Name: Neck Traction Device, DX: Cervical Discomfort/irritation 1 each 0  . amitriptyline (ELAVIL) 25 MG tablet at bedtime as needed. 1/2 qhs prn for LBP    . finasteride (PROSCAR) 5 MG tablet Take 5 mg by mouth daily.    . fluticasone (FLONASE) 50 MCG/ACT nasal spray Place 1 spray into both nostrils 2 (two) times daily as needed for rhinitis. 16 g 11  . gabapentin (NEURONTIN) 100 MG capsule Take 1 capsule (100 mg total) by mouth 3 (three) times daily. (Patient taking differently: Take 100 mg by mouth 3 (three) times daily as needed. ) 90 capsule 3  . hydrochlorothiazide (HYDRODIURIL) 25 MG tablet Take 1 tablet (  25 mg total) by mouth daily. 30 tablet 3  . hyoscyamine (ANASPAZ) 0.125 MG TBDP disintergrating tablet Place 1 tablet (0.125 mg total) under the tongue every 4 (four) hours as needed. 30 tablet 1  . losartan (COZAAR) 50 MG tablet Take 1 tablet (50 mg total) by mouth daily. 90 tablet 3  . meloxicam (MOBIC) 7.5 MG tablet 1 tablet twice daily as needed.    . montelukast (SINGULAIR) 10 MG tablet Take 1 tablet (10 mg total) by mouth at bedtime. (Patient taking differently: Take 10 mg by mouth as needed. ) 30 tablet 3  . traMADol (ULTRAM) 50 MG tablet TAKE 1 TABLET BY MOUTH EVERY 6 HOURS AS NEEDED 30 tablet 1   No current facility-administered medications on file prior to visit.    BP 140/70 mmHg  Pulse 87  Temp(Src) 98.7 F (37.1 C) (Oral)  Ht 5\' 8"  (1.727 m)  Wt 220 lb 9 oz (100.046 kg)  BMI  33.54 kg/m2  SpO2 98%       Objective:   Physical Exam  Constitutional: He is oriented to person, place, and time. He appears well-developed and well-nourished. No distress.  Cardiovascular: Normal rate, regular rhythm, normal heart sounds and intact distal pulses.  Exam reveals no gallop and no friction rub.   No murmur heard. Pulmonary/Chest: Effort normal and breath sounds normal. No respiratory distress. He has no wheezes. He has no rales.  Musculoskeletal: Normal range of motion. He exhibits no edema or tenderness.  Neurological: He is alert and oriented to person, place, and time.  Skin: Skin is warm and dry. No rash noted. He is not diaphoretic. No erythema. No pallor.  No bruising or discoloration  No signs of trauma   Psychiatric: He has a normal mood and affect. His behavior is normal. Judgment and thought content normal.  Vitals reviewed.      Assessment & Plan:  1. Muscle spasm - cyclobenzaprine (FLEXERIL) 5 MG tablet; Take 1 tablet (5 mg total) by mouth as directed. 1 by mouth twice daily and 1-2 at bedtime if needed  Dispense: 30 tablet; Refill: 6 - Motrin/Tylenol for symptom relief - Heating pad - Follow up if no improvement - Consider referral to ortho and/or x ray of lumbar/sacral spine.   Dorothyann Peng, NP

## 2015-06-14 NOTE — Patient Instructions (Addendum)
I have sent in a prescription for Flexeril. Use as directed.   Motrin/Tylenol for symptom relief.   Follow up as needed    Muscle Cramps and Spasms Muscle cramps and spasms occur when a muscle or muscles tighten and you have no control over this tightening (involuntary muscle contraction). They are a common problem and can develop in any muscle. The most common place is in the calf muscles of the leg. Both muscle cramps and muscle spasms are involuntary muscle contractions, but they also have differences:   Muscle cramps are sporadic and painful. They may last a few seconds to a quarter of an hour. Muscle cramps are often more forceful and last longer than muscle spasms.  Muscle spasms may or may not be painful. They may also last just a few seconds or much longer. CAUSES  It is uncommon for cramps or spasms to be due to a serious underlying problem. In many cases, the cause of cramps or spasms is unknown. Some common causes are:   Overexertion.   Overuse from repetitive motions (doing the same thing over and over).   Remaining in a certain position for a long period of time.   Improper preparation, form, or technique while performing a sport or activity.   Dehydration.   Injury.   Side effects of some medicines.   Abnormally low levels of the salts and ions in your blood (electrolytes), especially potassium and calcium. This could happen if you are taking water pills (diuretics) or you are pregnant.  Some underlying medical problems can make it more likely to develop cramps or spasms. These include, but are not limited to:   Diabetes.   Parkinson disease.   Hormone disorders, such as thyroid problems.   Alcohol abuse.   Diseases specific to muscles, joints, and bones.   Blood vessel disease where not enough blood is getting to the muscles.  HOME CARE INSTRUCTIONS   Stay well hydrated. Drink enough water and fluids to keep your urine clear or pale  yellow.  It may be helpful to massage, stretch, and relax the affected muscle.  For tight or tense muscles, use a warm towel, heating pad, or hot shower water directed to the affected area.  If you are sore or have pain after a cramp or spasm, applying ice to the affected area may relieve discomfort.  Put ice in a plastic bag.  Place a towel between your skin and the bag.  Leave the ice on for 15-20 minutes, 03-04 times a day.  Medicines used to treat a known cause of cramps or spasms may help reduce their frequency or severity. Only take over-the-counter or prescription medicines as directed by your caregiver. SEEK MEDICAL CARE IF:  Your cramps or spasms get more severe, more frequent, or do not improve over time.  MAKE SURE YOU:   Understand these instructions.  Will watch your condition.  Will get help right away if you are not doing well or get worse.   This information is not intended to replace advice given to you by your health care provider. Make sure you discuss any questions you have with your health care provider.   Document Released: 06/09/2001 Document Revised: 04/14/2012 Document Reviewed: 12/05/2011 Elsevier Interactive Patient Education [employer].

## 2015-06-14 NOTE — Progress Notes (Signed)
Pre visit review using our clinic review tool, if applicable. No additional management support is needed unless otherwise documented below in the visit note. 

## 2015-06-14 NOTE — Telephone Encounter (Signed)
[facility] call to say they need correct directions on the following med    cyclobenzaprine (FLEXERIL) 5 MG tablet

## 2015-06-15 NOTE — Telephone Encounter (Signed)
I called [facility] pharmacy and gave clarification on medication requested. Thanks.

## 2015-06-22 ENCOUNTER — Ambulatory Visit: Payer: Medicare Other | Admitting: Family Medicine

## 2015-06-22 ENCOUNTER — Encounter: Payer: Self-pay | Admitting: Family Medicine

## 2015-06-22 ENCOUNTER — Ambulatory Visit (INDEPENDENT_AMBULATORY_CARE_PROVIDER_SITE_OTHER): Payer: Medicare Other | Admitting: Family Medicine

## 2015-06-22 VITALS — BP 114/62 | HR 101 | Ht 68.0 in | Wt 221.0 lb

## 2015-06-22 DIAGNOSIS — S83206D Unspecified tear of unspecified meniscus, current injury, right knee, subsequent encounter: Secondary | ICD-10-CM | POA: Diagnosis not present

## 2015-06-22 NOTE — Progress Notes (Signed)
[name] [address] [place], [place] [address] Phone: [phone] Subjective:    CC: Right knee pain   [id number] Todd Larson is a 75 y.o. male coming in with complaint of right knee pain.10 have signed symptoms that were consistent with more of a meniscal tear. Patient was given an injection. Patient did have x-rays that were in apparently visualized by me with very mild osteophytic changes. Patient states that after the injection he is feeling 90-95% better. Still has some mild discomfort from time to time but nothing severe. Able to do daily activities. Patient states twisting motion still uncomfortable but nonpainful. No locking or giving out on him.     Past Medical History  Diagnosis Date  . GERD (gastroesophageal reflux disease)   . PSA elevation     History of   . Diverticulosis of colon   . Bulging discs      thoracic spine  . Hypertension   . Inguinal hernia   . Hyperlipidemia   . OSA (obstructive sleep apnea)   . Recurrent sinus infections    Past Surgical History  Procedure Laterality Date  . Tonsillectomy    . Colonoscopy  2008    3 internal hemorrhoids, diverticulosis;Dr.Jacobs  . Prostate biopsy      x 2, once abnormal, once normal   Social History   Social History  . Marital Status: Single    Spouse Name: N/A  . Number of Children: 0  . Years of Education: N/A   Occupational History  . retired    Social History Main Topics  . Smoking status: Never Smoker   . Smokeless tobacco: Never Used  . Alcohol Use: 1.2 oz/week    2 Glasses of wine per week     Comment: social/ drinks wine(white)  . Drug Use: No  . Sexual Activity: Not Asked   Other Topics Concern  . 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          Allergies  Allergen Reactions  . Amoxicillin-Pot Clavulanate     REACTION: diarrhea/night sweats   Family  History  Problem Relation Age of Onset  . Lung cancer Maternal Grandfather     enviromental   . Heart attack Paternal Uncle   . Prostate cancer Brother     Past medical history, social, surgical and family history all reviewed in electronic medical record.  No pertanent information unless stated regarding to the chief complaint.   Review of Systems: No headache, visual changes, nausea, vomiting, diarrhea, constipation, dizziness, abdominal pain, skin rash, fevers, chills, night sweats, weight loss, swollen lymph nodes, body aches, joint swelling, muscle aches, chest pain, shortness of breath, mood changes.   Objective Blood pressure 114/62, pulse 101, height 5\' 8"  (1.727 m), weight 221 lb (100.245 kg), SpO2 96 %.  General: No apparent distress alert and oriented x3 mood and affect normal, dressed appropriately.  HEENT: Pupils equal, extraocular movements intact  Respiratory: Patient's speak in full sentences and does not appear short of breath  Cardiovascular: No lower extremity edema, non tender, no erythema  Skin: Warm dry intact with no signs of infection or rash on extremities or on axial skeleton.  Abdomen: Soft nontender  Neuro: Cranial nerves II through XII are intact, neurovascularly intact in all extremities with 2+ DTRs and 2+  pulses.  Lymph: No lymphadenopathy of posterior or anterior cervical chain or axillae bilaterally.  Gait normal with good balance and coordination.  MSK:  Non tender with full range of motion and good stability and symmetric strength and tone of shoulders, elbows, wrist, hip, and ankles bilaterally.  Knee: Right Normal to inspection with no erythema or effusion or obvious bony abnormalities. Nontender today ROM full in flexion and extension and lower leg rotation. Ligaments with solid consistent endpoints including ACL, PCL, LCL, MCL. Positive Mcmurray's, Apley's, and Thessalonian tests still positive Non painful patellar compression. Patellar glide  with mild crepitus. Patellar and quadriceps tendons unremarkable. Hamstring and quadriceps strength is normal.  Contralateral knee unremarkable     Impression and Recommendations:     This case required medical decision making of moderate complexity.      Note: This dictation was prepared with Dragon dictation along with smaller phrase technology. Any transcriptional errors that result from this process are unintentional.

## 2015-06-22 NOTE — Patient Instructions (Signed)
Good to see you  Ice is your friend I am impressed Keep it up  Try to walk with the toe a little more straight Back is holding strong Keep working on posture See me again in 6 weeks if knee is not perfect

## 2015-06-22 NOTE — Progress Notes (Signed)
Pre visit review using our clinic review tool, if applicable. No additional management support is needed unless otherwise documented below in the visit note. 

## 2015-06-22 NOTE — Assessment & Plan Note (Signed)
Doing much better at this time. We discussed icing and continuing the home exercises. His lungs patient continues to improve we will continue to monitor. If worsening symptoms patient will come back sooner otherwise follow-up again in 6 weeks.

## 2015-08-02 ENCOUNTER — Ambulatory Visit (INDEPENDENT_AMBULATORY_CARE_PROVIDER_SITE_OTHER): Payer: Medicare Other | Admitting: Adult Health

## 2015-08-02 ENCOUNTER — Encounter: Payer: Self-pay | Admitting: Family Medicine

## 2015-08-02 ENCOUNTER — Encounter: Payer: Self-pay | Admitting: Adult Health

## 2015-08-02 ENCOUNTER — Encounter: Payer: Medicare Other | Admitting: Adult Health

## 2015-08-02 VITALS — BP 128/64 | Temp 98.3°F | Ht 68.0 in | Wt 221.8 lb

## 2015-08-02 DIAGNOSIS — E669 Obesity, unspecified: Secondary | ICD-10-CM | POA: Diagnosis not present

## 2015-08-02 DIAGNOSIS — I10 Essential (primary) hypertension: Secondary | ICD-10-CM | POA: Diagnosis not present

## 2015-08-02 DIAGNOSIS — E785 Hyperlipidemia, unspecified: Secondary | ICD-10-CM

## 2015-08-02 LAB — BASIC METABOLIC PANEL
BUN: 15 mg/dL (ref 6–23)
CO2: 28 meq/L (ref 19–32)
Calcium: 9.2 mg/dL (ref 8.4–10.5)
Chloride: 104 mEq/L (ref 96–112)
Creatinine, Ser: 1.03 mg/dL (ref 0.40–1.50)
GFR: 75.23 mL/min (ref >60.00)
GLUCOSE: 119 mg/dL — AB (ref 70–99)
POTASSIUM: 3.8 meq/L (ref 3.5–5.1)
SODIUM: 140 meq/L (ref 135–145)

## 2015-08-02 LAB — CBC WITH DIFFERENTIAL/PLATELET
BASOS ABS: 0 10*3/uL (ref 0.0–0.1)
Basophils Relative: 0.3 % (ref 0.0–3.0)
EOS ABS: 0.3 10*3/uL (ref 0.0–0.7)
Eosinophils Relative: 4.3 % (ref 0.0–5.0)
HCT: 46.8 % (ref 39.0–52.0)
Hemoglobin: 16.2 g/dL (ref 13.0–17.0)
LYMPHS ABS: 2.1 10*3/uL (ref 0.7–4.0)
Lymphocytes Relative: 27.3 % (ref 12.0–46.0)
MCHC: 34.7 g/dL (ref 30.0–36.0)
MCV: 88.8 fl (ref 78.0–100.0)
MONO ABS: 0.6 10*3/uL (ref 0.1–1.0)
Monocytes Relative: 7.4 % (ref 3.0–12.0)
NEUTROS ABS: 4.6 10*3/uL (ref 1.4–7.7)
NEUTROS PCT: 60.7 % (ref 43.0–77.0)
PLATELETS: 251 10*3/uL (ref 150.0–400.0)
RBC: 5.27 Mil/uL (ref 4.22–5.81)
RDW: 13.1 % (ref 11.5–15.5)
WBC: 7.6 10*3/uL (ref 4.0–10.5)

## 2015-08-02 LAB — LIPID PANEL
CHOL/HDL RATIO: 4
Cholesterol: 156 mg/dL (ref 0–200)
HDL: 41.3 mg/dL (ref >39.00)
LDL CALC: 98 mg/dL (ref 0–99)
NONHDL: 114.72
TRIGLYCERIDES: 83 mg/dL (ref 0.0–149.0)
VLDL: 16.6 mg/dL (ref 0.0–40.0)

## 2015-08-02 LAB — HEPATIC FUNCTION PANEL
ALK PHOS: 51 U/L (ref 39–117)
ALT: 24 U/L (ref 0–53)
AST: 18 U/L (ref 0–37)
Albumin: 4 g/dL (ref 3.5–5.2)
BILIRUBIN DIRECT: 0.2 mg/dL (ref 0.0–0.3)
BILIRUBIN TOTAL: 1.3 mg/dL — AB (ref 0.2–1.2)
TOTAL PROTEIN: 6.3 g/dL (ref 6.0–8.3)

## 2015-08-02 LAB — POC URINALSYSI DIPSTICK (AUTOMATED)
GLUCOSE UA: NEGATIVE
Ketones, UA: NEGATIVE
Leukocytes, UA: NEGATIVE
NITRITE UA: NEGATIVE
Protein, UA: NEGATIVE
RBC UA: NEGATIVE
SPEC GRAV UA: 1.025
UROBILINOGEN UA: 1
pH, UA: 5.5

## 2015-08-02 LAB — TSH: TSH: 1.99 u[IU]/mL (ref 0.35–4.50)

## 2015-08-02 LAB — LIPASE: Lipase: 23 U/L (ref 11.0–59.0)

## 2015-08-02 NOTE — Progress Notes (Signed)
Subjective:    Patient ID: Todd Larson, male    DOB: [birth year], 75 y.o.   MRN: [id number]  HPI  Patient presents for yearly preventative medicine examination due to hypertension, obesity, and hyperlipidemia . He is a 75 year old male who  has a past medical history of Bulging discs; Diverticulosis of colon; GERD (gastroesophageal reflux disease); Hyperlipidemia; Hypertension; Inguinal hernia; OSA (obstructive sleep apnea); PSA elevation; and Recurrent sinus infections.  All immunizations and health maintenance protocols were reviewed with the patient and needed orders were placed.  Appropriate screening laboratory values were ordered for the patient including screening of hyperlipidemia, renal function and hepatic function. If indicated by BPH, a PSA was ordered.  Medication reconciliation, past medical history, social history, problem list and allergies were reviewed in detail with the patient  Goals were established with regard to weight loss, exercise, and  diet in compliance with medications. He tries to eat healthy and is staying active.   He is up to date on his eye, dental and will have a colonoscopy within the next year.   He has seen Dr. Tamala Julian with sports medicine and feels as though after the cortisone injection he received in the right knee, his pain has gone away. He continues to have right knee discomfort when kneeling and standing.   Review of Systems  Constitutional: Negative.   HENT: Negative.   Eyes: Negative.   Respiratory: Negative.   Cardiovascular: Negative.   Gastrointestinal: Negative.   Endocrine: Negative.   Genitourinary: Negative.   Musculoskeletal: Positive for arthralgias (chronic ), back pain (chronic ) and neck pain (chronic ). Negative for gait problem and joint swelling.  Skin: Negative.   Allergic/Immunologic: Negative.   Neurological: Negative.   Hematological: Negative.   Psychiatric/Behavioral: Negative.    Past Medical History:  Diagnosis  Date  . Bulging discs     thoracic spine  . Diverticulosis of colon   . GERD (gastroesophageal reflux disease)   . Hyperlipidemia   . Hypertension   . Inguinal hernia   . OSA (obstructive sleep apnea)   . PSA elevation    History of   . Recurrent sinus infections     Social History   Social History  . Marital status: Single    Spouse name: N/A  . Number of children: 0  . Years of education: N/A   Occupational History  . retired    Social History Main Topics  . Smoking status: Never Smoker  . Smokeless tobacco: Never Used  . Alcohol use 1.2 oz/week    2 Glasses of wine per week     Comment: social/ drinks wine(white)  . Drug use: No  . Sexual activity: Not on file   Other Topics Concern  . 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           Past Surgical History:  Procedure Laterality Date  . COLONOSCOPY  2008   3 internal hemorrhoids, diverticulosis;Dr.Jacobs  . PROSTATE BIOPSY     x 2, once abnormal, once normal  . TONSILLECTOMY      Family History  Problem Relation Age of Onset  . Lung cancer Maternal Grandfather     enviromental   . Heart attack Paternal Uncle   . Prostate cancer Brother     Allergies  Allergen Reactions  .  Amoxicillin-Pot Clavulanate     REACTION: diarrhea/night sweats    Current Outpatient Prescriptions on File Prior to Visit  Medication Sig Dispense Refill  . AMBULATORY NON FORMULARY MEDICATION Medication Name: Neck Traction Device, DX: Cervical Discomfort/irritation 1 each 0  . amitriptyline (ELAVIL) 25 MG tablet at bedtime as needed. 1/2 qhs prn for LBP    . cyclobenzaprine (FLEXERIL) 5 MG tablet Take 1 tablet (5 mg total) by mouth 3 (three) times daily as needed for muscle spasms. 30 tablet 6  . finasteride (PROSCAR) 5 MG tablet Take 5 mg by mouth daily.    . fluticasone (FLONASE) 50 MCG/ACT nasal spray Place 1 spray into  both nostrils 2 (two) times daily as needed for rhinitis. 16 g 11  . gabapentin (NEURONTIN) 100 MG capsule Take 1 capsule (100 mg total) by mouth 3 (three) times daily. (Patient taking differently: Take 100 mg by mouth 3 (three) times daily as needed. ) 90 capsule 3  . hydrochlorothiazide (HYDRODIURIL) 25 MG tablet Take 1 tablet (25 mg total) by mouth daily. 30 tablet 3  . hyoscyamine (ANASPAZ) 0.125 MG TBDP disintergrating tablet Place 1 tablet (0.125 mg total) under the tongue every 4 (four) hours as needed. 30 tablet 1  . losartan (COZAAR) 50 MG tablet Take 1 tablet (50 mg total) by mouth daily. 90 tablet 3  . meloxicam (MOBIC) 7.5 MG tablet 1 tablet twice daily as needed.    . montelukast (SINGULAIR) 10 MG tablet Take 1 tablet (10 mg total) by mouth at bedtime. (Patient taking differently: Take 10 mg by mouth as needed. ) 30 tablet 3  . traMADol (ULTRAM) 50 MG tablet TAKE 1 TABLET BY MOUTH EVERY 6 HOURS AS NEEDED 30 tablet 1   No current facility-administered medications on file prior to visit.     BP 128/64   Temp 98.3 F (36.8 C) (Oral)   Ht 5\' 8"  (1.727 m)   Wt 221 lb 12.8 oz (100.6 kg)   BMI 33.72 kg/m       Objective:   Physical Exam  Constitutional: He is oriented to person, place, and time. He appears well-developed and well-nourished. No distress.  obese  HENT:  Head: Normocephalic and atraumatic.  Right Ear: External ear normal.  Left Ear: External ear normal.  Nose: Nose normal.  Mouth/Throat: Oropharynx is clear and moist. No oropharyngeal exudate.  Eyes: Conjunctivae are normal. Pupils are equal, round, and reactive to light. Right eye exhibits no discharge. Left eye exhibits no discharge.  Neck: Normal range of motion. Neck supple. Normal carotid pulses and no JVD present. Carotid bruit is not present. No tracheal deviation present. No thyromegaly present.  Cardiovascular: Normal rate, regular rhythm, normal heart sounds and intact distal pulses.  Exam reveals no  gallop and no friction rub.   No murmur heard. Pulmonary/Chest: Effort normal and breath sounds normal. No stridor. No respiratory distress. He has no wheezes. He has no rales. He exhibits no tenderness.  Abdominal: Soft. Bowel sounds are normal. He exhibits no distension and no mass. There is no tenderness. There is no rebound and no guarding.  Genitourinary:  Genitourinary Comments: Deferred due to seeing Urology every 6 months  Musculoskeletal: Normal range of motion. He exhibits no edema, tenderness or deformity.  Lymphadenopathy:    He has no cervical adenopathy.  Neurological: He is alert and oriented to person, place, and time. He has normal reflexes. He displays normal reflexes. No cranial nerve deficit. He exhibits normal muscle tone. Coordination normal.  Skin: Skin is warm and dry. No rash noted. He is not diaphoretic. No erythema. No pallor.  Psychiatric: He has a normal mood and affect. His behavior is normal. Judgment and thought content normal.  Nursing note and vitals reviewed.     Assessment & Plan:  1. Hyperlipidemia - POCT Urinalysis Dipstick (Automated) - Basic metabolic panel - Lipase - Lipid panel - CBC with Differential/Platelet - TSH - Hepatic function panel - Diet controlled at this point.  - Will consider adding medication if needed 2. Essential hypertension - Well controlled on current medication  - POCT Urinalysis Dipstick (Automated) - Basic metabolic panel - Lipase - Lipid panel - CBC with Differential/Platelet - TSH - Hepatic function panel  3. Obesity - Educated on the importance of diet and exercise.  - POCT Urinalysis Dipstick (Automated) - Basic metabolic panel - Lipase - Lipid panel - CBC with Differential/Platelet - TSH - Hepatic function panel   Dorothyann Peng, NP

## 2015-08-03 ENCOUNTER — Ambulatory Visit: Payer: Medicare Other | Admitting: Family Medicine

## 2015-08-23 DIAGNOSIS — D485 Neoplasm of uncertain behavior of skin: Secondary | ICD-10-CM | POA: Diagnosis not present

## 2015-08-23 DIAGNOSIS — L821 Other seborrheic keratosis: Secondary | ICD-10-CM | POA: Diagnosis not present

## 2015-08-23 DIAGNOSIS — D225 Melanocytic nevi of trunk: Secondary | ICD-10-CM | POA: Diagnosis not present

## 2015-09-01 ENCOUNTER — Other Ambulatory Visit: Payer: Self-pay | Admitting: Adult Health

## 2015-09-01 DIAGNOSIS — I10 Essential (primary) hypertension: Secondary | ICD-10-CM

## 2015-09-29 DIAGNOSIS — R358 Other polyuria: Secondary | ICD-10-CM | POA: Diagnosis not present

## 2015-09-29 DIAGNOSIS — R972 Elevated prostate specific antigen [PSA]: Secondary | ICD-10-CM | POA: Diagnosis not present

## 2015-09-29 DIAGNOSIS — N401 Enlarged prostate with lower urinary tract symptoms: Secondary | ICD-10-CM | POA: Diagnosis not present

## 2015-09-29 DIAGNOSIS — N138 Other obstructive and reflux uropathy: Secondary | ICD-10-CM | POA: Diagnosis not present

## 2015-11-16 DIAGNOSIS — H2513 Age-related nuclear cataract, bilateral: Secondary | ICD-10-CM | POA: Diagnosis not present

## 2015-12-28 ENCOUNTER — Other Ambulatory Visit: Payer: Self-pay | Admitting: Adult Health

## 2015-12-28 DIAGNOSIS — I10 Essential (primary) hypertension: Secondary | ICD-10-CM

## 2016-01-03 ENCOUNTER — Encounter: Payer: Self-pay | Admitting: Family Medicine

## 2016-01-04 DIAGNOSIS — D485 Neoplasm of uncertain behavior of skin: Secondary | ICD-10-CM | POA: Diagnosis not present

## 2016-01-04 NOTE — Progress Notes (Signed)
[name] [address], [place] [address] Phone: [phone] Subjective:     CC: Neck pain  [id number]  Todd Larson is a 76 y.o. male coming in with complaint of neck pain. Patient's has not see me in quite some time. Past medical history significant for severe facet arthritis of the cervical spine at multiple levels facet arthritis.MRI in October 2015. This was independently visualized by me again today. Patient states he was doing very well over the course last year. Patient was taking the medications fairly intermittently. Not doing the exercises regularly but felt like he did not needed. Patient states Unfortunately now having right sided neck pain. Seems to be severe. Having radicular symptoms going down the right arm. Patient denies any weakness. States though that it is very painful. Patient has been taking some ibuprofen with no significant improvement. Has even had tramadol which did give him some improvement. Seems to be worse at night. Rates the severity of pain a 7 out of 10    Past Medical History:  Diagnosis Date  . Bulging discs     thoracic spine  . Diverticulosis of colon   . GERD (gastroesophageal reflux disease)   . Hyperlipidemia   . Hypertension   . Inguinal hernia   . OSA (obstructive sleep apnea)   . PSA elevation    History of   . Recurrent sinus infections    Past Surgical History:  Procedure Laterality Date  . COLONOSCOPY  2008   3 internal hemorrhoids, diverticulosis;Dr.Jacobs  . PROSTATE BIOPSY     x 2, once abnormal, once normal  . TONSILLECTOMY     Social History   Social History  . Marital status: Single    Spouse name: N/A  . Number of children: 0  . Years of education: N/A   Occupational History  . retired    Social History Main Topics  . Smoking status: Never Smoker  . Smokeless tobacco: Never Used  . Alcohol use 1.2 oz/week    2 Glasses of wine per week     Comment: social/ drinks  wine(white)  . Drug use: No  . Sexual activity: Not Asked   Other Topics Concern  . 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          Allergies  Allergen Reactions  . Amoxicillin-Pot Clavulanate     REACTION: diarrhea/night sweats   Family History  Problem Relation Age of Onset  . Lung cancer Maternal Grandfather     enviromental   . Heart attack Paternal Uncle   . Prostate cancer Brother     Past medical history, social, surgical and family history all reviewed in electronic medical record.  No pertanent information unless stated regarding to the chief complaint.   Review of Systems:Review of systems updated and as accurate as of 01/05/16  No headache, visual changes, nausea, vomiting, diarrhea, constipation, dizziness, abdominal pain, skin rash, fevers, chills, night sweats, weight loss, swollen lymph nodes, body aches, joint swelling, muscle aches, chest pain, shortness of breath, mood changes.   Objective  Blood pressure 132/72, pulse 98, height 5\' 8"  (1.727 m), weight 224 lb (101.6 kg), SpO2 95 %. Systems examined below as of 01/05/16   General: No apparent distress alert and oriented x3 mood and affect normal, dressed appropriately.  HEENT: Pupils equal, extraocular movements intact  Respiratory: Patient's speak in full sentences and does not appear short of breath  Cardiovascular: No lower extremity edema, non tender, no erythema  Skin: Warm dry intact with no signs of infection or rash on extremities or on axial skeleton.  Abdomen: Soft nontender Umbilical hernia noted Neuro: Cranial nerves II through XII are intact, neurovascularly intact in all extremities with 2+ DTRs and 2+ pulses.  Lymph: No lymphadenopathy of posterior or anterior cervical chain or axillae bilaterally.  Gait normal with good balance and coordination.  MSK:  Non tender with full range of motion and good  stability and symmetric strength and tone of shoulders, elbows, wrist, hip, knee and ankles bilaterally. Mild arthritic changes of multiple joints. Neck: Inspection shows significant increase in lordosis No palpable stepoffs. Positive Spurling's maneuver C6 distribution right side. Significant limitation in range of motion in all planes. Patient only has 5 of extension. Grip strength and sensation normal in bilateral hands Strength good C4 to T1 distribution No sensory change to C4 to T1 Negative Hoffman sign bilaterally Reflexes normal   Impression and Recommendations:     This case required medical decision making of moderate complexity.      Note: This dictation was prepared with Dragon dictation along with smaller phrase technology. Any transcriptional errors that result from this process are unintentional.

## 2016-01-05 ENCOUNTER — Ambulatory Visit (INDEPENDENT_AMBULATORY_CARE_PROVIDER_SITE_OTHER): Payer: Medicare Other | Admitting: Family Medicine

## 2016-01-05 ENCOUNTER — Encounter: Payer: Self-pay | Admitting: Family Medicine

## 2016-01-05 VITALS — BP 132/72 | HR 98 | Ht 68.0 in | Wt 224.0 lb

## 2016-01-05 DIAGNOSIS — M5010 Cervical disc disorder with radiculopathy, unspecified cervical region: Secondary | ICD-10-CM

## 2016-01-05 DIAGNOSIS — Z23 Encounter for immunization: Secondary | ICD-10-CM | POA: Diagnosis not present

## 2016-01-05 MED ORDER — GABAPENTIN 100 MG PO CAPS: 200.0000 mg | ORAL_CAPSULE | Freq: Every day | ORAL | 1 refills | Status: DC

## 2016-01-05 MED ORDER — PREDNISONE 50 MG PO TABS: 50.0000 mg | ORAL_TABLET | Freq: Every day | ORAL | 0 refills | Status: DC

## 2016-01-05 NOTE — Patient Instructions (Signed)
Good to see you  Alvera Singh is your friend.  Prednisone daily for 5 days.  Gabapentin 200mg  at night or at least 100mg   Lets flip the pillow and see if that helps.  Worsening pain call me and we will get Tramadol as well.  See me again within 2 weeks (OK to double book)

## 2016-01-05 NOTE — Assessment & Plan Note (Signed)
Patient is having worsening symptoms. Patient's MRI is greater than 76 years old. Discussed with him we may need further imaging. At this time that we will start him on prednisone and warned of potential side effects.We discussed icing regimen. We discussed home exercises. Started gabapentin at night. Follow-up again within the next 10-12 days. At that time if no significant improvement further imaging is likely necessary. Have improvement we will start on home exercise regimen.

## 2016-01-06 ENCOUNTER — Encounter: Payer: Self-pay | Admitting: Family Medicine

## 2016-01-16 ENCOUNTER — Encounter: Payer: Self-pay | Admitting: Gastroenterology

## 2016-01-19 ENCOUNTER — Ambulatory Visit: Payer: Medicare Other | Admitting: Family Medicine

## 2016-01-26 ENCOUNTER — Encounter: Payer: Self-pay | Admitting: Family Medicine

## 2016-01-26 ENCOUNTER — Ambulatory Visit (INDEPENDENT_AMBULATORY_CARE_PROVIDER_SITE_OTHER): Payer: Medicare Other | Admitting: Family Medicine

## 2016-01-26 ENCOUNTER — Ambulatory Visit (INDEPENDENT_AMBULATORY_CARE_PROVIDER_SITE_OTHER): Payer: Medicare Other | Admitting: Adult Health

## 2016-01-26 ENCOUNTER — Encounter: Payer: Self-pay | Admitting: Adult Health

## 2016-01-26 VITALS — BP 138/62 | Temp 98.8°F | Ht 68.0 in | Wt 224.0 lb

## 2016-01-26 DIAGNOSIS — J0191 Acute recurrent sinusitis, unspecified: Secondary | ICD-10-CM

## 2016-01-26 DIAGNOSIS — H6121 Impacted cerumen, right ear: Secondary | ICD-10-CM | POA: Diagnosis not present

## 2016-01-26 DIAGNOSIS — M4722 Other spondylosis with radiculopathy, cervical region: Secondary | ICD-10-CM

## 2016-01-26 NOTE — Progress Notes (Signed)
[name] [place] [place] [address], [place] [address] Phone: [phone] Subjective:     CC: Neck pain f/u  [id number]  Todd Larson is a 76 y.o. male coming in with complaint of neck pain. Patient's has not see me in quite some time. Past medical history significant for severe facet arthritis of the cervical spine at multiple levels facet arthritis.MRI in October 2015. Patient does have significant spinal stenosis noted at C5-C6. Patient was having exacerbation at last exam. Was given gabapentin which she is not taking regularly. Patient states that he has made some improvement. States that the radiation is more intermittent but it was previously. no weakness.   Past Medical History:  Diagnosis Date  . Bulging discs     thoracic spine  . Diverticulosis of colon   . GERD (gastroesophageal reflux disease)   . Hyperlipidemia   . Hypertension   . Inguinal hernia   . OSA (obstructive sleep apnea)   . PSA elevation    History of   . Recurrent sinus infections    Past Surgical History:  Procedure Laterality Date  . COLONOSCOPY  2008   3 internal hemorrhoids, diverticulosis;Dr.Jacobs  . PROSTATE BIOPSY     x 2, once abnormal, once normal  . TONSILLECTOMY     Social History   Social History  . Marital status: Single    Spouse name: N/A  . Number of children: 0  . Years of education: N/A   Occupational History  . retired    Social History Main Topics  . Smoking status: Never Smoker  . Smokeless tobacco: Never Used  . Alcohol use 1.2 oz/week    2 Glasses of wine per week     Comment: social/ drinks wine(white)  . Drug use: No  . Sexual activity: Not Asked   Other Topics Concern  . 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          Allergies  Allergen Reactions  . Amoxicillin-Pot Clavulanate     REACTION: diarrhea/night sweats   Family  History  Problem Relation Age of Onset  . Lung cancer Maternal Grandfather     enviromental   . Heart attack Paternal Uncle   . Prostate cancer Brother     Past medical history, social, surgical and family history all reviewed in electronic medical record.  No pertanent information unless stated regarding to the chief complaint.   Review of Systems: No headache, visual changes, nausea, vomiting, diarrhea, constipation, dizziness, abdominal pain, skin rash, fevers, chills, night sweats, weight loss, swollen lymph nodes, body aches, joint swelling, muscle aches, chest pain, shortness of breath, mood changes.   recent head pressure likely sinus infection but no fever.     Objective  Blood pressure 138/72, pulse 88, height 5\' 8"  (1.727 m), weight 223 lb 9.6 oz (101.4 kg).   Systems examined below as of 01/26/16 General: NAD A&O x3 mood, affect normal  HEENT: Pupils equal, extraocular movements intact no nystagmus Respiratory: not short of breath at rest or with speaking Cardiovascular: No lower extremity edema, non tender Skin: Warm dry intact with no signs of infection or rash on extremities or on axial skeleton. Abdomen: Soft nontender, no masses Neuro: Cranial nerves  intact, neurovascularly intact in all extremities with 2+ DTRs and 2+ pulses. Lymph: No lymphadenopathy  appreciated today  Gait normal with good balance and coordination.  MSK: Non tender with full range of motion and good stability and symmetric strength and tone of shoulders, elbows, wrist,  knee hips and ankles bilaterally.  Arthritic changes of multiple joints.  Neck: Inspection shows significant increase in lordosis No palpable stepoffs. Continued positive spurlings, radicular on right side.  Grip strength and sensation normal in bilateral hands Strength good C4 to T1 distribution No sensory change to C4 to T1 Negative Hoffman sign bilaterally Reflexes normal   Impression and Recommendations:     This case  required medical decision making of moderate complexity.      Note: This dictation was prepared with Dragon dictation along with smaller phrase technology. Any transcriptional errors that result from this process are unintentional.

## 2016-01-26 NOTE — Progress Notes (Signed)
Subjective:    Patient ID: Todd Larson, male    DOB: [birth year], 76 y.o.   MRN: [id number]  HPI  76 year old male who presents to the office with the acute complaint of ear pain, tinnitus, sinus pain and pressure and feeling as though his ear is full.   He reports that his sinus pain and pressure has started to resolve but he continues to have right ear pain and ringing in his ear.   Review of Systems  Constitutional: Negative.   HENT: Positive for congestion, ear pain, postnasal drip, sinus pain, sinus pressure and tinnitus. Negative for ear discharge, sore throat, trouble swallowing and voice change.   Respiratory: Negative.   Cardiovascular: Negative.   All other systems reviewed and are negative.  Past Medical History:  Diagnosis Date  . Bulging discs     thoracic spine  . Diverticulosis of colon   . GERD (gastroesophageal reflux disease)   . Hyperlipidemia   . Hypertension   . Inguinal hernia   . OSA (obstructive sleep apnea)   . PSA elevation    History of   . Recurrent sinus infections     Social History   Social History  . Marital status: Single    Spouse name: N/A  . Number of children: 0  . Years of education: N/A   Occupational History  . retired    Social History Main Topics  . Smoking status: Never Smoker  . Smokeless tobacco: Never Used  . Alcohol use 1.2 oz/week    2 Glasses of wine per week     Comment: social/ drinks wine(white)  . Drug use: No  . Sexual activity: Not on file   Other Topics Concern  . 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           Past Surgical History:  Procedure Laterality Date  . COLONOSCOPY  2008   3 internal hemorrhoids, diverticulosis;Dr.Jacobs  . PROSTATE BIOPSY     x 2, once abnormal, once normal  . TONSILLECTOMY      Family History  Problem Relation Age of Onset  . Lung cancer Maternal Grandfather    enviromental   . Heart attack Paternal Uncle   . Prostate cancer Brother     Allergies  Allergen Reactions  . Amoxicillin-Pot Clavulanate     REACTION: diarrhea/night sweats    Current Outpatient Prescriptions on File Prior to Visit  Medication Sig Dispense Refill  . AMBULATORY NON FORMULARY MEDICATION Medication Name: Neck Traction Device, DX: Cervical Discomfort/irritation 1 each 0  . amitriptyline (ELAVIL) 25 MG tablet at bedtime as needed. 1/2 qhs prn for LBP    . cyclobenzaprine (FLEXERIL) 5 MG tablet Take 1 tablet (5 mg total) by mouth 3 (three) times daily as needed for muscle spasms. 30 tablet 6  . finasteride (PROSCAR) 5 MG tablet Take 5 mg by mouth daily.    . fluticasone (FLONASE) 50 MCG/ACT nasal spray Place 1 spray into both nostrils 2 (two) times daily as needed for rhinitis. 16 g 11  . gabapentin (NEURONTIN) 100 MG capsule Take 2 capsules (200 mg total) by mouth at bedtime. 60 capsule 1  . hydrochlorothiazide (HYDRODIURIL) 25 MG tablet TAKE ONE TABLET BY MOUTH ONCE DAILY 30 tablet 3  . hyoscyamine (ANASPAZ) 0.125 MG TBDP disintergrating tablet Place  1 tablet (0.125 mg total) under the tongue every 4 (four) hours as needed. 30 tablet 1  . losartan (COZAAR) 50 MG tablet Take 1 tablet (50 mg total) by mouth daily. 90 tablet 3  . meloxicam (MOBIC) 7.5 MG tablet 1 tablet twice daily as needed.    . montelukast (SINGULAIR) 10 MG tablet Take 1 tablet (10 mg total) by mouth at bedtime. (Patient taking differently: Take 10 mg by mouth as needed. ) 30 tablet 3  . traMADol (ULTRAM) 50 MG tablet TAKE 1 TABLET BY MOUTH EVERY 6 HOURS AS NEEDED 30 tablet 1   No current facility-administered medications on file prior to visit.     BP 138/62   Temp 98.8 F (37.1 C) (Oral)   Ht 5\' 8"  (1.727 m)   Wt 224 lb (101.6 kg)   BMI 34.06 kg/m       Objective:   Physical Exam  Constitutional: He is oriented to person, place, and time. He appears well-developed and well-nourished. No  distress.  HENT:  Head: Normocephalic and atraumatic.  Right Ear: Tympanic membrane and external ear normal.  Left Ear: Hearing, tympanic membrane, external ear and ear canal normal. Tympanic membrane is not erythematous and not bulging.  Nose: Nose normal.  Mouth/Throat: Oropharynx is clear and moist. No oropharyngeal exudate.  TM not visualized in right ear due to cerumen impaction   Cardiovascular: Normal rate, regular rhythm, normal heart sounds and intact distal pulses.  Exam reveals no gallop and no friction rub.   No murmur heard. Pulmonary/Chest: Effort normal and breath sounds normal. No respiratory distress. He has no wheezes. He has no rales. He exhibits no tenderness.  Neurological: He is alert and oriented to person, place, and time.  Skin: Skin is warm and dry. No rash noted. He is not diaphoretic. No erythema. No pallor.  Psychiatric: He has a normal mood and affect. His behavior is normal. Judgment and thought content normal.  Nursing note and vitals reviewed.     Assessment & Plan:  Right ear was irrigated and a large cerumen impaction was removed. Patient endorsed feeling improved after cerumen was removed. No signs of otitis media in right ear. Advised to continue with flonase   Follow up as needed  Dorothyann Peng, NP

## 2016-01-26 NOTE — Assessment & Plan Note (Signed)
Patient does have some spinal stenosis noted. Has radicular symptom from time to time. Seems to be only at night at this time. Patient was to continue with conservative therapy. We'll use gabapentin intermittently. His lungs patient does well we'll follow-up as needed.

## 2016-03-11 ENCOUNTER — Other Ambulatory Visit: Payer: Self-pay | Admitting: Adult Health

## 2016-03-11 DIAGNOSIS — I10 Essential (primary) hypertension: Secondary | ICD-10-CM

## 2016-03-16 ENCOUNTER — Other Ambulatory Visit: Payer: Self-pay

## 2016-03-16 ENCOUNTER — Encounter: Payer: Self-pay | Admitting: Adult Health

## 2016-03-16 DIAGNOSIS — J309 Allergic rhinitis, unspecified: Principal | ICD-10-CM

## 2016-03-16 DIAGNOSIS — H1010 Acute atopic conjunctivitis, unspecified eye: Secondary | ICD-10-CM

## 2016-03-16 MED ORDER — FLUTICASONE PROPIONATE 50 MCG/ACT NA SUSP: 1.0000 | Freq: Two times a day (BID) | NASAL | 11 refills | Status: DC | PRN

## 2016-03-27 DIAGNOSIS — N401 Enlarged prostate with lower urinary tract symptoms: Secondary | ICD-10-CM | POA: Diagnosis not present

## 2016-03-27 DIAGNOSIS — N138 Other obstructive and reflux uropathy: Secondary | ICD-10-CM | POA: Diagnosis not present

## 2016-04-03 ENCOUNTER — Encounter: Payer: Self-pay | Admitting: Adult Health

## 2016-04-03 ENCOUNTER — Ambulatory Visit (INDEPENDENT_AMBULATORY_CARE_PROVIDER_SITE_OTHER): Payer: Medicare Other | Admitting: Adult Health

## 2016-04-03 VITALS — BP 118/60 | Temp 97.9°F | Ht 68.0 in | Wt 227.4 lb

## 2016-04-03 DIAGNOSIS — K4091 Unilateral inguinal hernia, without obstruction or gangrene, recurrent: Secondary | ICD-10-CM | POA: Diagnosis not present

## 2016-04-03 NOTE — Progress Notes (Signed)
Subjective:    Patient ID: Todd Larson, male    DOB: [birth year], 76 y.o.   MRN: [id number]  HPI  76 year old male who  has a past medical history of Bulging discs; Diverticulosis of colon; GERD (gastroesophageal reflux disease); Hyperlipidemia; Hypertension; Inguinal hernia; OSA (obstructive sleep apnea); PSA elevation; and Recurrent sinus infections. Presents to the office today with the complaint of left inguinal hernia. This has been an ongoing issue but he reports recently he's had more trouble with his inguinal hernia. Per patient he reports that he has had tenderness with palpation, decreased bowel movements, and his left testicle will not descend completely. He was recently seen by his urologist and had an ultrasound done which was inconclusive. I do not have these records.  He denies any bruising, unable to void completely, or severe pain in the left inguinal area.   Review of Systems See HPI   Past Medical History:  Diagnosis Date  . Bulging discs     thoracic spine  . Diverticulosis of colon   . GERD (gastroesophageal reflux disease)   . Hyperlipidemia   . Hypertension   . Inguinal hernia   . OSA (obstructive sleep apnea)   . PSA elevation    History of   . Recurrent sinus infections     Social History   Social History  . Marital status: Single    Spouse name: N/A  . Number of children: 0  . Years of education: N/A   Occupational History  . retired    Social History Main Topics  . Smoking status: Never Smoker  . Smokeless tobacco: Never Used  . Alcohol use 1.2 oz/week    2 Glasses of wine per week     Comment: social/ drinks wine(white)  . Drug use: No  . Sexual activity: Not on file   Other Topics Concern  . 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           Past Surgical History:  Procedure Laterality Date  . COLONOSCOPY  2008   3 internal  hemorrhoids, diverticulosis;Dr.Jacobs  . PROSTATE BIOPSY     x 2, once abnormal, once normal  . TONSILLECTOMY      Family History  Problem Relation Age of Onset  . Lung cancer Maternal Grandfather     enviromental   . Heart attack Paternal Uncle   . Prostate cancer Brother     Allergies  Allergen Reactions  . Amoxicillin-Pot Clavulanate     REACTION: diarrhea/night sweats    Current Outpatient Prescriptions on File Prior to Visit  Medication Sig Dispense Refill  . AMBULATORY NON FORMULARY MEDICATION Medication Name: Neck Traction Device, DX: Cervical Discomfort/irritation 1 each 0  . amitriptyline (ELAVIL) 25 MG tablet at bedtime as needed. 1/2 qhs prn for LBP    . cyclobenzaprine (FLEXERIL) 5 MG tablet Take 1 tablet (5 mg total) by mouth 3 (three) times daily as needed for muscle spasms. 30 tablet 6  . finasteride (PROSCAR) 5 MG tablet Take 5 mg by mouth daily.    . fluticasone (FLONASE) 50 MCG/ACT nasal spray Place 1 spray into both nostrils 2 (two) times daily as needed for rhinitis. 16 g 11  . gabapentin (NEURONTIN) 100 MG capsule Take 2 capsules (200 mg total) by mouth at bedtime. 60 capsule 1  .  hydrochlorothiazide (HYDRODIURIL) 25 MG tablet TAKE ONE TABLET BY MOUTH ONCE DAILY 30 tablet 3  . hyoscyamine (ANASPAZ) 0.125 MG TBDP disintergrating tablet Place 1 tablet (0.125 mg total) under the tongue every 4 (four) hours as needed. 30 tablet 1  . losartan (COZAAR) 50 MG tablet TAKE ONE TABLET BY MOUTH ONCE DAILY 90 tablet 3  . meloxicam (MOBIC) 7.5 MG tablet 1 tablet twice daily as needed.    . montelukast (SINGULAIR) 10 MG tablet Take 1 tablet (10 mg total) by mouth at bedtime. (Patient taking differently: Take 10 mg by mouth as needed. ) 30 tablet 3  . traMADol (ULTRAM) 50 MG tablet TAKE 1 TABLET BY MOUTH EVERY 6 HOURS AS NEEDED 30 tablet 1   No current facility-administered medications on file prior to visit.     BP 118/60 (BP Location: Left Arm, Patient Position: Sitting,  Cuff Size: Normal)   Temp 97.9 F (36.6 C) (Oral)   Ht 5\' 8"  (1.727 m)   Wt 227 lb 6.4 oz (103.1 kg)   BMI 34.58 kg/m       Objective:   Physical Exam  Constitutional: He is oriented to person, place, and time. He appears well-developed and well-nourished. No distress.  Cardiovascular: Normal rate, regular rhythm, normal heart sounds and intact distal pulses.  Exam reveals no gallop and no friction rub.   No murmur heard. Pulmonary/Chest: Effort normal and breath sounds normal. No respiratory distress. He has no wheezes. He has no rales. He exhibits no tenderness.  Abdominal: Soft. Bowel sounds are normal. He exhibits no distension and no mass. There is no tenderness. There is no rebound and no guarding. A hernia is present. Hernia confirmed positive in the left inguinal area. Hernia confirmed negative in the right inguinal area.  Umbilical hernia noted as well   Genitourinary: Left testis shows no tenderness. Left testis is descended. Cremasteric reflex is not absent on the left side.  Musculoskeletal: Normal range of motion. He exhibits no edema, tenderness or deformity.  Lymphadenopathy:       Right: No inguinal adenopathy present.       Left: No inguinal adenopathy present.  Neurological: He is alert and oriented to person, place, and time.  Skin: Skin is warm and dry. No rash noted. He is not diaphoretic. No erythema. No pallor.  Psychiatric: He has a normal mood and affect. His behavior is normal. Judgment and thought content normal.  Nursing note and vitals reviewed.     Assessment & Plan:  1. Unilateral recurrent inguinal hernia without obstruction or gangrene - I will refer to general surgery for further evaluation for possible mesh repair of left inguinal hernia - Ambulatory referral to General Surgery - Return precautions given   Dorothyann Peng, NP

## 2016-04-19 DIAGNOSIS — K420 Umbilical hernia with obstruction, without gangrene: Secondary | ICD-10-CM | POA: Diagnosis not present

## 2016-04-19 DIAGNOSIS — K4090 Unilateral inguinal hernia, without obstruction or gangrene, not specified as recurrent: Secondary | ICD-10-CM | POA: Diagnosis not present

## 2016-04-23 ENCOUNTER — Other Ambulatory Visit: Payer: Self-pay | Admitting: Adult Health

## 2016-04-23 DIAGNOSIS — I10 Essential (primary) hypertension: Secondary | ICD-10-CM

## 2016-04-25 ENCOUNTER — Encounter: Payer: Self-pay | Admitting: Adult Health

## 2016-04-25 ENCOUNTER — Ambulatory Visit (INDEPENDENT_AMBULATORY_CARE_PROVIDER_SITE_OTHER): Payer: Medicare Other | Admitting: Adult Health

## 2016-04-25 VITALS — BP 150/78 | Temp 98.6°F | Wt 225.8 lb

## 2016-04-25 DIAGNOSIS — K429 Umbilical hernia without obstruction or gangrene: Secondary | ICD-10-CM | POA: Diagnosis not present

## 2016-04-25 NOTE — Progress Notes (Signed)
Subjective:    Patient ID: Todd Larson, male    DOB: 22-Apr-1940, 76 y.o.   MRN: [id number]  HPI  76 year old male who presents to the office today to discuss his hernia surgery. He was advised by GS that he should go ahead and have surgery to repair his hernias. He would like to have a colonoscopy done prior to the surgery. He is over due for a colonoscopy by about 2 months.   He received a reminder that he was due but has not called to schedule an appointment yet    Review of Systems See HPI   Past Medical History:  Diagnosis Date  . Bulging discs     thoracic spine  . Diverticulosis of colon   . GERD (gastroesophageal reflux disease)   . Hyperlipidemia   . Hypertension   . Inguinal hernia   . OSA (obstructive sleep apnea)   . PSA elevation    History of   . Recurrent sinus infections     Social History   Social History  . Marital status: Single    Spouse name: N/A  . Number of children: 0  . Years of education: N/A   Occupational History  . retired    Social History Main Topics  . Smoking status: Never Smoker  . Smokeless tobacco: Never Used  . Alcohol use 1.2 oz/week    2 Glasses of wine per week     Comment: social/ drinks wine(white)  . Drug use: No  . Sexual activity: Not on file   Other Topics Concern  . 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           Past Surgical History:  Procedure Laterality Date  . COLONOSCOPY  2008   3 internal hemorrhoids, diverticulosis;Dr.Jacobs  . PROSTATE BIOPSY     x 2, once abnormal, once normal  . TONSILLECTOMY      Family History  Problem Relation Age of Onset  . Lung cancer Maternal Grandfather     enviromental   . Heart attack Paternal Uncle   . Prostate cancer Brother     Allergies  Allergen Reactions  . Amoxicillin-Pot Clavulanate     REACTION: diarrhea/night sweats    Current Outpatient  Prescriptions on File Prior to Visit  Medication Sig Dispense Refill  . AMBULATORY NON FORMULARY MEDICATION Medication Name: Neck Traction Device, DX: Cervical Discomfort/irritation 1 each 0  . amitriptyline (ELAVIL) 25 MG tablet at bedtime as needed. 1/2 qhs prn for LBP    . cyclobenzaprine (FLEXERIL) 5 MG tablet Take 1 tablet (5 mg total) by mouth 3 (three) times daily as needed for muscle spasms. 30 tablet 6  . finasteride (PROSCAR) 5 MG tablet Take 5 mg by mouth daily.    . fluticasone (FLONASE) 50 MCG/ACT nasal spray Place 1 spray into both nostrils 2 (two) times daily as needed for rhinitis. 16 g 11  . gabapentin (NEURONTIN) 100 MG capsule Take 2 capsules (200 mg total) by mouth at bedtime. 60 capsule 1  . hydrochlorothiazide (HYDRODIURIL) 25 MG tablet TAKE ONE TABLET BY MOUTH ONCE DAILY 30 tablet 2  . hyoscyamine (ANASPAZ) 0.125 MG TBDP disintergrating tablet Place 1 tablet (0.125 mg total) under the tongue every 4 (four) hours as needed. 30 tablet 1  . losartan (COZAAR) 50 MG  tablet TAKE ONE TABLET BY MOUTH ONCE DAILY 90 tablet 3  . meloxicam (MOBIC) 7.5 MG tablet 1 tablet twice daily as needed.    . montelukast (SINGULAIR) 10 MG tablet Take 1 tablet (10 mg total) by mouth at bedtime. (Patient taking differently: Take 10 mg by mouth as needed. ) 30 tablet 3  . traMADol (ULTRAM) 50 MG tablet TAKE 1 TABLET BY MOUTH EVERY 6 HOURS AS NEEDED 30 tablet 1   No current facility-administered medications on file prior to visit.     BP (!) 150/78 (BP Location: Left Arm, Patient Position: Sitting, Cuff Size: Normal)   Temp 98.6 F (37 C) (Oral)   Wt 225 lb 12.8 oz (102.4 kg)   BMI 34.33 kg/m       Objective:   Physical Exam  Constitutional: He is oriented to person, place, and time. He appears well-developed and well-nourished. No distress.  Neurological: He is alert and oriented to person, place, and time.  Skin: Skin is warm and dry. No rash noted. He is not diaphoretic. No erythema. No  pallor.  Psychiatric: He has a normal mood and affect. His behavior is normal. Thought content normal.  Nursing note and vitals reviewed.     Assessment & Plan:  1. Umbilical hernia without obstruction and without gangrene - Alper was advised that this is not a surgical emergency at this time. I am ok with him getting a colonoscopy prior to hernia surgery.  - He will call and schedule his colonoscopy   Dorothyann Peng, NP

## 2016-06-01 ENCOUNTER — Telehealth: Payer: Self-pay | Admitting: Gastroenterology

## 2016-06-01 NOTE — Telephone Encounter (Signed)
Pt has been scheduled to see Dr Ardis Hughs to discuss his new cervical issues prior to recall colon

## 2016-07-09 ENCOUNTER — Ambulatory Visit (INDEPENDENT_AMBULATORY_CARE_PROVIDER_SITE_OTHER): Payer: Medicare Other | Admitting: Gastroenterology

## 2016-07-09 ENCOUNTER — Encounter: Payer: Self-pay | Admitting: Gastroenterology

## 2016-07-09 VITALS — BP 118/70 | HR 68 | Ht 68.0 in | Wt 223.4 lb

## 2016-07-09 DIAGNOSIS — Z1211 Encounter for screening for malignant neoplasm of colon: Secondary | ICD-10-CM

## 2016-07-09 NOTE — Progress Notes (Signed)
Review of pertinent gastrointestinal problems: 1. Routine risk for colon cancer, colonoscopy 2008 Dr. Ardis Hughs done for routine screening found diverticulosis and hemorrhoids. No polyps. He was recommended to have repeat colon cancer screening at 10 year interval.   HPI: This is a  very pleasant 76 year old man  who was referred to me by Dorothyann Peng, NP  to evaluate  colon cancer screening .    Chief complaint is colon cancer screening in setting of abdominal and inguinal hernia, also cervical spine issues  I performed a colonoscopy for him about 10 years ago. He is due for repeat colon cancer screening around now. He really has no issues with his bowels that are new or concerning. Specifically no overt GI bleeding, no significant changes in his bowels. His weight has been stable.  He's been concerned because he has a umbilical hernia that is protruding and left inguinal hernia.  His left testicle will often rise through the hernia. He is hoping to get those repaired and is artery seen a surgeon about it. He also has cervical spine issues that are gradually causing worse neck kyphosis. He does say he has trouble laying on his left side as well. He doesn't think it would be impossible however.      Review of systems: Pertinent positive and negative review of systems were noted in the above HPI section. All other review negative.   Past Medical History:  Diagnosis Date  . Bulging discs     thoracic spine  . Diverticulosis of colon   . GERD (gastroesophageal reflux disease)   . Hyperlipidemia   . Hypertension   . Inguinal hernia   . OSA (obstructive sleep apnea)   . PSA elevation    History of   . Recurrent sinus infections     Past Surgical History:  Procedure Laterality Date  . COLONOSCOPY  2008   3 internal hemorrhoids, diverticulosis;Dr.Jacobs  . PROSTATE BIOPSY     x 2, once abnormal, once normal  . TONSILLECTOMY      Current Outpatient Prescriptions  Medication Sig  Dispense Refill  . AMBULATORY NON FORMULARY MEDICATION Medication Name: Neck Traction Device, DX: Cervical Discomfort/irritation 1 each 0  . amitriptyline (ELAVIL) 25 MG tablet at bedtime as needed. 1/2 qhs prn for LBP    . cyclobenzaprine (FLEXERIL) 5 MG tablet Take 1 tablet (5 mg total) by mouth 3 (three) times daily as needed for muscle spasms. 30 tablet 6  . finasteride (PROSCAR) 5 MG tablet Take 5 mg by mouth daily.    . fluticasone (FLONASE) 50 MCG/ACT nasal spray Place 1 spray into both nostrils 2 (two) times daily as needed for rhinitis. 16 g 11  . gabapentin (NEURONTIN) 100 MG capsule Take 2 capsules (200 mg total) by mouth at bedtime. 60 capsule 1  . hydrochlorothiazide (HYDRODIURIL) 25 MG tablet TAKE ONE TABLET BY MOUTH ONCE DAILY 30 tablet 2  . hyoscyamine (ANASPAZ) 0.125 MG TBDP disintergrating tablet Place 1 tablet (0.125 mg total) under the tongue every 4 (four) hours as needed. 30 tablet 1  . losartan (COZAAR) 50 MG tablet TAKE ONE TABLET BY MOUTH ONCE DAILY 90 tablet 3  . meloxicam (MOBIC) 7.5 MG tablet 1 tablet twice daily as needed.    . montelukast (SINGULAIR) 10 MG tablet Take 1 tablet (10 mg total) by mouth at bedtime. (Patient taking differently: Take 10 mg by mouth as needed. ) 30 tablet 3  . traMADol (ULTRAM) 50 MG tablet TAKE 1 TABLET BY MOUTH EVERY  6 HOURS AS NEEDED 30 tablet 1   No current facility-administered medications for this visit.     Allergies as of 07/09/2016 - Review Complete 07/09/2016  Allergen Reaction Noted  . Amoxicillin-pot clavulanate      Family History  Problem Relation Age of Onset  . Lung cancer Maternal Grandfather        enviromental   . Heart attack Paternal Uncle   . Prostate cancer Brother     Social History   Social History  . Marital status: Single    Spouse name: N/A  . Number of children: 0  . Years of education: N/A   Occupational History  . retired    Social History Main Topics  . Smoking status: Never Smoker  .  Smokeless tobacco: Never Used  . Alcohol use 1.2 oz/week    2 Glasses of wine per week     Comment: social/ drinks wine(white)  . Drug use: No  . Sexual activity: Not on file   Other Topics Concern  . 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            Physical Exam: BP 118/70   Pulse 68   Ht 5\' 8"  (1.727 m)   Wt 223 lb 6 oz (101.3 kg)   BMI 33.96 kg/m  Constitutional: generally well-appearing Psychiatric: alert and oriented x3 Eyes: extraocular movements intact Mouth: oral pharynx moist, no lesions Neck: supple no lymphadenopathy Cardiovascular: heart regular rate and rhythm Lungs: clear to auscultation bilaterally Abdomen: soft, nontender, + reducible small periumbilical hernia, nondistended, no obvious ascites, no peritoneal signs, normal bowel sounds Extremities: no lower extremity edema bilaterally Skin: no lesions on visible extremities   Assessment and plan: 76 y.o. male with  Routine risk for colon cancer, cervical spine kyphosis, trouble laying on his left side, inguinal hernia and periumbilical hernia  We discussed colonoscopy, he has difficulty laying on his left side, is also concerned about potential nerve impingement that occurs periodically due to his cervical spine issues. These are all reasonable concerns. We can probably work around them and I think colonoscopy would likely go smoothly for him however I did mention that there are other guideline approved methods for colon cancer screening including Colo guard. He understands that this is stool based test with very good sensitivity to detect. I thought it would probably be a very good alternative way to screen for colon cancer compared to colonoscopy with his rigidity or concerns for colonoscopy. He agreed and we will proceed with Colo guard. If it is positive he understands that we will likely then proceed with  colonoscopy. If that is the case I would get Osvaldo Angst to review his cervical spine issues advised her to be safest if he is done at [facility] compared to our outpatient endoscopy center here in the building.    Please see the "Patient Instructions" section for addition details about the plan.   Owens Loffler, MD [facility] 07/09/2016, 10:01 AM  Cc: Dorothyann Peng, NP

## 2016-07-09 NOTE — Patient Instructions (Addendum)
Cologuard colon cancer screening test.  If this is positive, will work on colonoscopy for colon cancer screening (after addressing neck, spine concerns, may have to be at [facility]).  Normal BMI (Body Mass Index- based on height and weight) is between 23 and 30. Your BMI today is Body mass index is 33.96 kg/m. Marland Kitchen Please consider follow up  regarding your BMI with your Primary Care Provider.

## 2016-07-18 DIAGNOSIS — Z1212 Encounter for screening for malignant neoplasm of rectum: Secondary | ICD-10-CM | POA: Diagnosis not present

## 2016-07-18 DIAGNOSIS — Z1211 Encounter for screening for malignant neoplasm of colon: Secondary | ICD-10-CM | POA: Diagnosis not present

## 2016-07-25 IMAGING — CR DG THORACIC SPINE 2V
2 series · 2 of 2 positions shown · non-contrast
Comparison: None.

CLINICAL DATA: Back pain

EXAM:
THORACIC SPINE - 2 VIEW

[view not recorded (1 of 2)]
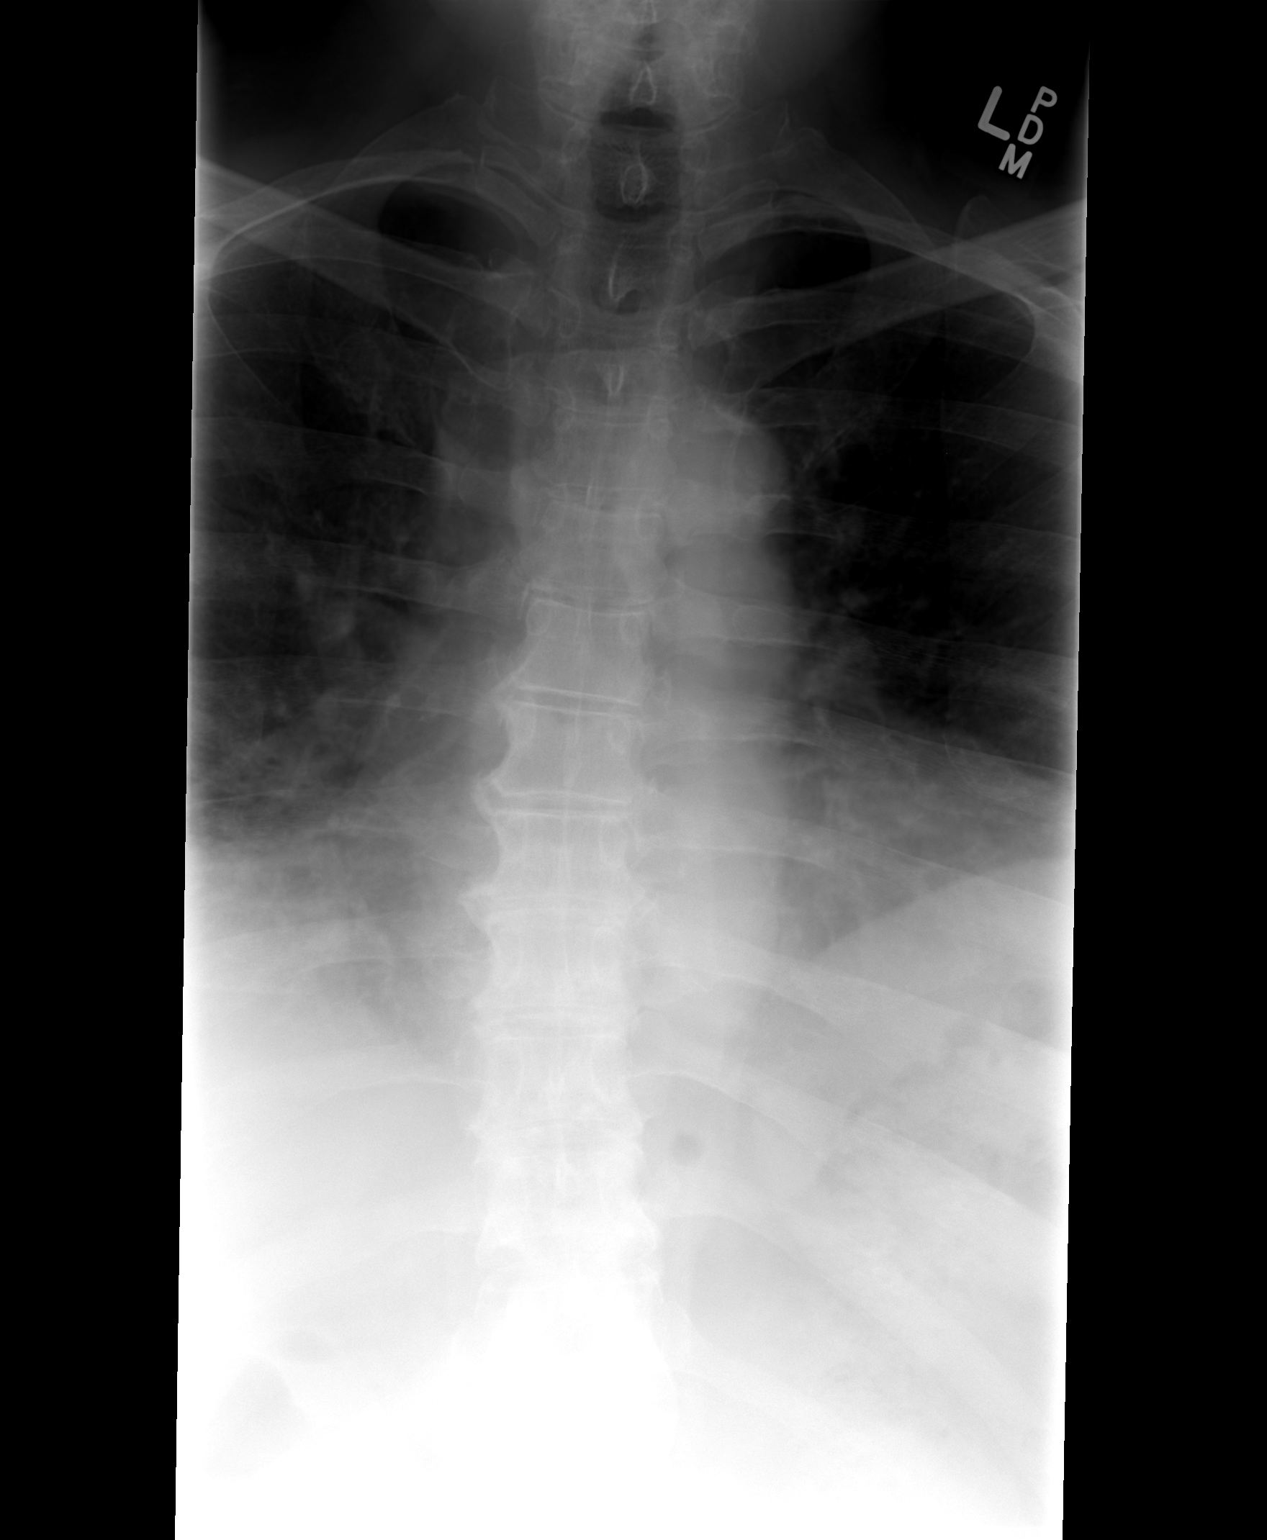

[view not recorded (2 of 2)]
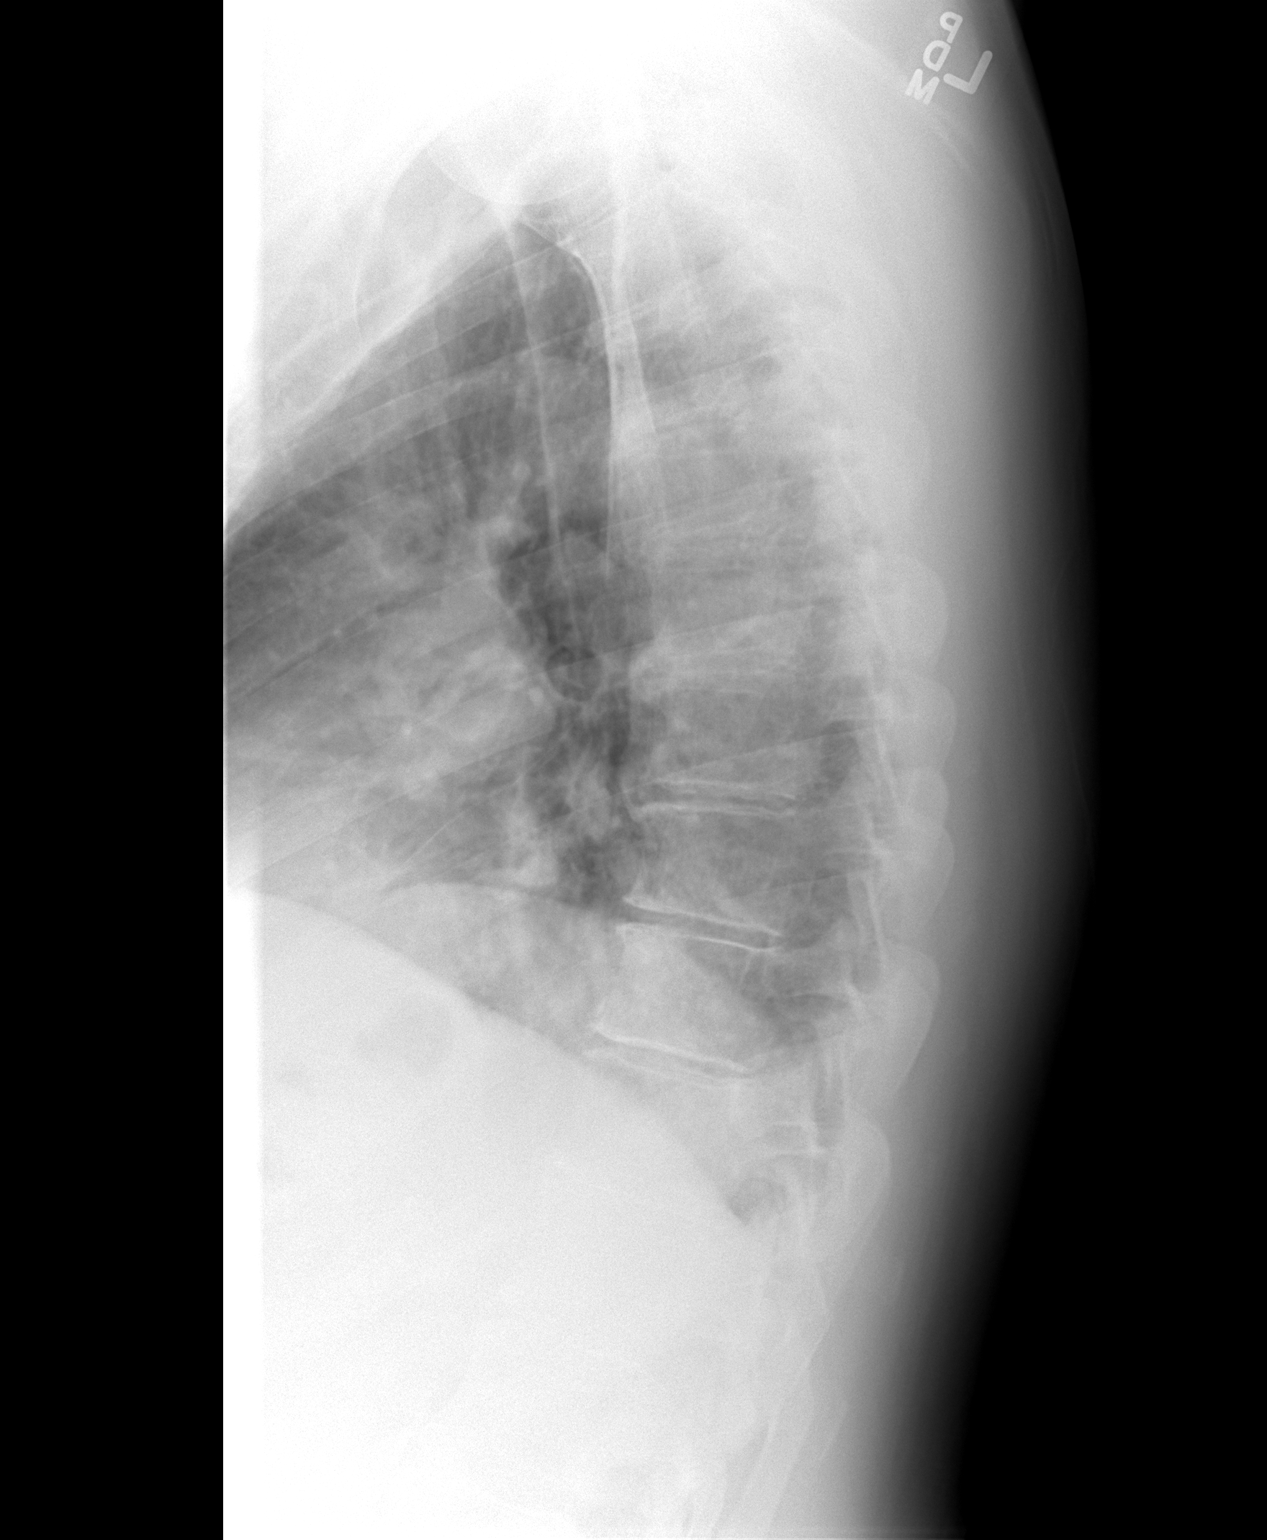

[2 of 2 positions shown; findings below may reference images not displayed]

FINDINGS: Frontal and lateral views were obtained. There is lower thoracic
dextroscoliosis. No fracture or spondylolisthesis. There is disc
space narrowing at multiple levels. There are multiple anterior
right-sided osteophytes. No erosive change.
IMPRESSION: Scoliosis and osteoarthritic change. No fracture or
spondylolisthesis.

## 2016-07-26 ENCOUNTER — Other Ambulatory Visit: Payer: Self-pay

## 2016-07-26 LAB — COLOGUARD: Cologuard: NEGATIVE

## 2016-07-28 ENCOUNTER — Other Ambulatory Visit: Payer: Self-pay | Admitting: Adult Health

## 2016-07-28 DIAGNOSIS — I10 Essential (primary) hypertension: Secondary | ICD-10-CM

## 2016-07-31 ENCOUNTER — Encounter: Payer: Self-pay | Admitting: Adult Health

## 2016-08-07 IMAGING — CR DG ORBITS FOR FOREIGN BODY
2 series · 2 of 2 positions shown · non-contrast
Comparison: None.

CLINICAL DATA: Metal working/exposure; clearance prior to MRI

EXAM:
ORBITS FOR FOREIGN BODY - 2 VIEW

[view not recorded (1 of 2)]
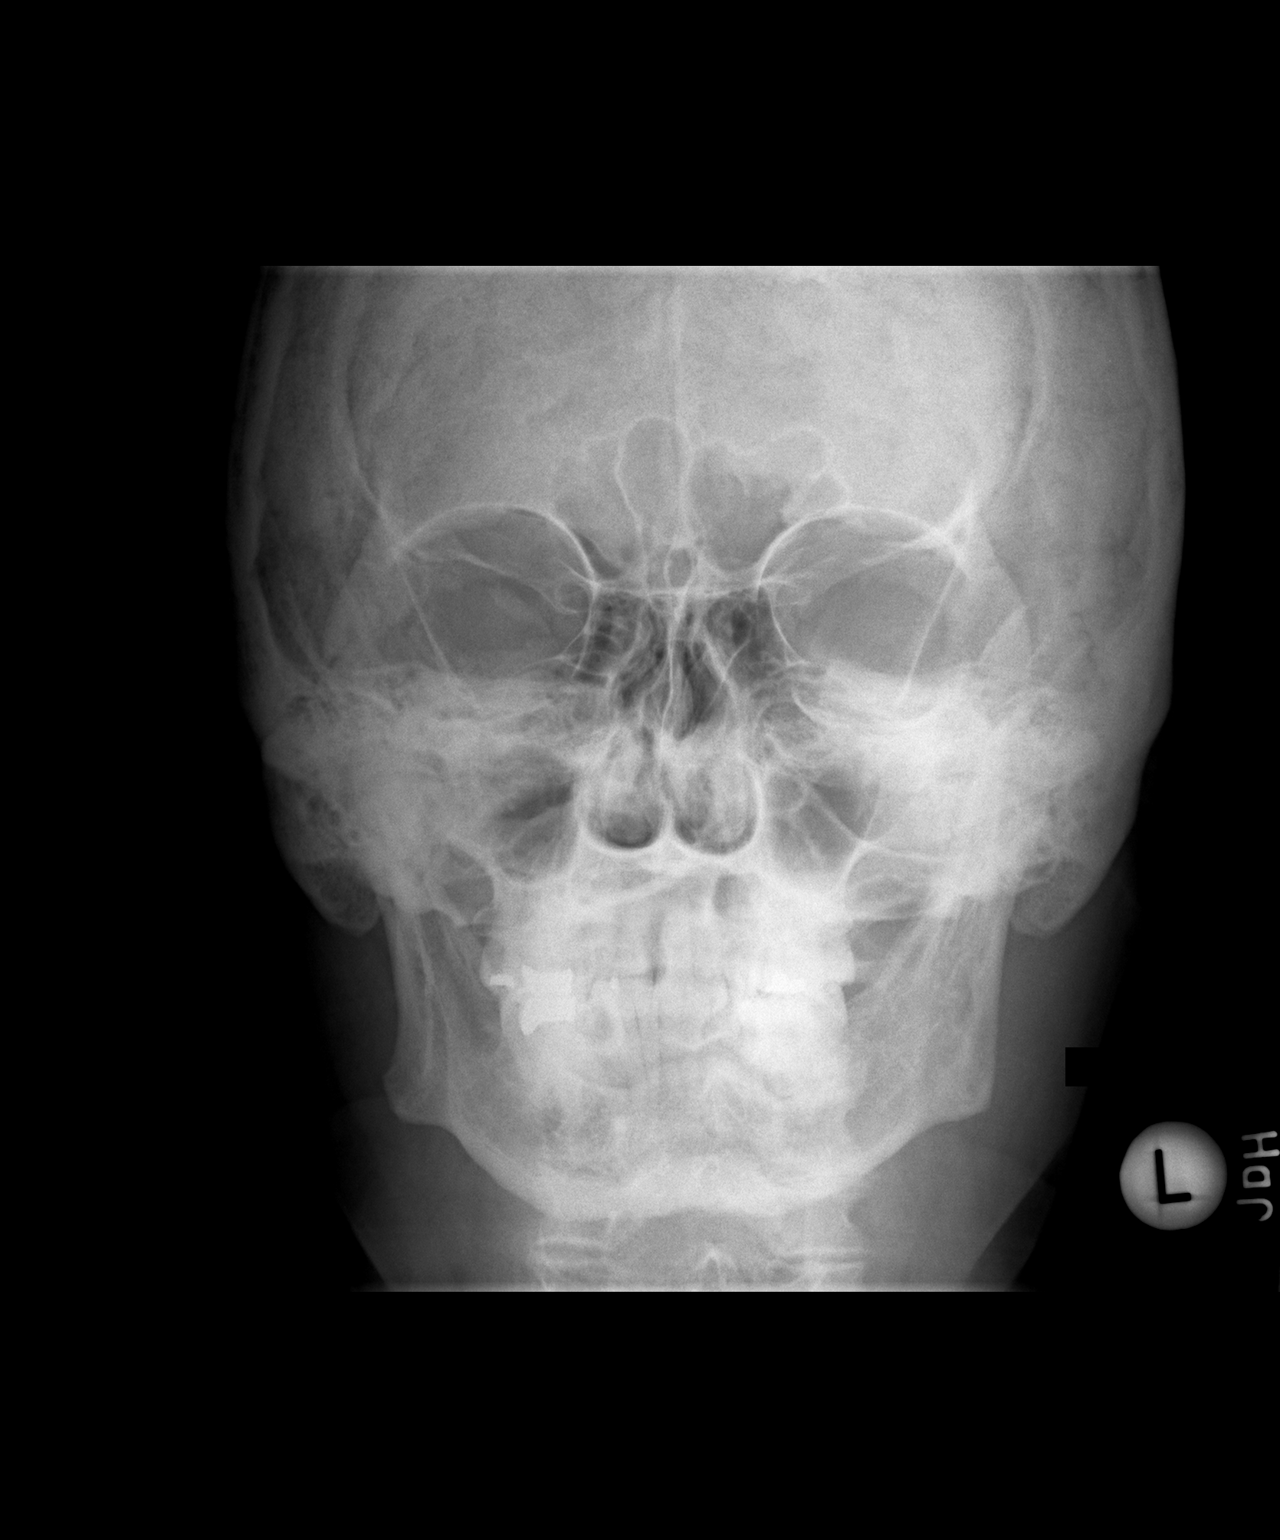

[view not recorded (2 of 2)]
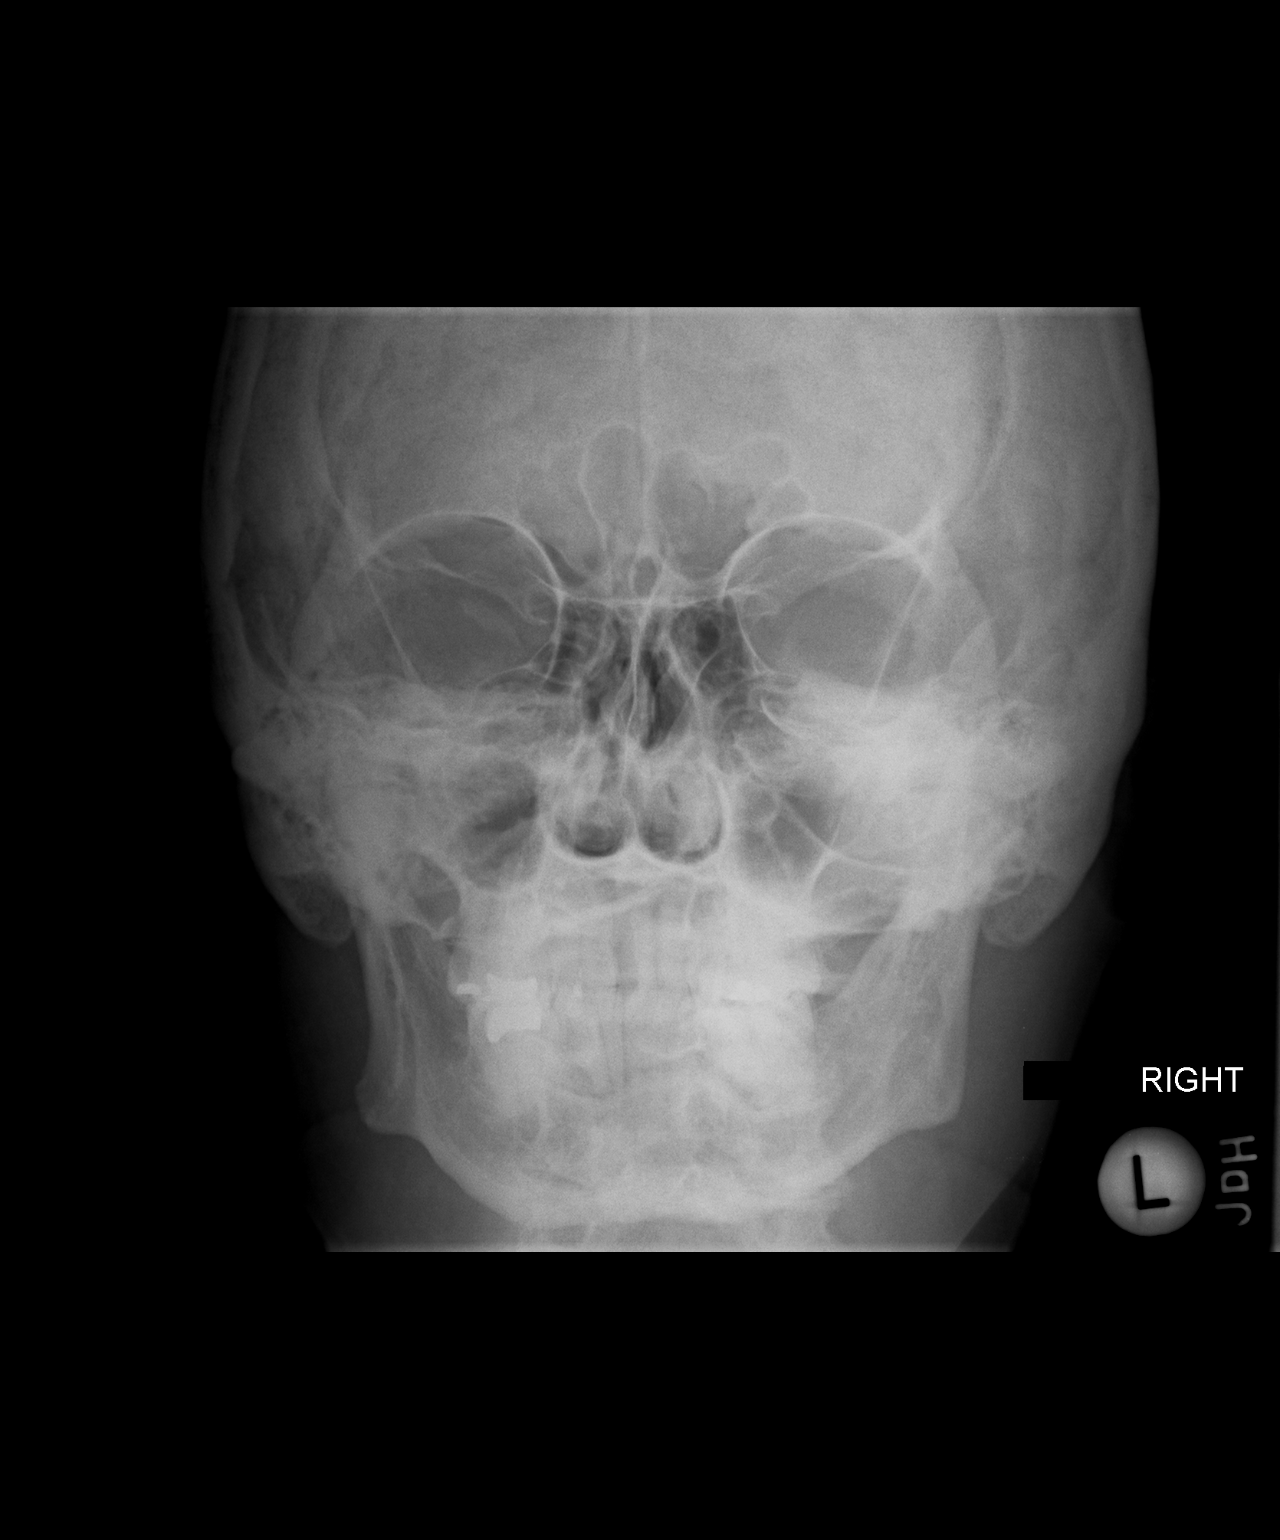

[2 of 2 positions shown; findings below may reference images not displayed]

FINDINGS: Water's views with eyes deviated toward the left and toward the
right were obtained. No intraorbital radiopaque foreign body. No
fracture or dislocation. Paranasal sinuses are clear.
IMPRESSION: No evidence of metallic foreign body within the orbits.

## 2016-08-07 IMAGING — MR MR CERVICAL SPINE W/O CM
5 series · 28 of 48 positions shown · non-contrast
Comparison: None.

CLINICAL DATA: Neck, left shoulder and arm pain for 2-3 weeks. No
known injury.

EXAM:
MRI CERVICAL SPINE WITHOUT CONTRAST
TECHNIQUE: Multiplanar, multisequence MR imaging of the cervical spine was
performed. No intravenous contrast was administered.

[Series 3: T2 · sagittal · 3.0mm · 0.66mm/px · 6 of 12 slices shown (1 of 2)]
[im 1/12]
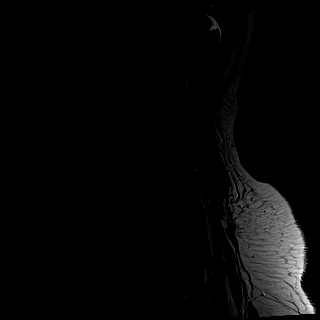
[im 3/12]
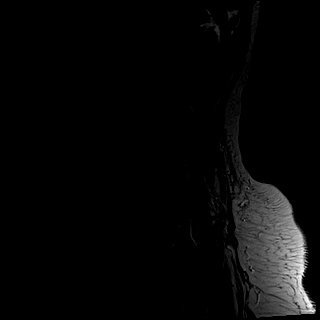
[im 5/12]
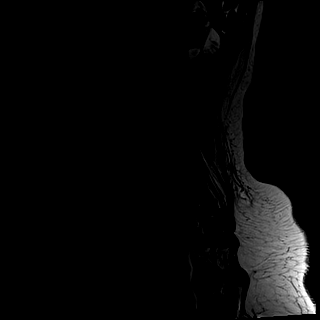
[im 7/12]
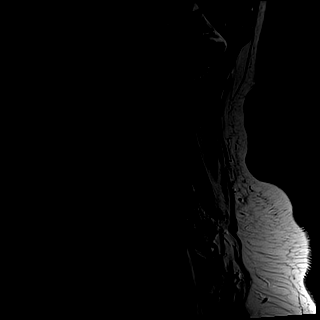
[im 9/12]
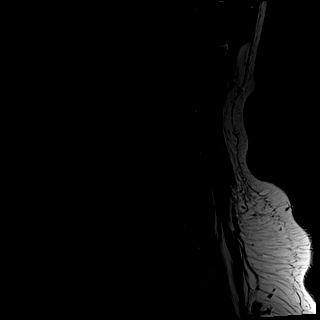
[im 12/12]
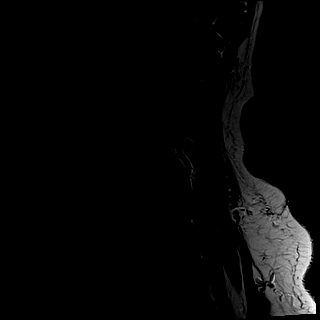

[Series 4: T1 · sagittal · 3.0mm · 0.41mm/px · 6 of 12 slices shown]
[im 1/12]
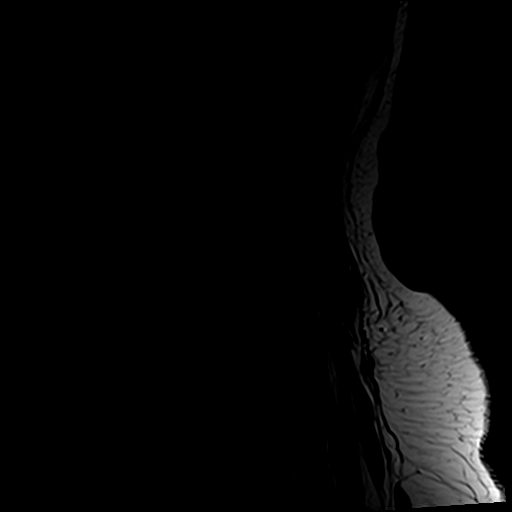
[im 3/12]
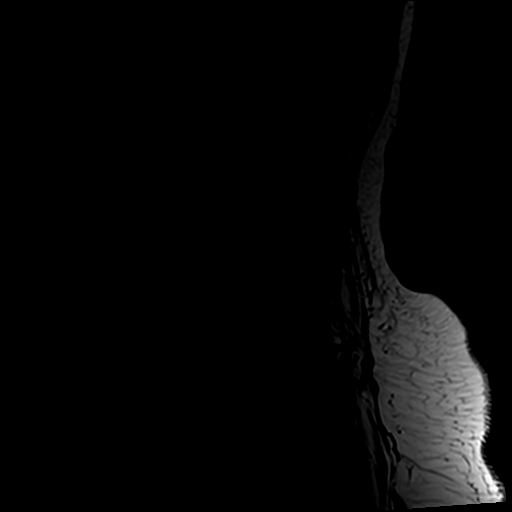
[im 5/12]
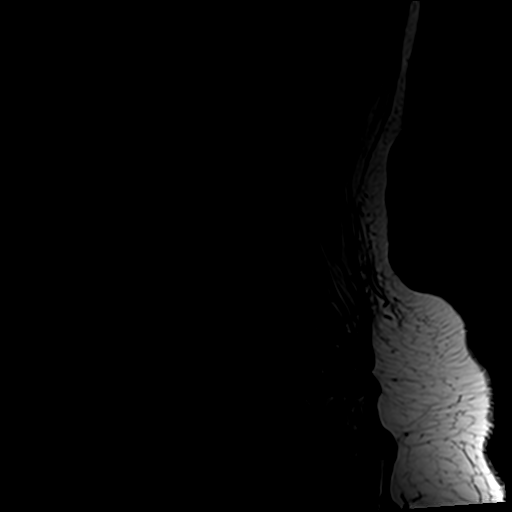
[im 7/12]
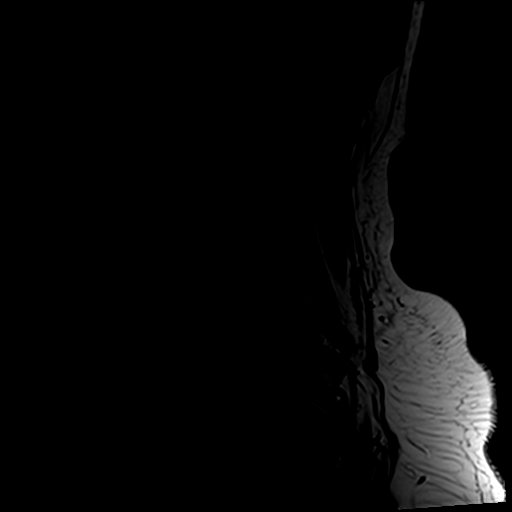
[im 9/12]
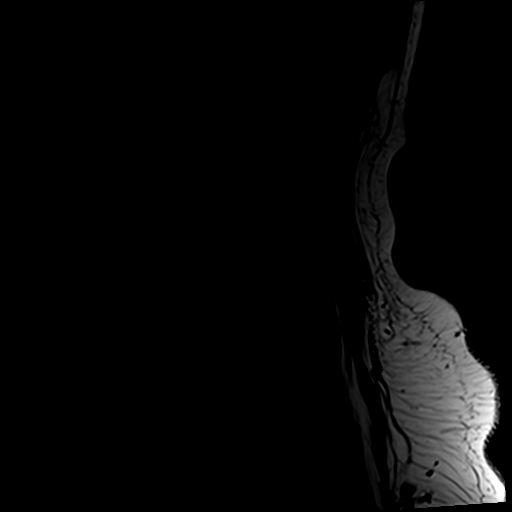
[im 12/12]
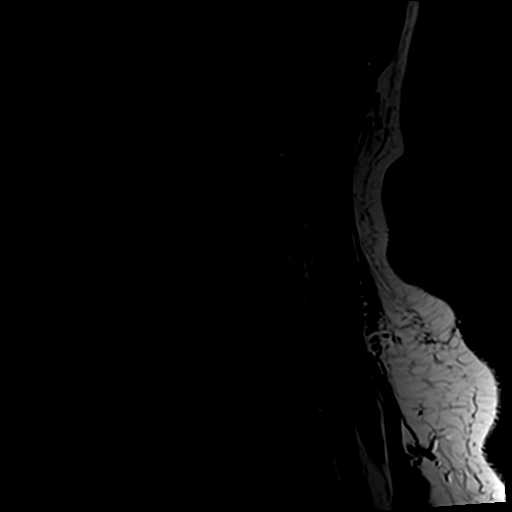

[Series 5: tir sag · sagittal · 3.0mm · 0.41mm/px · 6 of 12 slices shown]
[im 1/12]
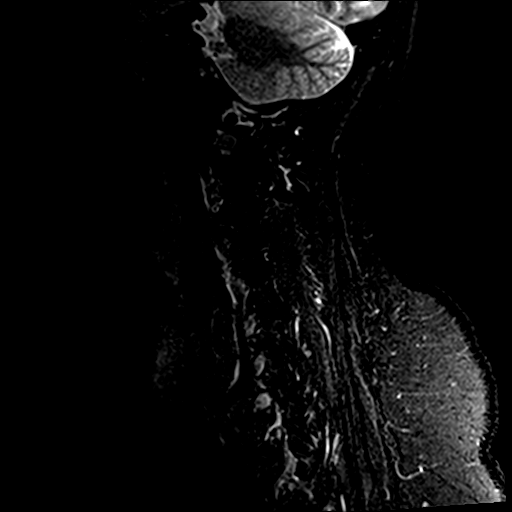
[im 3/12]
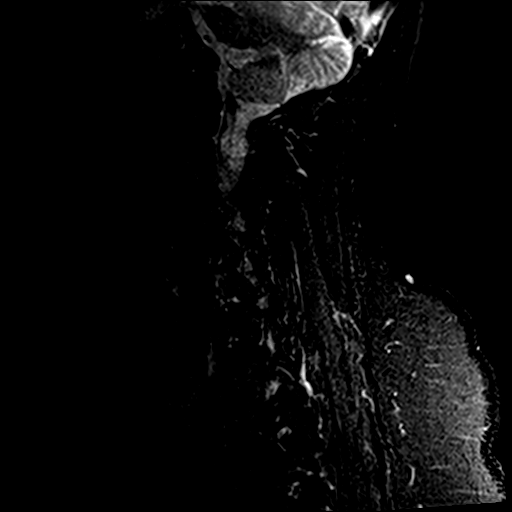
[im 5/12]
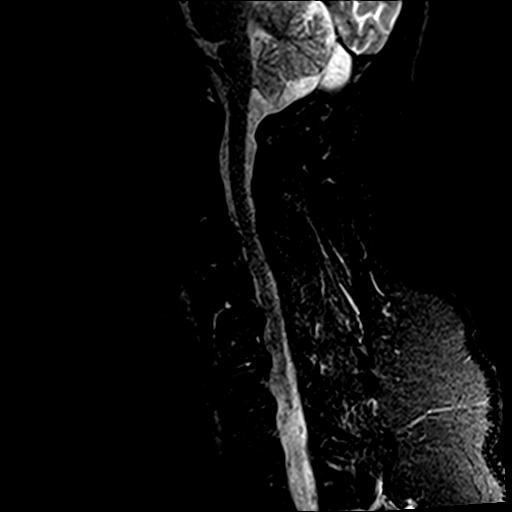
[im 7/12]
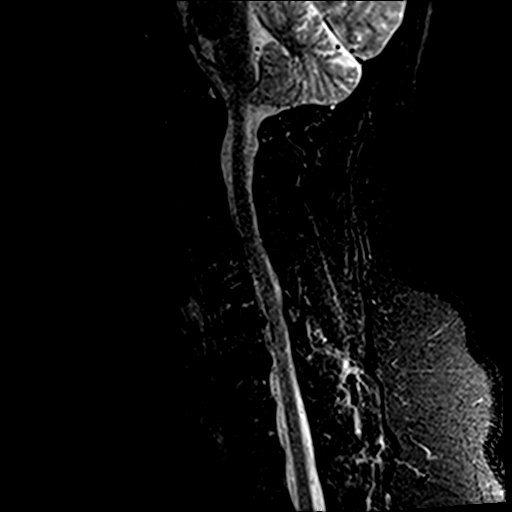
[im 9/12]
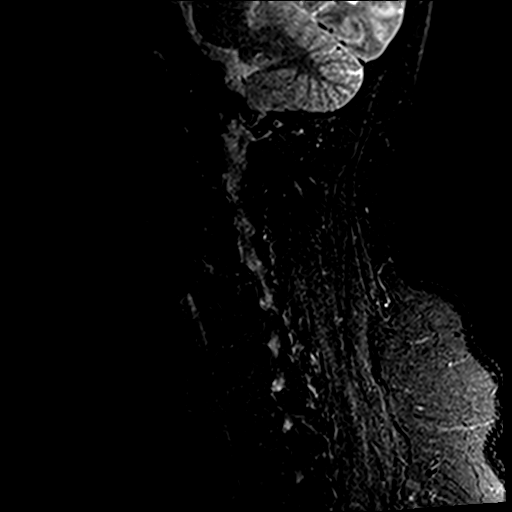
[im 12/12]
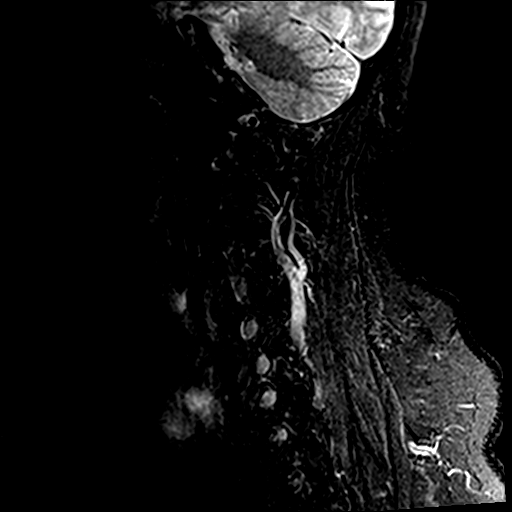

[Series 6: GRE · axial · 3.0mm · 0.35mm/px · 1 of 28 slices shown]
[im 1/28]
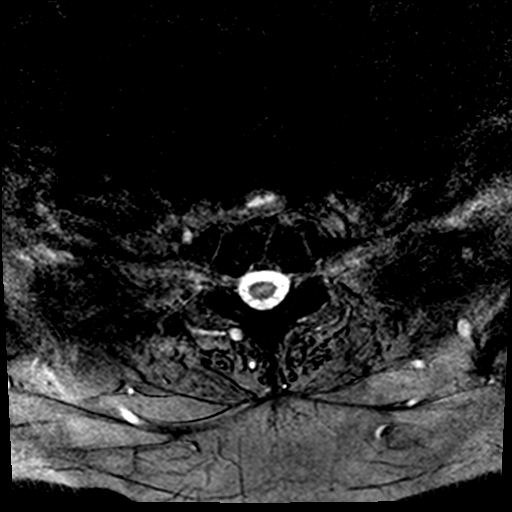

[Series 7: T2 · axial · 3.0mm · 0.70mm/px · z∈[-84,+12]mm · 9 of 28 slices shown (2 of 2)]
[im 1/28]
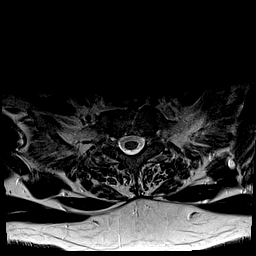
[im 4/28]
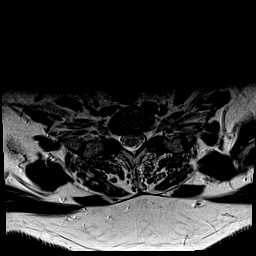
[im 8/28]
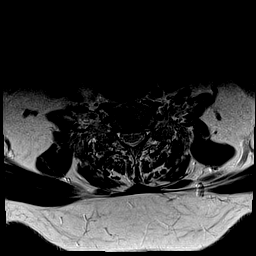
[im 12/28]
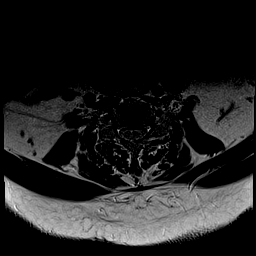
[im 14/28]
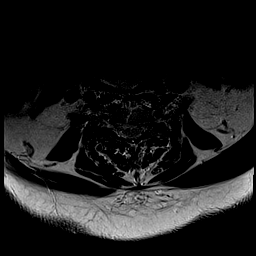
[im 16/28]
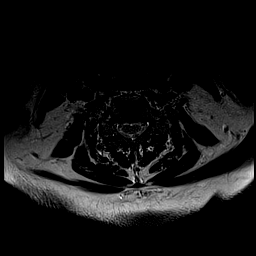
[im 20/28]
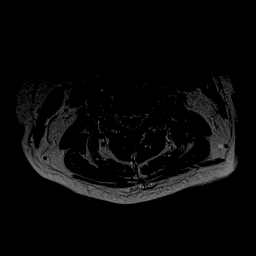
[im 24/28]
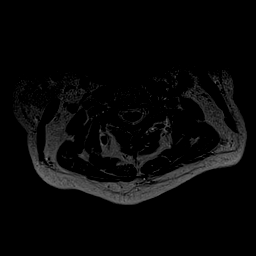
[im 28/28]
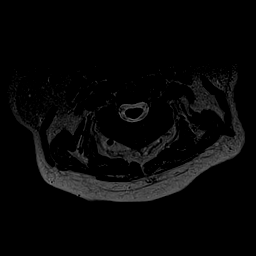

[28 of 48 positions shown; findings below may reference images not displayed]

FINDINGS: There is mild kyphosis of the cervical spine centered about the C4-5
level. Vertebral body height and signal are unremarkable. The
craniocervical junction is normal and cervical cord signal is
normal. Imaged paraspinous structures appear normal.

C2-3: There is facet arthropathy. No disc bulge or protrusion. The
central canal and foramina are open.

C3-4: Shallow disc bulge and mild facet degenerative disease
eccentric to the left. Mild to moderate foraminal narrowing is more
notable on the left. The central canal is open.

C4-5: Minimal disc bulge and mild uncovertebral disease. The central
canal is open. Mild foraminal narrowing is identified.

C5-6: The patient has a disc osteophyte complex and some
uncovertebral disease. There is mild central canal and moderate to
moderately severe foraminal narrowing, worse on the left.

C6-7: Shallow disc bulge and endplate spur identified and there is
some uncovertebral disease. The central canal is open. Mild to
moderate foraminal narrowing is more notable on the left.

C7-T1:  Negative.
IMPRESSION: Spondylosis most notable at C5-6 where there is mild to moderate
central canal narrowing and uncovertebral disease causes moderate to
moderately severe foraminal narrowing, worse on the left.

## 2016-08-21 ENCOUNTER — Encounter: Payer: Self-pay | Admitting: Adult Health

## 2016-08-21 ENCOUNTER — Ambulatory Visit (INDEPENDENT_AMBULATORY_CARE_PROVIDER_SITE_OTHER): Payer: Medicare Other | Admitting: Adult Health

## 2016-08-21 VITALS — BP 126/64 | Temp 98.4°F | Ht 68.0 in | Wt 226.0 lb

## 2016-08-21 DIAGNOSIS — M25571 Pain in right ankle and joints of right foot: Secondary | ICD-10-CM | POA: Diagnosis not present

## 2016-08-21 NOTE — Progress Notes (Signed)
Subjective:    Patient ID: Todd Larson, male    DOB: March 19, 1940, 77 y.o.   MRN: [id number]  HPI  76 year old male who  has a past medical history of Bulging discs; Diverticulosis of colon; GERD (gastroesophageal reflux disease); Hyperlipidemia; Hypertension; Inguinal hernia; OSA (obstructive sleep apnea); PSA elevation; and Recurrent sinus infections. He presents to the office today for the complaint of " left ankle pain". He reports that he has had a " soreness" in his right ankle for the last two weeks. Discomfort is worse at night when he is trying to go to bed. Denies any pain with weight bearing, redness, warmth, or trauma.    Review of Systems  Constitutional: Negative.   Respiratory: Negative.   Cardiovascular: Negative.   Genitourinary: Negative.   Musculoskeletal: Positive for arthralgias. Negative for gait problem.  Skin: Negative.   Neurological: Negative.   All other systems reviewed and are negative.  Past Medical History:  Diagnosis Date  . Bulging discs     thoracic spine  . Diverticulosis of colon   . GERD (gastroesophageal reflux disease)   . Hyperlipidemia   . Hypertension   . Inguinal hernia   . OSA (obstructive sleep apnea)   . PSA elevation    History of   . Recurrent sinus infections     Social History   Social History  . Marital status: Single    Spouse name: N/A  . Number of children: 0  . Years of education: N/A   Occupational History  . retired    Social History Main Topics  . Smoking status: Never Smoker  . Smokeless tobacco: Never Used  . Alcohol use 1.2 oz/week    2 Glasses of wine per week     Comment: social/ drinks wine(white)  . Drug use: No  . Sexual activity: Not on file   Other Topics Concern  . 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           Past Surgical History:  Procedure Laterality Date  . COLONOSCOPY  2008   3  internal hemorrhoids, diverticulosis;Dr.Jacobs  . PROSTATE BIOPSY     x 2, once abnormal, once normal  . TONSILLECTOMY      Family History  Problem Relation Age of Onset  . Lung cancer Maternal Grandfather        enviromental   . Heart attack Paternal Uncle   . Prostate cancer Brother     Allergies  Allergen Reactions  . Amoxicillin-Pot Clavulanate     REACTION: diarrhea/night sweats    Current Outpatient Prescriptions on File Prior to Visit  Medication Sig Dispense Refill  . finasteride (PROSCAR) 5 MG tablet Take 5 mg by mouth daily.    . hydrochlorothiazide (HYDRODIURIL) 25 MG tablet TAKE 1 TABLET BY MOUTH ONCE DAILY 90 tablet 2  . losartan (COZAAR) 50 MG tablet TAKE ONE TABLET BY MOUTH ONCE DAILY 90 tablet 3  . amitriptyline (ELAVIL) 25 MG tablet at bedtime as needed. 1/2 qhs prn for LBP    . cyclobenzaprine (FLEXERIL) 5 MG tablet Take 1 tablet (5 mg total) by mouth 3 (three) times daily as needed for muscle spasms. (Patient not taking: Reported on 08/21/2016) 30 tablet 6  . fluticasone (FLONASE) 50 MCG/ACT nasal spray Place 1 spray into both nostrils 2 (  two) times daily as needed for rhinitis. (Patient not taking: Reported on 08/21/2016) 16 g 11  . gabapentin (NEURONTIN) 100 MG capsule Take 2 capsules (200 mg total) by mouth at bedtime. (Patient not taking: Reported on 08/21/2016) 60 capsule 1  . hyoscyamine (ANASPAZ) 0.125 MG TBDP disintergrating tablet Place 1 tablet (0.125 mg total) under the tongue every 4 (four) hours as needed. (Patient not taking: Reported on 08/21/2016) 30 tablet 1  . meloxicam (MOBIC) 7.5 MG tablet 1 tablet twice daily as needed.    . montelukast (SINGULAIR) 10 MG tablet Take 1 tablet (10 mg total) by mouth at bedtime. (Patient not taking: Reported on 08/21/2016) 30 tablet 3  . traMADol (ULTRAM) 50 MG tablet TAKE 1 TABLET BY MOUTH EVERY 6 HOURS AS NEEDED (Patient not taking: Reported on 08/21/2016) 30 tablet 1   No current facility-administered medications on  file prior to visit.     BP 126/64   Temp 98.4 F (36.9 C) (Oral)   Ht 5\' 8"  (1.727 m)   Wt 226 lb (102.5 kg)   BMI 34.36 kg/m       Objective:   Physical Exam  Constitutional: He is oriented to person, place, and time. He appears well-developed and well-nourished. No distress.  Cardiovascular: Normal rate, regular rhythm, normal heart sounds and intact distal pulses.  Exam reveals no gallop and no friction rub.   No murmur heard. Pulmonary/Chest: Effort normal and breath sounds normal. No respiratory distress. He has no wheezes. He has no rales. He exhibits no tenderness.  Musculoskeletal: Normal range of motion. He exhibits tenderness (slight tenderness with palpation to right lateral malleolus ). He exhibits no edema or deformity.  No redness warmth or swelling   Neurological: He is alert and oriented to person, place, and time.  Skin: Skin is warm and dry. No rash noted. No erythema. No pallor.  Psychiatric: He has a normal mood and affect. His behavior is normal. Judgment and thought content normal.  Nursing note and vitals reviewed.     Assessment & Plan:  1. Acute right ankle pain - Appears to be arthritic pain.  - Advised motrin 400 mg every 8 hours  - Ice and elevation  - Follow up as needed  Dorothyann Peng, NP

## 2016-09-21 ENCOUNTER — Encounter: Payer: Self-pay | Admitting: Adult Health

## 2016-09-24 ENCOUNTER — Ambulatory Visit: Payer: Self-pay | Admitting: General Surgery

## 2016-09-24 DIAGNOSIS — K420 Umbilical hernia with obstruction, without gangrene: Secondary | ICD-10-CM | POA: Diagnosis not present

## 2016-09-24 DIAGNOSIS — K4090 Unilateral inguinal hernia, without obstruction or gangrene, not specified as recurrent: Secondary | ICD-10-CM | POA: Diagnosis not present

## 2016-09-24 NOTE — H&P (Signed)
History of Present Illness Ralene Ok MD; 09/24/2016 4:25 PM) The patient is a 76 year old male who presents with an inguinal hernia. The patient is a 76 year old male who is referred by Sallee Provencal , NP for evaluation of a left inguinal hernia. Patient states he's there is a left inguinal hernia for approximate 5+ years. Patient will states he's noticed a umbilical hernia for approximately the same time. Patient states that when he was he had a hockey incident which he had a Puck to the groin. He states that thereafter he had some swelling in result in a high riding left testicle. He states that when the hernia gets inflamed this appears to be riding up into his inguinal canal. He states that he does have some constipation as well as change in bowel habits when the hernia protrudes.  Patient's had no signs or symptoms of incarceration or strangulation from the umbilical cord the left inguinal hernia.   Allergies (Tanisha A. Owens Shark, [place]; 09/24/2016 4:01 PM) Augmentin *PENICILLINS*  Diarrhea, Vomiting. Allergies Reconciled   Medication History (Tanisha A. Owens Shark, RMA; 09/24/2016 4:01 PM) Fluticasone Propionate (50MCG/ACT Suspension, Nasal) Active. Gabapentin (100MG  Capsule, Oral) Active. Finasteride (5MG  Tablet, Oral) Active. HydroCHLOROthiazide (25MG  Tablet, Oral) Active. Losartan Potassium (50MG  Tablet, Oral) Active. Elavil (25MG  Tablet, Oral) Active. Flexeril (5MG  Tablet, Oral) Active. Anaspaz (0.125MG  Tablet, Oral) Active. Mobic (7.5MG  Tablet, Oral) Active. Singulair (10MG  Tablet, Oral) Active. TraMADol HCl (50MG  Tablet, Oral) Active. Medications Reconciled    Review of Systems Ralene Ok MD; 09/24/2016 4:25 PM) General Not Present- Appetite Loss, Chills, Fatigue, Fever, Night Sweats, Weight Gain and Weight Loss. Skin Not Present- Change in Wart/Mole, Dryness, Hives, Jaundice, New Lesions, Non-Healing Wounds, Rash and Ulcer. HEENT Present- Seasonal  Allergies. Not Present- Earache, Hearing Loss, Hoarseness, Nose Bleed, Oral Ulcers, Ringing in the Ears, Sinus Pain, Sore Throat, Visual Disturbances, Wears glasses/contact lenses and Yellow Eyes. Respiratory Present- Snoring. Not Present- Bloody sputum, Chronic Cough, Difficulty Breathing and Wheezing. Breast Not Present- Breast Mass, Breast Pain, Nipple Discharge and Skin Changes. Cardiovascular Not Present- Chest Pain, Difficulty Breathing Lying Down, Leg Cramps, Palpitations, Rapid Heart Rate, Shortness of Breath and Swelling of Extremities. Gastrointestinal Present- Change in Bowel Habits. Not Present- Abdominal Pain, Bloating, Bloody Stool, Chronic diarrhea, Constipation, Difficulty Swallowing, Excessive gas, Gets full quickly at meals, Hemorrhoids, Indigestion, Nausea, Rectal Pain and Vomiting. Male Genitourinary Not Present- Blood in Urine, Change in Urinary Stream, Frequency, Impotence, Nocturia, Painful Urination, Urgency and Urine Leakage. Musculoskeletal Present- Back Pain. Not Present- Joint Pain, Joint Stiffness, Muscle Pain, Muscle Weakness and Swelling of Extremities. Neurological Not Present- Decreased Memory, Fainting, Headaches, Numbness, Seizures, Tingling, Tremor, Trouble walking and Weakness. Psychiatric Not Present- Anxiety, Bipolar, Change in Sleep Pattern, Depression, Fearful and Frequent crying. Endocrine Not Present- Cold Intolerance, Excessive Hunger, Hair Changes, Heat Intolerance and New Diabetes. Hematology Not Present- Blood Thinners, Easy Bruising, Excessive bleeding, Gland problems, HIV and Persistent Infections. All other systems negative  Vitals (Tanisha A. Brown RMA; 09/24/2016 4:01 PM) 09/24/2016 4:01 PM Weight: 226.6 lb Height: 68in Body Surface Area: 2.16 m Body Mass Index: 34.45 kg/m  Temp.: 97.38F  Pulse: 105 (Regular)  BP: 142/86 (Sitting, Left Arm, Standard)       Physical Exam Ralene Ok, MD; 09/24/2016 4:26 PM) General Mental  Status-Alert. General Appearance-Consistent with stated age. Hydration-Well hydrated. Voice-Normal.  Head and Neck Head-normocephalic, atraumatic with no lesions or palpable masses. Trachea-midline.  Eye Eyeball - Bilateral-Extraocular movements intact. Sclera/Conjunctiva - Bilateral-No scleral icterus.  Chest and Lung Exam  Chest and lung exam reveals -quiet, even and easy respiratory effort with no use of accessory muscles. Inspection Chest Wall - Normal. Back - normal.  Cardiovascular Cardiovascular examination reveals -normal heart sounds, regular rate and rhythm with no murmurs.  Abdomen Inspection Skin - Scar - no surgical scars. Hernias - Umbilical hernia - Incarcerated. Inguinal hernia - Left - Reducible. Palpation/Percussion Normal exam - Soft, Non Tender, No Rebound tenderness, No Rigidity (guarding) and No hepatosplenomegaly. Auscultation Normal exam - Bowel sounds normal.  Neurologic Neurologic evaluation reveals -alert and oriented x 3 with no impairment of recent or remote memory. Mental Status-Normal.  Musculoskeletal Normal Exam - Left-Upper Extremity Strength Normal and Lower Extremity Strength Normal. Normal Exam - Right-Upper Extremity Strength Normal, Lower Extremity Weakness.    Assessment & Plan Ralene Ok MD; 09/24/2016 4:26 PM) LEFT INGUINAL HERNIA (K40.90) Impression: 76 year old male with a left inguinal hernia And umbilical hernia. 1. The patient will like to proceed to the operating room for laparoscopic Left inguinal hernia repair with mesh and LAp umbilical hernia repari wtih mesh  2. I discussed with the patient the signs and symptoms of incarceration and strangulation and the need to proceed to the ER should they occur.  3. I discussed with the patient the risks and benefits of the procedure to include but not limited to: Infection, bleeding, damage to surrounding structures, possible need for further  surgery, possible nerve pain, and possible recurrence. The patient was understanding and wishes to proceed.

## 2016-09-25 DIAGNOSIS — R358 Other polyuria: Secondary | ICD-10-CM | POA: Diagnosis not present

## 2016-09-25 DIAGNOSIS — R339 Retention of urine, unspecified: Secondary | ICD-10-CM | POA: Diagnosis not present

## 2016-09-25 DIAGNOSIS — N138 Other obstructive and reflux uropathy: Secondary | ICD-10-CM | POA: Diagnosis not present

## 2016-09-25 DIAGNOSIS — N401 Enlarged prostate with lower urinary tract symptoms: Secondary | ICD-10-CM | POA: Diagnosis not present

## 2016-10-01 ENCOUNTER — Encounter: Payer: Self-pay | Admitting: Family Medicine

## 2016-10-01 ENCOUNTER — Telehealth: Payer: Self-pay

## 2016-10-01 ENCOUNTER — Ambulatory Visit (INDEPENDENT_AMBULATORY_CARE_PROVIDER_SITE_OTHER): Payer: Medicare Other | Admitting: Family Medicine

## 2016-10-01 VITALS — BP 120/68 | Temp 98.9°F | Ht 68.0 in | Wt 226.0 lb

## 2016-10-01 DIAGNOSIS — Z23 Encounter for immunization: Secondary | ICD-10-CM

## 2016-10-01 DIAGNOSIS — R079 Chest pain, unspecified: Secondary | ICD-10-CM

## 2016-10-01 LAB — CBC WITH DIFFERENTIAL/PLATELET
BASOS ABS: 0.1 10*3/uL (ref 0.0–0.1)
Basophils Relative: 0.8 % (ref 0.0–3.0)
EOS PCT: 1.8 % (ref 0.0–5.0)
Eosinophils Absolute: 0.2 10*3/uL (ref 0.0–0.7)
HCT: 52.6 % — ABNORMAL HIGH (ref 39.0–52.0)
Lymphocytes Relative: 22.2 % (ref 12.0–46.0)
Lymphs Abs: 2 10*3/uL (ref 0.7–4.0)
MCHC: 34.3 g/dL (ref 30.0–36.0)
MCV: 90.7 fl (ref 78.0–100.0)
MONO ABS: 0.6 10*3/uL (ref 0.1–1.0)
MONOS PCT: 6.9 % (ref 3.0–12.0)
Neutro Abs: 6.2 10*3/uL (ref 1.4–7.7)
Neutrophils Relative %: 68.3 % (ref 43.0–77.0)
Platelets: 264 10*3/uL (ref 150.0–400.0)
RBC: 5.8 Mil/uL (ref 4.22–5.81)
RDW: 13.2 % (ref 11.5–15.5)
WBC: 9.1 10*3/uL (ref 4.0–10.5)

## 2016-10-01 LAB — HEPATIC FUNCTION PANEL
ALK PHOS: 53 U/L (ref 39–117)
ALT: 30 U/L (ref 0–53)
AST: 20 U/L (ref 0–37)
Albumin: 4.4 g/dL (ref 3.5–5.2)
BILIRUBIN TOTAL: 1.2 mg/dL (ref 0.2–1.2)
Bilirubin, Direct: 0.2 mg/dL (ref 0.0–0.3)
Total Protein: 6.7 g/dL (ref 6.0–8.3)

## 2016-10-01 LAB — BASIC METABOLIC PANEL
BUN: 13 mg/dL (ref 6–23)
CALCIUM: 9.8 mg/dL (ref 8.4–10.5)
CO2: 27 mEq/L (ref 19–32)
Chloride: 100 mEq/L (ref 96–112)
Creatinine, Ser: 0.91 mg/dL (ref 0.40–1.50)
GFR: 86.51 mL/min (ref >60.00)
Glucose, Bld: 109 mg/dL — ABNORMAL HIGH (ref 70–99)
Potassium: 4.3 mEq/L (ref 3.5–5.1)
SODIUM: 139 meq/L (ref 135–145)

## 2016-10-01 LAB — TSH: TSH: 2.14 u[IU]/mL (ref 0.35–4.50)

## 2016-10-01 NOTE — Telephone Encounter (Signed)
Todd Larson @ [facility] called with CRITICAL VALUE Hgb of 18.1  Dr. Sarajane Jews - FYI. Thanks!

## 2016-10-01 NOTE — Progress Notes (Signed)
   Subjective:    Patient ID: Todd Larson, male    DOB: [birth year], 76 y.o.   MRN: [id number]  HPI Here for 2 episodes of chest pain in the past week. Both of these occurred at rest. The pain is described as pressure-like and it last about a minute both times. No SOB or nausea or sweats. No radiation of the pain. He thinks it may be related to his GI system because he has been passing more gas than usual this week. No heartburn or trouble swallowing. He is concerned because his brother was diagnosed with coronary artery disease in his 74's.    Review of Systems  Constitutional: Negative.   Respiratory: Negative.   Cardiovascular: Positive for chest pain. Negative for palpitations and leg swelling.  Gastrointestinal: Negative.   Neurological: Negative.        Objective:   Physical Exam  Constitutional: He is oriented to person, place, and time. He appears well-developed and well-nourished. No distress.  Neck: No thyromegaly present.  Cardiovascular: Normal rate, regular rhythm, normal heart sounds and intact distal pulses.   EKG is unremarkable   Pulmonary/Chest: Effort normal and breath sounds normal. No respiratory distress. He has no wheezes. He has no rales. He exhibits no tenderness.  Abdominal: Soft. Bowel sounds are normal. He exhibits no distension and no mass. There is no tenderness. There is no rebound and no guarding.  Lymphadenopathy:    He has no cervical adenopathy.  Neurological: He is alert and oriented to person, place, and time.          Assessment & Plan:  Two episodes of chest pain that are likely esophageal in origin, but we need to rule out ischemia. We will set up a nuclear treadmill soon.  Alysia Penna, MD

## 2016-10-01 NOTE — Telephone Encounter (Signed)
Noted  

## 2016-10-01 NOTE — Addendum Note (Signed)
Addended by: Aggie Hacker A on: 10/01/2016 11:54 AM   Modules accepted: Orders

## 2016-10-01 NOTE — Patient Instructions (Signed)
WE NOW OFFER   [facility] FAST TRACK!!!  SAME DAY Appointments for ACUTE CARE  Such as: Sprains, Injuries, cuts, abrasions, rashes, muscle pain, joint pain, back pain Colds, flu, sore throats, headache, allergies, cough, fever  Ear pain, sinus and eye infections Abdominal pain, nausea, vomiting, diarrhea, upset stomach Animal/insect bites  3 Easy Ways to Schedule: Walk-In Scheduling Call in scheduling Mychart Sign-up: [email or web address]         

## 2016-10-08 ENCOUNTER — Telehealth: Payer: Self-pay | Admitting: *Deleted

## 2016-10-08 ENCOUNTER — Telehealth (HOSPITAL_COMMUNITY): Payer: Self-pay | Admitting: *Deleted

## 2016-10-08 NOTE — Telephone Encounter (Signed)
Patient given detailed instructions per Myocardial Perfusion Study Information Sheet for the test on 10/10/16 at 0730. Patient notified to arrive 15 minutes early and that it is imperative to arrive on time for appointment to keep from having the test rescheduled.  If you need to cancel or reschedule your appointment, please call the office within 24 hours of your appointment. . Patient verbalized understanding.Todd Larson, Ranae Palms

## 2016-10-08 NOTE — Telephone Encounter (Signed)
Follow Up:     Please call,says he have some more questions about his Stress Test.

## 2016-10-10 ENCOUNTER — Ambulatory Visit (HOSPITAL_COMMUNITY): Payer: Medicare Other | Attending: Cardiology

## 2016-10-10 DIAGNOSIS — I10 Essential (primary) hypertension: Secondary | ICD-10-CM | POA: Insufficient documentation

## 2016-10-10 DIAGNOSIS — I2510 Atherosclerotic heart disease of native coronary artery without angina pectoris: Secondary | ICD-10-CM | POA: Insufficient documentation

## 2016-10-10 DIAGNOSIS — R079 Chest pain, unspecified: Secondary | ICD-10-CM | POA: Diagnosis not present

## 2016-10-10 LAB — MYOCARDIAL PERFUSION IMAGING
CHL CUP NUCLEAR SRS: 5
CHL RATE OF PERCEIVED EXERTION: 18
CSEPPHR: 131 {beats}/min
Estimated workload: 6.4 METS
Exercise duration (min): 4 min
Exercise duration (sec): 30 s
LV dias vol: 48 mL (ref 62–150)
LVSYSVOL: 10 mL
MPHR: 146 {beats}/min
NUC STRESS TID: 0.82
Percent HR: 90 %
RATE: 0.24
Rest HR: 85 {beats}/min
SDS: 0
SSS: 5

## 2016-10-10 MED ORDER — TECHNETIUM TC 99M TETROFOSMIN IV KIT
31.5000 | PACK | Freq: Once | INTRAVENOUS | Status: AC | PRN
  Administered 2016-10-10: 31.5 via INTRAVENOUS
  Filled 2016-10-10: qty 32

## 2016-10-10 MED ORDER — TECHNETIUM TC 99M TETROFOSMIN IV KIT
9.6000 | PACK | Freq: Once | INTRAVENOUS | Status: AC | PRN
  Administered 2016-10-10: 9.6 via INTRAVENOUS
  Filled 2016-10-10: qty 10

## 2016-11-06 NOTE — Pre-Procedure Instructions (Signed)
Tyrique Sporn Diefenderfer  11/06/2016      [address] [place]. [place] [address] Phone: [phone] Fax: [phone]    Your procedure is scheduled on Tuesday, November 13, 2016  Report to [facility] Admitting Entrance "A" at 5:30A.M.  Call this number if you have problems the morning of surgery:  [phone]   Remember:  Do not eat food or drink liquids after midnight.  Take these medicines the morning of surgery with A SIP OF WATER: Finasteride (PROSCAR). If needed Cyclobenzaprine (FLEXERIL) for spasms, (REFRESH OP) for dry eyes, and Fluticasone (FLONASE) for allergies.  As of today, stop taking all Aspirins, Vitamins, Fish oils, and Herbal medications. Also stop all NSAIDS i.e. Advil, Ibuprofen, Motrin, Aleve, Anaprox, Naproxen, BC and Goody Powders.   Do not wear jewelry.  Do not wear lotions, powders, colognes, or deodorant.  Do not shave 48 hours prior to surgery.  Men may shave face and neck.  Do not bring valuables to the hospital.  [facility] is not responsible for any belongings or valuables.  Contacts, dentures or bridgework may not be worn into surgery.  Leave your suitcase in the car.  After surgery it may be brought to your room.  For patients admitted to the hospital, discharge time will be determined by your treatment team.  Patients discharged the day of surgery will not be allowed to drive home.   Special instructions:   [place]- Preparing For Surgery  Before surgery, you can play an important role. Because skin is not sterile, your skin needs to be as free of germs as possible. You can reduce the number of germs on your skin by washing with CHG (chlorahexidine gluconate) Soap before surgery.  CHG is an antiseptic cleaner which kills germs and bonds with the skin to continue killing germs even after washing.  Please do not use if you have an allergy to CHG or antibacterial soaps.  If your skin becomes reddened/irritated stop using the CHG.  Do not shave (including legs and underarms) for at least 48 hours prior to first CHG shower. It is OK to shave your face.  Please follow these instructions carefully.   1. Shower the NIGHT BEFORE SURGERY and the MORNING OF SURGERY with CHG.   2. If you chose to wash your hair, wash your hair first as usual with your normal shampoo.  3. After you shampoo, rinse your hair and body thoroughly to remove the shampoo.  4. Use CHG as you would any other liquid soap. You can apply CHG directly to the skin and wash gently with a scrungie or a clean washcloth.   5. Apply the CHG Soap to your body ONLY FROM THE NECK DOWN.  Do not use on open wounds or open sores. Avoid contact with your eyes, ears, mouth and genitals (private parts). Wash Face and genitals (private parts)  with your normal soap.  6. Wash thoroughly, paying special attention to the area where your surgery will be performed.  7. Thoroughly rinse your body with warm water from the neck down.  8. DO NOT shower/wash with your normal soap after using and rinsing off the CHG Soap.  9. Pat yourself dry with a CLEAN TOWEL.  10. Wear CLEAN PAJAMAS to bed the night before surgery, wear comfortable clothes the morning of surgery  11. Place CLEAN SHEETS on your bed the night of your first shower and DO  NOT SLEEP WITH PETS.  Day of Surgery: Do not apply any deodorants/lotions. Please wear clean clothes to the hospital/surgery center.    Please read over the following fact sheets that you were given. Pain Booklet, Coughing and Deep Breathing and Surgical Site Infection Prevention

## 2016-11-07 ENCOUNTER — Encounter (HOSPITAL_COMMUNITY): Payer: Self-pay

## 2016-11-07 ENCOUNTER — Other Ambulatory Visit: Payer: Self-pay

## 2016-11-07 ENCOUNTER — Encounter (HOSPITAL_COMMUNITY)
Admission: RE | Admit: 2016-11-07 | Discharge: 2016-11-07 | Disposition: A | Discharge status: Home / Self Care | Payer: Medicare Other | Source: Ambulatory Visit | Attending: General Surgery | Admitting: General Surgery

## 2016-11-07 DIAGNOSIS — K429 Umbilical hernia without obstruction or gangrene: Secondary | ICD-10-CM | POA: Insufficient documentation

## 2016-11-07 DIAGNOSIS — Z01812 Encounter for preprocedural laboratory examination: Secondary | ICD-10-CM | POA: Insufficient documentation

## 2016-11-07 DIAGNOSIS — K4090 Unilateral inguinal hernia, without obstruction or gangrene, not specified as recurrent: Secondary | ICD-10-CM | POA: Diagnosis not present

## 2016-11-07 HISTORY — DX: Unspecified osteoarthritis, unspecified site: M19.90

## 2016-11-07 LAB — BASIC METABOLIC PANEL
Anion gap: 7 (ref 5–15)
BUN: 7 mg/dL (ref 6–20)
CHLORIDE: 103 mmol/L (ref 101–111)
CO2: 28 mmol/L (ref 22–32)
CREATININE: 1.06 mg/dL (ref 0.61–1.24)
Calcium: 9.2 mg/dL (ref 8.9–10.3)
GFR calc Af Amer: >60 mL/min (ref >60)
GFR calc non Af Amer: >60 mL/min (ref >60)
Glucose, Bld: 115 mg/dL — ABNORMAL HIGH (ref 65–99)
POTASSIUM: 3.9 mmol/L (ref 3.5–5.1)
Sodium: 138 mmol/L (ref 135–145)

## 2016-11-07 LAB — CBC
HEMATOCRIT: 51.6 % (ref 39.0–52.0)
Hemoglobin: 17.8 g/dL — ABNORMAL HIGH (ref 13.0–17.0)
MCH: 31.6 pg (ref 26.0–34.0)
MCHC: 34.5 g/dL (ref 30.0–36.0)
MCV: 91.5 fL (ref 78.0–100.0)
PLATELETS: 244 10*3/uL (ref 150–400)
RBC: 5.64 MIL/uL (ref 4.22–5.81)
RDW: 13.2 % (ref 11.5–15.5)
WBC: 10.7 10*3/uL — ABNORMAL HIGH (ref 4.0–10.5)

## 2016-11-12 MED ORDER — VANCOMYCIN HCL 10 G IV SOLR
1500.0000 mg | INTRAVENOUS | Status: AC
  Administered 2016-11-13: 1500 mg via INTRAVENOUS
  Filled 2016-11-12: qty 1500

## 2016-11-13 ENCOUNTER — Encounter (HOSPITAL_COMMUNITY)
Admission: RE | Disposition: A | Discharge status: Home / Self Care | Payer: Self-pay | Source: Ambulatory Visit | Attending: General Surgery

## 2016-11-13 ENCOUNTER — Ambulatory Visit (HOSPITAL_COMMUNITY): Payer: Medicare Other | Admitting: Certified Registered"

## 2016-11-13 ENCOUNTER — Encounter (HOSPITAL_COMMUNITY): Payer: Self-pay | Admitting: *Deleted

## 2016-11-13 ENCOUNTER — Ambulatory Visit (HOSPITAL_COMMUNITY): Payer: Medicare Other | Admitting: Emergency Medicine

## 2016-11-13 ENCOUNTER — Ambulatory Visit (HOSPITAL_COMMUNITY)
Admission: RE | Admit: 2016-11-13 | Discharge: 2016-11-13 | Disposition: A | Discharge status: Home / Self Care | Payer: Medicare Other | Source: Ambulatory Visit | Attending: General Surgery | Admitting: General Surgery

## 2016-11-13 DIAGNOSIS — Z7951 Long term (current) use of inhaled steroids: Secondary | ICD-10-CM | POA: Insufficient documentation

## 2016-11-13 DIAGNOSIS — K219 Gastro-esophageal reflux disease without esophagitis: Secondary | ICD-10-CM | POA: Diagnosis not present

## 2016-11-13 DIAGNOSIS — K4090 Unilateral inguinal hernia, without obstruction or gangrene, not specified as recurrent: Secondary | ICD-10-CM | POA: Diagnosis not present

## 2016-11-13 DIAGNOSIS — Z79899 Other long term (current) drug therapy: Secondary | ICD-10-CM | POA: Diagnosis not present

## 2016-11-13 DIAGNOSIS — M1990 Unspecified osteoarthritis, unspecified site: Secondary | ICD-10-CM | POA: Diagnosis not present

## 2016-11-13 DIAGNOSIS — Z880 Allergy status to penicillin: Secondary | ICD-10-CM | POA: Insufficient documentation

## 2016-11-13 DIAGNOSIS — K4030 Unilateral inguinal hernia, with obstruction, without gangrene, not specified as recurrent: Secondary | ICD-10-CM | POA: Diagnosis not present

## 2016-11-13 DIAGNOSIS — I10 Essential (primary) hypertension: Secondary | ICD-10-CM | POA: Diagnosis not present

## 2016-11-13 DIAGNOSIS — K4000 Bilateral inguinal hernia, with obstruction, without gangrene, not specified as recurrent: Secondary | ICD-10-CM | POA: Diagnosis not present

## 2016-11-13 DIAGNOSIS — K5900 Constipation, unspecified: Secondary | ICD-10-CM | POA: Insufficient documentation

## 2016-11-13 DIAGNOSIS — K429 Umbilical hernia without obstruction or gangrene: Secondary | ICD-10-CM | POA: Diagnosis not present

## 2016-11-13 DIAGNOSIS — D176 Benign lipomatous neoplasm of spermatic cord: Secondary | ICD-10-CM | POA: Insufficient documentation

## 2016-11-13 HISTORY — PX: LAPAROSCOPIC INGUINAL HERNIA WITH UMBILICAL HERNIA: SHX5658

## 2016-11-13 HISTORY — PX: INSERTION OF MESH: SHX5868

## 2016-11-13 SURGERY — LAPAROSCOPIC INGUINAL HERNIA WITH UMBILICAL HERNIA
Anesthesia: General | Site: Abdomen

## 2016-11-13 MED ORDER — SODIUM CHLORIDE 0.9% FLUSH: 3.0000 mL | INTRAVENOUS | Status: DC | PRN

## 2016-11-13 MED ORDER — PROPOFOL 10 MG/ML IV BOLUS
INTRAVENOUS | Status: AC
  Filled 2016-11-13: qty 20

## 2016-11-13 MED ORDER — OXYCODONE HCL 5 MG PO TABS
ORAL_TABLET | ORAL | Status: AC
  Administered 2016-11-13: 10 mg via ORAL
  Filled 2016-11-13: qty 2

## 2016-11-13 MED ORDER — ACETAMINOPHEN 325 MG PO TABS
650.0000 mg | ORAL_TABLET | ORAL | Status: DC | PRN
  Administered 2016-11-13: 650 mg via ORAL

## 2016-11-13 MED ORDER — SUCCINYLCHOLINE CHLORIDE 200 MG/10ML IV SOSY
PREFILLED_SYRINGE | INTRAVENOUS | Status: DC | PRN
  Administered 2016-11-13: 120 mg via INTRAVENOUS

## 2016-11-13 MED ORDER — SUGAMMADEX SODIUM 200 MG/2ML IV SOLN
INTRAVENOUS | Status: DC | PRN
  Administered 2016-11-13: 200 mg via INTRAVENOUS

## 2016-11-13 MED ORDER — FENTANYL CITRATE (PF) 250 MCG/5ML IJ SOLN
INTRAMUSCULAR | Status: AC
  Filled 2016-11-13: qty 5

## 2016-11-13 MED ORDER — OXYCODONE-ACETAMINOPHEN 5-325 MG PO TABS: 1.0000 | ORAL_TABLET | ORAL | 0 refills | Status: DC | PRN

## 2016-11-13 MED ORDER — LIDOCAINE 2% (20 MG/ML) 5 ML SYRINGE
INTRAMUSCULAR | Status: AC
  Filled 2016-11-13: qty 5

## 2016-11-13 MED ORDER — MORPHINE SULFATE (PF) 2 MG/ML IV SOLN: 2.0000 mg | INTRAVENOUS | Status: DC | PRN

## 2016-11-13 MED ORDER — BUPIVACAINE HCL (PF) 0.25 % IJ SOLN
INTRAMUSCULAR | Status: AC
  Filled 2016-11-13: qty 30

## 2016-11-13 MED ORDER — FENTANYL CITRATE (PF) 100 MCG/2ML IJ SOLN: 25.0000 ug | INTRAMUSCULAR | Status: DC | PRN

## 2016-11-13 MED ORDER — MIDAZOLAM HCL 2 MG/2ML IJ SOLN
INTRAMUSCULAR | Status: AC
  Filled 2016-11-13: qty 2

## 2016-11-13 MED ORDER — OXYCODONE HCL 5 MG PO TABS
5.0000 mg | ORAL_TABLET | ORAL | Status: DC | PRN
  Administered 2016-11-13: 10 mg via ORAL

## 2016-11-13 MED ORDER — PHENYLEPHRINE 40 MCG/ML (10ML) SYRINGE FOR IV PUSH (FOR BLOOD PRESSURE SUPPORT)
PREFILLED_SYRINGE | INTRAVENOUS | Status: DC | PRN
  Administered 2016-11-13: 80 ug via INTRAVENOUS
  Administered 2016-11-13: 120 ug via INTRAVENOUS

## 2016-11-13 MED ORDER — SODIUM CHLORIDE 0.9% FLUSH: 3.0000 mL | Freq: Two times a day (BID) | INTRAVENOUS | Status: DC

## 2016-11-13 MED ORDER — ROCURONIUM BROMIDE 10 MG/ML (PF) SYRINGE
PREFILLED_SYRINGE | INTRAVENOUS | Status: AC
  Filled 2016-11-13: qty 5

## 2016-11-13 MED ORDER — DEXAMETHASONE SODIUM PHOSPHATE 10 MG/ML IJ SOLN
INTRAMUSCULAR | Status: DC | PRN
  Administered 2016-11-13: 10 mg via INTRAVENOUS

## 2016-11-13 MED ORDER — ACETAMINOPHEN 325 MG PO TABS
ORAL_TABLET | ORAL | Status: AC
  Filled 2016-11-13: qty 2

## 2016-11-13 MED ORDER — SODIUM CHLORIDE 0.9 % IV SOLN: 250.0000 mL | INTRAVENOUS | Status: DC | PRN

## 2016-11-13 MED ORDER — DEXAMETHASONE SODIUM PHOSPHATE 10 MG/ML IJ SOLN
INTRAMUSCULAR | Status: AC
  Filled 2016-11-13: qty 1

## 2016-11-13 MED ORDER — SUCCINYLCHOLINE CHLORIDE 200 MG/10ML IV SOSY
PREFILLED_SYRINGE | INTRAVENOUS | Status: AC
  Filled 2016-11-13: qty 20

## 2016-11-13 MED ORDER — ROCURONIUM BROMIDE 10 MG/ML (PF) SYRINGE
PREFILLED_SYRINGE | INTRAVENOUS | Status: DC | PRN
  Administered 2016-11-13: 60 mg via INTRAVENOUS
  Administered 2016-11-13: 10 mg via INTRAVENOUS

## 2016-11-13 MED ORDER — MORPHINE SULFATE (PF) 4 MG/ML IV SOLN
INTRAVENOUS | Status: AC
  Administered 2016-11-13: 2 mg
  Filled 2016-11-13: qty 1

## 2016-11-13 MED ORDER — LACTATED RINGERS IV SOLN
INTRAVENOUS | Status: DC | PRN
  Administered 2016-11-13 (×2): via INTRAVENOUS

## 2016-11-13 MED ORDER — BUPIVACAINE HCL 0.25 % IJ SOLN
INTRAMUSCULAR | Status: DC | PRN
  Administered 2016-11-13: 8 mL

## 2016-11-13 MED ORDER — 0.9 % SODIUM CHLORIDE (POUR BTL) OPTIME
TOPICAL | Status: DC | PRN
  Administered 2016-11-13: 1000 mL

## 2016-11-13 MED ORDER — FENTANYL CITRATE (PF) 250 MCG/5ML IJ SOLN
INTRAMUSCULAR | Status: DC | PRN
  Administered 2016-11-13 (×3): 50 ug via INTRAVENOUS
  Administered 2016-11-13: 100 ug via INTRAVENOUS

## 2016-11-13 MED ORDER — PHENYLEPHRINE 40 MCG/ML (10ML) SYRINGE FOR IV PUSH (FOR BLOOD PRESSURE SUPPORT)
PREFILLED_SYRINGE | INTRAVENOUS | Status: AC
  Filled 2016-11-13: qty 20

## 2016-11-13 MED ORDER — ONDANSETRON HCL 4 MG/2ML IJ SOLN
INTRAMUSCULAR | Status: DC | PRN
  Administered 2016-11-13: 4 mg via INTRAVENOUS

## 2016-11-13 MED ORDER — PROPOFOL 10 MG/ML IV BOLUS
INTRAVENOUS | Status: DC | PRN
Start: 2016-11-13 — End: 2016-11-13
  Administered 2016-11-13: 180 mg via INTRAVENOUS

## 2016-11-13 MED ORDER — CHLORHEXIDINE GLUCONATE CLOTH 2 % EX PADS: 6.0000 | MEDICATED_PAD | Freq: Once | CUTANEOUS | Status: DC

## 2016-11-13 MED ORDER — ONDANSETRON HCL 4 MG/2ML IJ SOLN
INTRAMUSCULAR | Status: AC
  Filled 2016-11-13: qty 4

## 2016-11-13 MED ORDER — ACETAMINOPHEN 650 MG RE SUPP: 650.0000 mg | RECTAL | Status: DC | PRN

## 2016-11-13 MED ORDER — LIDOCAINE 2% (20 MG/ML) 5 ML SYRINGE
INTRAMUSCULAR | Status: DC | PRN
  Administered 2016-11-13: 100 mg via INTRAVENOUS

## 2016-11-13 SURGICAL SUPPLY — 43 items
BENZOIN TINCTURE PRP APPL 2/3 (GAUZE/BANDAGES/DRESSINGS) ×4 IMPLANT
CANISTER SUCT 3000ML PPV (MISCELLANEOUS) ×4 IMPLANT
CHLORAPREP W/TINT 26ML (MISCELLANEOUS) ×4 IMPLANT
COVER SURGICAL LIGHT HANDLE (MISCELLANEOUS) ×4 IMPLANT
DERMABOND ADVANCED (GAUZE/BANDAGES/DRESSINGS) ×2
DERMABOND ADVANCED .7 DNX12 (GAUZE/BANDAGES/DRESSINGS) ×2 IMPLANT
DEVICE SECURE STRAP 25 ABSORB (INSTRUMENTS) ×8 IMPLANT
DISSECTOR BLUNT TIP ENDO 5MM (MISCELLANEOUS) ×4 IMPLANT
ELECT REM PT RETURN 9FT ADLT (ELECTROSURGICAL) ×4
ELECTRODE REM PT RTRN 9FT ADLT (ELECTROSURGICAL) ×2 IMPLANT
GLOVE BIO SURGEON STRL SZ7 (GLOVE) ×12 IMPLANT
GLOVE BIO SURGEON STRL SZ7.5 (GLOVE) ×4 IMPLANT
GLOVE BIOGEL PI IND STRL 7.0 (GLOVE) ×4 IMPLANT
GLOVE BIOGEL PI INDICATOR 7.0 (GLOVE) ×4
GOWN STRL REUS W/ TWL LRG LVL3 (GOWN DISPOSABLE) ×4 IMPLANT
GOWN STRL REUS W/ TWL XL LVL3 (GOWN DISPOSABLE) ×2 IMPLANT
GOWN STRL REUS W/TWL LRG LVL3 (GOWN DISPOSABLE) ×4
GOWN STRL REUS W/TWL XL LVL3 (GOWN DISPOSABLE) ×2
KIT BASIN OR (CUSTOM PROCEDURE TRAY) ×4 IMPLANT
KIT ROOM TURNOVER OR (KITS) ×4 IMPLANT
MESH 3DMAX 5X7 LT XLRG (Mesh General) ×4 IMPLANT
MESH VENTRALIGHT ST 6IN CRC (Mesh General) ×4 IMPLANT
NEEDLE INSUFFLATION 14GA 120MM (NEEDLE) ×4 IMPLANT
NS IRRIG 1000ML POUR BTL (IV SOLUTION) ×4 IMPLANT
PAD ARMBOARD 7.5X6 YLW CONV (MISCELLANEOUS) ×8 IMPLANT
RELOAD STAPLE HERNIA 4.0 BLUE (INSTRUMENTS) ×4 IMPLANT
RELOAD STAPLE HERNIA 4.8 BLK (STAPLE) IMPLANT
SCISSORS LAP 5X35 DISP (ENDOMECHANICALS) ×4 IMPLANT
SET TROCAR LAP APPLE-HUNT 5MM (ENDOMECHANICALS) ×4 IMPLANT
STAPLER HERNIA 12 8.5 360D (INSTRUMENTS) ×4 IMPLANT
SUT CHROMIC 3 0 SH 27 (SUTURE) ×4 IMPLANT
SUT MNCRL AB 4-0 PS2 18 (SUTURE) ×4 IMPLANT
SUT NOVA 1 T20/GS 25DT (SUTURE) ×4 IMPLANT
SUT PROLENE 2 0 KS (SUTURE) ×8 IMPLANT
SUT VIC AB 1 CT1 27 (SUTURE)
SUT VIC AB 1 CT1 27XBRD ANBCTR (SUTURE) IMPLANT
TOWEL OR 17X24 6PK STRL BLUE (TOWEL DISPOSABLE) ×4 IMPLANT
TOWEL OR 17X26 10 PK STRL BLUE (TOWEL DISPOSABLE) ×4 IMPLANT
TRAY FOLEY CATH SILVER 16FR (SET/KITS/TRAYS/PACK) ×4 IMPLANT
TRAY LAPAROSCOPIC MC (CUSTOM PROCEDURE TRAY) ×4 IMPLANT
TROCAR XCEL 12X100 BLDLESS (ENDOMECHANICALS) ×4 IMPLANT
TROCAR XCEL BLADELESS 5X75MML (TROCAR) ×4 IMPLANT
TUBING INSUFFLATION (TUBING) ×4 IMPLANT

## 2016-11-13 NOTE — Discharge Instructions (Signed)
[facility] _______[facility] ° °HERNIA REPAIR: POST OP INSTRUCTIONS ° °Always review your discharge instruction sheet given to you by the facility where your surgery was performed. °IF YOU HAVE DISABILITY OR FAMILY LEAVE FORMS, YOU MUST BRING THEM TO THE OFFICE FOR PROCESSING.   °DO NOT GIVE THEM TO YOUR DOCTOR. ° °1. A  prescription for pain medication may be given to you upon discharge.  Take your pain medication as prescribed, if needed.  If narcotic pain medicine is not needed, then you may take acetaminophen (Tylenol) or ibuprofen (Advil) as needed. °2. Take your usually prescribed medications unless otherwise directed. °If you need a refill on your pain medication, please contact your pharmacy.  They will contact our office to request authorization. Prescriptions will not be filled after 5 pm or on week-ends. °3. You should follow a light diet the first 24 hours after arrival home, such as soup and crackers, etc.  Be sure to include lots of fluids daily.  Resume your normal diet the day after surgery. °4.Most patients will experience some swelling and bruising around the umbilicus or in the groin and scrotum.  Ice packs and reclining will help.  Swelling and bruising can take several days to resolve.  °6. It is common to experience some constipation if taking pain medication after surgery.  Increasing fluid intake and taking a stool softener (such as Colace) will usually help or prevent this problem from occurring.  A mild laxative (Milk of Magnesia or Miralax) should be taken according to package directions if there are no bowel movements after 48 hours. °7. Unless discharge instructions indicate otherwise, you may remove your bandages 24-48 hours after surgery, and you may shower at that time.  You may have steri-strips (small skin tapes) in place directly over the incision.  These strips should be left on the skin for 7-10 days.  If your surgeon used skin glue on the incision, you may shower in  24 hours.  The glue will flake off over the next 2-3 weeks.  Any sutures or staples will be removed at the office during your follow-up visit. °8. ACTIVITIES:  You may resume regular (light) daily activities beginning the next day--such as daily self-care, walking, climbing stairs--gradually increasing activities as tolerated.  You may have sexual intercourse when it is comfortable.  Refrain from any heavy lifting or straining until approved by your doctor. ° °a.You may drive when you are no longer taking prescription pain medication, you can comfortably wear a seatbelt, and you can safely maneuver your car and apply brakes. °b.RETURN TO WORK:   °_____________________________________________ ° °9.You should see your doctor in the office for a follow-up appointment approximately 2-3 weeks after your surgery.  Make sure that you call for this appointment within a day or two after you arrive home to insure a convenient appointment time. °10.OTHER INSTRUCTIONS: _________________________ °   _____________________________________ ° °WHEN TO CALL YOUR DOCTOR: °1. Fever over 101.0 °2. Inability to urinate °3. Nausea and/or vomiting °4. Extreme swelling or bruising °5. Continued bleeding from incision. °6. Increased pain, redness, or drainage from the incision ° °The clinic staff is available to answer your questions during regular business hours.  Please don’t hesitate to call and ask to speak to one of the nurses for clinical concerns.  If you have a medical emergency, go to the nearest emergency room or call 911.  A surgeon from [facility] is always on call at the hospital ° ° °[address], [address], [place], [place]  [address] ? ° [address] [address], [place], [place]   [address] °[phone] ? [phone] ? FAX [phone] °Web site: [email or web address] ° °

## 2016-11-13 NOTE — Anesthesia Procedure Notes (Signed)
Procedure Name: Intubation Date/Time: 11/13/2016 7:42 AM Performed by: Freddie Breech, CRNA Pre-anesthesia Checklist: Patient identified, Emergency Drugs available, Suction available and Patient being monitored Patient Re-evaluated:Patient Re-evaluated prior to induction Oxygen Delivery Method: Circle System Utilized Preoxygenation: Pre-oxygenation with 100% oxygen Induction Type: IV induction Ventilation: Mask ventilation without difficulty Laryngoscope Size: Glidescope and 4 Grade View: Grade II Tube type: Oral Tube size: 7.5 mm Number of attempts: 1 Airway Equipment and Method: Oral airway,  Video-laryngoscopy and Rigid stylet Placement Confirmation: positive ETCO2 and breath sounds checked- equal and bilateral (ETT inserted under video laryngoscopy.) Secured at: 23 cm Tube secured with: Tape Dental Injury: Teeth and Oropharynx as per pre-operative assessment

## 2016-11-13 NOTE — Anesthesia Postprocedure Evaluation (Signed)
Anesthesia Post Note  Patient: Todd Larson  Procedure(s) Performed: LAPAROSCOPIC LEFT INGUINAL HERNIA  AND UMBILICAL HERNIA REPAIRS WITH MESH (Left Abdomen) INSERTION OF MESH (N/A )     Patient location during evaluation: PACU Anesthesia Type: General Level of consciousness: awake Pain management: pain level controlled Respiratory status: spontaneous breathing Cardiovascular status: stable Anesthetic complications: no    Last Vitals:  Vitals:   11/13/16 0930 11/13/16 1000  BP:  140/83  Pulse: 95 95  Resp: (!) 21 20  Temp:  (!) 36.1 C  SpO2: 94% 99%    Last Pain:  Vitals:   11/13/16 1000  TempSrc:   PainSc: 7                  Kynzli Rease

## 2016-11-13 NOTE — OR Nursing (Signed)
Foley attempt x3 under supervision by Dr. Rosendo Gros.  Verbal order to leave foley out for procedure.

## 2016-11-13 NOTE — Anesthesia Preprocedure Evaluation (Addendum)
Anesthesia Evaluation  Patient identified by MRN, date of birth, ID band Patient awake    Airway Mallampati: II       Dental   Pulmonary    breath sounds clear to auscultation       Cardiovascular hypertension,  Rhythm:Regular Rate:Normal     Neuro/Psych    GI/Hepatic Neg liver ROS, GERD  ,  Endo/Other    Renal/GU      Musculoskeletal  (+) Arthritis ,   Abdominal   Peds  Hematology   Anesthesia Other Findings   Reproductive/Obstetrics                            Anesthesia Physical Anesthesia Plan  ASA: III  Anesthesia Plan: General   Post-op Pain Management:    Induction: Intravenous  PONV Risk Score and Plan: 2 and Treatment may vary due to age or medical condition  Airway Management Planned: Oral ETT  Additional Equipment:   Intra-op Plan:   Post-operative Plan:   Informed Consent: I have reviewed the patients History and Physical, chart, labs and discussed the procedure including the risks, benefits and alternatives for the proposed anesthesia with the patient or authorized representative who has indicated his/her understanding and acceptance.   Dental advisory given  Plan Discussed with: Anesthesiologist and CRNA  Anesthesia Plan Comments:        Anesthesia Quick Evaluation

## 2016-11-13 NOTE — H&P (Signed)
History of Present Illness  The patient is a 76 year old male who presents with an inguinal hernia. The patient is a 76 year old male who is referred by Sallee Provencal , NP for evaluation of a left inguinal hernia. Patient states he's there is a left inguinal hernia for approximate 5+ years. Patient will states he's noticed a umbilical hernia for approximately the same time. Patient states that when he was he had a hockey incident which he had a Puck to the groin. He states that thereafter he had some swelling in result in a high riding left testicle. He states that when the hernia gets inflamed this appears to be riding up into his inguinal canal. He states that he does have some constipation as well as change in bowel habits when the hernia protrudes.  Patient's had no signs or symptoms of incarceration or strangulation from the umbilical cord the left inguinal hernia.   Allergies  Augmentin *PENICILLINS*  Diarrhea, Vomiting. Allergies Reconciled   Medication History  Fluticasone Propionate (50MCG/ACT Suspension, Nasal) Active. Gabapentin (100MG  Capsule, Oral) Active. Finasteride (5MG  Tablet, Oral) Active. HydroCHLOROthiazide (25MG  Tablet, Oral) Active. Losartan Potassium (50MG  Tablet, Oral) Active. Elavil (25MG  Tablet, Oral) Active. Flexeril (5MG  Tablet, Oral) Active. Anaspaz (0.125MG  Tablet, Oral) Active. Mobic (7.5MG  Tablet, Oral) Active. Singulair (10MG  Tablet, Oral) Active. TraMADol HCl (50MG  Tablet, Oral) Active. Medications Reconciled    Review of Systems General Not Present- Appetite Loss, Chills, Fatigue, Fever, Night Sweats, Weight Gain and Weight Loss. Skin Not Present- Change in Wart/Mole, Dryness, Hives, Jaundice, New Lesions, Non-Healing Wounds, Rash and Ulcer. HEENT Present- Seasonal Allergies. Not Present- Earache, Hearing Loss, Hoarseness, Nose Bleed, Oral Ulcers, Ringing in the Ears, Sinus Pain, Sore Throat, Visual Disturbances, Wears  glasses/contact lenses and Yellow Eyes. Respiratory Present- Snoring. Not Present- Bloody sputum, Chronic Cough, Difficulty Breathing and Wheezing. Breast Not Present- Breast Mass, Breast Pain, Nipple Discharge and Skin Changes. Cardiovascular Not Present- Chest Pain, Difficulty Breathing Lying Down, Leg Cramps, Palpitations, Rapid Heart Rate, Shortness of Breath and Swelling of Extremities. Gastrointestinal Present- Change in Bowel Habits. Not Present- Abdominal Pain, Bloating, Bloody Stool, Chronic diarrhea, Constipation, Difficulty Swallowing, Excessive gas, Gets full quickly at meals, Hemorrhoids, Indigestion, Nausea, Rectal Pain and Vomiting. Male Genitourinary Not Present- Blood in Urine, Change in Urinary Stream, Frequency, Impotence, Nocturia, Painful Urination, Urgency and Urine Leakage. Musculoskeletal Present- Back Pain. Not Present- Joint Pain, Joint Stiffness, Muscle Pain, Muscle Weakness and Swelling of Extremities. Neurological Not Present- Decreased Memory, Fainting, Headaches, Numbness, Seizures, Tingling, Tremor, Trouble walking and Weakness. Psychiatric Not Present- Anxiety, Bipolar, Change in Sleep Pattern, Depression, Fearful and Frequent crying. Endocrine Not Present- Cold Intolerance, Excessive Hunger, Hair Changes, Heat Intolerance and New Diabetes. Hematology Not Present- Blood Thinners, Easy Bruising, Excessive bleeding, Gland problems, HIV and Persistent Infections. All other systems negative BP (!) 170/88   Pulse (!) 102   Temp 98.2 F (36.8 C) (Oral)   Resp 19   SpO2 97%     Physical Exam  General Mental Status-Alert. General Appearance-Consistent with stated age. Hydration-Well hydrated. Voice-Normal.  Head and Neck Head-normocephalic, atraumatic with no lesions or palpable masses. Trachea-midline.  Eye Eyeball - Bilateral-Extraocular movements intact. Sclera/Conjunctiva - Bilateral-No scleral icterus.  Chest and Lung Exam Chest  and lung exam reveals -quiet, even and easy respiratory effort with no use of accessory muscles. Inspection Chest Wall - Normal. Back - normal.  Cardiovascular Cardiovascular examination reveals -normal heart sounds, regular rate and rhythm with no murmurs.  Abdomen Inspection Skin - Scar -  no surgical scars. Hernias - Umbilical hernia - Incarcerated. Inguinal hernia - Left - Reducible. Palpation/Percussion Normal exam - Soft, Non Tender, No Rebound tenderness, No Rigidity (guarding) and No hepatosplenomegaly. Auscultation Normal exam - Bowel sounds normal.  Neurologic Neurologic evaluation reveals -alert and oriented x 3 with no impairment of recent or remote memory. Mental Status-Normal.  Musculoskeletal Normal Exam - Left-Upper Extremity Strength Normal and Lower Extremity Strength Normal. Normal Exam - Right-Upper Extremity Strength Normal, Lower Extremity Weakness.    Assessment & Plan  LEFT INGUINAL HERNIA (V36.12) And Umbilical hernia Impression: 76 year old male with a left inguinal hernia And umbilical hernia. 1. The patient will like to proceed to the operating room for laparoscopic Left inguinal hernia repair with mesh and LAp umbilical hernia repari wtih mesh  2. I discussed with the patient the signs and symptoms of incarceration and strangulation and the need to proceed to the ER should they occur.  3. I discussed with the patient the risks and benefits of the procedure to include but not limited to: Infection, bleeding, damage to surrounding structures, possible need for further surgery, possible nerve pain, and possible recurrence. The patient was understanding and wishes to proceed.

## 2016-11-13 NOTE — Transfer of Care (Signed)
Immediate Anesthesia Transfer of Care Note  Patient: Todd Larson  Procedure(s) Performed: LAPAROSCOPIC LEFT INGUINAL HERNIA WITH UMBILICAL HERNIA REPAIR WITH MESH (Left ) INSERTION OF MESH (N/A )  Patient Location: PACU  Anesthesia Type:General  Level of Consciousness: drowsy and patient cooperative  Airway & Oxygen Therapy: Patient Spontanous Breathing and Patient connected to face mask oxygen  Post-op Assessment: Report given to RN and Post -op Vital signs reviewed and stable  Post vital signs: Reviewed and stable  Last Vitals:  Vitals:   11/13/16 0913 11/13/16 0915  BP: (!) 177/99   Pulse: 86 (!) 102  Resp: 20 (!) 23  Temp: 36.4 C   SpO2: 100% 99%    Last Pain:  Vitals:   11/13/16 0913  TempSrc:   PainSc: 5       Patients Stated Pain Goal: 2 (29/47/65 4650)  Complications: No apparent anesthesia complications

## 2016-11-13 NOTE — Op Note (Signed)
11/13/2016  8:58 AM  PATIENT:  Todd Larson  76 y.o. male  PRE-OPERATIVE DIAGNOSIS:  UMBILICAL HERNIA, LEFT INGUINA HERNIA  POST-OPERATIVE DIAGNOSIS:  UMBILICAL HERNIA, INCARCERATED LEFT INGUINA HERNIA  PROCEDURE:  Procedure(s): LAPAROSCOPIC LEFT INGUINAL HERNIA WITH UMBILICAL HERNIA REPAIR WITH MESH (Left) INSERTION OF MESH (N/A)  SURGEON:  Surgeon(s) and Role:    * Ralene Ok, MD - Primary  ANESTHESIA:   local and general  EBL:  10CC   BLOOD ADMINISTERED:none  DRAINS: none   LOCAL MEDICATIONS USED:  BUPIVICAINE   SPECIMEN:  No Specimen  DISPOSITION OF SPECIMEN:  N/A  COUNTS:  YES  TOURNIQUET:  * No tourniquets in log *  DICTATION: .Dragon Dictation   Counts: reported as correct x 2  Findings:  The patient had a small left indirect hernia and large cord lipoma  Indications for procedure:  The patient is a 76 year old male with a left inguinal and umbilical hernia for several months. Patient complained of symptomatology to his left inguinal area and umbilical. The patient was taken back for elective inguinal  And umbilical hernia repair.  Details of the procedure: The patient was taken back to the operating room. The patient was placed in supine position with bilateral SCDs in place.  The patient was prepped and draped in the usual sterile fashion.  After appropriate anitbiotics were confirmed, a time-out was confirmed and all facts were verified.  0.25% Marcaine was used to infiltrate the umbilical area. A 11-blade was used to cut down the skin and blunt dissection was used to get the anterior fashion.  The anterior fascia was incised approximately 1 cm and the muscles were retracted laterally. Blunt dissection was then used to create a space in the preperitoneal area. At this time a 10 mm camera was then introduced into the space and advanced the pubic tubercle and a 12 mm trocar was placed over this and insufflation was started.  At this time and space was  created from medial to laterally the preperitoneal space.  Cooper's ligament was initially cleaned off.  The hernia sac was identified in the indirect space. Dissection of the hernia sac was undertaken the vas deferens was identified and protected in all parts of the case.  A large cord lipoma was also incarcerated and was reduced with traction out of the hernia canal  Once the hernia sac was taken down to approximately the umbilicus a Bard 3D Max mesh, size: Rachelle Hora, was  introduced into the preperitoneal space.  The mesh was brought over to cover the direct and indirect hernia spaces.  This was anchored into place and secured to Cooper's ligament with 4.52mm staples from a Coviden hernia stapler. It was anchored to the anterior abdominal wall with 4.8 mm staples. The hernia sac was seen lying posterior to the mesh. There was no staples placed laterally. The insufflation was evacuated and the peritoneum was seen posterior to the mesh. The trochars were removed.   The Veress needle technique was used to insuflate the abdomen at Palmer's point. The abdomen was insufflated to 14 mm mercury. Subsequently a 5 mm trocar was placed a camera inserted there was no injury to any intra-abdominal organs.    There was seen to be a non incarcerated  umbilical hernia.  A second camera port was in placed into the left lower quadrant.   At this the Falicform ligament was taken down with Bovie cautery maintaining hemostasis.   I proceeded to reduce the hernia contents.  The hernia  was reapproximated using a PMI suture passer and #1 novafils x 3.  Once the hernia contents were cleared away, a Bard Ventralight 15.2cm  mesh was inserted into the abdomen.  The mesh was secured circumferentially with am Securestrap tacker in a double crown fashion.   The omentum was brought over the area of the mesh. The pneumoperitoneum was evacuated  & all trocars  were removed. The skin was reapproximated with 4-0  Monocryl sutures in a  subcuticular fashion. The skin was dressed with Steri-Strips tape and gauze.  The patient was taken to the recovery room in stable condition.   The anterior fascia at the umbilical trochar site was reapproximated using #1 Vicryl on a UR- 6.  Intra-abdominal air was evacuated and the Veress needle removed. The skin was reapproximated using 4-0 Monocryl subcuticular fashion the patient was awakened from general anesthesia and taken to recovery in stable condition.    PLAN OF CARE: Discharge to home after PACU  PATIENT DISPOSITION:  PACU - hemodynamically stable.   Delay start of Pharmacological VTE agent (>24hrs) due to surgical blood loss or risk of bleeding: not applicable

## 2016-11-14 ENCOUNTER — Encounter (HOSPITAL_COMMUNITY): Payer: Self-pay | Admitting: General Surgery

## 2016-11-23 ENCOUNTER — Encounter: Payer: Self-pay | Admitting: Family Medicine

## 2017-01-02 ENCOUNTER — Encounter: Payer: Self-pay | Admitting: Adult Health

## 2017-01-02 ENCOUNTER — Ambulatory Visit (INDEPENDENT_AMBULATORY_CARE_PROVIDER_SITE_OTHER): Payer: Medicare Other | Admitting: Adult Health

## 2017-01-02 VITALS — BP 126/74 | Temp 98.5°F | Wt 225.0 lb

## 2017-01-02 DIAGNOSIS — M5442 Lumbago with sciatica, left side: Secondary | ICD-10-CM | POA: Diagnosis not present

## 2017-01-02 DIAGNOSIS — M5441 Lumbago with sciatica, right side: Secondary | ICD-10-CM

## 2017-01-02 MED ORDER — CYCLOBENZAPRINE HCL 5 MG PO TABS: 5.0000 mg | ORAL_TABLET | Freq: Three times a day (TID) | ORAL | 6 refills | Status: DC | PRN

## 2017-01-02 NOTE — Progress Notes (Signed)
Subjective:    Patient ID: Todd Larson, male    DOB: Dec 18, 1940, 77 y.o.   MRN: [id number]  HPI  77 year old male who  has a past medical history of Arthritis, Bulging discs, Diverticulosis of colon, GERD (gastroesophageal reflux disease), Hyperlipidemia, Hypertension, Inguinal hernia, OSA (obstructive sleep apnea), PSA elevation, and Recurrent sinus infections. He presents to the clinic today for low back pain that started soon after hernia surgery. Also reports numbness and tingling in bilateral lower extremities. He reports that the back pain is improving as is the numbness and tingling in his lower extremities. He is able to change positions without difficulty but was finding it difficult to pick up his cat's food bowls. Pain is described as "sharp" at times and with certain movements.   He has a prescription for Flexeril but has not been taking it due to it being out of date   Review of Systems See HPI   Past Medical History:  Diagnosis Date  . Arthritis    cervical spine - T7-8 & lumbar area - bone spurs up & down the back   . Bulging discs     thoracic spine  . Diverticulosis of colon   . GERD (gastroesophageal reflux disease)    related to intestinal system, - has started Align  4-5 days ago-   . Hyperlipidemia   . Hypertension   . Inguinal hernia   . OSA (obstructive sleep apnea)    saw Dr. Annamaria Boots- 02/2015- pt. remarks that as a result BP med. was changed , pt. has a sleep number bed & he reports the score averages in the 80% range   . PSA elevation    History of   . Recurrent sinus infections    related to fracture nose x2, also reports that he has a deviated septum    Social History   Socioeconomic History  . Marital status: Single    Spouse name: Not on file  . Number of children: 0  . Years of education: Not on file  . Highest education level: Not on file  Social Needs  . Financial resource strain: Not on file  . Food insecurity - worry: Not on file  . Food  insecurity - inability: Not on file  . Transportation needs - medical: Not on file  . Transportation needs - non-medical: Not on file  Occupational History  . Occupation: retired  Tobacco Use  . Smoking status: Never Smoker  . Smokeless tobacco: Never Used  Substance and Sexual Activity  . Alcohol use: Yes    Alcohol/week: 4.8 - 5.4 oz    Types: 8 - 9 Glasses of wine per week    Comment: social/ drinks wine(white)  . Drug use: No  . Sexual activity: Not on file  Other Topics Concern  . 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        Past Surgical History:  Procedure Laterality Date  . COLONOSCOPY  2008   3 internal hemorrhoids, diverticulosis;Dr.Jacobs  . INSERTION OF MESH N/A 11/13/2016   Procedure: INSERTION OF MESH;  Surgeon: Ralene Ok, MD;  Location: [facility];  Service: General;  Laterality: N/A;  . LAPAROSCOPIC INGUINAL HERNIA WITH UMBILICAL HERNIA Left 78/29/5621   Procedure: LAPAROSCOPIC LEFT INGUINAL HERNIA  AND UMBILICAL HERNIA REPAIRS WITH MESH;  Surgeon: Rosendo Gros,  Anne Hahn, MD;  Location: [facility];  Service: General;  Laterality: Left;  . PROSTATE BIOPSY     x 2, once abnormal, once normal  . TONSILLECTOMY      Family History  Problem Relation Age of Onset  . Lung cancer Maternal Grandfather        enviromental   . Heart attack Paternal Uncle   . Prostate cancer Brother     Allergies  Allergen Reactions  . Amoxicillin-Pot Clavulanate Diarrhea and Other (See Comments)    Night sweats, not allergic to Amoxicillin, just can't tolerate augmentin    Current Outpatient Medications on File Prior to Visit  Medication Sig Dispense Refill  . finasteride (PROSCAR) 5 MG tablet Take 5 mg by mouth daily.    . fluticasone (FLONASE) 50 MCG/ACT nasal spray Place 1 spray into both nostrils 2 (two) times daily as needed for rhinitis. 16 g 11  . gabapentin (NEURONTIN) 100 MG  capsule Take 2 capsules (200 mg total) by mouth at bedtime. (Patient taking differently: Take 100 mg daily as needed by mouth (for pain). ) 60 capsule 1  . hydrochlorothiazide (HYDRODIURIL) 25 MG tablet TAKE 1 TABLET BY MOUTH ONCE DAILY (Patient taking differently: TAKE 25 MG BY MOUTH ONCE DAILY) 90 tablet 2  . losartan (COZAAR) 50 MG tablet TAKE ONE TABLET BY MOUTH ONCE DAILY (Patient taking differently: TAKE 50 MG BY MOUTH ONCE DAILY) 90 tablet 3  . Polyvinyl Alcohol-Povidone (REFRESH OP) Place 1 drop as needed into both eyes (for dry eyes).    . Probiotic Product (ALIGN PO) Take 1 capsule daily by mouth.    . cyclobenzaprine (FLEXERIL) 5 MG tablet Take 1 tablet (5 mg total) by mouth 3 (three) times daily as needed for muscle spasms. (Patient not taking: Reported on 01/02/2017) 30 tablet 6   No current facility-administered medications on file prior to visit.     BP 126/74 (BP Location: Left Arm)   Temp 98.5 F (36.9 C) (Oral)   Wt 225 lb (102.1 kg)   BMI 34.21 kg/m       Objective:   Physical Exam  Constitutional: He is oriented to person, place, and time. He appears well-developed and well-nourished. No distress.  Cardiovascular: Normal rate, regular rhythm, normal heart sounds and intact distal pulses. Exam reveals no gallop.  No murmur heard. Pulmonary/Chest: Effort normal and breath sounds normal. No respiratory distress. He has no wheezes. He has no rales. He exhibits no tenderness.  Musculoskeletal: Normal range of motion. He exhibits tenderness. He exhibits no edema or deformity.  He has no spinal tenderness Slight discomfort when palpating muscles in lower back.  Was able to bend at waist without difficulty.   Neurological: He is alert and oriented to person, place, and time. He has normal strength and normal reflexes. He displays normal reflexes. No cranial nerve deficit or sensory deficit. He exhibits normal muscle tone. Coordination normal.  Skin: Skin is warm and dry. No  rash noted. He is not diaphoretic. No erythema. No pallor.  Psychiatric: He has a normal mood and affect. His behavior is normal. Judgment and thought content normal.  Nursing note and vitals reviewed.     Assessment & Plan:  1. Acute bilateral low back pain with bilateral sciatica - Appears to be more muscular related as well as positional. Possibly from inactivity prior to surgery. Discomfort is improving. We talked about treatment options and ultimately decided on  Conservative therapy. Stretching, Mortin and heating pad advised. Will refill his Flexeril.  -  He will follow up with myself or Charlann Boxer if symptoms do not resolve  - cyclobenzaprine (FLEXERIL) 5 MG tablet; Take 1 tablet (5 mg total) by mouth 3 (three) times daily as needed for muscle spasms.  Dispense: 30 tablet; Refill: 6   Dorothyann Peng, NP

## 2017-01-04 ENCOUNTER — Encounter: Payer: Self-pay | Admitting: Adult Health

## 2017-03-11 ENCOUNTER — Other Ambulatory Visit: Payer: Self-pay | Admitting: Adult Health

## 2017-03-11 DIAGNOSIS — I10 Essential (primary) hypertension: Secondary | ICD-10-CM

## 2017-03-11 DIAGNOSIS — H2513 Age-related nuclear cataract, bilateral: Secondary | ICD-10-CM | POA: Diagnosis not present

## 2017-03-12 ENCOUNTER — Encounter: Payer: Self-pay | Admitting: Adult Health

## 2017-03-12 NOTE — Telephone Encounter (Signed)
See my chart message

## 2017-03-13 NOTE — Telephone Encounter (Signed)
Sent to the pharmacy by e-scribe. 

## 2017-03-13 NOTE — Telephone Encounter (Signed)
Pt now scheduled.  Sent to the pharmacy by e-scribe.

## 2017-03-13 NOTE — Telephone Encounter (Signed)
Patient is schedule for 04/09/17, can meds be sent? Please advise Call back [phone]

## 2017-03-18 ENCOUNTER — Telehealth: Payer: Self-pay | Admitting: *Deleted

## 2017-03-18 NOTE — Telephone Encounter (Signed)
Copied from [facility]. Topic: General - Other >> Mar 18, 2017  1:45 PM Marin Olp L wrote: Reason for CRM: Patient still getting overdue alert for colonoscopy in mychart even though he had one on 07/18/2016.

## 2017-03-19 NOTE — Telephone Encounter (Signed)
Left a message for a return call.  Need to know where pt had colonoscopy.

## 2017-03-19 NOTE — Telephone Encounter (Signed)
I spoke with pt, he had a cologuard in 2018, should be good for 3 years. He cannot lay on side long enough to do another colonoscopy, said Dr. Ardis Hughs ordered the cologuard.

## 2017-03-19 NOTE — Telephone Encounter (Signed)
The following was cut/pasted from 07/2016 note.  I believe I must have seen the cologuard report result myself to have written it.  The cologuard company should be able to forward another copy of that result.    Thanks     Notes recorded by Jeoffrey Massed, RN on 07/27/2016 at 8:47 AM EDT The patient has been notified of this information and all questions answered. ------  Notes recorded by Milus Banister, MD on 07/27/2016 at 7:06 AM EDT   Please call the patient. cologuard test was negative, he does not need further colon cancer screening (will be 77 next year).

## 2017-03-19 NOTE — Telephone Encounter (Signed)
Dr. Paulino Door  I am unable to find Cologuard report to enter in system.  Please help.

## 2017-03-20 NOTE — Telephone Encounter (Signed)
Health Maintenance  updated

## 2017-03-26 DIAGNOSIS — N138 Other obstructive and reflux uropathy: Secondary | ICD-10-CM | POA: Diagnosis not present

## 2017-03-26 DIAGNOSIS — N401 Enlarged prostate with lower urinary tract symptoms: Secondary | ICD-10-CM | POA: Diagnosis not present

## 2017-04-09 ENCOUNTER — Ambulatory Visit (INDEPENDENT_AMBULATORY_CARE_PROVIDER_SITE_OTHER): Payer: Medicare Other | Admitting: Adult Health

## 2017-04-09 ENCOUNTER — Encounter: Payer: Self-pay | Admitting: Adult Health

## 2017-04-09 VITALS — BP 130/72 | Temp 98.3°F | Ht 68.0 in | Wt 228.0 lb

## 2017-04-09 DIAGNOSIS — H1010 Acute atopic conjunctivitis, unspecified eye: Secondary | ICD-10-CM | POA: Diagnosis not present

## 2017-04-09 DIAGNOSIS — R7301 Impaired fasting glucose: Secondary | ICD-10-CM | POA: Diagnosis not present

## 2017-04-09 DIAGNOSIS — E782 Mixed hyperlipidemia: Secondary | ICD-10-CM

## 2017-04-09 DIAGNOSIS — I10 Essential (primary) hypertension: Secondary | ICD-10-CM | POA: Diagnosis not present

## 2017-04-09 DIAGNOSIS — J309 Allergic rhinitis, unspecified: Secondary | ICD-10-CM | POA: Diagnosis not present

## 2017-04-09 MED ORDER — LOSARTAN POTASSIUM 50 MG PO TABS: 50.0000 mg | ORAL_TABLET | Freq: Every day | ORAL | 3 refills | Status: DC

## 2017-04-09 MED ORDER — FLUTICASONE PROPIONATE 50 MCG/ACT NA SUSP: 1.0000 | Freq: Two times a day (BID) | NASAL | 3 refills | Status: DC | PRN

## 2017-04-09 NOTE — Progress Notes (Signed)
Subjective:    Patient ID: Todd Larson, male    DOB: [birth year], 77 y.o.   MRN: [id number]  HPI Patient presents for yearly preventative medicine examination. He is a pleasant 77 year old male who  has a past medical history of Arthritis, Bulging discs, Diverticulosis of colon, GERD (gastroesophageal reflux disease), Hyperlipidemia, Hypertension, Inguinal hernia, OSA (obstructive sleep apnea), PSA elevation, and Recurrent sinus infections.  He takes Proscar 5 mg due to history of BPH   He takes HCTZ 25 mg and losartan 50 mg for hypertension  BP Readings from Last 3 Encounters:  04/09/17 130/72  01/02/17 126/74  11/13/16 140/83   He does have a history of hyperlipidemia but this has been diet controlled in the past.    All immunizations and health maintenance protocols were reviewed with the patient and needed orders were placed. He is UTD on vaccinations.   Appropriate screening laboratory values were ordered for the patient including screening of hyperlipidemia, renal function and hepatic function. If indicated by BPH, a PSA was ordered.  Medication reconciliation,  past medical history, social history, problem list and allergies were reviewed in detail with the patient  Goals were established with regard to weight loss, exercise, and  diet in compliance with medications  End of life planning was discussed.  He is up to date on his colon cancer screen]  He has been seen by Urology last month. Denies any changes   Interval history includes umbilical hernia repair in November 2018 - has recovered well from this    Review of Systems See HPI   Past Medical History:  Diagnosis Date  . Arthritis    cervical spine - T7-8 & lumbar area - bone spurs up & down the back   . Bulging discs     thoracic spine  . Diverticulosis of colon   . GERD (gastroesophageal reflux disease)    related to intestinal system, - has started Align  4-5 days ago-   . Hyperlipidemia   .  Hypertension   . Inguinal hernia   . OSA (obstructive sleep apnea)    saw Dr. Annamaria Boots- 02/2015- pt. remarks that as a result BP med. was changed , pt. has a sleep number bed & he reports the score averages in the 80% range   . PSA elevation    History of   . Recurrent sinus infections    related to fracture nose x2, also reports that he has a deviated septum    Social History   Socioeconomic History  . Marital status: Single    Spouse name: Not on file  . Number of children: 0  . Years of education: Not on file  . Highest education level: Not on file  Occupational History  . Occupation: retired  Scientific laboratory technician  . Financial resource strain: Not on file  . Food insecurity:    Worry: Not on file    Inability: Not on file  . Transportation needs:    Medical: Not on file    Non-medical: Not on file  Tobacco Use  . Smoking status: Never Smoker  . Smokeless tobacco: Never Used  Substance and Sexual Activity  . Alcohol use: Yes    Alcohol/week: 4.8 - 5.4 oz    Types: 8 - 9 Glasses of wine per week    Comment: social/ drinks wine(white)  . Drug use: No  . Sexual activity: Not on file  Lifestyle  . Physical activity:    Days per  week: Not on file    Minutes per session: Not on file  . Stress: Not on file  Relationships  . Social connections:    Talks on phone: Not on file    Gets together: Not on file    Attends religious service: Not on file    Active member of club or organization: Not on file    Attends meetings of clubs or organizations: Not on file    Relationship status: Not on file  . Intimate partner violence:    Fear of current or ex partner: Not on file    Emotionally abused: Not on file    Physically abused: Not on file    Forced sexual activity: Not on file  Other Topics Concern  . 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        Past Surgical History:  Procedure  Laterality Date  . COLONOSCOPY  2008   3 internal hemorrhoids, diverticulosis;Dr.Jacobs  . INSERTION OF MESH N/A 11/13/2016   Procedure: INSERTION OF MESH;  Surgeon: Ralene Ok, MD;  Location: [facility];  Service: General;  Laterality: N/A;  . LAPAROSCOPIC INGUINAL HERNIA WITH UMBILICAL HERNIA Left 03/47/4259   Procedure: LAPAROSCOPIC LEFT INGUINAL HERNIA  AND UMBILICAL HERNIA REPAIRS WITH MESH;  Surgeon: Ralene Ok, MD;  Location: [facility];  Service: General;  Laterality: Left;  . PROSTATE BIOPSY     x 2, once abnormal, once normal  . TONSILLECTOMY      Family History  Problem Relation Age of Onset  . Lung cancer Maternal Grandfather        enviromental   . Heart attack Paternal Uncle   . Prostate cancer Brother     Allergies  Allergen Reactions  . Amoxicillin-Pot Clavulanate Diarrhea and Other (See Comments)    Night sweats, not allergic to Amoxicillin, just can't tolerate augmentin    Current Outpatient Medications on File Prior to Visit  Medication Sig Dispense Refill  . cyclobenzaprine (FLEXERIL) 5 MG tablet Take 1 tablet (5 mg total) by mouth 3 (three) times daily as needed for muscle spasms. 30 tablet 6  . finasteride (PROSCAR) 5 MG tablet Take 5 mg by mouth daily.    . hydrochlorothiazide (HYDRODIURIL) 25 MG tablet TAKE 1 TABLET BY MOUTH ONCE DAILY (Patient taking differently: TAKE 25 MG BY MOUTH ONCE DAILY) 90 tablet 2  . Polyvinyl Alcohol-Povidone (REFRESH OP) Place 1 drop as needed into both eyes (for dry eyes).    . gabapentin (NEURONTIN) 100 MG capsule Take 2 capsules (200 mg total) by mouth at bedtime. (Patient not taking: Reported on 04/09/2017) 60 capsule 1  . Probiotic Product (ALIGN PO) Take 1 capsule daily by mouth.     No current facility-administered medications on file prior to visit.     BP 130/72 (BP Location: Left Arm)   Temp 98.3 F (36.8 C) (Oral)   Ht 5\' 8"  (1.727 m) Comment: WITH SHOES  Wt 228 lb (103.4 kg)   BMI 34.67 kg/m         Objective:   Physical Exam  Constitutional: He is oriented to person, place, and time. He appears well-developed and well-nourished. No distress.  HENT:  Head: Normocephalic and atraumatic.  Right Ear: External ear normal.  Left Ear: External ear normal.  Nose: Nose normal.  Mouth/Throat: Oropharynx is clear and moist.  No oropharyngeal exudate.  Eyes: Pupils are equal, round, and reactive to light. Conjunctivae and EOM are normal. Right eye exhibits no discharge. Left eye exhibits no discharge. No scleral icterus.  Neck: Normal range of motion. Neck supple. No thyromegaly present.  Cardiovascular: Normal rate, regular rhythm, normal heart sounds and intact distal pulses. Exam reveals no gallop and no friction rub.  No murmur heard. Pulmonary/Chest: Effort normal and breath sounds normal. No respiratory distress. He has no wheezes. He has no rales. He exhibits no tenderness.  Abdominal: Soft. Bowel sounds are normal. He exhibits no distension and no mass. There is no tenderness. There is no rebound and no guarding.  Obese  Surgical scar on abdomen from previous surgery   Genitourinary:  Genitourinary Comments: Done by urology   Musculoskeletal: Normal range of motion. He exhibits no edema, tenderness or deformity.  Lymphadenopathy:    He has no cervical adenopathy.  Neurological: He is alert and oriented to person, place, and time. He has normal reflexes. He displays normal reflexes. No cranial nerve deficit. He exhibits normal muscle tone. Coordination normal.  Skin: Skin is warm and dry. No rash noted. He is not diaphoretic. No erythema. No pallor.  Psychiatric: He has a normal mood and affect. His behavior is normal. Judgment and thought content normal.  Nursing note and vitals reviewed.     Assessment & Plan:  1. Essential hypertension - near goal. No change in medication at this time  - losartan (COZAAR) 50 MG tablet; Take 1 tablet (50 mg total) by mouth daily.  Dispense: 90  tablet; Refill: 3 - Basic metabolic panel; Future - CBC with Differential/Platelet; Future - Hepatic function panel; Future - Lipid panel; Future - TSH; Future - Hemoglobin A1c; Future  2. Allergic rhinoconjunctivitis  - fluticasone (FLONASE) 50 MCG/ACT nasal spray; Place 1 spray into both nostrils 2 (two) times daily as needed for rhinitis.  Dispense: 48 g; Refill: 3  3. Mixed hyperlipidemia - Consider statin  - Basic metabolic panel; Future - CBC with Differential/Platelet; Future - Hepatic function panel; Future - Lipid panel; Future - TSH; Future - Hemoglobin A1c; Future  4. Elevated fasting glucose  - Basic metabolic panel; Future - Hemoglobin A1c; Future   Dorothyann Peng, NP

## 2017-04-10 LAB — LIPID PANEL
CHOL/HDL RATIO: 4
Cholesterol: 152 mg/dL (ref 0–200)
HDL: 41.3 mg/dL (ref >39.00)
LDL CALC: 96 mg/dL (ref 0–99)
NONHDL: 110.27
Triglycerides: 73 mg/dL (ref 0.0–149.0)
VLDL: 14.6 mg/dL (ref 0.0–40.0)

## 2017-04-10 LAB — BASIC METABOLIC PANEL
BUN: 15 mg/dL (ref 6–23)
CALCIUM: 9 mg/dL (ref 8.4–10.5)
CHLORIDE: 101 meq/L (ref 96–112)
CO2: 31 meq/L (ref 19–32)
Creatinine, Ser: 1 mg/dL (ref 0.40–1.50)
GFR: 77.48 mL/min (ref >60.00)
GLUCOSE: 110 mg/dL — AB (ref 70–99)
Potassium: 3.9 mEq/L (ref 3.5–5.1)
Sodium: 140 mEq/L (ref 135–145)

## 2017-04-10 LAB — HEMOGLOBIN A1C: Hgb A1c MFr Bld: 5.3 % (ref 4.6–6.5)

## 2017-04-10 LAB — CBC WITH DIFFERENTIAL/PLATELET
BASOS ABS: 0.1 10*3/uL (ref 0.0–0.1)
Basophils Relative: 0.9 % (ref 0.0–3.0)
EOS PCT: 3.3 % (ref 0.0–5.0)
Eosinophils Absolute: 0.2 10*3/uL (ref 0.0–0.7)
HEMATOCRIT: 51.2 % (ref 39.0–52.0)
HEMOGLOBIN: 17.9 g/dL — AB (ref 13.0–17.0)
LYMPHS ABS: 1.9 10*3/uL (ref 0.7–4.0)
LYMPHS PCT: 29.1 % (ref 12.0–46.0)
MCHC: 35 g/dL (ref 30.0–36.0)
MCV: 88.8 fl (ref 78.0–100.0)
MONOS PCT: 8.3 % (ref 3.0–12.0)
Monocytes Absolute: 0.5 10*3/uL (ref 0.1–1.0)
Neutro Abs: 3.8 10*3/uL (ref 1.4–7.7)
Neutrophils Relative %: 58.4 % (ref 43.0–77.0)
Platelets: 248 10*3/uL (ref 150.0–400.0)
RBC: 5.76 Mil/uL (ref 4.22–5.81)
RDW: 13.8 % (ref 11.5–15.5)
WBC: 6.6 10*3/uL (ref 4.0–10.5)

## 2017-04-10 LAB — TSH: TSH: 3.01 u[IU]/mL (ref 0.35–4.50)

## 2017-04-10 LAB — HEPATIC FUNCTION PANEL
ALBUMIN: 4 g/dL (ref 3.5–5.2)
ALK PHOS: 53 U/L (ref 39–117)
ALT: 24 U/L (ref 0–53)
AST: 19 U/L (ref 0–37)
Bilirubin, Direct: 0.2 mg/dL (ref 0.0–0.3)
Total Bilirubin: 1.2 mg/dL (ref 0.2–1.2)
Total Protein: 6.4 g/dL (ref 6.0–8.3)

## 2017-04-10 NOTE — Addendum Note (Signed)
Addended by: Tomi Likens on: 04/10/2017 07:37 AM   Modules accepted: Orders

## 2017-04-25 ENCOUNTER — Other Ambulatory Visit: Payer: Self-pay | Admitting: Adult Health

## 2017-04-25 DIAGNOSIS — I10 Essential (primary) hypertension: Secondary | ICD-10-CM

## 2017-04-25 NOTE — Telephone Encounter (Signed)
Sent to the pharmacy by e-scribe. 

## 2017-05-15 ENCOUNTER — Ambulatory Visit (INDEPENDENT_AMBULATORY_CARE_PROVIDER_SITE_OTHER): Payer: Medicare Other | Admitting: Adult Health

## 2017-05-15 ENCOUNTER — Encounter: Payer: Self-pay | Admitting: Adult Health

## 2017-05-15 VITALS — BP 120/74 | Temp 98.3°F | Wt 230.0 lb

## 2017-05-15 DIAGNOSIS — Q5310 Unspecified undescended testicle, unilateral: Secondary | ICD-10-CM

## 2017-05-15 NOTE — Progress Notes (Signed)
Subjective:    Patient ID: Todd Larson, male    DOB: 1940/07/05, 77 y.o.   MRN: [id number]  HPI  77 year old male who  has a past medical history of Arthritis, Bulging discs, Diverticulosis of colon, GERD (gastroesophageal reflux disease), Hyperlipidemia, Hypertension, Inguinal hernia, OSA (obstructive sleep apnea), PSA elevation, and Recurrent sinus infections.  He presents to the office today for an acute on chronic complaint of testicle retraction.  Reports that this is been an ongoing issue for the last few years most recently happened approximately 1 year ago.  Been seen by urology in the past for this issue and has had ultrasounds done which he reports always been negative ( I do not have these results).  Most recently had a hard bowel movement soon after noticed that his left testicle had retracted.  He denies any pain but does have discomfort.  Reports in the past that when this happened his testicle stayed retracted for a week or 2 and then distended again.  Does have history of inguinal hernia repair in November 2018.  Review of Systems See HPI   Past Medical History:  Diagnosis Date  . Arthritis    cervical spine - T7-8 & lumbar area - bone spurs up & down the back   . Bulging discs     thoracic spine  . Diverticulosis of colon   . GERD (gastroesophageal reflux disease)    related to intestinal system, - has started Align  4-5 days ago-   . Hyperlipidemia   . Hypertension   . Inguinal hernia   . OSA (obstructive sleep apnea)    saw Dr. Annamaria Boots- 02/2015- pt. remarks that as a result BP med. was changed , pt. has a sleep number bed & he reports the score averages in the 80% range   . PSA elevation    History of   . Recurrent sinus infections    related to fracture nose x2, also reports that he has a deviated septum    Social History   Socioeconomic History  . Marital status: Single    Spouse name: Not on file  . Number of children: 0  . Years of education: Not on file    . Highest education level: Not on file  Occupational History  . Occupation: retired  Scientific laboratory technician  . Financial resource strain: Not on file  . Food insecurity:    Worry: Not on file    Inability: Not on file  . Transportation needs:    Medical: Not on file    Non-medical: Not on file  Tobacco Use  . Smoking status: Never Smoker  . Smokeless tobacco: Never Used  Substance and Sexual Activity  . Alcohol use: Yes    Alcohol/week: 4.8 - 5.4 oz    Types: 8 - 9 Glasses of wine per week    Comment: social/ drinks wine(white)  . Drug use: No  . Sexual activity: Not on file  Lifestyle  . Physical activity:    Days per week: Not on file    Minutes per session: Not on file  . Stress: Not on file  Relationships  . Social connections:    Talks on phone: Not on file    Gets together: Not on file    Attends religious service: Not on file    Active member of club or organization: Not on file    Attends meetings of clubs or organizations: Not on file    Relationship status:  Not on file  . Intimate partner violence:    Fear of current or ex partner: Not on file    Emotionally abused: Not on file    Physically abused: Not on file    Forced sexual activity: Not on file  Other Topics Concern  . 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        Past Surgical History:  Procedure Laterality Date  . COLONOSCOPY  2008   3 internal hemorrhoids, diverticulosis;Dr.Jacobs  . INSERTION OF MESH N/A 11/13/2016   Procedure: INSERTION OF MESH;  Surgeon: Ralene Ok, MD;  Location: [facility];  Service: General;  Laterality: N/A;  . LAPAROSCOPIC INGUINAL HERNIA WITH UMBILICAL HERNIA Left 40/98/1191   Procedure: LAPAROSCOPIC LEFT INGUINAL HERNIA  AND UMBILICAL HERNIA REPAIRS WITH MESH;  Surgeon: Ralene Ok, MD;  Location: [facility];  Service: General;  Laterality: Left;  . PROSTATE BIOPSY     x 2, once  abnormal, once normal  . TONSILLECTOMY      Family History  Problem Relation Age of Onset  . Lung cancer Maternal Grandfather        enviromental   . Heart attack Paternal Uncle   . Prostate cancer Brother     Allergies  Allergen Reactions  . Amoxicillin-Pot Clavulanate Diarrhea and Other (See Comments)    Night sweats, not allergic to Amoxicillin, just can't tolerate augmentin    Current Outpatient Medications on File Prior to Visit  Medication Sig Dispense Refill  . cyclobenzaprine (FLEXERIL) 5 MG tablet Take 1 tablet (5 mg total) by mouth 3 (three) times daily as needed for muscle spasms. 30 tablet 6  . finasteride (PROSCAR) 5 MG tablet Take 5 mg by mouth daily.    . fluticasone (FLONASE) 50 MCG/ACT nasal spray Place 1 spray into both nostrils 2 (two) times daily as needed for rhinitis. 48 g 3  . gabapentin (NEURONTIN) 100 MG capsule Take 2 capsules (200 mg total) by mouth at bedtime. 60 capsule 1  . hydrochlorothiazide (HYDRODIURIL) 25 MG tablet TAKE 1 TABLET BY MOUTH ONCE DAILY 90 tablet 3  . losartan (COZAAR) 50 MG tablet Take 1 tablet (50 mg total) by mouth daily. 90 tablet 3  . Polyvinyl Alcohol-Povidone (REFRESH OP) Place 1 drop as needed into both eyes (for dry eyes).    . Probiotic Product (ALIGN PO) Take 1 capsule daily by mouth.     No current facility-administered medications on file prior to visit.     BP 120/74 (BP Location: Left Arm)   Temp 98.3 F (36.8 C) (Oral)   Wt 230 lb (104.3 kg)   BMI 34.97 kg/m       Objective:   Physical Exam  Constitutional: He is oriented to person, place, and time. He appears well-developed and well-nourished. No distress.  Abdominal: Soft. Bowel sounds are normal. He exhibits no distension and no mass. There is no tenderness. There is no rebound and no guarding. No hernia.  Genitourinary: Right testis shows no mass, no swelling and no tenderness. Right testis is descended. Cremasteric reflex is not absent on the right side.  Left testis shows no mass, no swelling and no tenderness. Left testis is undescended. Cremasteric reflex is not absent on the left side.  Genitourinary Comments: Left testicle is retracted.  No signs of torsion noted.  No tenderness with  palpation.  Neurological: He is alert and oriented to person, place, and time.  Skin: Skin is warm and dry. He is not diaphoretic.  Nursing note and vitals reviewed.     Assessment & Plan:  1. Undescended left testicle -No signs of torsion.  Advised that there is very little we could do in the office today.  He is advised to follow-up with his urologist. -Follow-up in the emergency room if pain develops  Dorothyann Peng, NP

## 2017-06-18 DIAGNOSIS — H2513 Age-related nuclear cataract, bilateral: Secondary | ICD-10-CM | POA: Diagnosis not present

## 2017-07-03 ENCOUNTER — Ambulatory Visit (INDEPENDENT_AMBULATORY_CARE_PROVIDER_SITE_OTHER): Payer: Medicare Other | Admitting: Family Medicine

## 2017-07-03 ENCOUNTER — Encounter: Payer: Self-pay | Admitting: Family Medicine

## 2017-07-03 VITALS — BP 120/68 | HR 113 | Temp 98.5°F | Wt 229.7 lb

## 2017-07-03 DIAGNOSIS — K439 Ventral hernia without obstruction or gangrene: Secondary | ICD-10-CM | POA: Diagnosis not present

## 2017-07-03 NOTE — Progress Notes (Signed)
  Subjective:     Patient ID: Todd Larson, male   DOB: March 07, 1940, 77 y.o.   MRN: [id number]  HPI Patient seen with a "bulge" just left of the umbilical region. Last November he had laparoscopic left inguinal hernia and umbilical hernia repairs with mesh. He has had some recent constipation. Denies any other straining or heavy lifting. Few days ago noticed some bulge in the umbilical area as above. No associated pain. Denies any nausea or vomiting. No fever.  Past Medical History:  Diagnosis Date  . Arthritis    cervical spine - T7-8 & lumbar area - bone spurs up & down the back   . Bulging discs     thoracic spine  . Diverticulosis of colon   . GERD (gastroesophageal reflux disease)    related to intestinal system, - has started Align  4-5 days ago-   . Hyperlipidemia   . Hypertension   . Inguinal hernia   . OSA (obstructive sleep apnea)    saw Dr. Annamaria Boots- 02/2015- pt. remarks that as a result BP med. was changed , pt. has a sleep number bed & he reports the score averages in the 80% range   . PSA elevation    History of   . Recurrent sinus infections    related to fracture nose x2, also reports that he has a deviated septum   Past Surgical History:  Procedure Laterality Date  . COLONOSCOPY  2008   3 internal hemorrhoids, diverticulosis;Dr.Jacobs  . INSERTION OF MESH N/A 11/13/2016   Procedure: INSERTION OF MESH;  Surgeon: Ralene Ok, MD;  Location: [place];  Service: General;  Laterality: N/A;  . LAPAROSCOPIC INGUINAL HERNIA WITH UMBILICAL HERNIA Left 17/79/3903   Procedure: LAPAROSCOPIC LEFT INGUINAL HERNIA  AND UMBILICAL HERNIA REPAIRS WITH MESH;  Surgeon: Ralene Ok, MD;  Location: [place];  Service: General;  Laterality: Left;  . PROSTATE BIOPSY     x 2, once abnormal, once normal  . TONSILLECTOMY      reports that he has never smoked. He has never used smokeless tobacco. He reports that he drinks about 4.8 - 5.4 oz of alcohol per week. He reports that he does not use  drugs. family history includes Heart attack in his paternal uncle; Lung cancer in his maternal grandfather; Prostate cancer in his brother. Allergies  Allergen Reactions  . Amoxicillin-Pot Clavulanate Diarrhea and Other (See Comments)    Night sweats, not allergic to Amoxicillin, just can't tolerate augmentin     Review of Systems  Constitutional: Negative for chills and fever.  Gastrointestinal: Positive for constipation. Negative for abdominal pain, blood in stool, nausea and vomiting.       Objective:   Physical Exam  Constitutional: He appears well-developed and well-nourished.  Cardiovascular: Normal rate and regular rhythm.  Pulmonary/Chest: Effort normal and breath sounds normal.  Abdominal: Soft. There is no tenderness.  Patient has what appears to be a small soft hernia just left of the umbilical region. This is easily reducible. No overlying skin changes. Nontender       Assessment:     Patient presents with small bulge abdominal wall just left of umbilicus- suspect recurrent hernia.. Previous laparoscopic umbilical and left inguinal hernia repair    Plan:     -Will set back up with his surgeon for further evaluation -Reviewed signs and symptoms of strangulation  Eulas Post MD [facility]

## 2017-07-03 NOTE — Patient Instructions (Signed)
Constipation, Adult Constipation is when a person has fewer bowel movements in a week than normal, has difficulty having a bowel movement, or has stools that are dry, hard, or larger than normal. Constipation may be caused by an underlying condition. It may become worse with age if a person takes certain medicines and does not take in enough fluids. Follow these instructions at home: Eating and drinking   Eat foods that have a lot of fiber, such as fresh fruits and vegetables, whole grains, and beans.  Limit foods that are high in fat, low in fiber, or overly processed, such as french fries, hamburgers, cookies, candies, and soda.  Drink enough fluid to keep your urine clear or pale yellow. General instructions  Exercise regularly or as told by your health care provider.  Go to the restroom when you have the urge to go. Do not hold it in.  Take over-the-counter and prescription medicines only as told by your health care provider. These include any fiber supplements.  Practice pelvic floor retraining exercises, such as deep breathing while relaxing the lower abdomen and pelvic floor relaxation during bowel movements.  Watch your condition for any changes.  Keep all follow-up visits as told by your health care provider. This is important. Contact a health care provider if:  You have pain that gets worse.  You have a fever.  You do not have a bowel movement after 4 days.  You vomit.  You are not hungry.  You lose weight.  You are bleeding from the anus.  You have thin, pencil-like stools. Get help right away if:  You have a fever and your symptoms suddenly get worse.  You leak stool or have blood in your stool.  Your abdomen is bloated.  You have severe pain in your abdomen.  You feel dizzy or you faint. This information is not intended to replace advice given to you by your health care provider. Make sure you discuss any questions you have with your health care  provider. Document Released: 09/16/2003 Document Revised: 07/08/2015 Document Reviewed: 06/08/2015 Elsevier Interactive Patient Education  [employer].  We will set up referral back to general surgery.

## 2017-07-18 DIAGNOSIS — N401 Enlarged prostate with lower urinary tract symptoms: Secondary | ICD-10-CM | POA: Diagnosis not present

## 2017-07-18 DIAGNOSIS — N138 Other obstructive and reflux uropathy: Secondary | ICD-10-CM | POA: Diagnosis not present

## 2017-07-25 DIAGNOSIS — R1030 Lower abdominal pain, unspecified: Secondary | ICD-10-CM | POA: Diagnosis not present

## 2017-07-31 ENCOUNTER — Other Ambulatory Visit: Payer: Self-pay | Admitting: General Surgery

## 2017-07-31 DIAGNOSIS — Z9889 Other specified postprocedural states: Secondary | ICD-10-CM

## 2017-08-09 ENCOUNTER — Ambulatory Visit
Admission: RE | Admit: 2017-08-09 | Discharge: 2017-08-09 | Disposition: A | Discharge status: Home / Self Care | Payer: Medicare Other | Source: Ambulatory Visit | Attending: General Surgery | Admitting: General Surgery

## 2017-08-09 DIAGNOSIS — Z9889 Other specified postprocedural states: Secondary | ICD-10-CM

## 2017-08-09 DIAGNOSIS — K449 Diaphragmatic hernia without obstruction or gangrene: Secondary | ICD-10-CM | POA: Diagnosis not present

## 2017-08-21 DIAGNOSIS — Z09 Encounter for follow-up examination after completed treatment for conditions other than malignant neoplasm: Secondary | ICD-10-CM | POA: Diagnosis not present

## 2017-10-10 ENCOUNTER — Encounter: Payer: Self-pay | Admitting: Adult Health

## 2017-10-10 ENCOUNTER — Ambulatory Visit (INDEPENDENT_AMBULATORY_CARE_PROVIDER_SITE_OTHER): Payer: Medicare Other | Admitting: Adult Health

## 2017-10-10 VITALS — BP 112/68 | HR 103 | Temp 98.0°F | Wt 231.3 lb

## 2017-10-10 DIAGNOSIS — Z23 Encounter for immunization: Secondary | ICD-10-CM

## 2017-10-10 DIAGNOSIS — R197 Diarrhea, unspecified: Secondary | ICD-10-CM

## 2017-10-10 NOTE — Progress Notes (Signed)
Subjective:    Patient ID: Todd Larson, male    DOB: February 02, 1940, 77 y.o.   MRN: [id number]  HPI 77 year old male who  has a past medical history of Arthritis, Bulging discs, Diverticulosis of colon, GERD (gastroesophageal reflux disease), Hyperlipidemia, Hypertension, Inguinal hernia, OSA (obstructive sleep apnea), PSA elevation, and Recurrent sinus infections.  Presents to the office today for a chronic condition of intermittent episodes of diarrhea and constipation.  This is been a constant issue for the last few years.  He has been seen by GI in the past for this issue.  He believes that diarrhea is the cause of lactose.  He has noticed that when he is not having any dairy that he has normal stools.  Soon after eating very he will have diarrhea for 1 to 2 days.  Does have associated abdominal discomfort, distention, and gas after eating dairy No nausea or vomiting   Review of Systems See HPI   Past Medical History:  Diagnosis Date  . Arthritis    cervical spine - T7-8 & lumbar area - bone spurs up & down the back   . Bulging discs     thoracic spine  . Diverticulosis of colon   . GERD (gastroesophageal reflux disease)    related to intestinal system, - has started Align  4-5 days ago-   . Hyperlipidemia   . Hypertension   . Inguinal hernia   . OSA (obstructive sleep apnea)    saw Dr. Annamaria Boots- 02/2015- pt. remarks that as a result BP med. was changed , pt. has a sleep number bed & he reports the score averages in the 80% range   . PSA elevation    History of   . Recurrent sinus infections    related to fracture nose x2, also reports that he has a deviated septum    Social History   Socioeconomic History  . Marital status: Single    Spouse name: Not on file  . Number of children: 0  . Years of education: Not on file  . Highest education level: Not on file  Occupational History  . Occupation: retired  Scientific laboratory technician  . Financial resource strain: Not on file  . Food  insecurity:    Worry: Not on file    Inability: Not on file  . Transportation needs:    Medical: Not on file    Non-medical: Not on file  Tobacco Use  . Smoking status: Never Smoker  . Smokeless tobacco: Never Used  Substance and Sexual Activity  . Alcohol use: Yes    Alcohol/week: 8.0 - 9.0 standard drinks    Types: 8 - 9 Glasses of wine per week    Comment: social/ drinks wine(white)  . Drug use: No  . Sexual activity: Not on file  Lifestyle  . Physical activity:    Days per week: Not on file    Minutes per session: Not on file  . Stress: Not on file  Relationships  . Social connections:    Talks on phone: Not on file    Gets together: Not on file    Attends religious service: Not on file    Active member of club or organization: Not on file    Attends meetings of clubs or organizations: Not on file    Relationship status: Not on file  . Intimate partner violence:    Fear of current or ex partner: Not on file    Emotionally abused: Not  on file    Physically abused: Not on file    Forced sexual activity: Not on file  Other Topics Concern  . 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        Past Surgical History:  Procedure Laterality Date  . COLONOSCOPY  2008   3 internal hemorrhoids, diverticulosis;Dr.Jacobs  . INSERTION OF MESH N/A 11/13/2016   Procedure: INSERTION OF MESH;  Surgeon: Ralene Ok, MD;  Location: [place];  Service: General;  Laterality: N/A;  . LAPAROSCOPIC INGUINAL HERNIA WITH UMBILICAL HERNIA Left 06/27/9483   Procedure: LAPAROSCOPIC LEFT INGUINAL HERNIA  AND UMBILICAL HERNIA REPAIRS WITH MESH;  Surgeon: Ralene Ok, MD;  Location: [place];  Service: General;  Laterality: Left;  . PROSTATE BIOPSY     x 2, once abnormal, once normal  . TONSILLECTOMY      Family History  Problem Relation Age of Onset  . Lung cancer Maternal Grandfather         enviromental   . Heart attack Paternal Uncle   . Prostate cancer Brother     Allergies  Allergen Reactions  . Amoxicillin-Pot Clavulanate Diarrhea and Other (See Comments)    Night sweats, not allergic to Amoxicillin, just can't tolerate augmentin    Current Outpatient Medications on File Prior to Visit  Medication Sig Dispense Refill  . cyclobenzaprine (FLEXERIL) 5 MG tablet Take 1 tablet (5 mg total) by mouth 3 (three) times daily as needed for muscle spasms. 30 tablet 6  . finasteride (PROSCAR) 5 MG tablet Take 5 mg by mouth daily.    . fluticasone (FLONASE) 50 MCG/ACT nasal spray Place 1 spray into both nostrils 2 (two) times daily as needed for rhinitis. 48 g 3  . gabapentin (NEURONTIN) 100 MG capsule Take 2 capsules (200 mg total) by mouth at bedtime. (Patient taking differently: Take 200 mg by mouth at bedtime as needed. ) 60 capsule 1  . hydrochlorothiazide (HYDRODIURIL) 25 MG tablet TAKE 1 TABLET BY MOUTH ONCE DAILY 90 tablet 3  . losartan (COZAAR) 50 MG tablet Take 1 tablet (50 mg total) by mouth daily. 90 tablet 3  . Polyvinyl Alcohol-Povidone (REFRESH OP) Place 1 drop as needed into both eyes (for dry eyes).    . Probiotic Product (ALIGN PO) Take 1 capsule daily by mouth.     No current facility-administered medications on file prior to visit.     BP 112/68 (BP Location: Right Arm, Patient Position: Sitting, Cuff Size: Normal)   Pulse (!) 103   Temp 98 F (36.7 C) (Oral)   Wt 231 lb 4.8 oz (104.9 kg)   SpO2 96%   BMI 35.17 kg/m       Objective:   Physical Exam  Constitutional: He is oriented to person, place, and time. He appears well-developed and well-nourished. No distress.  Abdominal: Soft. Bowel sounds are normal. A hernia (umbilical ) is present.  Neurological: He is alert and oriented to person, place, and time.  Skin: Skin is warm and dry. He is not diaphoretic.  Nursing note and vitals reviewed.     Assessment & Plan:  1. Diarrhea, unspecified  type -Please lactose intolerance to some degree.  Advised eating lactose for 1 to 2 weeks and then can slowly introduce lactose into diet to see if this affects his stools.  Follow-up as needed  2. Need for influenza vaccination  - Flu vaccine HIGH DOSE PF (Fluzone High dose)  Dorothyann Peng, NP

## 2017-10-17 DIAGNOSIS — N138 Other obstructive and reflux uropathy: Secondary | ICD-10-CM | POA: Diagnosis not present

## 2017-10-17 DIAGNOSIS — N401 Enlarged prostate with lower urinary tract symptoms: Secondary | ICD-10-CM | POA: Diagnosis not present

## 2017-10-17 DIAGNOSIS — R358 Other polyuria: Secondary | ICD-10-CM | POA: Diagnosis not present

## 2017-11-25 ENCOUNTER — Telehealth: Payer: Self-pay | Admitting: Adult Health

## 2017-11-25 ENCOUNTER — Ambulatory Visit (INDEPENDENT_AMBULATORY_CARE_PROVIDER_SITE_OTHER): Payer: Medicare Other | Admitting: Family Medicine

## 2017-11-25 ENCOUNTER — Encounter: Payer: Self-pay | Admitting: Family Medicine

## 2017-11-25 VITALS — BP 128/80 | HR 57 | Temp 98.3°F | Wt 232.2 lb

## 2017-11-25 DIAGNOSIS — R21 Rash and other nonspecific skin eruption: Secondary | ICD-10-CM

## 2017-11-25 MED ORDER — IMIQUIMOD 5 % EX CREA: TOPICAL_CREAM | Freq: Every day | CUTANEOUS | 2 refills | Status: DC

## 2017-11-25 NOTE — Progress Notes (Signed)
   Subjective:    Patient ID: Todd Larson, male    DOB: 1940/04/10, 77 y.o.   MRN: [id number]  HPI Here for an area of itchy red bumps on both wrists that appeared one week ago. No recent exposure to young children. He does have cats that roam around outside and then come into the house. He has been applying OTC cortisone cream with little effect.    Review of Systems  Constitutional: Negative.   Respiratory: Negative.   Cardiovascular: Negative.   Skin: Positive for rash.       Objective:   Physical Exam  Constitutional: He appears well-developed and well-nourished.  Cardiovascular: Normal rate, regular rhythm, normal heart sounds and intact distal pulses.  Pulmonary/Chest: Effort normal and breath sounds normal.  Skin:  The ventral surfaces of both wrists have a patch of isolated tiny red solid papules          Assessment & Plan:  Rash, possibly molluscum contagiosum. Apply Imiquimod cream daily. These should fade away over the next few weeks. Recheck prn.  Alysia Penna, MD

## 2017-11-25 NOTE — Telephone Encounter (Signed)
JoJo, have you seen this?

## 2017-11-25 NOTE — Telephone Encounter (Signed)
Copied from [place]. Topic: Quick Communication - See Telephone Encounter >> Nov 25, 2017  4:49 PM Percell Belt A wrote: CRM for notification. See Telephone encounter for: 11/25/17.  Pt called in and stated that the imiquimod (ALDARA) 5 % cream [[id number]] is needing a PA done on it.  Pharmacy was suppose to faxing over info.  [place], [place]. [phone] (Phone)

## 2017-11-26 ENCOUNTER — Encounter: Payer: Self-pay | Admitting: Family Medicine

## 2017-11-26 NOTE — Telephone Encounter (Signed)
Dr. Fry please advise. Thanks  

## 2017-11-26 NOTE — Telephone Encounter (Signed)
I have not received a PA for this patient.

## 2017-11-26 NOTE — Telephone Encounter (Signed)
I have not seen this. Message forwarded to [place] for prior auth.

## 2017-11-27 NOTE — Telephone Encounter (Signed)
Noted. He should still use the cream we discussed

## 2017-11-27 NOTE — Telephone Encounter (Signed)
Called the pharmacy and they stated that the wrong paper was faxed over---instead of sending the PA they sent a refill request.  I called and they will fax the PA to the office.

## 2017-11-29 ENCOUNTER — Encounter: Payer: Self-pay | Admitting: Internal Medicine

## 2017-11-29 ENCOUNTER — Ambulatory Visit (INDEPENDENT_AMBULATORY_CARE_PROVIDER_SITE_OTHER): Payer: Medicare Other | Admitting: Internal Medicine

## 2017-11-29 VITALS — BP 128/70 | HR 113 | Temp 98.4°F | Resp 16 | Wt 233.0 lb

## 2017-11-29 DIAGNOSIS — R21 Rash and other nonspecific skin eruption: Secondary | ICD-10-CM

## 2017-11-29 MED ORDER — PREDNISONE 10 MG PO TABS: ORAL_TABLET | ORAL | 0 refills | Status: DC

## 2017-11-29 MED ORDER — AZITHROMYCIN 250 MG PO TABS: ORAL_TABLET | ORAL | 0 refills | Status: DC

## 2017-11-29 NOTE — Patient Instructions (Addendum)
Take the steroid.  If there is no improvement after several days take the Zpak.    Call if no improvement

## 2017-11-29 NOTE — Progress Notes (Signed)
Subjective:    Patient ID: Todd Larson, male    DOB: 12-01-1940, 77 y.o.   MRN: [id number]  HPI The patient is here for an acute visit.  Rash: it started about two weeks ago.  It was initially on his b/l wrists and has spread to the rest of the body.  It now covers his legs, arms, abdomen needs a couple of spots on his face.  Initially the lesions look like little papules and then little red marks.  He states initially it is itchy, but then the itching fades.  Some areas are one spot in other areas clusters of papules/lesions.  He was seen for this 4 days ago and was prescribed imiquimod, which has not helped.  He states he was speaking to his brother recently and he had something very similar.  He states the Z-Pak seemed to have helped him.  He has a cat, but it is an indoor cat.  He lives in an apartment and has not been outside.  He denies any unusual exposures.  He denies any changes in medications, supplements or food.   Medications and allergies reviewed with patient and updated if appropriate.  Patient Active Problem List   Diagnosis Date Noted  . Right knee meniscal tear 06/01/2015  . Obstructive sleep apnea 02/07/2015  . Right elbow pain 01/21/2015  . Thoracic spine pain 03/29/2014  . Tenosynovitis of thumb 03/29/2014  . Cervical radiculopathy due to degenerative joint disease of spine 10/10/2013  . Bursitis of left shoulder 09/23/2013  . Chronic low back pain 12/15/2012  . Chronic cervical pain 06/09/2012  . Hyperlipidemia 07/06/2010  . Essential hypertension 11/03/2009  . INGUINAL HERNIA, LEFT 11/03/2009  . Nonallergic rhinitis 08/24/2009  . Umbilical hernia 37/16/9678  . VENTRAL HERNIA 05/20/2009  . DIVERTICULOSIS, COLON 05/20/2009  . NEVUS, ATYPICAL 10/26/2008  . GERD 10/01/2006    Current Outpatient Medications on File Prior to Visit  Medication Sig Dispense Refill  . cyclobenzaprine (FLEXERIL) 5 MG tablet Take 1 tablet (5 mg total) by mouth 3 (three) times  daily as needed for muscle spasms. 30 tablet 6  . finasteride (PROSCAR) 5 MG tablet Take 5 mg by mouth daily.    . fluticasone (FLONASE) 50 MCG/ACT nasal spray Place 1 spray into both nostrils 2 (two) times daily as needed for rhinitis. 48 g 3  . gabapentin (NEURONTIN) 100 MG capsule Take 2 capsules (200 mg total) by mouth at bedtime. (Patient taking differently: Take 200 mg by mouth at bedtime as needed. ) 60 capsule 1  . hydrochlorothiazide (HYDRODIURIL) 25 MG tablet TAKE 1 TABLET BY MOUTH ONCE DAILY 90 tablet 3  . imiquimod (ALDARA) 5 % cream Apply topically at bedtime. 24 each 2  . losartan (COZAAR) 50 MG tablet Take 1 tablet (50 mg total) by mouth daily. 90 tablet 3  . Polyvinyl Alcohol-Povidone (REFRESH OP) Place 1 drop as needed into both eyes (for dry eyes).    . Probiotic Product (ALIGN PO) Take 1 capsule daily by mouth.     No current facility-administered medications on file prior to visit.     Past Medical History:  Diagnosis Date  . Arthritis    cervical spine - T7-8 & lumbar area - bone spurs up & down the back   . Bulging discs     thoracic spine  . Diverticulosis of colon   . GERD (gastroesophageal reflux disease)    related to intestinal system, - has started Align  4-5 days  ago-   . Hyperlipidemia   . Hypertension   . Inguinal hernia   . OSA (obstructive sleep apnea)    saw Dr. Annamaria Boots- 02/2015- pt. remarks that as a result BP med. was changed , pt. has a sleep number bed & he reports the score averages in the 80% range   . PSA elevation    History of   . Recurrent sinus infections    related to fracture nose x2, also reports that he has a deviated septum    Past Surgical History:  Procedure Laterality Date  . COLONOSCOPY  2008   3 internal hemorrhoids, diverticulosis;Dr.Jacobs  . INSERTION OF MESH N/A 11/13/2016   Procedure: INSERTION OF MESH;  Surgeon: Ralene Ok, MD;  Location: [place];  Service: General;  Laterality: N/A;  . LAPAROSCOPIC INGUINAL HERNIA  WITH UMBILICAL HERNIA Left 00/86/7619   Procedure: LAPAROSCOPIC LEFT INGUINAL HERNIA  AND UMBILICAL HERNIA REPAIRS WITH MESH;  Surgeon: Ralene Ok, MD;  Location: [place];  Service: General;  Laterality: Left;  . PROSTATE BIOPSY     x 2, once abnormal, once normal  . TONSILLECTOMY      Social History   Socioeconomic History  . Marital status: Single    Spouse name: Not on file  . Number of children: 0  . Years of education: Not on file  . Highest education level: Not on file  Occupational History  . Occupation: retired  Scientific laboratory technician  . Financial resource strain: Not on file  . Food insecurity:    Worry: Not on file    Inability: Not on file  . Transportation needs:    Medical: Not on file    Non-medical: Not on file  Tobacco Use  . Smoking status: Never Smoker  . Smokeless tobacco: Never Used  Substance and Sexual Activity  . Alcohol use: Yes    Alcohol/week: 8.0 - 9.0 standard drinks    Types: 8 - 9 Glasses of wine per week    Comment: social/ drinks wine(white)  . Drug use: No  . Sexual activity: Not on file  Lifestyle  . Physical activity:    Days per week: Not on file    Minutes per session: Not on file  . Stress: Not on file  Relationships  . Social connections:    Talks on phone: Not on file    Gets together: Not on file    Attends religious service: Not on file    Active member of club or organization: Not on file    Attends meetings of clubs or organizations: Not on file    Relationship status: Not on file  Other Topics Concern  . 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        Family History  Problem Relation Age of Onset  . Lung cancer Maternal Grandfather        enviromental   . Heart attack Paternal Uncle   . Prostate cancer Brother     Review of Systems  Constitutional: Negative for chills and fever.  Respiratory: Negative for cough,  shortness of breath and wheezing.   Cardiovascular: Negative for chest pain, palpitations and leg swelling.  Skin: Positive for rash.  Neurological: Negative for light-headedness and headaches.       Objective:   Vitals:   11/29/17 1111  BP: 128/70  Pulse: (!) 113  Resp: 16  Temp: 98.4 F (36.9 C)  SpO2: 97%   BP Readings from Last 3 Encounters:  11/29/17 128/70  11/25/17 128/80  10/10/17 112/68   Wt Readings from Last 3 Encounters:  11/29/17 233 lb (105.7 kg)  11/25/17 232 lb 4 oz (105.3 kg)  10/10/17 231 lb 4.8 oz (104.9 kg)   Body mass index is 35.43 kg/m.   Physical Exam  Constitutional: He appears well-developed and well-nourished. No distress.  HENT:  Head: Normocephalic and atraumatic.  Skin: Skin is warm and dry. Rash (Clusters of rash on arms, legs, abdomen and face-ranges from a single lesion or papule to several in one area) noted. He is not diaphoretic.           Assessment & Plan:    See Problem List for Assessment and Plan of chronic medical problems.

## 2017-11-29 NOTE — Assessment & Plan Note (Signed)
Rash is possibly allergic in nature, but no obvious cause Since it has spread and is now more diffuse we will try a short prednisone taper to see if that helps He does have a cat-not typical of cat scratch disease, but he states the cat does scratch him all over and some of the areas that are affected If no improvement with the prednisone he will try the Z-Pak Call if no improvement

## 2017-12-02 ENCOUNTER — Encounter: Payer: Self-pay | Admitting: Internal Medicine

## 2017-12-04 ENCOUNTER — Telehealth: Payer: Self-pay | Admitting: *Deleted

## 2017-12-04 ENCOUNTER — Telehealth: Payer: Self-pay

## 2017-12-04 NOTE — Telephone Encounter (Signed)
My chart message sent to the pt.  Waiting on PA approval or denial

## 2017-12-04 NOTE — Telephone Encounter (Signed)
I have initiated the PA for the Imiquimod Cream 5 %.  This was completed and sen tto the plan via cover my meds.

## 2017-12-04 NOTE — Telephone Encounter (Signed)
Copied from [place] [id number]. Topic: General - Other >> Dec 04, 2017 12:45 PM Leward Quan A wrote: Reason for CRM: Alecia with [employer] called to say that imiquimod (ALDARA) 5 % cream have been approved. Form will be faxed to office.

## 2017-12-11 NOTE — Telephone Encounter (Signed)
Received a fax from [facility] stating that Imiquimod 5% EX cream has been approved.

## 2017-12-11 NOTE — Telephone Encounter (Signed)
Pt sent an email in and stated that the rash did get worse and he was seen at another office since our office was closed.  They started him on prednisone and told to stop the cream.

## 2017-12-11 NOTE — Telephone Encounter (Signed)
Pt sent in pt advise email and he stated that he does not need the cream now.

## 2018-01-15 ENCOUNTER — Ambulatory Visit (INDEPENDENT_AMBULATORY_CARE_PROVIDER_SITE_OTHER): Payer: Medicare Other | Admitting: Adult Health

## 2018-01-15 ENCOUNTER — Encounter: Payer: Self-pay | Admitting: Adult Health

## 2018-01-15 VITALS — BP 120/70 | Temp 98.9°F | Wt 233.0 lb

## 2018-01-15 DIAGNOSIS — D492 Neoplasm of unspecified behavior of bone, soft tissue, and skin: Secondary | ICD-10-CM

## 2018-01-15 NOTE — Progress Notes (Signed)
Subjective:    Patient ID: Todd Larson, male    DOB: April 26, 1940, 78 y.o.   MRN: [id number]  HPI  78 year old male who  has a past medical history of Arthritis, Bulging discs, Diverticulosis of colon, GERD (gastroesophageal reflux disease), Hyperlipidemia, Hypertension, Inguinal hernia, OSA (obstructive sleep apnea), PSA elevation, and Recurrent sinus infections.  He presents to the office today for a new abnormal spot on his right wrist. He has been seen by multiple providers for a rash throughout his body and was most recently given a prednisone injection and given a prescription for azithromycin to take if his rash did not clear. He never filled the azithromycin as his rash cleared.   Over the last week or so he has noticed a red raised area on his right wrist that has grown in size. Has irregular boarders. No drainage noted.   Review of Systems See HPI   Past Medical History:  Diagnosis Date  . Arthritis    cervical spine - T7-8 & lumbar area - bone spurs up & down the back   . Bulging discs     thoracic spine  . Diverticulosis of colon   . GERD (gastroesophageal reflux disease)    related to intestinal system, - has started Align  4-5 days ago-   . Hyperlipidemia   . Hypertension   . Inguinal hernia   . OSA (obstructive sleep apnea)    saw Dr. Annamaria Boots- 02/2015- pt. remarks that as a result BP med. was changed , pt. has a sleep number bed & he reports the score averages in the 80% range   . PSA elevation    History of   . Recurrent sinus infections    related to fracture nose x2, also reports that he has a deviated septum    Social History   Socioeconomic History  . Marital status: Single    Spouse name: Not on file  . Number of children: 0  . Years of education: Not on file  . Highest education level: Not on file  Occupational History  . Occupation: retired  Scientific laboratory technician  . Financial resource strain: Not on file  . Food insecurity:    Worry: Not on file   Inability: Not on file  . Transportation needs:    Medical: Not on file    Non-medical: Not on file  Tobacco Use  . Smoking status: Never Smoker  . Smokeless tobacco: Never Used  Substance and Sexual Activity  . Alcohol use: Yes    Alcohol/week: 8.0 - 9.0 standard drinks    Types: 8 - 9 Glasses of wine per week    Comment: social/ drinks wine(white)  . Drug use: No  . Sexual activity: Not on file  Lifestyle  . Physical activity:    Days per week: Not on file    Minutes per session: Not on file  . Stress: Not on file  Relationships  . Social connections:    Talks on phone: Not on file    Gets together: Not on file    Attends religious service: Not on file    Active member of club or organization: Not on file    Attends meetings of clubs or organizations: Not on file    Relationship status: Not on file  . Intimate partner violence:    Fear of current or ex partner: Not on file    Emotionally abused: Not on file    Physically abused: Not on file  Forced sexual activity: Not on file  Other Topics Concern  . 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        Past Surgical History:  Procedure Laterality Date  . COLONOSCOPY  2008   3 internal hemorrhoids, diverticulosis;Dr.Jacobs  . INSERTION OF MESH N/A 11/13/2016   Procedure: INSERTION OF MESH;  Surgeon: Ralene Ok, MD;  Location: [facility];  Service: General;  Laterality: N/A;  . LAPAROSCOPIC INGUINAL HERNIA WITH UMBILICAL HERNIA Left 67/12/4578   Procedure: LAPAROSCOPIC LEFT INGUINAL HERNIA  AND UMBILICAL HERNIA REPAIRS WITH MESH;  Surgeon: Ralene Ok, MD;  Location: [facility];  Service: General;  Laterality: Left;  . PROSTATE BIOPSY     x 2, once abnormal, once normal  . TONSILLECTOMY      Family History  Problem Relation Age of Onset  . Lung cancer Maternal Grandfather        enviromental   . Heart attack Paternal  Uncle   . Prostate cancer Brother     Allergies  Allergen Reactions  . Amoxicillin-Pot Clavulanate Diarrhea and Other (See Comments)    Night sweats, not allergic to Amoxicillin, just can't tolerate augmentin    Current Outpatient Medications on File Prior to Visit  Medication Sig Dispense Refill  . cyclobenzaprine (FLEXERIL) 5 MG tablet Take 1 tablet (5 mg total) by mouth 3 (three) times daily as needed for muscle spasms. 30 tablet 6  . finasteride (PROSCAR) 5 MG tablet Take 5 mg by mouth daily.    . fluticasone (FLONASE) 50 MCG/ACT nasal spray Place 1 spray into both nostrils 2 (two) times daily as needed for rhinitis. 48 g 3  . gabapentin (NEURONTIN) 100 MG capsule Take 2 capsules (200 mg total) by mouth at bedtime. (Patient taking differently: Take 200 mg by mouth at bedtime as needed. ) 60 capsule 1  . hydrochlorothiazide (HYDRODIURIL) 25 MG tablet TAKE 1 TABLET BY MOUTH ONCE DAILY 90 tablet 3  . losartan (COZAAR) 50 MG tablet Take 1 tablet (50 mg total) by mouth daily. 90 tablet 3  . Polyvinyl Alcohol-Povidone (REFRESH OP) Place 1 drop as needed into both eyes (for dry eyes).    . Probiotic Product (ALIGN PO) Take 1 capsule daily by mouth.     No current facility-administered medications on file prior to visit.     BP 120/70   Temp 98.9 F (37.2 C)   Wt 233 lb (105.7 kg)   BMI 35.43 kg/m       Objective:   Physical Exam Vitals signs and nursing note reviewed.  Constitutional:      Appearance: Normal appearance.  Musculoskeletal: Normal range of motion.  Skin:    General: Skin is warm and dry.     Findings: Lesion present.     Comments: Dime sized red, slight raised lesion on medial aspect of right wrist. Has irregular boarders. No vesicles or pustules noted   Neurological:     General: No focal deficit present.     Mental Status: He is alert and oriented to person, place, and time. Mental status is at baseline.       Assessment & Plan:  1. Skin neoplasm -  There is some concern for SCC. Does not appear as melanoma  - Due to rapid progression and irregular boarders, I am going to have him follow up  with his dermatologist   Dorothyann Peng, NP

## 2018-01-24 ENCOUNTER — Encounter: Payer: Self-pay | Admitting: Adult Health

## 2018-01-24 DIAGNOSIS — X32XXXD Exposure to sunlight, subsequent encounter: Secondary | ICD-10-CM | POA: Diagnosis not present

## 2018-01-24 DIAGNOSIS — L570 Actinic keratosis: Secondary | ICD-10-CM | POA: Diagnosis not present

## 2018-01-24 DIAGNOSIS — Z1283 Encounter for screening for malignant neoplasm of skin: Secondary | ICD-10-CM | POA: Diagnosis not present

## 2018-01-24 DIAGNOSIS — D0461 Carcinoma in situ of skin of right upper limb, including shoulder: Secondary | ICD-10-CM | POA: Diagnosis not present

## 2018-01-24 DIAGNOSIS — D225 Melanocytic nevi of trunk: Secondary | ICD-10-CM | POA: Diagnosis not present

## 2018-03-20 DIAGNOSIS — H2513 Age-related nuclear cataract, bilateral: Secondary | ICD-10-CM | POA: Diagnosis not present

## 2018-03-31 ENCOUNTER — Other Ambulatory Visit: Payer: Self-pay | Admitting: Adult Health

## 2018-03-31 DIAGNOSIS — I10 Essential (primary) hypertension: Secondary | ICD-10-CM

## 2018-03-31 NOTE — Telephone Encounter (Signed)
Sent to the pharmacy by e-scribe. 

## 2018-04-02 ENCOUNTER — Encounter: Payer: Self-pay | Admitting: Adult Health

## 2018-04-03 ENCOUNTER — Other Ambulatory Visit: Payer: Self-pay

## 2018-04-03 ENCOUNTER — Encounter: Payer: Self-pay | Admitting: Adult Health

## 2018-04-03 ENCOUNTER — Ambulatory Visit (INDEPENDENT_AMBULATORY_CARE_PROVIDER_SITE_OTHER): Payer: Medicare Other | Admitting: Adult Health

## 2018-04-03 DIAGNOSIS — J302 Other seasonal allergic rhinitis: Secondary | ICD-10-CM

## 2018-04-03 NOTE — Progress Notes (Signed)
Virtual Visit via Video Note  I connected with@ on 04/03/18 at  2:30 PM EDT by a video enabled telemedicine application and verified that I am speaking with the correct person using two identifiers.  Location patient: home Location provider:work or home office Persons participating in the virtual visit: patient, provider  I discussed the limitations of evaluation and management by telemedicine and the availability of in person appointments. The patient expressed understanding and agreed to proceed.   HPI: Todd Larson 78-year-old male who is being evaluated today for concern of URI seasonal allergies.  He reports waxing and waning nasal congestion for approximately 1 month.  Has had episodes of a productive cough with thick white mucus, this is mostly in the morning when he wakes up, some mild frontal sinus pressure intermittently.  Reports that he has been using Flonase due to seasonal allergies.  He denies any shortness of breath, wheezing, body aches, fever, chills, or feeling acutely ill.   ROS: See pertinent positives and negatives per HPI.  Past Medical History:  Diagnosis Date  . Arthritis    cervical spine - T7-8 & lumbar area - bone spurs up & down the back   . Bulging discs     thoracic spine  . Diverticulosis of colon   . GERD (gastroesophageal reflux disease)    related to intestinal system, - has started Align  4-5 days ago-   . Hyperlipidemia   . Hypertension   . Inguinal hernia   . OSA (obstructive sleep apnea)    saw Dr. Annamaria Boots- 02/2015- pt. remarks that as a result BP med. was changed , pt. has a sleep number bed & he reports the score averages in the 80% range   . PSA elevation    History of   . Recurrent sinus infections    related to fracture nose x2, also reports that he has a deviated septum    Past Surgical History:  Procedure Laterality Date  . COLONOSCOPY  2008   3 internal hemorrhoids, diverticulosis;Dr.Jacobs  . INSERTION OF MESH N/A 11/13/2016   Procedure:  INSERTION OF MESH;  Surgeon: Ralene Ok, MD;  Location: [facility];  Service: General;  Laterality: N/A;  . LAPAROSCOPIC INGUINAL HERNIA WITH UMBILICAL HERNIA Left 35/45/6256   Procedure: LAPAROSCOPIC LEFT INGUINAL HERNIA  AND UMBILICAL HERNIA REPAIRS WITH MESH;  Surgeon: Ralene Ok, MD;  Location: [facility];  Service: General;  Laterality: Left;  . PROSTATE BIOPSY     x 2, once abnormal, once normal  . TONSILLECTOMY      Family History  Problem Relation Age of Onset  . Lung cancer Maternal Grandfather        enviromental   . Heart attack Paternal Uncle   . Prostate cancer Brother       Current Outpatient Medications:  .  cyclobenzaprine (FLEXERIL) 5 MG tablet, Take 1 tablet (5 mg total) by mouth 3 (three) times daily as needed for muscle spasms., Disp: 30 tablet, Rfl: 6 .  finasteride (PROSCAR) 5 MG tablet, Take 5 mg by mouth daily., Disp: , Rfl:  .  fluticasone (FLONASE) 50 MCG/ACT nasal spray, Place 1 spray into both nostrils 2 (two) times daily as needed for rhinitis., Disp: 48 g, Rfl: 3 .  gabapentin (NEURONTIN) 100 MG capsule, Take 2 capsules (200 mg total) by mouth at bedtime. (Patient taking differently: Take 200 mg by mouth at bedtime as needed. ), Disp: 60 capsule, Rfl: 1 .  hydrochlorothiazide (HYDRODIURIL) 25 MG tablet, TAKE 1 TABLET BY  MOUTH ONCE DAILY, Disp: 90 tablet, Rfl: 3 .  losartan (COZAAR) 50 MG tablet, Take 1 tablet by mouth once daily, Disp: 90 tablet, Rfl: 0 .  Polyvinyl Alcohol-Povidone (REFRESH OP), Place 1 drop as needed into both eyes (for dry eyes)., Disp: , Rfl:  .  Probiotic Product (ALIGN PO), Take 1 capsule daily by mouth., Disp: , Rfl:   EXAM:  VITALS per patient if applicable:  GENERAL: alert, oriented, appears well and in no acute distress  HEENT: atraumatic, conjunttiva clear, no obvious abnormalities on inspection of external nose and ears  NECK: normal movements of the head and neck  LUNGS: on inspection no signs of respiratory distress,  breathing rate appears normal, no obvious gross SOB, gasping or wheezing  CV: no obvious cyanosis  MS: moves all visible extremities without noticeable abnormality  PSYCH/NEURO: pleasant and cooperative, no obvious depression or anxiety, speech and thought processing grossly intact  ASSESSMENT AND PLAN:  Discussed the following assessment and plan:  Seasonal allergies  Exam appears to be more seasonal allergy related.  No concern for bacterial sinusitis at this point in time or other upper respiratory infections.  Advised to continue Flonase and add either Claritin or Zyrtec to help with symptom relief.   I discussed the assessment and treatment plan with the patient. The patient was provided an opportunity to ask questions and all were answered. The patient agreed with the plan and demonstrated an understanding of the instructions.   The patient was advised to call back or seek an in-person evaluation if the symptoms worsen or if the condition fails to improve as anticipated.   Dorothyann Peng, NP

## 2018-04-16 ENCOUNTER — Ambulatory Visit: Payer: Medicare Other | Admitting: Adult Health

## 2018-04-17 DIAGNOSIS — N401 Enlarged prostate with lower urinary tract symptoms: Secondary | ICD-10-CM | POA: Diagnosis not present

## 2018-04-17 DIAGNOSIS — N138 Other obstructive and reflux uropathy: Secondary | ICD-10-CM | POA: Diagnosis not present

## 2018-04-20 ENCOUNTER — Other Ambulatory Visit: Payer: Self-pay | Admitting: Adult Health

## 2018-04-20 DIAGNOSIS — I10 Essential (primary) hypertension: Secondary | ICD-10-CM

## 2018-04-21 NOTE — Telephone Encounter (Signed)
Sent to the pharmacy by e-scribe. 

## 2018-05-13 DIAGNOSIS — D0461 Carcinoma in situ of skin of right upper limb, including shoulder: Secondary | ICD-10-CM | POA: Diagnosis not present

## 2018-05-21 ENCOUNTER — Other Ambulatory Visit: Payer: Self-pay | Admitting: Adult Health

## 2018-05-21 DIAGNOSIS — H1010 Acute atopic conjunctivitis, unspecified eye: Secondary | ICD-10-CM

## 2018-05-22 NOTE — Telephone Encounter (Signed)
Sent to the pharmacy by e-scribe. 

## 2018-07-03 ENCOUNTER — Other Ambulatory Visit: Payer: Self-pay | Admitting: Adult Health

## 2018-07-03 DIAGNOSIS — I10 Essential (primary) hypertension: Secondary | ICD-10-CM

## 2018-07-16 ENCOUNTER — Ambulatory Visit (INDEPENDENT_AMBULATORY_CARE_PROVIDER_SITE_OTHER): Payer: Medicare Other | Admitting: Adult Health

## 2018-07-16 ENCOUNTER — Encounter: Payer: Self-pay | Admitting: Adult Health

## 2018-07-16 ENCOUNTER — Other Ambulatory Visit: Payer: Self-pay

## 2018-07-16 VITALS — BP 144/84 | Temp 98.6°F | Ht 68.25 in | Wt 229.0 lb

## 2018-07-16 DIAGNOSIS — I10 Essential (primary) hypertension: Secondary | ICD-10-CM | POA: Diagnosis not present

## 2018-07-16 DIAGNOSIS — N401 Enlarged prostate with lower urinary tract symptoms: Secondary | ICD-10-CM | POA: Diagnosis not present

## 2018-07-16 DIAGNOSIS — E782 Mixed hyperlipidemia: Secondary | ICD-10-CM | POA: Diagnosis not present

## 2018-07-16 LAB — CBC WITH DIFFERENTIAL/PLATELET
Basophils Absolute: 0.1 10*3/uL (ref 0.0–0.1)
Basophils Relative: 1 % (ref 0.0–3.0)
Eosinophils Absolute: 0.1 10*3/uL (ref 0.0–0.7)
Eosinophils Relative: 1.5 % (ref 0.0–5.0)
HCT: 53.4 % — ABNORMAL HIGH (ref 39.0–52.0)
Hemoglobin: 17.9 g/dL — ABNORMAL HIGH (ref 13.0–17.0)
Lymphocytes Relative: 22.2 % (ref 12.0–46.0)
Lymphs Abs: 1.9 10*3/uL (ref 0.7–4.0)
MCHC: 33.6 g/dL (ref 30.0–36.0)
MCV: 93.6 fl (ref 78.0–100.0)
Monocytes Absolute: 0.7 10*3/uL (ref 0.1–1.0)
Monocytes Relative: 8.6 % (ref 3.0–12.0)
Neutro Abs: 5.6 10*3/uL (ref 1.4–7.7)
Neutrophils Relative %: 66.7 % (ref 43.0–77.0)
Platelets: 295 10*3/uL (ref 150.0–400.0)
RBC: 5.7 Mil/uL (ref 4.22–5.81)
RDW: 13.2 % (ref 11.5–15.5)
WBC: 8.4 10*3/uL (ref 4.0–10.5)

## 2018-07-16 LAB — LIPID PANEL
Cholesterol: 155 mg/dL (ref 0–200)
HDL: 41.2 mg/dL (ref >39.00)
LDL Cholesterol: 99 mg/dL (ref 0–99)
NonHDL: 114.27
Total CHOL/HDL Ratio: 4
Triglycerides: 76 mg/dL (ref 0.0–149.0)
VLDL: 15.2 mg/dL (ref 0.0–40.0)

## 2018-07-16 LAB — TSH: TSH: 2.03 u[IU]/mL (ref 0.35–4.50)

## 2018-07-16 LAB — COMPREHENSIVE METABOLIC PANEL
ALT: 24 U/L (ref 0–53)
AST: 21 U/L (ref 0–37)
Albumin: 4.5 g/dL (ref 3.5–5.2)
Alkaline Phosphatase: 57 U/L (ref 39–117)
BUN: 15 mg/dL (ref 6–23)
CO2: 30 mEq/L (ref 19–32)
Calcium: 9.3 mg/dL (ref 8.4–10.5)
Chloride: 102 mEq/L (ref 96–112)
Creatinine, Ser: 1.02 mg/dL (ref 0.40–1.50)
GFR: 71.01 mL/min (ref >60.00)
Glucose, Bld: 100 mg/dL — ABNORMAL HIGH (ref 70–99)
Potassium: 4 mEq/L (ref 3.5–5.1)
Sodium: 142 mEq/L (ref 135–145)
Total Bilirubin: 1.7 mg/dL — ABNORMAL HIGH (ref 0.2–1.2)
Total Protein: 6.6 g/dL (ref 6.0–8.3)

## 2018-07-16 NOTE — Progress Notes (Signed)
Subjective:    Patient ID: Todd Larson, male    DOB: 05/22/1940, 78 y.o.   MRN: [id number]  HPI  Patient presents for yearly preventative medicine examination. He is a pleasant 78 year old male who  has a past medical history of Arthritis, Bulging discs, Diverticulosis of colon, GERD (gastroesophageal reflux disease), Hyperlipidemia, Hypertension, Inguinal hernia, OSA (obstructive sleep apnea), PSA elevation, and Recurrent sinus infections.   H/O BPH- takes Proscar 5 mg daily - well controlled. He is seen by Urology ( Dr. Hazle Nordmann. He was last seen by Urology on 04/17/2018   Essential Hypertension -takes HCTZ 25 mg and losartan 50 mg.  He denies chest pain, shortness of breath, syncopal episodes, headaches, or dizziness. He did not take his medication this morning.  BP Readings from Last 3 Encounters:  07/16/18 (!) 144/84  01/15/18 120/70  11/29/17 128/70   Hyperlipidemia - has history of. Currently diet controlled.   All immunizations and health maintenance protocols were reviewed with the patient and needed orders were placed. Vaccinations are up to date   Appropriate screening laboratory values were ordered for the patient including screening of hyperlipidemia, renal function and hepatic function. If indicated by BPH, a PSA was ordered.  Medication reconciliation,  past medical history, social history, problem list and allergies were reviewed in detail with the patient  Goals were established with regard to weight loss, exercise, and  diet in compliance with medications  Wt Readings from Last 3 Encounters:  07/16/18 229 lb (103.9 kg)  01/15/18 233 lb (105.7 kg)  11/29/17 233 lb (105.7 kg)     End of life planning was discussed.  He is up-to-date on routine dental and vision screens.  Cologuard was done in 2018 which was negative.  He does not need further colon cancer screening due to age   Review of Systems  Constitutional: Negative.   HENT: Negative.   Eyes:  Negative.   Respiratory: Negative.   Cardiovascular: Negative.   Gastrointestinal: Negative.   Endocrine: Negative.   Genitourinary: Negative.   Musculoskeletal: Positive for arthralgias and back pain.  Skin: Negative.   Allergic/Immunologic: Negative.   Neurological: Negative.   Hematological: Negative.   Psychiatric/Behavioral: Negative.   All other systems reviewed and are negative.  Past Medical History:  Diagnosis Date  . Arthritis    cervical spine - T7-8 & lumbar area - bone spurs up & down the back   . Bulging discs     thoracic spine  . Diverticulosis of colon   . GERD (gastroesophageal reflux disease)    related to intestinal system, - has started Align  4-5 days ago-   . Hyperlipidemia   . Hypertension   . Inguinal hernia   . OSA (obstructive sleep apnea)    saw Dr. Annamaria Boots- 02/2015- pt. remarks that as a result BP med. was changed , pt. has a sleep number bed & he reports the score averages in the 80% range   . PSA elevation    History of   . Recurrent sinus infections    related to fracture nose x2, also reports that he has a deviated septum    Social History   Socioeconomic History  . Marital status: Single    Spouse name: Not on file  . Number of children: 0  . Years of education: Not on file  . Highest education level: Not on file  Occupational History  . Occupation: retired  Scientific laboratory technician  . Financial resource strain: Not  on file  . Food insecurity    Worry: Not on file    Inability: Not on file  . Transportation needs    Medical: Not on file    Non-medical: Not on file  Tobacco Use  . Smoking status: Never Smoker  . Smokeless tobacco: Never Used  Substance and Sexual Activity  . Alcohol use: Yes    Alcohol/week: 8.0 - 9.0 standard drinks    Types: 8 - 9 Glasses of wine per week    Comment: social/ drinks wine(white)  . Drug use: No  . Sexual activity: Not on file  Lifestyle  . Physical activity    Days per week: Not on file    Minutes per  session: Not on file  . Stress: Not on file  Relationships  . Social Herbalist on phone: Not on file    Gets together: Not on file    Attends religious service: Not on file    Active member of club or organization: Not on file    Attends meetings of clubs or organizations: Not on file    Relationship status: Not on file  . Intimate partner violence    Fear of current or ex partner: Not on file    Emotionally abused: Not on file    Physically abused: Not on file    Forced sexual activity: Not on file  Other Topics Concern  . 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        Past Surgical History:  Procedure Laterality Date  . COLONOSCOPY  2008   3 internal hemorrhoids, diverticulosis;Dr.Jacobs  . INSERTION OF MESH N/A 11/13/2016   Procedure: INSERTION OF MESH;  Surgeon: Ralene Ok, MD;  Location: [facility];  Service: General;  Laterality: N/A;  . LAPAROSCOPIC INGUINAL HERNIA WITH UMBILICAL HERNIA Left 50/27/7412   Procedure: LAPAROSCOPIC LEFT INGUINAL HERNIA  AND UMBILICAL HERNIA REPAIRS WITH MESH;  Surgeon: Ralene Ok, MD;  Location: [facility];  Service: General;  Laterality: Left;  . PROSTATE BIOPSY     x 2, once abnormal, once normal  . TONSILLECTOMY      Family History  Problem Relation Age of Onset  . Lung cancer Maternal Grandfather        enviromental   . Heart attack Paternal Uncle   . Prostate cancer Brother     Allergies  Allergen Reactions  . Amoxicillin-Pot Clavulanate Diarrhea and Other (See Comments)    Night sweats, not allergic to Amoxicillin, just can't tolerate augmentin    Current Outpatient Medications on File Prior to Visit  Medication Sig Dispense Refill  . cyclobenzaprine (FLEXERIL) 5 MG tablet Take 1 tablet (5 mg total) by mouth 3 (three) times daily as needed for muscle spasms. 30 tablet 6  . finasteride (PROSCAR) 5 MG tablet Take 5  mg by mouth daily.    . fluticasone (FLONASE) 50 MCG/ACT nasal spray USE 1 SPRAY(S) IN EACH NOSTRIL TWICE DAILY AS NEEDED FOR RHINITIS 48 g 0  . gabapentin (NEURONTIN) 100 MG capsule Take 2 capsules (200 mg total) by mouth at bedtime. (Patient taking differently: Take 200 mg by mouth at bedtime as needed. ) 60 capsule 1  . hydrochlorothiazide (HYDRODIURIL) 25 MG tablet Take 1 tablet by mouth once daily 90 tablet 0  . losartan (COZAAR) 50 MG tablet Take  1 tablet by mouth once daily 90 tablet 1  . Polyvinyl Alcohol-Povidone (REFRESH OP) Place 1 drop as needed into both eyes (for dry eyes).     No current facility-administered medications on file prior to visit.     BP (!) 144/84 Comment: NO MEDICATION  Temp 98.6 F (37 C)   Ht 5' 8.25" (1.734 m)   Wt 229 lb (103.9 kg)   BMI 34.56 kg/m       Objective:   Physical Exam Vitals signs and nursing note reviewed.  Constitutional:      General: He is not in acute distress.    Appearance: Normal appearance. He is obese. He is not diaphoretic.  HENT:     Head: Normocephalic and atraumatic.     Right Ear: Tympanic membrane, ear canal and external ear normal. There is no impacted cerumen.     Left Ear: Tympanic membrane, ear canal and external ear normal. There is no impacted cerumen.     Nose: Nose normal. No congestion or rhinorrhea.     Mouth/Throat:     Mouth: Mucous membranes are moist.     Pharynx: Oropharynx is clear. No oropharyngeal exudate or posterior oropharyngeal erythema.  Eyes:     General: No scleral icterus.       Right eye: No discharge.        Left eye: No discharge.     Conjunctiva/sclera: Conjunctivae normal.     Pupils: Pupils are equal, round, and reactive to light.  Neck:     Musculoskeletal: Normal range of motion and neck supple.     Thyroid: No thyromegaly.     Vascular: No carotid bruit or JVD.     Trachea: No tracheal deviation.  Cardiovascular:     Rate and Rhythm: Normal rate and regular rhythm.      Heart sounds: Normal heart sounds. No murmur. No friction rub. No gallop.   Pulmonary:     Effort: Pulmonary effort is normal. No respiratory distress.     Breath sounds: Normal breath sounds. No stridor. No wheezing, rhonchi or rales.  Chest:     Chest wall: No tenderness.  Abdominal:     General: Bowel sounds are normal. There is no distension.     Palpations: Abdomen is soft. There is no mass.     Tenderness: There is no abdominal tenderness. There is no right CVA tenderness, left CVA tenderness, guarding or rebound.     Hernia: No hernia is present.  Musculoskeletal: Normal range of motion.        General: No swelling, tenderness, deformity or signs of injury.     Right lower leg: 1+ Pitting Edema present.     Left lower leg: 1+ Pitting Edema present.  Lymphadenopathy:     Cervical: No cervical adenopathy.  Skin:    General: Skin is warm and dry.     Capillary Refill: Capillary refill takes less than 2 seconds.     Coloration: Skin is not jaundiced or pale.     Findings: No bruising, erythema, lesion or rash.  Neurological:     General: No focal deficit present.     Mental Status: He is alert and oriented to person, place, and time.     Cranial Nerves: No cranial nerve deficit.     Sensory: No sensory deficit.     Motor: No weakness or abnormal muscle tone.     Coordination: Coordination normal.     Gait: Gait normal.  Deep Tendon Reflexes: Reflexes are normal and symmetric. Reflexes normal.  Psychiatric:        Mood and Affect: Mood normal.        Behavior: Behavior normal.        Thought Content: Thought content normal.        Judgment: Judgment normal.       Assessment & Plan:  1. Essential hypertension - Well controlled. Did not take medication this morning  - Advised weight loss through diet and exercise  - Follow up in one year or sooner if needed - CBC with Differential/Platelet - Comprehensive metabolic panel - Lipid panel - TSH  2. Mixed hyperlipidemia  - Consider statin  - CBC with Differential/Platelet - Comprehensive metabolic panel - Lipid panel - TSH  3. Benign prostatic hyperplasia with lower urinary tract symptoms, symptom details unspecified - Follow up with Urology as directed   Dorothyann Peng, NP

## 2018-07-16 NOTE — Patient Instructions (Signed)
It was great seeing you today   We will follow up with you regarding your blood work   Work on weight loss through diet and exercise  Have a very Happy Birthday!

## 2018-07-22 ENCOUNTER — Other Ambulatory Visit: Payer: Self-pay | Admitting: Adult Health

## 2018-07-22 DIAGNOSIS — I10 Essential (primary) hypertension: Secondary | ICD-10-CM

## 2018-07-23 ENCOUNTER — Encounter: Payer: Self-pay | Admitting: Adult Health

## 2018-07-23 DIAGNOSIS — I10 Essential (primary) hypertension: Secondary | ICD-10-CM

## 2018-07-23 NOTE — Telephone Encounter (Signed)
Sent to the pharmacy by e-scribe. 

## 2018-07-23 NOTE — Telephone Encounter (Signed)
Encounter opened in error

## 2018-08-27 ENCOUNTER — Encounter: Payer: Self-pay | Admitting: Adult Health

## 2018-09-27 ENCOUNTER — Other Ambulatory Visit: Payer: Self-pay

## 2018-09-27 ENCOUNTER — Ambulatory Visit (INDEPENDENT_AMBULATORY_CARE_PROVIDER_SITE_OTHER): Payer: Medicare Other

## 2018-09-27 DIAGNOSIS — Z23 Encounter for immunization: Secondary | ICD-10-CM | POA: Diagnosis not present

## 2018-10-07 DIAGNOSIS — M5031 Other cervical disc degeneration,  high cervical region: Secondary | ICD-10-CM | POA: Diagnosis not present

## 2018-10-07 DIAGNOSIS — M9903 Segmental and somatic dysfunction of lumbar region: Secondary | ICD-10-CM | POA: Diagnosis not present

## 2018-10-07 DIAGNOSIS — M5136 Other intervertebral disc degeneration, lumbar region: Secondary | ICD-10-CM | POA: Diagnosis not present

## 2018-10-07 DIAGNOSIS — M5134 Other intervertebral disc degeneration, thoracic region: Secondary | ICD-10-CM | POA: Diagnosis not present

## 2018-10-07 DIAGNOSIS — M9902 Segmental and somatic dysfunction of thoracic region: Secondary | ICD-10-CM | POA: Diagnosis not present

## 2018-10-07 DIAGNOSIS — M9901 Segmental and somatic dysfunction of cervical region: Secondary | ICD-10-CM | POA: Diagnosis not present

## 2018-10-09 ENCOUNTER — Encounter: Payer: Self-pay | Admitting: Adult Health

## 2018-10-09 ENCOUNTER — Other Ambulatory Visit: Payer: Self-pay

## 2018-10-09 ENCOUNTER — Ambulatory Visit (INDEPENDENT_AMBULATORY_CARE_PROVIDER_SITE_OTHER): Payer: Medicare Other | Admitting: Adult Health

## 2018-10-09 VITALS — BP 124/60 | Temp 97.2°F | Wt 228.0 lb

## 2018-10-09 DIAGNOSIS — M79622 Pain in left upper arm: Secondary | ICD-10-CM

## 2018-10-09 DIAGNOSIS — T148XXA Other injury of unspecified body region, initial encounter: Secondary | ICD-10-CM

## 2018-10-09 NOTE — Progress Notes (Signed)
Subjective:    Patient ID: Todd Larson, male    DOB: Feb 18, 1940, 78 y.o.   MRN: [id number]  HPI 78 year old male who  has a past medical history of Arthritis, Bulging discs, Diverticulosis of colon, GERD (gastroesophageal reflux disease), Hyperlipidemia, Hypertension, Inguinal hernia, OSA (obstructive sleep apnea), PSA elevation, and Recurrent sinus infections.  He presents to the office today for an acute issue of pain in his left armpit.  Pain is described as a "ache", is intermittnet and has been present for 2 days, has been improving.  Pain started soon after being seen by his chiropractor.  Denies numbness or tingling down left arm, cervical spine pain,shoulder pain, or decreased ROM  Review of Systems See HPI   Past Medical History:  Diagnosis Date  . Arthritis    cervical spine - T7-8 & lumbar area - bone spurs up & down the back   . Bulging discs     thoracic spine  . Diverticulosis of colon   . GERD (gastroesophageal reflux disease)    related to intestinal system, - has started Align  4-5 days ago-   . Hyperlipidemia   . Hypertension   . Inguinal hernia   . OSA (obstructive sleep apnea)    saw Dr. Annamaria Boots- 02/2015- pt. remarks that as a result BP med. was changed , pt. has a sleep number bed & he reports the score averages in the 80% range   . PSA elevation    History of   . Recurrent sinus infections    related to fracture nose x2, also reports that he has a deviated septum    Social History   Socioeconomic History  . Marital status: Single    Spouse name: Not on file  . Number of children: 0  . Years of education: Not on file  . Highest education level: Not on file  Occupational History  . Occupation: retired  Scientific laboratory technician  . Financial resource strain: Not on file  . Food insecurity    Worry: Not on file    Inability: Not on file  . Transportation needs    Medical: Not on file    Non-medical: Not on file  Tobacco Use  . Smoking status: Never Smoker  .  Smokeless tobacco: Never Used  Substance and Sexual Activity  . Alcohol use: Yes    Alcohol/week: 8.0 - 9.0 standard drinks    Types: 8 - 9 Glasses of wine per week    Comment: social/ drinks wine(white)  . Drug use: No  . Sexual activity: Not on file  Lifestyle  . Physical activity    Days per week: Not on file    Minutes per session: Not on file  . Stress: Not on file  Relationships  . Social Herbalist on phone: Not on file    Gets together: Not on file    Attends religious service: Not on file    Active member of club or organization: Not on file    Attends meetings of clubs or organizations: Not on file    Relationship status: Not on file  . Intimate partner violence    Fear of current or ex partner: Not on file    Emotionally abused: Not on file    Physically abused: Not on file    Forced sexual activity: Not on file  Other Topics Concern  . Not on file  Social History Narrative   Retired    Single  No kids       He likes to read, volunteers with an animal rescue group.        Past Surgical History:  Procedure Laterality Date  . COLONOSCOPY  2008   3 internal hemorrhoids, diverticulosis;Dr.Jacobs  . INSERTION OF MESH N/A 11/13/2016   Procedure: INSERTION OF MESH;  Surgeon: Ralene Ok, MD;  Location: [facility];  Service: General;  Laterality: N/A;  . LAPAROSCOPIC INGUINAL HERNIA WITH UMBILICAL HERNIA Left [id number]   Procedure: LAPAROSCOPIC LEFT INGUINAL HERNIA  AND UMBILICAL HERNIA REPAIRS WITH MESH;  Surgeon: Ralene Ok, MD;  Location: [facility];  Service: General;  Laterality: Left;  . PROSTATE BIOPSY     x 2, once abnormal, once normal  . TONSILLECTOMY      Family History  Problem Relation Age of Onset  . Lung cancer Maternal Grandfather        enviromental   . Heart attack Paternal Uncle   . Prostate cancer Brother     Allergies  Allergen Reactions  . Amoxicillin-Pot Clavulanate Diarrhea and Other (See Comments)    Night sweats,  not allergic to Amoxicillin, just can't tolerate augmentin    Current Outpatient Medications on File Prior to Visit  Medication Sig Dispense Refill  . cyclobenzaprine (FLEXERIL) 5 MG tablet Take 1 tablet (5 mg total) by mouth 3 (three) times daily as needed for muscle spasms. 30 tablet 6  . finasteride (PROSCAR) 5 MG tablet Take 5 mg by mouth daily.    . fluticasone (FLONASE) 50 MCG/ACT nasal spray USE 1 SPRAY(S) IN EACH NOSTRIL TWICE DAILY AS NEEDED FOR RHINITIS 48 g 0  . gabapentin (NEURONTIN) 100 MG capsule Take 2 capsules (200 mg total) by mouth at bedtime. (Patient taking differently: Take 200 mg by mouth at bedtime as needed. ) 60 capsule 1  . hydrochlorothiazide (HYDRODIURIL) 25 MG tablet Take 1 tablet by mouth once daily 90 tablet 3  . losartan (COZAAR) 50 MG tablet Take 1 tablet by mouth once daily 90 tablet 1  . Polyvinyl Alcohol-Povidone (REFRESH OP) Place 1 drop as needed into both eyes (for dry eyes).     No current facility-administered medications on file prior to visit.     BP 124/60   Temp (!) 97.2 F (36.2 C) (Temporal)   Wt 228 lb (103.4 kg)   BMI 34.41 kg/m       Objective:   Physical Exam Vitals signs and nursing note reviewed.  Constitutional:      Appearance: Normal appearance.  Musculoskeletal: Normal range of motion.        General: Tenderness (tenderness with palpation along teres major) present. No deformity or signs of injury.     Right lower leg: No edema.     Left lower leg: No edema.  Skin:    General: Skin is warm and dry.     Capillary Refill: Capillary refill takes less than 2 seconds.  Neurological:     General: No focal deficit present.     Mental Status: He is alert and oriented to person, place, and time.       Assessment & Plan:  1. Muscle strain -Muscle versus ligament strain. -Conservative measures including gentle stretching exercises and cool compress -Follow-up as needed  Dorothyann Peng, NP

## 2018-10-14 DIAGNOSIS — M9902 Segmental and somatic dysfunction of thoracic region: Secondary | ICD-10-CM | POA: Diagnosis not present

## 2018-10-14 DIAGNOSIS — M9903 Segmental and somatic dysfunction of lumbar region: Secondary | ICD-10-CM | POA: Diagnosis not present

## 2018-10-14 DIAGNOSIS — M5031 Other cervical disc degeneration,  high cervical region: Secondary | ICD-10-CM | POA: Diagnosis not present

## 2018-10-14 DIAGNOSIS — M9901 Segmental and somatic dysfunction of cervical region: Secondary | ICD-10-CM | POA: Diagnosis not present

## 2018-10-14 DIAGNOSIS — M5136 Other intervertebral disc degeneration, lumbar region: Secondary | ICD-10-CM | POA: Diagnosis not present

## 2018-10-14 DIAGNOSIS — M5134 Other intervertebral disc degeneration, thoracic region: Secondary | ICD-10-CM | POA: Diagnosis not present

## 2018-10-16 DIAGNOSIS — R358 Other polyuria: Secondary | ICD-10-CM | POA: Diagnosis not present

## 2018-10-16 DIAGNOSIS — N401 Enlarged prostate with lower urinary tract symptoms: Secondary | ICD-10-CM | POA: Diagnosis not present

## 2018-10-16 DIAGNOSIS — N138 Other obstructive and reflux uropathy: Secondary | ICD-10-CM | POA: Diagnosis not present

## 2018-10-28 DIAGNOSIS — M5136 Other intervertebral disc degeneration, lumbar region: Secondary | ICD-10-CM | POA: Diagnosis not present

## 2018-10-28 DIAGNOSIS — M9901 Segmental and somatic dysfunction of cervical region: Secondary | ICD-10-CM | POA: Diagnosis not present

## 2018-10-28 DIAGNOSIS — M9903 Segmental and somatic dysfunction of lumbar region: Secondary | ICD-10-CM | POA: Diagnosis not present

## 2018-10-28 DIAGNOSIS — M9902 Segmental and somatic dysfunction of thoracic region: Secondary | ICD-10-CM | POA: Diagnosis not present

## 2018-10-28 DIAGNOSIS — M5134 Other intervertebral disc degeneration, thoracic region: Secondary | ICD-10-CM | POA: Diagnosis not present

## 2018-10-28 DIAGNOSIS — M5031 Other cervical disc degeneration,  high cervical region: Secondary | ICD-10-CM | POA: Diagnosis not present

## 2018-11-11 DIAGNOSIS — M5136 Other intervertebral disc degeneration, lumbar region: Secondary | ICD-10-CM | POA: Diagnosis not present

## 2018-11-11 DIAGNOSIS — M9901 Segmental and somatic dysfunction of cervical region: Secondary | ICD-10-CM | POA: Diagnosis not present

## 2018-11-11 DIAGNOSIS — M5134 Other intervertebral disc degeneration, thoracic region: Secondary | ICD-10-CM | POA: Diagnosis not present

## 2018-11-11 DIAGNOSIS — M5031 Other cervical disc degeneration,  high cervical region: Secondary | ICD-10-CM | POA: Diagnosis not present

## 2018-11-11 DIAGNOSIS — M9903 Segmental and somatic dysfunction of lumbar region: Secondary | ICD-10-CM | POA: Diagnosis not present

## 2018-11-11 DIAGNOSIS — M9902 Segmental and somatic dysfunction of thoracic region: Secondary | ICD-10-CM | POA: Diagnosis not present

## 2018-12-02 DIAGNOSIS — M5031 Other cervical disc degeneration,  high cervical region: Secondary | ICD-10-CM | POA: Diagnosis not present

## 2018-12-02 DIAGNOSIS — M5136 Other intervertebral disc degeneration, lumbar region: Secondary | ICD-10-CM | POA: Diagnosis not present

## 2018-12-02 DIAGNOSIS — M9901 Segmental and somatic dysfunction of cervical region: Secondary | ICD-10-CM | POA: Diagnosis not present

## 2018-12-02 DIAGNOSIS — M9903 Segmental and somatic dysfunction of lumbar region: Secondary | ICD-10-CM | POA: Diagnosis not present

## 2018-12-02 DIAGNOSIS — M5134 Other intervertebral disc degeneration, thoracic region: Secondary | ICD-10-CM | POA: Diagnosis not present

## 2018-12-02 DIAGNOSIS — M9902 Segmental and somatic dysfunction of thoracic region: Secondary | ICD-10-CM | POA: Diagnosis not present

## 2018-12-22 ENCOUNTER — Other Ambulatory Visit: Payer: Self-pay | Admitting: Adult Health

## 2018-12-22 DIAGNOSIS — I10 Essential (primary) hypertension: Secondary | ICD-10-CM

## 2018-12-24 NOTE — Telephone Encounter (Signed)
SENT TO THE PHARMACY BY E-SCRIBE. 

## 2018-12-30 ENCOUNTER — Telehealth (INDEPENDENT_AMBULATORY_CARE_PROVIDER_SITE_OTHER): Payer: Medicare Other | Admitting: Adult Health

## 2018-12-30 ENCOUNTER — Other Ambulatory Visit: Payer: Self-pay

## 2018-12-30 DIAGNOSIS — T148XXA Other injury of unspecified body region, initial encounter: Secondary | ICD-10-CM

## 2018-12-30 NOTE — Progress Notes (Signed)
Virtual Visit via Video Note  I connected with Todd Larson on 12/30/18 at  3:00 PM EST by a video enabled telemedicine application and verified that I am speaking with the correct person using two identifiers.  Location patient: home Location provider:work or home office Persons participating in the virtual visit: patient, provider  I discussed the limitations of evaluation and management by telemedicine and the availability of in person appointments. The patient expressed understanding and agreed to proceed.   HPI: 78 year old male who is being evaluated today for an acute on chronic issue of upper mid back pain.  In the past he has been seen by a chiropractor which significantly helps with the discomfort.  During Covid it was hard to get an appointment with his chiropractor and he recently just started going back on routine basis.  He was last seen early last week and a few days after his chiropractor visit he started experiencing muscle spasms and a "cramping pain" in his mid back.  Spasms are worse when sitting down suddenly and when going to lay down in bed at night.  Bending over in the morning also produces muscle spasms.  Denies any issues with bowel or bladder, no radiating pain.  Pain has slowly been improving.  He is taking Motrin.  He does have leftover Flexeril from previous visits due to back pain.  He has not been taking this yet   ROS: See pertinent positives and negatives per HPI.  Past Medical History:  Diagnosis Date  . Arthritis    cervical spine - T7-8 & lumbar area - bone spurs up & down the back   . Bulging discs     thoracic spine  . Diverticulosis of colon   . GERD (gastroesophageal reflux disease)    related to intestinal system, - has started Align  4-5 days ago-   . Hyperlipidemia   . Hypertension   . Inguinal hernia   . OSA (obstructive sleep apnea)    saw Dr. Annamaria Boots- 02/2015- pt. remarks that as a result BP med. was changed , pt. has a sleep number bed & he  reports the score averages in the 80% range   . PSA elevation    History of   . Recurrent sinus infections    related to fracture nose x2, also reports that he has a deviated septum    Past Surgical History:  Procedure Laterality Date  . COLONOSCOPY  2008   3 internal hemorrhoids, diverticulosis;Dr.Jacobs  . INSERTION OF MESH N/A 11/13/2016   Procedure: INSERTION OF MESH;  Surgeon: Ralene Ok, MD;  Location: [facility];  Service: General;  Laterality: N/A;  . LAPAROSCOPIC INGUINAL HERNIA WITH UMBILICAL HERNIA Left [id number]   Procedure: LAPAROSCOPIC LEFT INGUINAL HERNIA  AND UMBILICAL HERNIA REPAIRS WITH MESH;  Surgeon: Ralene Ok, MD;  Location: [facility];  Service: General;  Laterality: Left;  . PROSTATE BIOPSY     x 2, once abnormal, once normal  . TONSILLECTOMY      Family History  Problem Relation Age of Onset  . Lung cancer Maternal Grandfather        enviromental   . Heart attack Paternal Uncle   . Prostate cancer Brother      Current Outpatient Medications:  .  cyclobenzaprine (FLEXERIL) 5 MG tablet, Take 1 tablet (5 mg total) by mouth 3 (three) times daily as needed for muscle spasms., Disp: 30 tablet, Rfl: 6 .  finasteride (PROSCAR) 5 MG tablet, Take 5 mg by mouth daily.,  Disp: , Rfl:  .  fluticasone (FLONASE) 50 MCG/ACT nasal spray, USE 1 SPRAY(S) IN EACH NOSTRIL TWICE DAILY AS NEEDED FOR RHINITIS, Disp: 48 g, Rfl: 0 .  gabapentin (NEURONTIN) 100 MG capsule, Take 2 capsules (200 mg total) by mouth at bedtime. (Patient taking differently: Take 200 mg by mouth at bedtime as needed. ), Disp: 60 capsule, Rfl: 1 .  hydrochlorothiazide (HYDRODIURIL) 25 MG tablet, Take 1 tablet by mouth once daily, Disp: 90 tablet, Rfl: 3 .  losartan (COZAAR) 50 MG tablet, Take 1 tablet by mouth once daily, Disp: 90 tablet, Rfl: 2 .  Polyvinyl Alcohol-Povidone (REFRESH OP), Place 1 drop as needed into both eyes (for dry eyes)., Disp: , Rfl:   EXAM:  VITALS per patient if  applicable:  GENERAL: alert, oriented, appears well and in no acute distress  HEENT: atraumatic, conjunttiva clear, no obvious abnormalities on inspection of external nose and ears  NECK: normal movements of the head and neck  LUNGS: on inspection no signs of respiratory distress, breathing rate appears normal, no obvious gross SOB, gasping or wheezing  CV: no obvious cyanosis  MS: moves all visible extremities without noticeable abnormality  PSYCH/NEURO: pleasant and cooperative, no obvious depression or anxiety, speech and thought processing grossly intact  ASSESSMENT AND PLAN:  Discussed the following assessment and plan:  1. Muscle strain -Can continue with Motrin, use of sports cream, and or heating pad while resting.  It is okay for him to take Flexeril nightly for the next 2 to 3 days.  If no improvement then follow-up in the office    I discussed the assessment and treatment plan with the patient. The patient was provided an opportunity to ask questions and all were answered. The patient agreed with the plan and demonstrated an understanding of the instructions.   The patient was advised to call back or seek an in-person evaluation if the symptoms worsen or if the condition fails to improve as anticipated.   Dorothyann Peng, NP

## 2019-01-06 ENCOUNTER — Other Ambulatory Visit: Payer: Self-pay | Admitting: Adult Health

## 2019-01-06 ENCOUNTER — Encounter: Payer: Self-pay | Admitting: Adult Health

## 2019-01-06 DIAGNOSIS — T148XXA Other injury of unspecified body region, initial encounter: Secondary | ICD-10-CM

## 2019-01-06 MED ORDER — METHYLPREDNISOLONE 4 MG PO TBPK: ORAL_TABLET | ORAL | 0 refills | Status: DC

## 2019-01-07 ENCOUNTER — Ambulatory Visit (INDEPENDENT_AMBULATORY_CARE_PROVIDER_SITE_OTHER): Payer: Medicare Other

## 2019-01-07 ENCOUNTER — Other Ambulatory Visit: Payer: Self-pay

## 2019-01-07 ENCOUNTER — Other Ambulatory Visit: Payer: Medicare Other

## 2019-01-07 DIAGNOSIS — T148XXA Other injury of unspecified body region, initial encounter: Secondary | ICD-10-CM

## 2019-01-12 ENCOUNTER — Encounter: Payer: Self-pay | Admitting: Adult Health

## 2019-01-12 ENCOUNTER — Other Ambulatory Visit: Payer: Self-pay | Admitting: Adult Health

## 2019-01-12 DIAGNOSIS — M5441 Lumbago with sciatica, right side: Secondary | ICD-10-CM

## 2019-01-12 DIAGNOSIS — M5442 Lumbago with sciatica, left side: Secondary | ICD-10-CM

## 2019-01-12 NOTE — Telephone Encounter (Signed)
Todd Larson,  Just wanted you to be aware of his "symptoms".. okay for him to still be seen?

## 2019-01-13 ENCOUNTER — Other Ambulatory Visit: Payer: Self-pay

## 2019-01-13 ENCOUNTER — Ambulatory Visit (INDEPENDENT_AMBULATORY_CARE_PROVIDER_SITE_OTHER): Payer: Medicare Other | Admitting: Family Medicine

## 2019-01-13 ENCOUNTER — Encounter: Payer: Self-pay | Admitting: Family Medicine

## 2019-01-13 VITALS — BP 124/74 | HR 105 | Ht 68.25 in | Wt 233.8 lb

## 2019-01-13 DIAGNOSIS — M546 Pain in thoracic spine: Secondary | ICD-10-CM

## 2019-01-13 NOTE — Telephone Encounter (Signed)
Does patient want this refilled? Seems as though it wasn't working last time we spoke

## 2019-01-13 NOTE — Progress Notes (Signed)
I, Todd Larson, LAT, ATC, am serving as scribe for Dr. Lynne Larson.  Todd Larson is a 79 y.o. male who presents to [facility] today for mid-back pain and spasms since December 2020.  He reports cramping-type pain and muscle spasms that are worsened w/ sitting, laying down at night to go to bed and forward flexion.  He has seen a chiropractor and saw his PCP on 12/30/18 who recommended heat and Flexeril.  Pt provided an update to his PCP w/ worsening symptoms on 01/06/19 and was prescribed a medrol dose pack.  Additionally a t-spine XR was ordered which the pt had on 01/07/19.  Pt finished his medrol dose pack yesterday and reports no change in his symptoms.  At rest, he reports 1-2/10 pain at his mid-Tspine but pain can increase randomly to a 9-10/10.  He states that just before Christmas, he received a few large, heavier packages and feels that contributed to his current symptoms from trying to unload the boxes/packages.   Relevant historical information: History of hypertension and sleep apnea.  Additionally history of known cervical thoracic and lumbar spine degenerative changes.   ROS:  As above  Exam:  BP 124/74 (BP Location: Left Arm, Patient Position: Sitting, Cuff Size: Large)   Pulse (!) 105   Ht 5' 8.25" (1.734 m)   Wt 233 lb 12.8 oz (106.1 kg)   SpO2 98%   BMI 35.29 kg/m   MSK:  T-spine: Normal-appearing Not particularly tender to midline. Mildly tender palpation thoracic paraspinal musculature. Decreased motion to thoracic spine extension. Upper extremity strength and motion is normal. Normal gait.   Lab and Radiology Results DG Thoracic Spine W/Swimmers  Result Date: 01/07/2019 CLINICAL DATA:  Back pain. EXAM: THORACIC SPINE - 3 VIEWS COMPARISON:  None. FINDINGS: There is no evidence of thoracic spine fracture. Alignment is normal. There is moderate severity multilevel endplate sclerosis and lateral osteophyte formation. Mild-to-moderate  severity multilevel intervertebral disc space narrowing is seen which is more prominent within the lower thoracic spine. IMPRESSION: 1. No acute findings in the thoracic spine. 2. Multilevel degenerative disc disease in the thoracic spine. Electronically Signed   By: Virgina Norfolk M.D.   On: 01/07/2019 19:07    I, Todd Larson, personally (independently) visualized and performed the interpretation of the images attached in this note.     Assessment and Plan: 79 y.o. male with mid thoracic back pain.  Patient has significant degenerative changes seen on x-ray T-spine dated January 07, 2019.  When he does have pain it tends to be somewhat midline.  However I believe the majority the pain is due to paraspinal muscular dysfunction.  Plan to proceed with physical therapy.  If not improving patient will let me know and we will check back in 6 weeks or sooner.  If not improving next step would likely be advanced imaging to further characterize cause of pain.   PDMP not reviewed this encounter. Orders Placed This Encounter  Procedures  . Ambulatory referral to Physical Therapy    Referral Priority:   Routine    Referral Type:   Physical Medicine    Referral Reason:   Specialty Services Required    Requested Specialty:   Physical Therapy   No orders of the defined types were placed in this encounter.    Discussed warning signs or symptoms. Please see discharge instructions. Patient expresses understanding.  The above documentation has been reviewed and is accurate and complete Todd Larson

## 2019-01-13 NOTE — Telephone Encounter (Signed)
Okay for refill?   Pt was seen [id number] for muscle strain etc.

## 2019-01-13 NOTE — Patient Instructions (Signed)
Thank you for coming in today. Attend PT. Ok to use over the counter medicine as needed.  Use flexeril as needed. If it does not help much then dont take it.  Tylenol in general is safer than Ibuprofen.   Recheck with me in 6 weeks.  Return sooner if needed.  If not doing well let me know. We can change before the follow up.

## 2019-01-16 NOTE — Telephone Encounter (Signed)
Spoke to pt and he stated that it wasn't helping. Pt declined and stated he is doing PT.

## 2019-01-20 ENCOUNTER — Encounter: Payer: Self-pay | Admitting: Adult Health

## 2019-01-20 DIAGNOSIS — M5031 Other cervical disc degeneration,  high cervical region: Secondary | ICD-10-CM | POA: Diagnosis not present

## 2019-01-20 DIAGNOSIS — M9902 Segmental and somatic dysfunction of thoracic region: Secondary | ICD-10-CM | POA: Diagnosis not present

## 2019-01-20 DIAGNOSIS — M5134 Other intervertebral disc degeneration, thoracic region: Secondary | ICD-10-CM | POA: Diagnosis not present

## 2019-01-20 DIAGNOSIS — M5136 Other intervertebral disc degeneration, lumbar region: Secondary | ICD-10-CM | POA: Diagnosis not present

## 2019-01-20 DIAGNOSIS — M9903 Segmental and somatic dysfunction of lumbar region: Secondary | ICD-10-CM | POA: Diagnosis not present

## 2019-01-20 DIAGNOSIS — M9901 Segmental and somatic dysfunction of cervical region: Secondary | ICD-10-CM | POA: Diagnosis not present

## 2019-01-20 NOTE — Telephone Encounter (Signed)
FYI

## 2019-01-22 ENCOUNTER — Encounter: Payer: Self-pay | Admitting: Family Medicine

## 2019-01-27 ENCOUNTER — Ambulatory Visit: Payer: Medicare Other | Attending: Family Medicine | Admitting: Physical Therapy

## 2019-01-27 ENCOUNTER — Encounter: Payer: Self-pay | Admitting: Physical Therapy

## 2019-01-27 ENCOUNTER — Other Ambulatory Visit: Payer: Self-pay

## 2019-01-27 DIAGNOSIS — M62830 Muscle spasm of back: Secondary | ICD-10-CM | POA: Insufficient documentation

## 2019-01-27 DIAGNOSIS — M546 Pain in thoracic spine: Secondary | ICD-10-CM | POA: Diagnosis not present

## 2019-01-27 NOTE — Patient Instructions (Addendum)
Trigger Point Dry Needling  . What is Trigger Point Dry Needling (DN)? o DN is a physical therapy technique used to treat muscle pain and dysfunction. Specifically, DN helps deactivate muscle trigger points (muscle knots).  o A thin filiform needle is used to penetrate the skin and stimulate the underlying trigger point. The goal is for a local twitch response (LTR) to occur and for the trigger point to relax. No medication of any kind is injected during the procedure.   . What Does Trigger Point Dry Needling Feel Like?  o The procedure feels different for each individual patient. Some patients report that they do not actually feel the needle enter the skin and overall the process is not painful. Very mild bleeding may occur. However, many patients feel a deep cramping in the muscle in which the needle was inserted. This is the local twitch response.   Marland Kitchen How Will I feel after the treatment? o Soreness is normal, and the onset of soreness may not occur for a few hours. Typically this soreness does not last longer than two days.  o Bruising is uncommon, however; ice can be used to decrease any possible bruising.  o In rare cases feeling tired or nauseous after the treatment is normal. In addition, your symptoms may get worse before they get better, this period will typically not last longer than 24 hours.   . What Can I do After My Treatment? o Increase your hydration by drinking more water for the next 24 hours. o You may place ice or heat on the areas treated that have become sore, however, do not use heat on inflamed or bruised areas. Heat often brings more relief post needling. o You can continue your regular activities, but vigorous activity is not recommended initially after the treatment for 24 hours. o DN is best combined with other physical therapy such as strengthening, stretching, and other therapies.  Access Code: [facility]  URL: [email or web address]  Date: 01/27/2019   Prepared by: Lum Babe   Exercises Supine Lower Trunk Rotation - 10 reps - 1 sets - 10 hold - 2x daily - 7x weekly Seated Sidebending - 10 reps - 1 sets - 5 hold - 2x daily - 7x weekly Plank with Thoracic Rotation on Counter - 10 reps - 1 sets - 2 hold - 2x daily - 7x weekly Seated Upper Thoracic Stretch - 10 reps - 1 sets - 5 hold - 2x daily                            - 7x weekly

## 2019-01-27 NOTE — Therapy (Signed)
[place], [place], [address] Phone: [phone]   Fax:  [phone]  Physical Therapy Evaluation  Patient Details  Name: Todd Larson MRN: [id number] Date of Birth: 1940/08/07 Referring Provider (PT): Lynne Leader   Encounter Date: 01/27/2019  PT End of Session - 01/27/19 1130    Visit Number  1    Date for PT Re-Evaluation  03/27/19    PT Start Time  1050    PT Stop Time  1150    PT Time Calculation (min)  60 min    Activity Tolerance  Patient tolerated treatment well    Behavior During Therapy  [facility] for tasks assessed/performed       Past Medical History:  Diagnosis Date  . Arthritis    cervical spine - T7-8 & lumbar area - bone spurs up & down the back   . Bulging discs     thoracic spine  . Diverticulosis of colon   . GERD (gastroesophageal reflux disease)    related to intestinal system, - has started Align  4-5 days ago-   . Hyperlipidemia   . Hypertension   . Inguinal hernia   . OSA (obstructive sleep apnea)    saw Dr. Annamaria Boots- 02/2015- pt. remarks that as a result BP med. was changed , pt. has a sleep number bed & he reports the score averages in the 80% range   . PSA elevation    History of   . Recurrent sinus infections    related to fracture nose x2, also reports that he has a deviated septum    Past Surgical History:  Procedure Laterality Date  . COLONOSCOPY  2008   3 internal hemorrhoids, diverticulosis;Dr.Jacobs  . INSERTION OF MESH N/A 11/13/2016   Procedure: INSERTION OF MESH;  Surgeon: Ralene Ok, MD;  Location: [place];  Service: General;  Laterality: N/A;  . LAPAROSCOPIC INGUINAL HERNIA WITH UMBILICAL HERNIA Left [id number]   Procedure: LAPAROSCOPIC LEFT INGUINAL HERNIA  AND UMBILICAL HERNIA REPAIRS WITH MESH;  Surgeon: Ralene Ok, MD;  Location: [place];  Service: General;  Laterality: Left;  . PROSTATE BIOPSY     x 2, once abnormal, once normal  .  TONSILLECTOMY      There were no vitals filed for this visit.   Subjective Assessment - 01/27/19 1058    Subjective  Patient reports that in December he lifted a 50# box, also had to get a vacuum out of a box and has had sharp pain in the thoracic area, reports difficulty getting in and out of the bed.  X-rays show DDD and arthritis throughout the thoracic area    Pertinent History  GERD, HTN, abdominal hernia with surgical mesh    Patient Stated Goals  have less pain, be able to move easier    Currently in Pain?  Yes    Pain Score  1     Pain Location  Back    Pain Orientation  Mid    Pain Descriptors / Indicators  Sharp;Shooting    Pain Type  Acute pain    Pain Radiating Towards  denies    Pain Onset  More than a month ago    Pain Frequency  Intermittent    Aggravating Factors   getting in out of bed, coughing, some twisting or bending pain up to 9/10 for a few seconds    Pain Relieving Factors  rest pain can be 0/10  Effect of Pain on Daily Activities  difficulty getting in and out of bed.  Difficulty lifting         [facility] PT Assessment - 01/27/19 0001      Assessment   Medical Diagnosis  thoracic pain    Referring Provider (PT)  Lynne Leader    Onset Date/Surgical Date  01/13/19    Prior Therapy  for neck and back years ago      Precautions   Precautions  None      Balance Screen   Has the patient fallen in the past 6 months  No    Has the patient had a decrease in activity level because of a fear of falling?   No    Is the patient reluctant to leave their home because of a fear of falling?   No      Home Environment   Additional Comments  does housework      Prior Function   Level of Independence  Independent    Vocation  Retired    Leisure  no exercise      Mining engineer Comments  very forward head, rounded shoulders, large abdomen      ROM / Strength   AROM / PROM / Strength  AROM;Strength      AROM   Overall AROM Comments  cervical  ROM flexion WNL's, extension decreased 100%, rotation 50%, side bending decreased 100%, Lumbar ROM decreased 75%, shoulder ROM WFL's      Strength   Overall Strength Comments  UE and LE 4+/5 without c/o pain      Palpation   Palpation comment  rib mobility is limited to some extent, diastisis recti in the abdomen, very tender with the T10 area being recessed appearing and feeling      Transfers   Comments  has a lot of difficulty with bed mobility, pain with getting in and out of bed                Objective measurements completed on examination: See above findings.      [facility] Adult PT Treatment/Exercise - 01/27/19 0001      Modalities   Modalities  Teacher, English as a foreign language Location  thoracic area    Electrical Stimulation Action  IFC    Electrical Stimulation Parameters  sitting    Electrical Stimulation Goals  Pain               PT Short Term Goals - 01/27/19 1134      PT SHORT TERM GOAL #1   Title  independent with initial HEP    Time  2    Period  Weeks    Status  New        PT Long Term Goals - 01/27/19 1134      PT LONG TERM GOAL #1   Title  understand body mechanics    Time  8    Period  Weeks    Status  New      PT LONG TERM GOAL #2   Title  decrease pain 50% with bed mobility    Time  8    Period  Weeks    Status  New      PT LONG TERM GOAL #3   Title  increase lumbar ROM 25%    Time  8    Period  Weeks    Status  New  PT LONG TERM GOAL #4   Title  independent with advance HEP    Time  8    Period  Weeks    Status  New             Plan - 01/27/19 1131    Clinical Impression Statement  Patient comes in with c/o thoracic pain, has some degerative changes and arthritis, his feels this started about a month ago with lifting some heavy boxes he had ordered, he has a lot of difficulty getting in and out of bed, this causes a sharp shooting pain a 9/10 that lasts about  2-3 seconds.  He is very tender at T10 area with this area recesses.  He has severe limitations in cervical, thoracic and lumbar ROM, very tight    Stability/Clinical Decision Making  Stable/Uncomplicated    Clinical Decision Making  Low    Rehab Potential  Good    PT Frequency  2x / week    PT Duration  8 weeks    PT Treatment/Interventions  ADLs/Self Care Home Management;Cryotherapy;Electrical Stimulation;Moist Heat;Traction;Ultrasound;Therapeutic activities;Therapeutic exercise;Manual techniques;Patient/family education;Passive range of motion;Dry needling    PT Next Visit Plan  gym exercises, work on movements of the thoracic spine    Consulted and Agree with Plan of Care  Patient       Patient will benefit from skilled therapeutic intervention in order to improve the following deficits and impairments:  Pain, Improper body mechanics, Postural dysfunction, Increased muscle spasms, Decreased range of motion, Decreased strength, Impaired flexibility  Visit Diagnosis: Pain in thoracic spine - Plan: PT plan of care cert/re-cert  Muscle spasm of back - Plan: PT plan of care cert/re-cert     Problem List Patient Active Problem List   Diagnosis Date Noted  . Right knee meniscal tear 06/01/2015  . Obstructive sleep apnea 02/07/2015  . Thoracic spine pain 03/29/2014  . Tenosynovitis of thumb 03/29/2014  . Cervical radiculopathy due to degenerative joint disease of spine 10/10/2013  . Bursitis of left shoulder 09/23/2013  . Chronic low back pain 12/15/2012  . Chronic cervical pain 06/09/2012  . Hyperlipidemia 07/06/2010  . Essential hypertension 11/03/2009  . INGUINAL HERNIA, LEFT 11/03/2009  . Nonallergic rhinitis 08/24/2009  . VENTRAL HERNIA 05/20/2009  . DIVERTICULOSIS, COLON 05/20/2009  . NEVUS, ATYPICAL 10/26/2008  . GERD 10/01/2006    Sumner Boast., PT 01/27/2019, 11:45 AM  [place]  [place], [place], [address] Phone: [phone]   Fax:  [phone]  Name: Todd Larson MRN: [id number] Date of Birth: June 09, 1940

## 2019-02-02 ENCOUNTER — Encounter: Payer: Self-pay | Admitting: Physical Therapy

## 2019-02-02 ENCOUNTER — Ambulatory Visit: Payer: Medicare Other | Attending: Family Medicine | Admitting: Physical Therapy

## 2019-02-02 ENCOUNTER — Other Ambulatory Visit: Payer: Self-pay

## 2019-02-02 DIAGNOSIS — M62830 Muscle spasm of back: Secondary | ICD-10-CM | POA: Diagnosis not present

## 2019-02-02 DIAGNOSIS — M546 Pain in thoracic spine: Secondary | ICD-10-CM | POA: Diagnosis not present

## 2019-02-02 NOTE — Therapy (Signed)
[place], [place], [address] Phone: [phone]   Fax:  [phone]  Physical Therapy Treatment  Patient Details  Name: Todd Larson MRN: [id number] Date of Birth: [birth year] Referring Provider (PT): Lynne Leader   Encounter Date: 02/02/2019  PT End of Session - 02/02/19 1504    Visit Number  2    Date for PT Re-Evaluation  03/27/19    PT Start Time  1430    PT Stop Time  1520    PT Time Calculation (min)  50 min    Activity Tolerance  Patient tolerated treatment well    Behavior During Therapy  [facility] for tasks assessed/performed       Past Medical History:  Diagnosis Date  . Arthritis    cervical spine - T7-8 & lumbar area - bone spurs up & down the back   . Bulging discs     thoracic spine  . Diverticulosis of colon   . GERD (gastroesophageal reflux disease)    related to intestinal system, - has started Align  4-5 days ago-   . Hyperlipidemia   . Hypertension   . Inguinal hernia   . OSA (obstructive sleep apnea)    saw Dr. Annamaria Boots- 02/2015- pt. remarks that as a result BP med. was changed , pt. has a sleep number bed & he reports the score averages in the 80% range   . PSA elevation    History of   . Recurrent sinus infections    related to fracture nose x2, also reports that he has a deviated septum    Past Surgical History:  Procedure Laterality Date  . COLONOSCOPY  2008   3 internal hemorrhoids, diverticulosis;Dr.Jacobs  . INSERTION OF MESH N/A 11/13/2016   Procedure: INSERTION OF MESH;  Surgeon: Ralene Ok, MD;  Location: [place];  Service: General;  Laterality: N/A;  . LAPAROSCOPIC INGUINAL HERNIA WITH UMBILICAL HERNIA Left [id number]   Procedure: LAPAROSCOPIC LEFT INGUINAL HERNIA  AND UMBILICAL HERNIA REPAIRS WITH MESH;  Surgeon: Ralene Ok, MD;  Location: [place];  Service: General;  Laterality: Left;  . PROSTATE BIOPSY     x 2, once abnormal, once normal  .  TONSILLECTOMY      There were no vitals filed for this visit.  Subjective Assessment - 02/02/19 1434    Subjective  Pt reports that he mat have tweaked his back doing one of the exercises extending back against chair. He reports not having a chair low enough. No pain just stiff    Currently in Pain?  No/denies    Pain Location  Back    Pain Descriptors / Indicators  --   stiff                      [facility] Adult PT Treatment/Exercise - 02/02/19 0001      Exercises   Exercises  Lumbar      Lumbar Exercises: Aerobic   Nustep  L4 x 6 min       Lumbar Exercises: Machines for Strengthening   Cybex Lumbar Extension  black band 2x10     Other Lumbar Machine Exercise  Row & lats 25lb 2x10       Lumbar Exercises: Standing   Row  Strengthening;Theraband;20 reps    Theraband Level (Row)  Level 3 (Green)    Shoulder Extension  Theraband;20 reps;Both    Theraband Level (Shoulder Extension)  Level 3 (Green)  Lumbar Exercises: Seated   Sit to Stand  Limitations   from mat table    Other Seated Lumbar Exercises  Mini situps to BOSU 2x10    Some pulling on R side of mis back      Modalities   Modalities  Electrical Stimulation      Electrical Stimulation   Electrical Stimulation Location  thoracic area    Electrical Stimulation Action  IFC    Electrical Stimulation Parameters  sitting    Electrical Stimulation Goals  Pain               PT Short Term Goals - 01/27/19 1134      PT SHORT TERM GOAL #1   Title  independent with initial HEP    Time  2    Period  Weeks    Status  New        PT Long Term Goals - 01/27/19 1134      PT LONG TERM GOAL #1   Title  understand body mechanics    Time  8    Period  Weeks    Status  New      PT LONG TERM GOAL #2   Title  decrease pain 50% with bed mobility    Time  8    Period  Weeks    Status  New      PT LONG TERM GOAL #3   Title  increase lumbar ROM 25%    Time  8    Period  Weeks    Status  New       PT LONG TERM GOAL #4   Title  independent with advance HEP    Time  8    Period  Weeks    Status  New            Plan - 02/02/19 1505    Clinical Impression Statement  Pt tolerated an initial progression to TE well. He did report a pull on the R side of his back with mini sit ups. Pt has forward head and rounded shoulders. Tactile cues needed for posture with rows. He report sit to sands are difficult for him in the morning, but he did them well in today's session. Pain reported in T10 area were there is a recessed area.    Stability/Clinical Decision Making  Stable/Uncomplicated    Rehab Potential  Good    PT Frequency  2x / week    PT Duration  8 weeks    PT Treatment/Interventions  ADLs/Self Care Home Management;Cryotherapy;Electrical Stimulation;Moist Heat;Traction;Ultrasound;Therapeutic activities;Therapeutic exercise;Manual techniques;Patient/family education;Passive range of motion;Dry needling    PT Next Visit Plan  gym exercises, work on movements of the thoracic spine       Patient will benefit from skilled therapeutic intervention in order to improve the following deficits and impairments:  Pain, Improper body mechanics, Postural dysfunction, Increased muscle spasms, Decreased range of motion, Decreased strength, Impaired flexibility  Visit Diagnosis: Pain in thoracic spine  Muscle spasm of back     Problem List Patient Active Problem List   Diagnosis Date Noted  . Right knee meniscal tear 06/01/2015  . Obstructive sleep apnea 02/07/2015  . Thoracic spine pain 03/29/2014  . Tenosynovitis of thumb 03/29/2014  . Cervical radiculopathy due to degenerative joint disease of spine 10/10/2013  . Bursitis of left shoulder 09/23/2013  . Chronic low back pain 12/15/2012  . Chronic cervical pain 06/09/2012  . Hyperlipidemia 07/06/2010  . Essential hypertension 11/03/2009  .  INGUINAL HERNIA, LEFT 11/03/2009  . Nonallergic rhinitis 08/24/2009  . VENTRAL HERNIA  05/20/2009  . DIVERTICULOSIS, COLON 05/20/2009  . NEVUS, ATYPICAL 10/26/2008  . GERD 10/01/2006    Scot Jun, PTA 02/02/2019, 3:12 PM  [place], [place], [address] Phone: [phone]   Fax:  [phone]  Name: RASHEED KARGES MRN: [id number] Date of Birth: [birth year]

## 2019-02-05 ENCOUNTER — Other Ambulatory Visit: Payer: Self-pay

## 2019-02-05 ENCOUNTER — Ambulatory Visit: Payer: Medicare Other | Admitting: Physical Therapy

## 2019-02-05 ENCOUNTER — Encounter: Payer: Self-pay | Admitting: Physical Therapy

## 2019-02-05 DIAGNOSIS — M62830 Muscle spasm of back: Secondary | ICD-10-CM | POA: Diagnosis not present

## 2019-02-05 DIAGNOSIS — M546 Pain in thoracic spine: Secondary | ICD-10-CM

## 2019-02-05 NOTE — Therapy (Signed)
[place], [place], [address] Phone: [phone]   Fax:  [phone]  Physical Therapy Treatment  Patient Details  Name: Todd Larson MRN: [id number] Date of Birth: [birth year] Referring Provider (PT): Lynne Leader   Encounter Date: 02/05/2019  PT End of Session - 02/05/19 1510    Visit Number  4    Date for PT Re-Evaluation  03/27/19    PT Start Time  5053    PT Stop Time  1523    PT Time Calculation (min)  54 min    Activity Tolerance  Patient tolerated treatment well    Behavior During Therapy  [facility] for tasks assessed/performed       Past Medical History:  Diagnosis Date  . Arthritis    cervical spine - T7-8 & lumbar area - bone spurs up & down the back   . Bulging discs     thoracic spine  . Diverticulosis of colon   . GERD (gastroesophageal reflux disease)    related to intestinal system, - has started Align  4-5 days ago-   . Hyperlipidemia   . Hypertension   . Inguinal hernia   . OSA (obstructive sleep apnea)    saw Dr. Annamaria Boots- 02/2015- pt. remarks that as a result BP med. was changed , pt. has a sleep number bed & he reports the score averages in the 80% range   . PSA elevation    History of   . Recurrent sinus infections    related to fracture nose x2, also reports that he has a deviated septum    Past Surgical History:  Procedure Laterality Date  . COLONOSCOPY  2008   3 internal hemorrhoids, diverticulosis;Dr.Jacobs  . INSERTION OF MESH N/A 11/13/2016   Procedure: INSERTION OF MESH;  Surgeon: Ralene Ok, MD;  Location: [place];  Service: General;  Laterality: N/A;  . LAPAROSCOPIC INGUINAL HERNIA WITH UMBILICAL HERNIA Left [id number]/3419   Procedure: LAPAROSCOPIC LEFT INGUINAL HERNIA  AND UMBILICAL HERNIA REPAIRS WITH MESH;  Surgeon: Ralene Ok, MD;  Location: [place];  Service: General;  Laterality: Left;  . PROSTATE BIOPSY     x 2, once abnormal, once normal  .  TONSILLECTOMY      There were no vitals filed for this visit.  Subjective Assessment - 02/05/19 1432    Subjective  Doing well, almost no pain. Was laying in bed Monday night and heard a pop, and he noticed the dip in his spine was gone    Pertinent History  GERD, HTN, abdominal hernia with surgical mesh    Currently in Pain?  No/denies                       [facility] Adult PT Treatment/Exercise - 02/05/19 0001      Lumbar Exercises: Aerobic   UBE (Upper Arm Bike)  L4 x 3 min each     Nustep  L5 x 6 min       Lumbar Exercises: Machines for Strengthening   Cybex Lumbar Extension  black band 2x15     Cybex Knee Extension  5lb 2x10    Cybex Knee Flexion  25lb x15, x 10      Other Lumbar Machine Exercise  Row & lats 25lb 2x15       Lumbar Exercises: Standing   Row  Strengthening;Theraband;20 reps    Theraband Level (Row)  Level 3 (Green)    Other  Standing Lumbar Exercises  Shoulder ER red 2x10      Lumbar Exercises: Seated   Sit to Stand  10 reps      Electrical Stimulation   Electrical Stimulation Location  thoracic area    Electrical Stimulation Action  IFC    Electrical Stimulation Parameters  sitting    Electrical Stimulation Goals  Pain               PT Short Term Goals - 02/05/19 1511      PT SHORT TERM GOAL #1   Title  independent with initial HEP    Status  Achieved        PT Long Term Goals - 02/05/19 1511      PT LONG TERM GOAL #1   Title  understand body mechanics    Status  Partially Met      PT LONG TERM GOAL #2   Title  decrease pain 50% with bed mobility    Status  On-going      PT LONG TERM GOAL #3   Title  increase lumbar ROM 25%    Status  On-going            Plan - 02/05/19 1512    Clinical Impression Statement  Pt tolerated today's treatment well. Pt reports a pop in his back Monday night then he noticed the dip in his spine was gone. Progressed to more machine level interventions. He did report some LE fatigue  more so with curls. Increased reps tolerated with rows and lats. No reports of increase pain.    Stability/Clinical Decision Making  Stable/Uncomplicated    Rehab Potential  Good    PT Frequency  2x / week    PT Duration  8 weeks    PT Treatment/Interventions  ADLs/Self Care Home Management;Cryotherapy;Electrical Stimulation;Moist Heat;Traction;Ultrasound;Therapeutic activities;Therapeutic exercise;Manual techniques;Patient/family education;Passive range of motion;Dry needling    PT Next Visit Plan  gym exercises, work on movements of the thoracic spine       Patient will benefit from skilled therapeutic intervention in order to improve the following deficits and impairments:  Pain, Improper body mechanics, Postural dysfunction, Increased muscle spasms, Decreased range of motion, Decreased strength, Impaired flexibility  Visit Diagnosis: Pain in thoracic spine  Muscle spasm of back     Problem List Patient Active Problem List   Diagnosis Date Noted  . Right knee meniscal tear 06/01/2015  . Obstructive sleep apnea 02/07/2015  . Thoracic spine pain 03/29/2014  . Tenosynovitis of thumb 03/29/2014  . Cervical radiculopathy due to degenerative joint disease of spine 10/10/2013  . Bursitis of left shoulder 09/23/2013  . Chronic low back pain 12/15/2012  . Chronic cervical pain 06/09/2012  . Hyperlipidemia 07/06/2010  . Essential hypertension 11/03/2009  . INGUINAL HERNIA, LEFT 11/03/2009  . Nonallergic rhinitis 08/24/2009  . VENTRAL HERNIA 05/20/2009  . DIVERTICULOSIS, COLON 05/20/2009  . NEVUS, ATYPICAL 10/26/2008  . GERD 10/01/2006    Scot Jun, PTA 02/05/2019, 3:15 PM  [place], [place], [address] Phone: [phone]   Fax:  [phone]  Name: Todd Larson MRN: [id number] Date of Birth: 1940-06-24

## 2019-02-09 ENCOUNTER — Encounter: Payer: Self-pay | Admitting: Physical Therapy

## 2019-02-09 ENCOUNTER — Other Ambulatory Visit: Payer: Self-pay

## 2019-02-09 ENCOUNTER — Ambulatory Visit: Payer: Medicare Other | Admitting: Physical Therapy

## 2019-02-09 DIAGNOSIS — M62830 Muscle spasm of back: Secondary | ICD-10-CM

## 2019-02-09 DIAGNOSIS — M546 Pain in thoracic spine: Secondary | ICD-10-CM

## 2019-02-09 NOTE — Therapy (Signed)
[place] [address] [place], [place], [address] Phone: [phone]   Fax:  [phone]  Physical Therapy Treatment  Patient Details  Name: Todd Larson MRN: [id number] Date of Birth: 1940-12-15 Referring Provider (PT): Lynne Leader   Encounter Date: 02/09/2019  PT End of Session - 02/09/19 1512    Visit Number  5    Date for PT Re-Evaluation  03/27/19    PT Start Time  1430    PT Stop Time  1524    PT Time Calculation (min)  54 min    Activity Tolerance  Patient tolerated treatment well    Behavior During Therapy  [facility] for tasks assessed/performed       Past Medical History:  Diagnosis Date  . Arthritis    cervical spine - T7-8 & lumbar area - bone spurs up & down the back   . Bulging discs     thoracic spine  . Diverticulosis of colon   . GERD (gastroesophageal reflux disease)    related to intestinal system, - has started Align  4-5 days ago-   . Hyperlipidemia   . Hypertension   . Inguinal hernia   . OSA (obstructive sleep apnea)    saw Dr. Annamaria Boots- 02/2015- pt. remarks that as a result BP med. was changed , pt. has a sleep number bed & he reports the score averages in the 80% range   . PSA elevation    History of   . Recurrent sinus infections    related to fracture nose x2, also reports that he has a deviated septum    Past Surgical History:  Procedure Laterality Date  . COLONOSCOPY  2008   3 internal hemorrhoids, diverticulosis;Dr.Jacobs  . INSERTION OF MESH N/A 11/13/2016   Procedure: INSERTION OF MESH;  Surgeon: Ralene Ok, MD;  Location: [place];  Service: General;  Laterality: N/A;  . LAPAROSCOPIC INGUINAL HERNIA WITH UMBILICAL HERNIA Left 78/67/6720   Procedure: LAPAROSCOPIC LEFT INGUINAL HERNIA  AND UMBILICAL HERNIA REPAIRS WITH MESH;  Surgeon: Ralene Ok, MD;  Location: [place];  Service: General;  Laterality: Left;  . PROSTATE BIOPSY     x 2, once abnormal, once normal  .  TONSILLECTOMY      There were no vitals filed for this visit.  Subjective Assessment - 02/09/19 1432    Subjective  Sore back today but does not know why    Pertinent History  GERD, HTN, abdominal hernia with surgical mesh    Currently in Pain?  No/denies                       [facility] Adult PT Treatment/Exercise - 02/09/19 0001      Lumbar Exercises: Aerobic   UBE (Upper Arm Bike)  L4 x 2 min each     Nustep  L5 x 6 min       Lumbar Exercises: Machines for Strengthening   Cybex Knee Extension  5lb 2x10    Cybex Knee Flexion  25lb 2x10      Leg Press  Shoulder Ext 10lb 2x10     Other Lumbar Machine Exercise  Row & lats 25lb 2x10      Lumbar Exercises: Standing   Other Standing Lumbar Exercises  Shoulder ER red 2x10    Other Standing Lumbar Exercises  Overhead back ext yellow ball 2x10       Lumbar Exercises: Seated   Sit to Stand  10 reps   x2 from blue chair     Acupuncturist Location  thoracic area    Electrical Stimulation Action  IFC    Electrical Stimulation Parameters  sitting    Electrical Stimulation Goals  Pain               PT Short Term Goals - 02/05/19 1511      PT SHORT TERM GOAL #1   Title  independent with initial HEP    Status  Achieved        PT Long Term Goals - 02/05/19 1511      PT LONG TERM GOAL #1   Title  understand body mechanics    Status  Partially Met      PT LONG TERM GOAL #2   Title  decrease pain 50% with bed mobility    Status  On-going      PT LONG TERM GOAL #3   Title  increase lumbar ROM 25%    Status  On-going            Plan - 02/09/19 1513    Clinical Impression Statement  Pt enters clinic reporting increase back stiffness. Reports that it could be due to too him laying in bed two hours more last night Postural cue needed with shoulder Ext and external rotation. LE continues to fatigue with curls and extension. Decrease reps with rows and lats.     Stability/Clinical Decision Making  Stable/Uncomplicated    Rehab Potential  Good    PT Frequency  2x / week    PT Treatment/Interventions  ADLs/Self Care Home Management;Cryotherapy;Electrical Stimulation;Moist Heat;Traction;Ultrasound;Therapeutic activities;Therapeutic exercise;Manual techniques;Patient/family education;Passive range of motion;Dry needling    PT Next Visit Plan  gym exercises, work on movements of the thoracic spine       Patient will benefit from skilled therapeutic intervention in order to improve the following deficits and impairments:  Pain, Improper body mechanics, Postural dysfunction, Increased muscle spasms, Decreased range of motion, Decreased strength, Impaired flexibility  Visit Diagnosis: Muscle spasm of back  Pain in thoracic spine     Problem List Patient Active Problem List   Diagnosis Date Noted  . Right knee meniscal tear 06/01/2015  . Obstructive sleep apnea 02/07/2015  . Thoracic spine pain 03/29/2014  . Tenosynovitis of thumb 03/29/2014  . Cervical radiculopathy due to degenerative joint disease of spine 10/10/2013  . Bursitis of left shoulder 09/23/2013  . Chronic low back pain 12/15/2012  . Chronic cervical pain 06/09/2012  . Hyperlipidemia 07/06/2010  . Essential hypertension 11/03/2009  . INGUINAL HERNIA, LEFT 11/03/2009  . Nonallergic rhinitis 08/24/2009  . VENTRAL HERNIA 05/20/2009  . DIVERTICULOSIS, COLON 05/20/2009  . NEVUS, ATYPICAL 10/26/2008  . GERD 10/01/2006    Scot Jun, PTA 02/09/2019, 3:15 PM  [place], [place], [address] Phone: [phone]   Fax:  [phone]  Name: Todd Larson MRN: [id number] Date of Birth: 1940-09-26

## 2019-02-12 ENCOUNTER — Other Ambulatory Visit: Payer: Self-pay

## 2019-02-12 ENCOUNTER — Encounter: Payer: Self-pay | Admitting: Physical Therapy

## 2019-02-12 ENCOUNTER — Ambulatory Visit: Payer: Medicare Other | Admitting: Physical Therapy

## 2019-02-12 DIAGNOSIS — M546 Pain in thoracic spine: Secondary | ICD-10-CM

## 2019-02-12 DIAGNOSIS — M62830 Muscle spasm of back: Secondary | ICD-10-CM | POA: Diagnosis not present

## 2019-02-12 NOTE — Therapy (Signed)
[place], [place], [address] Phone: [phone]   Fax:  [phone]  Physical Therapy Treatment  Patient Details  Name: Todd Larson MRN: [id number] Date of Birth: 11-13-1940 Referring Provider (PT): Lynne Leader   Encounter Date: 02/12/2019  PT End of Session - 02/12/19 0921    Visit Number  6    Date for PT Re-Evaluation  03/27/19    PT Start Time  0840    PT Stop Time  0935    PT Time Calculation (min)  55 min    Activity Tolerance  Patient tolerated treatment well    Behavior During Therapy  [facility] for tasks assessed/performed       Past Medical History:  Diagnosis Date  . Arthritis    cervical spine - T7-8 & lumbar area - bone spurs up & down the back   . Bulging discs     thoracic spine  . Diverticulosis of colon   . GERD (gastroesophageal reflux disease)    related to intestinal system, - has started Align  4-5 days ago-   . Hyperlipidemia   . Hypertension   . Inguinal hernia   . OSA (obstructive sleep apnea)    saw Dr. Annamaria Boots- 02/2015- pt. remarks that as a result BP med. was changed , pt. has a sleep number bed & he reports the score averages in the 80% range   . PSA elevation    History of   . Recurrent sinus infections    related to fracture nose x2, also reports that he has a deviated septum    Past Surgical History:  Procedure Laterality Date  . COLONOSCOPY  2008   3 internal hemorrhoids, diverticulosis;Dr.Jacobs  . INSERTION OF MESH N/A 11/13/2016   Procedure: INSERTION OF MESH;  Surgeon: Ralene Ok, MD;  Location: [place];  Service: General;  Laterality: N/A;  . LAPAROSCOPIC INGUINAL HERNIA WITH UMBILICAL HERNIA Left 09/62/8366   Procedure: LAPAROSCOPIC LEFT INGUINAL HERNIA  AND UMBILICAL HERNIA REPAIRS WITH MESH;  Surgeon: Ralene Ok, MD;  Location: [place];  Service: General;  Laterality: Left;  . PROSTATE BIOPSY     x 2, once abnormal, once normal  .  TONSILLECTOMY      There were no vitals filed for this visit.  Subjective Assessment - 02/12/19 0842    Subjective  Slept better cause last PT session was not as hard. Some shone ss in her R shoulder due to receiving his second Vaccine shot yesterday.    Currently in Pain?  Yes    Pain Score  2     Pain Location  Shoulder    Pain Orientation  Right         [facility] PT Assessment - 02/12/19 0001      AROM   Overall AROM Comments  cervical ROM flexion WNL's, extension decreased 100%, rotation 50%, side bending decreased 50%, Lumbar ROM decreased 50%, shoulder ROM WFL's                   [facility] Adult PT Treatment/Exercise - 02/12/19 0001      Lumbar Exercises: Aerobic   Nustep  L5 x 6 min       Lumbar Exercises: Machines for Strengthening   Cybex Lumbar Extension  black band 2x15     Cybex Knee Extension  10lb 2x10     Cybex Knee Flexion  35lb 2x10      Other Lumbar Machine  Exercise  Row & lats 25lb 2x10      Lumbar Exercises: Standing   Other Standing Lumbar Exercises  Overhead back ext yellow ball 2x10, Trunk rotations yellow ball x 20       Lumbar Exercises: Seated   Sit to Stand  10 reps   x2 holding red ball from mat table      Electrical Stimulation   Electrical Stimulation Location  thoracic area    Electrical Stimulation Action  IFC    Electrical Stimulation Parameters  sitting    Electrical Stimulation Goals  Pain               PT Short Term Goals - 02/05/19 1511      PT SHORT TERM GOAL #1   Title  independent with initial HEP    Status  Achieved        PT Long Term Goals - 02/05/19 1511      PT LONG TERM GOAL #1   Title  understand body mechanics    Status  Partially Met      PT LONG TERM GOAL #2   Title  decrease pain 50% with bed mobility    Status  On-going      PT LONG TERM GOAL #3   Title  increase lumbar ROM 25%    Status  On-going            Plan - 02/12/19 2449    Clinical Impression Statement  Pt reports  decrease energy due to receiving second vaccine shot yesterday. He has progressed increasing his cervical and lumbar ROM. Increase resistance tolerated with seated leg curls and extensions. He was able to tolerated sit to stand from a lower surface.    Stability/Clinical Decision Making  Stable/Uncomplicated    Rehab Potential  Good    PT Frequency  2x / week    PT Duration  8 weeks    PT Treatment/Interventions  ADLs/Self Care Home Management;Cryotherapy;Electrical Stimulation;Moist Heat;Traction;Ultrasound;Therapeutic activities;Therapeutic exercise;Manual techniques;Patient/family education;Passive range of motion;Dry needling    PT Next Visit Plan  gym exercises, work on movements of the thoracic spine       Patient will benefit from skilled therapeutic intervention in order to improve the following deficits and impairments:  Pain, Improper body mechanics, Postural dysfunction, Increased muscle spasms, Decreased range of motion, Decreased strength, Impaired flexibility  Visit Diagnosis: Pain in thoracic spine  Muscle spasm of back     Problem List Patient Active Problem List   Diagnosis Date Noted  . Right knee meniscal tear 06/01/2015  . Obstructive sleep apnea 02/07/2015  . Thoracic spine pain 03/29/2014  . Tenosynovitis of thumb 03/29/2014  . Cervical radiculopathy due to degenerative joint disease of spine 10/10/2013  . Bursitis of left shoulder 09/23/2013  . Chronic low back pain 12/15/2012  . Chronic cervical pain 06/09/2012  . Hyperlipidemia 07/06/2010  . Essential hypertension 11/03/2009  . INGUINAL HERNIA, LEFT 11/03/2009  . Nonallergic rhinitis 08/24/2009  . VENTRAL HERNIA 05/20/2009  . DIVERTICULOSIS, COLON 05/20/2009  . NEVUS, ATYPICAL 10/26/2008  . GERD 10/01/2006    Scot Jun, PTA 02/12/2019, 9:26 AM  [place], [place], [address] Phone: [phone]   Fax:   [phone]  Name: Todd Larson MRN: [id number] Date of Birth: 04-21-1940

## 2019-02-16 ENCOUNTER — Ambulatory Visit: Payer: Medicare Other | Admitting: Physical Therapy

## 2019-02-16 ENCOUNTER — Encounter: Payer: Self-pay | Admitting: Physical Therapy

## 2019-02-16 ENCOUNTER — Other Ambulatory Visit: Payer: Self-pay

## 2019-02-16 DIAGNOSIS — M62830 Muscle spasm of back: Secondary | ICD-10-CM | POA: Diagnosis not present

## 2019-02-16 DIAGNOSIS — M546 Pain in thoracic spine: Secondary | ICD-10-CM | POA: Diagnosis not present

## 2019-02-16 NOTE — Therapy (Signed)
[place], [place], [address] Phone: [phone]   Fax:  [phone]  Physical Therapy Treatment  Patient Details  Name: Todd Larson MRN: [id number] Date of Birth: 1940/09/28 Referring Provider (PT): Lynne Leader   Encounter Date: 02/16/2019  PT End of Session - 02/16/19 1509    Visit Number  7    Date for PT Re-Evaluation  03/27/19    PT Start Time  1430    PT Stop Time  1524    PT Time Calculation (min)  54 min    Activity Tolerance  Patient tolerated treatment well    Behavior During Therapy  [facility] for tasks assessed/performed       Past Medical History:  Diagnosis Date  . Arthritis    cervical spine - T7-8 & lumbar area - bone spurs up & down the back   . Bulging discs     thoracic spine  . Diverticulosis of colon   . GERD (gastroesophageal reflux disease)    related to intestinal system, - has started Align  4-5 days ago-   . Hyperlipidemia   . Hypertension   . Inguinal hernia   . OSA (obstructive sleep apnea)    saw Dr. Annamaria Boots- 02/2015- pt. remarks that as a result BP med. was changed , pt. has a sleep number bed & he reports the score averages in the 80% range   . PSA elevation    History of   . Recurrent sinus infections    related to fracture nose x2, also reports that he has a deviated septum    Past Surgical History:  Procedure Laterality Date  . COLONOSCOPY  2008   3 internal hemorrhoids, diverticulosis;Dr.Jacobs  . INSERTION OF MESH N/A 11/13/2016   Procedure: INSERTION OF MESH;  Surgeon: Ralene Ok, MD;  Location: [place];  Service: General;  Laterality: N/A;  . LAPAROSCOPIC INGUINAL HERNIA WITH UMBILICAL HERNIA Left 23/55/7322   Procedure: LAPAROSCOPIC LEFT INGUINAL HERNIA  AND UMBILICAL HERNIA REPAIRS WITH MESH;  Surgeon: Ralene Ok, MD;  Location: [place];  Service: General;  Laterality: Left;  . PROSTATE BIOPSY     x 2, once abnormal, once normal  .  TONSILLECTOMY      There were no vitals filed for this visit.  Subjective Assessment - 02/16/19 1431    Subjective  "Pretty good"    Currently in Pain?  No/denies                       [facility] Adult PT Treatment/Exercise - 02/16/19 0001      Lumbar Exercises: Aerobic   UBE (Upper Arm Bike)  L4 x 2 min each     Nustep  L5 x 6 min       Lumbar Exercises: Machines for Strengthening   Cybex Lumbar Extension  black band 2x15     Cybex Knee Flexion  35lb 2x10      Leg Press  Leg press 40lb x10, 20lb x10     Other Lumbar Machine Exercise  Shoulder Ext 10lb 2x10     Other Lumbar Machine Exercise  Row & lats 35lb 2x10      Lumbar Exercises: Standing   Other Standing Lumbar Exercises  Shoulder ER red 2x10    Other Standing Lumbar Exercises  AR pess 20lb 2x10       Modalities   Modalities  Electrical Stimulation;Moist Heat  Moist Heat Therapy   Number Minutes Moist Heat  15 Minutes    Moist Heat Location  Lumbar Spine      Electrical Stimulation   Electrical Stimulation Location  thoracic area    Electrical Stimulation Action  IFC    Electrical Stimulation Parameters  sitting    Electrical Stimulation Goals  Pain               PT Short Term Goals - 02/05/19 1511      PT SHORT TERM GOAL #1   Title  independent with initial HEP    Status  Achieved        PT Long Term Goals - 02/05/19 1511      PT LONG TERM GOAL #1   Title  understand body mechanics    Status  Partially Met      PT LONG TERM GOAL #2   Title  decrease pain 50% with bed mobility    Status  On-going      PT LONG TERM GOAL #3   Title  increase lumbar ROM 25%    Status  On-going            Plan - 02/16/19 1510    Clinical Impression Statement  Pt did well overall today. Some initial discomfort laying back on leg press. Increase resistance tolerated with seated rows and lats. She reports that he could feel a pull in his low back with anti rotational pressed. Weakness with  AR presses with the resistance pulling to his L compared to his R.    Stability/Clinical Decision Making  Stable/Uncomplicated    Rehab Potential  Good    PT Frequency  2x / week    PT Duration  8 weeks    PT Treatment/Interventions  ADLs/Self Care Home Management;Cryotherapy;Electrical Stimulation;Moist Heat;Traction;Ultrasound;Therapeutic activities;Therapeutic exercise;Manual techniques;Patient/family education;Passive range of motion;Dry needling       Patient will benefit from skilled therapeutic intervention in order to improve the following deficits and impairments:  Pain, Improper body mechanics, Postural dysfunction, Increased muscle spasms, Decreased range of motion, Decreased strength, Impaired flexibility  Visit Diagnosis: Muscle spasm of back  Pain in thoracic spine     Problem List Patient Active Problem List   Diagnosis Date Noted  . Right knee meniscal tear 06/01/2015  . Obstructive sleep apnea 02/07/2015  . Thoracic spine pain 03/29/2014  . Tenosynovitis of thumb 03/29/2014  . Cervical radiculopathy due to degenerative joint disease of spine 10/10/2013  . Bursitis of left shoulder 09/23/2013  . Chronic low back pain 12/15/2012  . Chronic cervical pain 06/09/2012  . Hyperlipidemia 07/06/2010  . Essential hypertension 11/03/2009  . INGUINAL HERNIA, LEFT 11/03/2009  . Nonallergic rhinitis 08/24/2009  . VENTRAL HERNIA 05/20/2009  . DIVERTICULOSIS, COLON 05/20/2009  . NEVUS, ATYPICAL 10/26/2008  . GERD 10/01/2006    Scot Jun, PTA 02/16/2019, 3:12 PM  [place], [place], [address] Phone: [phone]   Fax:  [phone]  Name: Todd Larson MRN: [id number] Date of Birth: 1940-03-20

## 2019-02-19 ENCOUNTER — Ambulatory Visit: Payer: Medicare Other | Admitting: Physical Therapy

## 2019-02-23 ENCOUNTER — Ambulatory Visit: Payer: Medicare Other | Admitting: Physical Therapy

## 2019-02-23 ENCOUNTER — Other Ambulatory Visit: Payer: Self-pay

## 2019-02-23 ENCOUNTER — Encounter: Payer: Self-pay | Admitting: Physical Therapy

## 2019-02-23 DIAGNOSIS — M546 Pain in thoracic spine: Secondary | ICD-10-CM | POA: Diagnosis not present

## 2019-02-23 DIAGNOSIS — M62830 Muscle spasm of back: Secondary | ICD-10-CM | POA: Diagnosis not present

## 2019-02-23 NOTE — Therapy (Signed)
[place], [place], [address] Phone: [phone]   Fax:  [phone]  Physical Therapy Treatment  Patient Details  Name: Todd Larson MRN: [id number] Date of Birth: [birth year] Referring Provider (PT): Lynne Leader   Encounter Date: 02/23/2019  PT End of Session - 02/23/19 1511    Visit Number  8    Date for PT Re-Evaluation  03/27/19    PT Start Time  1430    PT Stop Time  1526    PT Time Calculation (min)  56 min    Activity Tolerance  Patient tolerated treatment well    Behavior During Therapy  [facility] for tasks assessed/performed       Past Medical History:  Diagnosis Date  . Arthritis    cervical spine - T7-8 & lumbar area - bone spurs up & down the back   . Bulging discs     thoracic spine  . Diverticulosis of colon   . GERD (gastroesophageal reflux disease)    related to intestinal system, - has started Align  4-5 days ago-   . Hyperlipidemia   . Hypertension   . Inguinal hernia   . OSA (obstructive sleep apnea)    saw Dr. Annamaria Boots- 02/2015- pt. remarks that as a result BP med. was changed , pt. has a sleep number bed & he reports the score averages in the 80% range   . PSA elevation    History of   . Recurrent sinus infections    related to fracture nose x2, also reports that he has a deviated septum    Past Surgical History:  Procedure Laterality Date  . COLONOSCOPY  2008   3 internal hemorrhoids, diverticulosis;Dr.Jacobs  . INSERTION OF MESH N/A 11/13/2016   Procedure: INSERTION OF MESH;  Surgeon: Ralene Ok, MD;  Location: [place];  Service: General;  Laterality: N/A;  . LAPAROSCOPIC INGUINAL HERNIA WITH UMBILICAL HERNIA Left 20/60/1561   Procedure: LAPAROSCOPIC LEFT INGUINAL HERNIA  AND UMBILICAL HERNIA REPAIRS WITH MESH;  Surgeon: Ralene Ok, MD;  Location: [place];  Service: General;  Laterality: Left;  . PROSTATE BIOPSY     x 2, once abnormal, once normal  .  TONSILLECTOMY      There were no vitals filed for this visit.  Subjective Assessment - 02/23/19 1435    Subjective  No too bad today. Thursday was tough, neck was sore, melt like a muscle in the back of his neck was cramping    Currently in Pain?  --   No now, but most evenings has some pain        [facility] PT Assessment - 02/23/19 0001      AROM   Overall AROM Comments  cervical ROM flexion WNL's, extension decreased 75%, rotation 50%, side bending decreased 50%, Lumbar ROM decreased 50% for Ext, shoulder ROM WFL's                   [facility] Adult PT Treatment/Exercise - 02/23/19 0001      Lumbar Exercises: Aerobic   UBE (Upper Arm Bike)  L4 x 2 min each     Nustep  L5 x 6 min       Lumbar Exercises: Machines for Strengthening   Cybex Lumbar Extension  black band 2x15     Cybex Knee Flexion  35lb 2x15    Leg Press  Leg press 20lb 3x10    Other Lumbar Machine Exercise  Row & lats 35lb 2x10      Lumbar Exercises: Standing   Other Standing Lumbar Exercises  Resisted gait black band all directions      Electrical Stimulation   Electrical Stimulation Location  thoracic area    Electrical Stimulation Action  IFC    Electrical Stimulation Parameters  sitting    Electrical Stimulation Goals  Pain               PT Short Term Goals - 02/05/19 1511      PT SHORT TERM GOAL #1   Title  independent with initial HEP    Status  Achieved        PT Long Term Goals - 02/23/19 1511      PT LONG TERM GOAL #1   Title  understand body mechanics    Status  Achieved      PT LONG TERM GOAL #2   Title  decrease pain 50% with bed mobility    Status  Partially Met      PT LONG TERM GOAL #3   Title  increase lumbar ROM 25%    Status  On-going      PT LONG TERM GOAL #4   Title  independent with advance HEP    Status  Achieved            Plan - 02/23/19 1520    Clinical Impression Statement  Pt has progressed some increasing her cervical Extension ROM. Good  stability noted with resisted gait. Increase reps tolerated with leg curls and extensions. Did not increase weight with rows and lats but reports that it felt heavier than normal. Report increase mobility at home.    Stability/Clinical Decision Making  Stable/Uncomplicated    Rehab Potential  Good    PT Frequency  2x / week    PT Duration  8 weeks    PT Treatment/Interventions  ADLs/Self Care Home Management;Cryotherapy;Electrical Stimulation;Moist Heat;Traction;Ultrasound;Therapeutic activities;Therapeutic exercise;Manual techniques;Patient/family education;Passive range of motion;Dry needling    PT Next Visit Plan  gym exercises, work on movements of the thoracic spine       Patient will benefit from skilled therapeutic intervention in order to improve the following deficits and impairments:  Pain, Improper body mechanics, Postural dysfunction, Increased muscle spasms, Decreased range of motion, Decreased strength, Impaired flexibility  Visit Diagnosis: Pain in thoracic spine  Muscle spasm of back     Problem List Patient Active Problem List   Diagnosis Date Noted  . Right knee meniscal tear 06/01/2015  . Obstructive sleep apnea 02/07/2015  . Thoracic spine pain 03/29/2014  . Tenosynovitis of thumb 03/29/2014  . Cervical radiculopathy due to degenerative joint disease of spine 10/10/2013  . Bursitis of left shoulder 09/23/2013  . Chronic low back pain 12/15/2012  . Chronic cervical pain 06/09/2012  . Hyperlipidemia 07/06/2010  . Essential hypertension 11/03/2009  . INGUINAL HERNIA, LEFT 11/03/2009  . Nonallergic rhinitis 08/24/2009  . VENTRAL HERNIA 05/20/2009  . DIVERTICULOSIS, COLON 05/20/2009  . NEVUS, ATYPICAL 10/26/2008  . GERD 10/01/2006    Scot Jun, PTA 02/23/2019, 3:22 PM  [place], [place], [address] Phone: [phone]   Fax:  [phone]  Name: Todd Larson MRN:  [id number] Date of Birth: 06-Jan-1940

## 2019-02-25 ENCOUNTER — Other Ambulatory Visit: Payer: Self-pay

## 2019-02-25 ENCOUNTER — Ambulatory Visit (INDEPENDENT_AMBULATORY_CARE_PROVIDER_SITE_OTHER): Payer: Medicare Other | Admitting: Family Medicine

## 2019-02-25 VITALS — Wt 236.0 lb

## 2019-02-25 DIAGNOSIS — M545 Low back pain, unspecified: Secondary | ICD-10-CM

## 2019-02-25 DIAGNOSIS — M542 Cervicalgia: Secondary | ICD-10-CM | POA: Diagnosis not present

## 2019-02-25 DIAGNOSIS — G8929 Other chronic pain: Secondary | ICD-10-CM

## 2019-02-25 NOTE — Patient Instructions (Addendum)
Thank you for coming in today. Please attend PT.  Recheck with me as needed.  Continue home exercises.

## 2019-02-25 NOTE — Progress Notes (Signed)
   I, [name], LAT, ATC, am serving as scribe for Dr. Lynne Leader.  MOHSIN GRUHLKE is a 79 y.o. male who presents to [place] [facility] today for f/u of his t-spine pain and muscle spasms worsened w/ pressure to the area as with sitting and laying supine.  He was last seen by Dr. Georgina Snell on 01/13/19 and was referred to outpatient PT of which he has completed 7 sessions.  He has tried a Medrol dose pack and Flexeril previously.  Since his last visit, pt reports significant symptom improvement.  He continues to have some chronic pain in his C-spine T-spine and L-spine areas.  Overall he is much improved from last time.  No significant radiating pain weakness or numbness distally.  Diagnostic imaging: T-spine XR- 01/07/19; C-spine MRI- 10/06/13  Pertinent review of systems: No fevers or chills  Relevant historical information: Hypertension.  Adrius received his 2 dose Pfizer COVID-19 vaccine January and February of this year.   Exam:  Wt 236 lb (107 kg)   BMI 35.62 kg/m  General: Well Developed, well nourished, and in no acute distress.   MSK: Spine: Nontender to midline.  Decreased cervical and lumbar motion. Normal upper and lower extremity motion and strength.  Normal gait.     Assessment and Plan: 79 y.o. male with significant improvement in pain in the C-spine T-spine and L-spine following physical therapy.  Patient continues to receive some physical therapy services and is continuing to improve.  Plan to complete physical therapy course and continue home exercise program.  Recheck back with me as needed.  Discussed next step treatments including further imaging including MRI for facet injection planning.  However I do not think this will be necessary.    Discussed warning signs or symptoms. Please see discharge instructions. Patient expresses understanding.   The above documentation has been reviewed and is accurate and complete Lynne Leader  Total encounter time  20 minutes including charting time date of service.

## 2019-02-26 ENCOUNTER — Ambulatory Visit: Payer: Medicare Other | Admitting: Physical Therapy

## 2019-02-26 ENCOUNTER — Other Ambulatory Visit: Payer: Self-pay

## 2019-02-26 ENCOUNTER — Encounter: Payer: Self-pay | Admitting: Physical Therapy

## 2019-02-26 DIAGNOSIS — M546 Pain in thoracic spine: Secondary | ICD-10-CM | POA: Diagnosis not present

## 2019-02-26 DIAGNOSIS — M62830 Muscle spasm of back: Secondary | ICD-10-CM

## 2019-02-26 NOTE — Therapy (Signed)
[place], [place], [address] Phone: [phone]   Fax:  [phone]  Physical Therapy Treatment  Patient Details  Name: Todd Larson MRN: [id number] Date of Birth: [birth year] Referring Provider (PT): Lynne Leader   Encounter Date: 02/26/2019  PT End of Session - 02/26/19 1430    Visit Number  9    Date for PT Re-Evaluation  03/27/19    PT Start Time  1345    PT Stop Time  1442    PT Time Calculation (min)  57 min    Activity Tolerance  Patient tolerated treatment well    Behavior During Therapy  [facility] for tasks assessed/performed       Past Medical History:  Diagnosis Date  . Arthritis    cervical spine - T7-8 & lumbar area - bone spurs up & down the back   . Bulging discs     thoracic spine  . Diverticulosis of colon   . GERD (gastroesophageal reflux disease)    related to intestinal system, - has started Align  4-5 days ago-   . Hyperlipidemia   . Hypertension   . Inguinal hernia   . OSA (obstructive sleep apnea)    saw Dr. Annamaria Boots- 02/2015- pt. remarks that as a result BP med. was changed , pt. has a sleep number bed & he reports the score averages in the 80% range   . PSA elevation    History of   . Recurrent sinus infections    related to fracture nose x2, also reports that he has a deviated septum    Past Surgical History:  Procedure Laterality Date  . COLONOSCOPY  2008   3 internal hemorrhoids, diverticulosis;Dr.Jacobs  . INSERTION OF MESH N/A 11/13/2016   Procedure: INSERTION OF MESH;  Surgeon: Ralene Ok, MD;  Location: [place];  Service: General;  Laterality: N/A;  . LAPAROSCOPIC INGUINAL HERNIA WITH UMBILICAL HERNIA Left 37/62/8315   Procedure: LAPAROSCOPIC LEFT INGUINAL HERNIA  AND UMBILICAL HERNIA REPAIRS WITH MESH;  Surgeon: Ralene Ok, MD;  Location: [place];  Service: General;  Laterality: Left;  . PROSTATE BIOPSY     x 2, once abnormal, once normal  .  TONSILLECTOMY      There were no vitals filed for this visit.  Subjective Assessment - 02/26/19 1348    Subjective  "Not too bad today, just a little slow when I get up"    Currently in Pain?  No/denies                       [facility] Adult PT Treatment/Exercise - 02/26/19 0001      Lumbar Exercises: Aerobic   Nustep  L5 x 6 min       Lumbar Exercises: Machines for Strengthening   Cybex Lumbar Extension  black band 2x15     Cybex Knee Extension  10lb 2x15    Cybex Knee Flexion  35lb 2x15    Other Lumbar Machine Exercise  Shoulder Ext 10lb 2x15     Other Lumbar Machine Exercise  Row & lats 25lb 2x15      Lumbar Exercises: Standing   Other Standing Lumbar Exercises  Hip Ext red tband 2x10     Other Standing Lumbar Exercises  Overhead back ext yellow ball 2x10, Trunk rotations yellow ball x 20      Lumbar Exercises: Seated   Sit to Stand  10 reps   x2  OHP yelow ball      Electrical Stimulation   Electrical Stimulation Location  thoracic area    Electrical Stimulation Action  IFC    Electrical Stimulation Parameters  sitting    Electrical Stimulation Goals  Pain               PT Short Term Goals - 02/05/19 1511      PT SHORT TERM GOAL #1   Title  independent with initial HEP    Status  Achieved        PT Long Term Goals - 02/23/19 1511      PT LONG TERM GOAL #1   Title  understand body mechanics    Status  Achieved      PT LONG TERM GOAL #2   Title  decrease pain 50% with bed mobility    Status  Partially Met      PT LONG TERM GOAL #3   Title  increase lumbar ROM 25%    Status  On-going      PT LONG TERM GOAL #4   Title  independent with advance HEP    Status  Achieved            Plan - 02/26/19 1431    Clinical Impression Statement  Pt is progressing overall. No reports of pain. Increase reps tolerated with seated rows and lats. Cues needed to keep shoulders down with extensions, and to keep shoulders back with seated rows.  Reports some difficulty getting up from his couch at home.    Stability/Clinical Decision Making  Stable/Uncomplicated    PT Frequency  2x / week    PT Duration  8 weeks    PT Treatment/Interventions  ADLs/Self Care Home Management;Cryotherapy;Electrical Stimulation;Moist Heat;Traction;Ultrasound;Therapeutic activities;Therapeutic exercise;Manual techniques;Patient/family education;Passive range of motion;Dry needling    PT Next Visit Plan  gym exercises, work on movements of the thoracic spine       Patient will benefit from skilled therapeutic intervention in order to improve the following deficits and impairments:     Visit Diagnosis: Pain in thoracic spine  Muscle spasm of back     Problem List Patient Active Problem List   Diagnosis Date Noted  . Right knee meniscal tear 06/01/2015  . Obstructive sleep apnea 02/07/2015  . Thoracic spine pain 03/29/2014  . Tenosynovitis of thumb 03/29/2014  . Cervical radiculopathy due to degenerative joint disease of spine 10/10/2013  . Bursitis of left shoulder 09/23/2013  . Chronic low back pain 12/15/2012  . Chronic cervical pain 06/09/2012  . Hyperlipidemia 07/06/2010  . Essential hypertension 11/03/2009  . INGUINAL HERNIA, LEFT 11/03/2009  . Nonallergic rhinitis 08/24/2009  . VENTRAL HERNIA 05/20/2009  . DIVERTICULOSIS, COLON 05/20/2009  . NEVUS, ATYPICAL 10/26/2008  . GERD 10/01/2006    Scot Jun, PTA 02/26/2019, 2:33 PM  [place], [place], [address] Phone: [phone]   Fax:  [phone]  Name: DOMINGO FUSON MRN: [id number] Date of Birth: 08/18/1940

## 2019-03-02 ENCOUNTER — Encounter: Payer: Self-pay | Admitting: Physical Therapy

## 2019-03-02 ENCOUNTER — Ambulatory Visit: Payer: Medicare Other | Attending: Family Medicine | Admitting: Physical Therapy

## 2019-03-02 ENCOUNTER — Other Ambulatory Visit: Payer: Self-pay

## 2019-03-02 DIAGNOSIS — M62830 Muscle spasm of back: Secondary | ICD-10-CM | POA: Diagnosis not present

## 2019-03-02 DIAGNOSIS — M546 Pain in thoracic spine: Secondary | ICD-10-CM | POA: Diagnosis not present

## 2019-03-02 NOTE — Therapy (Signed)
[place] [address] [place], [place], [address] Phone: [phone]   Fax:  [phone] Progress Note Reporting Period 01/27/19 to 03/02/19 for the first 10 visits See note below for Objective Data and Assessment of Progress/Goals.      Physical Therapy Treatment  Patient Details  Name: Todd Larson MRN: [id number] Date of Birth: March 20, 1940 Referring Provider (PT): Lynne Leader   Encounter Date: 03/02/2019  PT End of Session - 03/02/19 1056    Visit Number  10    Date for PT Re-Evaluation  03/27/19    PT Start Time  [id number]    PT Stop Time  1111    PT Time Calculation (min)  56 min    Activity Tolerance  Patient tolerated treatment well    Behavior During Therapy  [facility] for tasks assessed/performed       Past Medical History:  Diagnosis Date  . Arthritis    cervical spine - T7-8 & lumbar area - bone spurs up & down the back   . Bulging discs     thoracic spine  . Diverticulosis of colon   . GERD (gastroesophageal reflux disease)    related to intestinal system, - has started Align  4-5 days ago-   . Hyperlipidemia   . Hypertension   . Inguinal hernia   . OSA (obstructive sleep apnea)    saw Dr. Annamaria Boots- 02/2015- pt. remarks that as a result BP med. was changed , pt. has a sleep number bed & he reports the score averages in the 80% range   . PSA elevation    History of   . Recurrent sinus infections    related to fracture nose x2, also reports that he has a deviated septum    Past Surgical History:  Procedure Laterality Date  . COLONOSCOPY  2008   3 internal hemorrhoids, diverticulosis;Dr.Jacobs  . INSERTION OF MESH N/A 11/13/2016   Procedure: INSERTION OF MESH;  Surgeon: Ralene Ok, MD;  Location: [place];  Service: General;  Laterality: N/A;  . LAPAROSCOPIC INGUINAL HERNIA WITH UMBILICAL HERNIA Left [id number]   Procedure: LAPAROSCOPIC LEFT INGUINAL HERNIA  AND UMBILICAL HERNIA REPAIRS WITH MESH;   Surgeon: Ralene Ok, MD;  Location: [place];  Service: General;  Laterality: Left;  . PROSTATE BIOPSY     x 2, once abnormal, once normal  . TONSILLECTOMY      There were no vitals filed for this visit.  Subjective Assessment - 03/02/19 1015    Subjective  "Not too bad today"    Currently in Pain?  No/denies                       [facility] Adult PT Treatment/Exercise - 03/02/19 0001      Therapeutic Activites    Therapeutic Activities  Other Therapeutic Activities    Other Therapeutic Activities  Coming to full standing on mat without UE support      Lumbar Exercises: Aerobic   UBE (Upper Arm Bike)  L4 x 2 min each     Nustep  L5 x 6 min       Lumbar Exercises: Machines for Strengthening   Cybex Lumbar Extension  black band 2x15     Cybex Knee Extension  10lb 2x15    Cybex Knee Flexion  35lb 2x15    Other Lumbar Machine Exercise  Row & lats 35lb 2x15      Lumbar Exercises: Standing  Other Standing Lumbar Exercises  Straight arm pull downs 35lb 2x10     Other Standing Lumbar Exercises  Overhead back ext yellow ball 2x10, Trunk rotations yellow ball x 20      Electrical Stimulation   Electrical Stimulation Location  thoracic area    Electrical Stimulation Action  IFC    Electrical Stimulation Parameters  sitting    Electrical Stimulation Goals  Pain               PT Short Term Goals - 02/05/19 1511      PT SHORT TERM GOAL #1   Title  independent with initial HEP    Status  Achieved        PT Long Term Goals - 03/02/19 1057      PT LONG TERM GOAL #1   Title  understand body mechanics    Status  Achieved            Plan - 03/02/19 1057    Clinical Impression Statement  Pt voiced concern about getting up off the floor. He has no issues getting up with chair or counter near by. He was able to come to full standing 2 times without UE assist on mat table but very difficulty. He reports that weigh tans age are his limiting factor. He did  well with the exercises. Increase fatigue with rows and lats under additional load.    Stability/Clinical Decision Making  Stable/Uncomplicated    Rehab Potential  Good    PT Frequency  2x / week    PT Duration  8 weeks    PT Treatment/Interventions  ADLs/Self Care Home Management;Cryotherapy;Electrical Stimulation;Moist Heat;Traction;Ultrasound;Therapeutic activities;Therapeutic exercise;Manual techniques;Patient/family education;Passive range of motion;Dry needling    PT Next Visit Plan  gym exercises, work on movements of the thoracic spine       Patient will benefit from skilled therapeutic intervention in order to improve the following deficits and impairments:  Pain, Improper body mechanics, Postural dysfunction, Increased muscle spasms, Decreased range of motion, Decreased strength, Impaired flexibility  Visit Diagnosis: Muscle spasm of back  Pain in thoracic spine     Problem List Patient Active Problem List   Diagnosis Date Noted  . Right knee meniscal tear 06/01/2015  . Obstructive sleep apnea 02/07/2015  . Thoracic spine pain 03/29/2014  . Tenosynovitis of thumb 03/29/2014  . Cervical radiculopathy due to degenerative joint disease of spine 10/10/2013  . Bursitis of left shoulder 09/23/2013  . Chronic low back pain 12/15/2012  . Chronic cervical pain 06/09/2012  . Hyperlipidemia 07/06/2010  . Essential hypertension 11/03/2009  . INGUINAL HERNIA, LEFT 11/03/2009  . Nonallergic rhinitis 08/24/2009  . VENTRAL HERNIA 05/20/2009  . DIVERTICULOSIS, COLON 05/20/2009  . NEVUS, ATYPICAL 10/26/2008  . GERD 10/01/2006    Scot Jun, PTA 03/02/2019, 11:00 AM  [place], [place], [address] Phone: [phone]   Fax:  [phone]  Name: Todd Larson MRN: [id number] Date of Birth: May 12, 1940

## 2019-03-05 ENCOUNTER — Encounter: Payer: Self-pay | Admitting: Physical Therapy

## 2019-03-05 ENCOUNTER — Ambulatory Visit: Payer: Medicare Other | Admitting: Physical Therapy

## 2019-03-05 ENCOUNTER — Other Ambulatory Visit: Payer: Self-pay

## 2019-03-05 DIAGNOSIS — M546 Pain in thoracic spine: Secondary | ICD-10-CM | POA: Diagnosis not present

## 2019-03-05 DIAGNOSIS — M62830 Muscle spasm of back: Secondary | ICD-10-CM | POA: Diagnosis not present

## 2019-03-05 NOTE — Therapy (Signed)
[place], [place], [address] Phone: [phone]   Fax:  [phone]  Physical Therapy Treatment  Patient Details  Name: Todd Larson MRN: [id number] Date of Birth: 01/10/1940 Referring Provider (PT): Lynne Leader   Encounter Date: 03/05/2019  PT End of Session - 03/05/19 1056    Visit Number  11    Date for PT Re-Evaluation  03/27/19    PT Start Time  [id number]    PT Stop Time  1111    PT Time Calculation (min)  56 min    Activity Tolerance  Patient tolerated treatment well    Behavior During Therapy  [facility] for tasks assessed/performed       Past Medical History:  Diagnosis Date  . Arthritis    cervical spine - T7-8 & lumbar area - bone spurs up & down the back   . Bulging discs     thoracic spine  . Diverticulosis of colon   . GERD (gastroesophageal reflux disease)    related to intestinal system, - has started Align  4-5 days ago-   . Hyperlipidemia   . Hypertension   . Inguinal hernia   . OSA (obstructive sleep apnea)    saw Dr. Annamaria Boots- 02/2015- pt. remarks that as a result BP med. was changed , pt. has a sleep number bed & he reports the score averages in the 80% range   . PSA elevation    History of   . Recurrent sinus infections    related to fracture nose x2, also reports that he has a deviated septum    Past Surgical History:  Procedure Laterality Date  . COLONOSCOPY  2008   3 internal hemorrhoids, diverticulosis;Dr.Jacobs  . INSERTION OF MESH N/A 11/13/2016   Procedure: INSERTION OF MESH;  Surgeon: Ralene Ok, MD;  Location: [place];  Service: General;  Laterality: N/A;  . LAPAROSCOPIC INGUINAL HERNIA WITH UMBILICAL HERNIA Left [id number]   Procedure: LAPAROSCOPIC LEFT INGUINAL HERNIA  AND UMBILICAL HERNIA REPAIRS WITH MESH;  Surgeon: Ralene Ok, MD;  Location: [place];  Service: General;  Laterality: Left;  . PROSTATE BIOPSY     x 2, once abnormal, once normal  .  TONSILLECTOMY      There were no vitals filed for this visit.  Subjective Assessment - 03/05/19 1017    Subjective  "No too bad" less pain getting in and out of bed    Currently in Pain?  Yes                       [facility] Adult PT Treatment/Exercise - 03/05/19 0001      Ambulation/Gait   Stairs  Yes    Stairs Assistance  6: Modified independent (Device/Increase time)    Stair Management Technique  One rail Left;Alternating pattern;Forwards    Gait Comments  Unilat Overhead farmers carry 5lb      Lumbar Exercises: Aerobic   UBE (Upper Arm Bike)  L4 x 3 min each     Recumbent Bike  L1 x 3 min       Lumbar Exercises: Machines for Strengthening   Cybex Lumbar Extension  black band 2x15     Other Lumbar Machine Exercise  Row & lats 35lb 2x15      Lumbar Exercises: Standing   Other Standing Lumbar Exercises  Hip Ext & abd red tband x10; Ressited gait 30lb x 3 each     Other  Standing Lumbar Exercises  straight arm pull downs 25lb 2x10      Electrical Stimulation   Electrical Stimulation Location  thoracic area    Electrical Stimulation Action  IFC    Electrical Stimulation Parameters  sitting    Electrical Stimulation Goals  Pain               PT Short Term Goals - 02/05/19 1511      PT SHORT TERM GOAL #1   Title  independent with initial HEP    Status  Achieved        PT Long Term Goals - 03/02/19 1057      PT LONG TERM GOAL #1   Title  understand body mechanics    Status  Achieved            Plan - 03/05/19 1057    Clinical Impression Statement  Pt did well overall. Added some stair negotiation and overhead farmers carry. Reports no issues with the stairs today, but reports that he avoids then when he can because at times he feels like his R knee will buckle. He reports a fall on his R knee years ago. Cues  needed to keep core engaged with farmers carry and straight arm pull downs. Some hip weakness noted R>L with standing hip exercises.     Stability/Clinical Decision Making  Stable/Uncomplicated    Rehab Potential  Good    PT Frequency  2x / week    PT Duration  8 weeks    PT Treatment/Interventions  ADLs/Self Care Home Management;Cryotherapy;Electrical Stimulation;Moist Heat;Traction;Ultrasound;Therapeutic activities;Therapeutic exercise;Manual techniques;Patient/family education;Passive range of motion;Dry needling    PT Next Visit Plan  gym exercises, work on movements of the thoracic spine       Patient will benefit from skilled therapeutic intervention in order to improve the following deficits and impairments:  Pain, Improper body mechanics, Postural dysfunction, Increased muscle spasms, Decreased range of motion, Decreased strength, Impaired flexibility  Visit Diagnosis: Muscle spasm of back  Pain in thoracic spine     Problem List Patient Active Problem List   Diagnosis Date Noted  . Right knee meniscal tear 06/01/2015  . Obstructive sleep apnea 02/07/2015  . Thoracic spine pain 03/29/2014  . Tenosynovitis of thumb 03/29/2014  . Cervical radiculopathy due to degenerative joint disease of spine 10/10/2013  . Bursitis of left shoulder 09/23/2013  . Chronic low back pain 12/15/2012  . Chronic cervical pain 06/09/2012  . Hyperlipidemia 07/06/2010  . Essential hypertension 11/03/2009  . INGUINAL HERNIA, LEFT 11/03/2009  . Nonallergic rhinitis 08/24/2009  . VENTRAL HERNIA 05/20/2009  . DIVERTICULOSIS, COLON 05/20/2009  . NEVUS, ATYPICAL 10/26/2008  . GERD 10/01/2006    Scot Jun, PTA 03/05/2019, 11:02 AM  [place], [place], [address] Phone: [phone]   Fax:  [phone]  Name: Todd Larson MRN: [id number] Date of Birth: 01/16/1940

## 2019-03-10 ENCOUNTER — Encounter: Payer: Self-pay | Admitting: Physical Therapy

## 2019-03-10 ENCOUNTER — Ambulatory Visit: Payer: Medicare Other | Admitting: Physical Therapy

## 2019-03-10 ENCOUNTER — Other Ambulatory Visit: Payer: Self-pay

## 2019-03-10 DIAGNOSIS — M62830 Muscle spasm of back: Secondary | ICD-10-CM | POA: Diagnosis not present

## 2019-03-10 DIAGNOSIS — M546 Pain in thoracic spine: Secondary | ICD-10-CM

## 2019-03-10 NOTE — Therapy (Signed)
[place], [place], [address] Phone: [phone]   Fax:  [phone]  Physical Therapy Treatment  Patient Details  Name: Todd Larson MRN: [id number] Date of Birth: [birth year] Referring Provider (PT): Lynne Leader   Encounter Date: 03/10/2019  PT End of Session - 03/10/19 1047    Visit Number  12    Date for PT Re-Evaluation  03/27/19    PT Start Time  7262    PT Stop Time  1100    PT Time Calculation (min)  45 min    Activity Tolerance  Patient tolerated treatment well    Behavior During Therapy  [facility] for tasks assessed/performed       Past Medical History:  Diagnosis Date  . Arthritis    cervical spine - T7-8 & lumbar area - bone spurs up & down the back   . Bulging discs     thoracic spine  . Diverticulosis of colon   . GERD (gastroesophageal reflux disease)    related to intestinal system, - has started Align  4-5 days ago-   . Hyperlipidemia   . Hypertension   . Inguinal hernia   . OSA (obstructive sleep apnea)    saw Dr. Annamaria Boots- 02/2015- pt. remarks that as a result BP med. was changed , pt. has a sleep number bed & he reports the score averages in the 80% range   . PSA elevation    History of   . Recurrent sinus infections    related to fracture nose x2, also reports that he has a deviated septum    Past Surgical History:  Procedure Laterality Date  . COLONOSCOPY  2008   3 internal hemorrhoids, diverticulosis;Dr.Jacobs  . INSERTION OF MESH N/A 11/13/2016   Procedure: INSERTION OF MESH;  Surgeon: Ralene Ok, MD;  Location: [place];  Service: General;  Laterality: N/A;  . LAPAROSCOPIC INGUINAL HERNIA WITH UMBILICAL HERNIA Left 03/55/9741   Procedure: LAPAROSCOPIC LEFT INGUINAL HERNIA  AND UMBILICAL HERNIA REPAIRS WITH MESH;  Surgeon: Ralene Ok, MD;  Location: [place];  Service: General;  Laterality: Left;  . PROSTATE BIOPSY     x 2, once abnormal, once normal  .  TONSILLECTOMY      There were no vitals filed for this visit.  Subjective Assessment - 03/10/19 1023    Subjective  Less tightness in the upper back, doing well    Currently in Pain?  No/denies                       [facility] Adult PT Treatment/Exercise - 03/10/19 0001      Lumbar Exercises: Aerobic   Stationary Bike  L2 x 4 min     UBE (Upper Arm Bike)  L4 x 3 min each       Lumbar Exercises: Machines for Strengthening   Cybex Lumbar Extension  black band 2x15     Cybex Knee Extension  10lb 2x15    Cybex Knee Flexion  35lb 2x15    Other Lumbar Machine Exercise  Shoulder Ext 10lb 2x15     Other Lumbar Machine Exercise  Row & lats 35lb 2x10      Lumbar Exercises: Standing   Other Standing Lumbar Exercises  Hip Ext & abd red tband x10; Ressited gait 30lb x 3 each     Other Standing Lumbar Exercises  Overhead back ext yellow ball 2x10, Trunk rotations yellow ball x 20  Special educational needs teacher  IFC    Electrical Stimulation Parameters  sitting    Electrical Stimulation Goals  Pain               PT Short Term Goals - 02/05/19 1511      PT SHORT TERM GOAL #1   Title  independent with initial HEP    Status  Achieved        PT Long Term Goals - 03/10/19 1052      PT LONG TERM GOAL #1   Title  understand body mechanics    Status  Achieved      PT LONG TERM GOAL #2   Title  decrease pain 50% with bed mobility    Status  Partially Met      PT LONG TERM GOAL #3   Title  increase lumbar ROM 25%    Status  On-going      PT LONG TERM GOAL #4   Title  independent with advance HEP    Status  Achieved            Plan - 03/10/19 1052    Clinical Impression Statement  Pt reports less  tightness in his upper back this weekend. Reports more squatting cleaning up after his cat . No issues with today's interventions.  Postural cues required with seated rows. Tactile  cues to low back with overhead extensions. More difficulty rotating to his L with trunk rotations.    Stability/Clinical Decision Making  Stable/Uncomplicated    Rehab Potential  Good    PT Frequency  2x / week    PT Duration  8 weeks    PT Treatment/Interventions  ADLs/Self Care Home Management;Cryotherapy;Electrical Stimulation;Moist Heat;Traction;Ultrasound;Therapeutic activities;Therapeutic exercise;Manual techniques;Patient/family education;Passive range of motion;Dry needling    PT Next Visit Plan  gym exercises, work on movements of the thoracic spine       Patient will benefit from skilled therapeutic intervention in order to improve the following deficits and impairments:  Pain, Improper body mechanics, Postural dysfunction, Increased muscle spasms, Decreased range of motion, Decreased strength, Impaired flexibility  Visit Diagnosis: Pain in thoracic spine  Muscle spasm of back     Problem List Patient Active Problem List   Diagnosis Date Noted  . Right knee meniscal tear 06/01/2015  . Obstructive sleep apnea 02/07/2015  . Thoracic spine pain 03/29/2014  . Tenosynovitis of thumb 03/29/2014  . Cervical radiculopathy due to degenerative joint disease of spine 10/10/2013  . Bursitis of left shoulder 09/23/2013  . Chronic low back pain 12/15/2012  . Chronic cervical pain 06/09/2012  . Hyperlipidemia 07/06/2010  . Essential hypertension 11/03/2009  . INGUINAL HERNIA, LEFT 11/03/2009  . Nonallergic rhinitis 08/24/2009  . VENTRAL HERNIA 05/20/2009  . DIVERTICULOSIS, COLON 05/20/2009  . NEVUS, ATYPICAL 10/26/2008  . GERD 10/01/2006    Scot Jun, PTA 03/10/2019, 10:56 AM  [place], [place], [address] Phone: [phone]   Fax:  [phone]  Name: DORRIS VANGORDER MRN: [id number] Date of Birth: May 18, 1940

## 2019-03-13 ENCOUNTER — Ambulatory Visit: Payer: Medicare Other | Admitting: Physical Therapy

## 2019-03-13 ENCOUNTER — Other Ambulatory Visit: Payer: Self-pay

## 2019-03-13 ENCOUNTER — Encounter: Payer: Self-pay | Admitting: Physical Therapy

## 2019-03-13 DIAGNOSIS — M62830 Muscle spasm of back: Secondary | ICD-10-CM | POA: Diagnosis not present

## 2019-03-13 DIAGNOSIS — M546 Pain in thoracic spine: Secondary | ICD-10-CM | POA: Diagnosis not present

## 2019-03-13 NOTE — Therapy (Signed)
[place], [place], [address] Phone: [phone]   Fax:  [phone]  Physical Therapy Treatment  Patient Details  Name: Todd Larson MRN: [id number] Date of Birth: 1940-06-26 Referring Provider (PT): Lynne Leader   Encounter Date: 03/13/2019  PT End of Session - 03/13/19 1052    Visit Number  13    Date for PT Re-Evaluation  03/27/19    PT Start Time  [id number]    PT Stop Time  1110    PT Time Calculation (min)  55 min    Activity Tolerance  Patient tolerated treatment well    Behavior During Therapy  [facility] for tasks assessed/performed       Past Medical History:  Diagnosis Date  . Arthritis    cervical spine - T7-8 & lumbar area - bone spurs up & down the back   . Bulging discs     thoracic spine  . Diverticulosis of colon   . GERD (gastroesophageal reflux disease)    related to intestinal system, - has started Align  4-5 days ago-   . Hyperlipidemia   . Hypertension   . Inguinal hernia   . OSA (obstructive sleep apnea)    saw Dr. Annamaria Boots- 02/2015- pt. remarks that as a result BP med. was changed , pt. has a sleep number bed & he reports the score averages in the 80% range   . PSA elevation    History of   . Recurrent sinus infections    related to fracture nose x2, also reports that he has a deviated septum    Past Surgical History:  Procedure Laterality Date  . COLONOSCOPY  2008   3 internal hemorrhoids, diverticulosis;Dr.Jacobs  . INSERTION OF MESH N/A 11/13/2016   Procedure: INSERTION OF MESH;  Surgeon: Ralene Ok, MD;  Location: [place];  Service: General;  Laterality: N/A;  . LAPAROSCOPIC INGUINAL HERNIA WITH UMBILICAL HERNIA Left [id number]   Procedure: LAPAROSCOPIC LEFT INGUINAL HERNIA  AND UMBILICAL HERNIA REPAIRS WITH MESH;  Surgeon: Ralene Ok, MD;  Location: [place];  Service: General;  Laterality: Left;  . PROSTATE BIOPSY     x 2, once abnormal, once normal  .  TONSILLECTOMY      There were no vitals filed for this visit.  Subjective Assessment - 03/13/19 1015    Subjective  "All right"    Currently in Pain?  No/denies                       [facility] Adult PT Treatment/Exercise - 03/13/19 0001      Lumbar Exercises: Aerobic   Recumbent Bike  L1 x 3 min     Nustep  L5 x 6 min       Lumbar Exercises: Machines for Strengthening   Cybex Lumbar Extension  black band 2x15     Cybex Knee Extension  10lb 2x15    Cybex Knee Flexion  35lb 3x10    Other Lumbar Machine Exercise  Shoulder Ext 10lb 2x15     Other Lumbar Machine Exercise  Row & lats 35lb 2x10      Lumbar Exercises: Standing   Other Standing Lumbar Exercises  Overhead back ext yellow ball 2x10, Trunk rotations yellow ball x 20      Lumbar Exercises: Seated   Other Seated Lumbar Exercises  ab sets with pball 2x10       Electrical Stimulation   Electrical Stimulation  Location  thoracic area    Electrical Stimulation Action  IFC    Electrical Stimulation Parameters  sitting    Electrical Stimulation Goals  Pain               PT Short Term Goals - 02/05/19 1511      PT SHORT TERM GOAL #1   Title  independent with initial HEP    Status  Achieved        PT Long Term Goals - 03/13/19 1053      PT LONG TERM GOAL #1   Title  understand body mechanics            Plan - 03/13/19 1053    Clinical Impression Statement  Pt reports that he is doing well overall, he reports some pain at night when turning in the bed at times. Good strength and ROM with all exercises. He reports a pulling sensation in his back with ab sets. Tactile cues to keep shoulders down with extensions.    Stability/Clinical Decision Making  Stable/Uncomplicated    Rehab Potential  Good    PT Frequency  2x / week    PT Duration  8 weeks    PT Treatment/Interventions  ADLs/Self Care Home Management;Cryotherapy;Electrical Stimulation;Moist Heat;Traction;Ultrasound;Therapeutic  activities;Therapeutic exercise;Manual techniques;Patient/family education;Passive range of motion;Dry needling    PT Next Visit Plan  gym exercises, work on movements of the thoracic spine       Patient will benefit from skilled therapeutic intervention in order to improve the following deficits and impairments:  Pain, Improper body mechanics, Postural dysfunction, Increased muscle spasms, Decreased range of motion, Decreased strength, Impaired flexibility  Visit Diagnosis: Pain in thoracic spine  Muscle spasm of back     Problem List Patient Active Problem List   Diagnosis Date Noted  . Right knee meniscal tear 06/01/2015  . Obstructive sleep apnea 02/07/2015  . Thoracic spine pain 03/29/2014  . Tenosynovitis of thumb 03/29/2014  . Cervical radiculopathy due to degenerative joint disease of spine 10/10/2013  . Bursitis of left shoulder 09/23/2013  . Chronic low back pain 12/15/2012  . Chronic cervical pain 06/09/2012  . Hyperlipidemia 07/06/2010  . Essential hypertension 11/03/2009  . INGUINAL HERNIA, LEFT 11/03/2009  . Nonallergic rhinitis 08/24/2009  . VENTRAL HERNIA 05/20/2009  . DIVERTICULOSIS, COLON 05/20/2009  . NEVUS, ATYPICAL 10/26/2008  . GERD 10/01/2006    Scot Jun, PTA 03/13/2019, 10:59 AM  [place], [place], [address] Phone: [phone]   Fax:  [phone]  Name: ANTWION HAYDU MRN: [id number] Date of Birth: 1940-12-30

## 2019-03-16 ENCOUNTER — Ambulatory Visit: Payer: Medicare Other | Admitting: Physical Therapy

## 2019-03-16 ENCOUNTER — Encounter: Payer: Self-pay | Admitting: Physical Therapy

## 2019-03-16 ENCOUNTER — Other Ambulatory Visit: Payer: Self-pay

## 2019-03-16 DIAGNOSIS — M62830 Muscle spasm of back: Secondary | ICD-10-CM | POA: Diagnosis not present

## 2019-03-16 DIAGNOSIS — M546 Pain in thoracic spine: Secondary | ICD-10-CM

## 2019-03-16 NOTE — Therapy (Signed)
[place], [place], [address] Phone: [phone]   Fax:  [phone]  Physical Therapy Treatment  Patient Details  Name: Todd Larson MRN: [id number] Date of Birth: Aug 26, 1940 Referring Provider (PT): Lynne Leader   Encounter Date: 03/16/2019  PT End of Session - 03/16/19 1054    Visit Number  14    Date for PT Re-Evaluation  03/27/19    PT Start Time  0930    PT Stop Time  1015    PT Time Calculation (min)  45 min    Activity Tolerance  Patient tolerated treatment well    Behavior During Therapy  [facility] for tasks assessed/performed       Past Medical History:  Diagnosis Date  . Arthritis    cervical spine - T7-8 & lumbar area - bone spurs up & down the back   . Bulging discs     thoracic spine  . Diverticulosis of colon   . GERD (gastroesophageal reflux disease)    related to intestinal system, - has started Align  4-5 days ago-   . Hyperlipidemia   . Hypertension   . Inguinal hernia   . OSA (obstructive sleep apnea)    saw Dr. Annamaria Boots- 02/2015- pt. remarks that as a result BP med. was changed , pt. has a sleep number bed & he reports the score averages in the 80% range   . PSA elevation    History of   . Recurrent sinus infections    related to fracture nose x2, also reports that he has a deviated septum    Past Surgical History:  Procedure Laterality Date  . COLONOSCOPY  2008   3 internal hemorrhoids, diverticulosis;Dr.Jacobs  . INSERTION OF MESH N/A 11/13/2016   Procedure: INSERTION OF MESH;  Surgeon: Ralene Ok, MD;  Location: [place];  Service: General;  Laterality: N/A;  . LAPAROSCOPIC INGUINAL HERNIA WITH UMBILICAL HERNIA Left [id number]   Procedure: LAPAROSCOPIC LEFT INGUINAL HERNIA  AND UMBILICAL HERNIA REPAIRS WITH MESH;  Surgeon: Ralene Ok, MD;  Location: [place];  Service: General;  Laterality: Left;  . PROSTATE BIOPSY     x 2, once abnormal, once normal  .  TONSILLECTOMY      There were no vitals filed for this visit.  Subjective Assessment - 03/16/19 1018    Subjective  "Good" muscles are sore over night, still walking a mile a day    Currently in Pain?  No/denies         [facility] PT Assessment - 03/16/19 0001      AROM   Overall AROM   Deficits    Overall AROM Comments      AROM Assessment Site  Cervical;Lumbar    Cervical Flexion  WFL    Cervical Extension  75% limited    Cervical - Right Side Bend  75% limited    Cervical - Left Side Bend  75% limited    Cervical - Right Rotation  75% limited    Cervical - Left Rotation  WFL    Lumbar Flexion  25% limited    Lumbar Extension  50% limited    Lumbar - Right Side Bend  WFL    Lumbar - Left Side Bend  25% limited    Lumbar - Right Rotation  WFL    Lumbar - Left Rotation  [facility]  [place] Adult PT Treatment/Exercise - 03/16/19 0001      Lumbar Exercises: Aerobic   Recumbent Bike  L1 x 4 min     Nustep  L5 x 6 min       Lumbar Exercises: Machines for Strengthening   Cybex Knee Extension  10lb 3x10    Cybex Knee Flexion  35lb 3x10    Other Lumbar Machine Exercise  Shoulder Ext 10lb 2x15     Other Lumbar Machine Exercise  Row & lats 35lb 2x15      Lumbar Exercises: Seated   Other Seated Lumbar Exercises  ab sets with pball 2x10     Other Seated Lumbar Exercises  Sit to stand from mat holding 7lb dumbbell 2x15               PT Short Term Goals - 02/05/19 1511      PT SHORT TERM GOAL #1   Title  independent with initial HEP    Status  Achieved        PT Long Term Goals - 03/13/19 1053      PT LONG TERM GOAL #1   Title  understand body mechanics            Plan - 03/16/19 1055    Clinical Impression Statement  Pt with a slight increase in lumbar and cervical ROM. Cervical and side bending is really limited. Pt did have some fatigue with sit to stand. Tactile cues needed with seated rows and extensions. We did go over log  rolling to get in and out of bed.    Stability/Clinical Decision Making  Stable/Uncomplicated    Rehab Potential  Good    PT Frequency  2x / week    PT Duration  8 weeks    PT Treatment/Interventions  ADLs/Self Care Home Management;Cryotherapy;Electrical Stimulation;Moist Heat;Traction;Ultrasound;Therapeutic activities;Therapeutic exercise;Manual techniques;Patient/family education;Passive range of motion;Dry needling    PT Next Visit Plan  gym exercises, work on movements of the thoracic spine       Patient will benefit from skilled therapeutic intervention in order to improve the following deficits and impairments:  Pain, Improper body mechanics, Postural dysfunction, Increased muscle spasms, Decreased range of motion, Decreased strength, Impaired flexibility  Visit Diagnosis: Pain in thoracic spine  Muscle spasm of back     Problem List Patient Active Problem List   Diagnosis Date Noted  . Right knee meniscal tear 06/01/2015  . Obstructive sleep apnea 02/07/2015  . Thoracic spine pain 03/29/2014  . Tenosynovitis of thumb 03/29/2014  . Cervical radiculopathy due to degenerative joint disease of spine 10/10/2013  . Bursitis of left shoulder 09/23/2013  . Chronic low back pain 12/15/2012  . Chronic cervical pain 06/09/2012  . Hyperlipidemia 07/06/2010  . Essential hypertension 11/03/2009  . INGUINAL HERNIA, LEFT 11/03/2009  . Nonallergic rhinitis 08/24/2009  . VENTRAL HERNIA 05/20/2009  . DIVERTICULOSIS, COLON 05/20/2009  . NEVUS, ATYPICAL 10/26/2008  . GERD 10/01/2006    Scot Jun 03/16/2019, 10:58 AM  [place], [place], [address] Phone: [phone]   Fax:  [phone]  Name: TABITHA BALLENGEE MRN: [id number] Date of Birth: 1940-03-24

## 2019-03-19 ENCOUNTER — Encounter: Payer: Self-pay | Admitting: Physical Therapy

## 2019-03-19 ENCOUNTER — Other Ambulatory Visit: Payer: Self-pay

## 2019-03-19 ENCOUNTER — Ambulatory Visit: Payer: Medicare Other | Admitting: Physical Therapy

## 2019-03-19 DIAGNOSIS — M62830 Muscle spasm of back: Secondary | ICD-10-CM | POA: Diagnosis not present

## 2019-03-19 DIAGNOSIS — M546 Pain in thoracic spine: Secondary | ICD-10-CM

## 2019-03-19 NOTE — Therapy (Signed)
[place], [place], [address] Phone: [phone]   Fax:  [phone]  Physical Therapy Treatment  Patient Details  Name: Todd Larson MRN: [id number] Date of Birth: 01/22/1940 Referring Provider (PT): Lynne Leader   Encounter Date: 03/19/2019  PT End of Session - 03/19/19 1140    Visit Number  15    Date for PT Re-Evaluation  03/27/19    PT Start Time  1100    PT Stop Time  1145    PT Time Calculation (min)  45 min    Activity Tolerance  Patient tolerated treatment well    Behavior During Therapy  [facility] for tasks assessed/performed       Past Medical History:  Diagnosis Date  . Arthritis    cervical spine - T7-8 & lumbar area - bone spurs up & down the back   . Bulging discs     thoracic spine  . Diverticulosis of colon   . GERD (gastroesophageal reflux disease)    related to intestinal system, - has started Align  4-5 days ago-   . Hyperlipidemia   . Hypertension   . Inguinal hernia   . OSA (obstructive sleep apnea)    saw Dr. Annamaria Boots- 02/2015- pt. remarks that as a result BP med. was changed , pt. has a sleep number bed & he reports the score averages in the 80% range   . PSA elevation    History of   . Recurrent sinus infections    related to fracture nose x2, also reports that he has a deviated septum    Past Surgical History:  Procedure Laterality Date  . COLONOSCOPY  2008   3 internal hemorrhoids, diverticulosis;Dr.Jacobs  . INSERTION OF MESH N/A 11/13/2016   Procedure: INSERTION OF MESH;  Surgeon: Ralene Ok, MD;  Location: [place];  Service: General;  Laterality: N/A;  . LAPAROSCOPIC INGUINAL HERNIA WITH UMBILICAL HERNIA Left [id number]   Procedure: LAPAROSCOPIC LEFT INGUINAL HERNIA  AND UMBILICAL HERNIA REPAIRS WITH MESH;  Surgeon: Ralene Ok, MD;  Location: [place];  Service: General;  Laterality: Left;  . PROSTATE BIOPSY     x 2, once abnormal, once normal  .  TONSILLECTOMY      There were no vitals filed for this visit.  Subjective Assessment - 03/19/19 1104    Subjective  Doing well overall. "The only issue is getting into bed, I may have to do it the way you showed me"    Currently in Pain?  No/denies                       [facility] Adult PT Treatment/Exercise - 03/19/19 0001      Lumbar Exercises: Aerobic   Recumbent Bike  L1 x 4 min     Nustep  L5 x 6 min       Lumbar Exercises: Machines for Strengthening   Cybex Lumbar Extension  black band 2x15     Cybex Knee Extension  10lb 3x10    Cybex Knee Flexion  35lb 2x15    Other Lumbar Machine Exercise  Shoulder Ext 10lb 2x15     Other Lumbar Machine Exercise  Row & lats 35lb 2x15      Lumbar Exercises: Standing   Other Standing Lumbar Exercises  Straight arm pull downs 35lb 2x10,    Other Standing Lumbar Exercises  Overhead back ext yellow ball 2x10  Lumbar Exercises: Seated   Other Seated Lumbar Exercises  ab sets with pball 2x10                PT Short Term Goals - 02/05/19 1511      PT SHORT TERM GOAL #1   Title  independent with initial HEP    Status  Achieved        PT Long Term Goals - 03/13/19 1053      PT LONG TERM GOAL #1   Title  understand body mechanics            Plan - 03/19/19 1142    Clinical Impression Statement  Went over log roll again, pt reports that he has been using his method of getting into bed twisting  his back at times. Good strength with the exercises. Forward shoulders and rounded posture.    Stability/Clinical Decision Making  Stable/Uncomplicated    Rehab Potential  Good    PT Frequency  2x / week    PT Duration  8 weeks    PT Treatment/Interventions  ADLs/Self Care Home Management;Cryotherapy;Electrical Stimulation;Moist Heat;Traction;Ultrasound;Therapeutic activities;Therapeutic exercise;Manual techniques;Patient/family education;Passive range of motion;Dry needling    PT Next Visit Plan  gym exercises,  work on movements of the thoracic spine       Patient will benefit from skilled therapeutic intervention in order to improve the following deficits and impairments:  Pain, Improper body mechanics, Postural dysfunction, Increased muscle spasms, Decreased range of motion, Decreased strength, Impaired flexibility  Visit Diagnosis: Pain in thoracic spine  Muscle spasm of back     Problem List Patient Active Problem List   Diagnosis Date Noted  . Right knee meniscal tear 06/01/2015  . Obstructive sleep apnea 02/07/2015  . Thoracic spine pain 03/29/2014  . Tenosynovitis of thumb 03/29/2014  . Cervical radiculopathy due to degenerative joint disease of spine 10/10/2013  . Bursitis of left shoulder 09/23/2013  . Chronic low back pain 12/15/2012  . Chronic cervical pain 06/09/2012  . Hyperlipidemia 07/06/2010  . Essential hypertension 11/03/2009  . INGUINAL HERNIA, LEFT 11/03/2009  . Nonallergic rhinitis 08/24/2009  . VENTRAL HERNIA 05/20/2009  . DIVERTICULOSIS, COLON 05/20/2009  . NEVUS, ATYPICAL 10/26/2008  . GERD 10/01/2006    Scot Jun, PTA 03/19/2019, 11:56 AM  [place], [place], [address] Phone: [phone]   Fax:  [phone]  Name: Todd Larson MRN: [id number] Date of Birth: 06/06/1940

## 2019-03-23 ENCOUNTER — Ambulatory Visit: Payer: Medicare Other | Admitting: Physical Therapy

## 2019-03-23 ENCOUNTER — Other Ambulatory Visit: Payer: Self-pay

## 2019-03-23 ENCOUNTER — Encounter: Payer: Self-pay | Admitting: Physical Therapy

## 2019-03-23 DIAGNOSIS — M546 Pain in thoracic spine: Secondary | ICD-10-CM | POA: Diagnosis not present

## 2019-03-23 DIAGNOSIS — M62830 Muscle spasm of back: Secondary | ICD-10-CM | POA: Diagnosis not present

## 2019-03-23 NOTE — Therapy (Signed)
[place], [place], [address] Phone: [phone]   Fax:  [phone]  Physical Therapy Treatment  Patient Details  Name: Todd Larson MRN: [id number] Date of Birth: 09-Jan-1940 Referring Provider (PT): Lynne Leader   Encounter Date: 03/23/2019  PT End of Session - 03/23/19 1141    Visit Number  16    Date for PT Re-Evaluation  03/27/19    PT Start Time  1100    PT Stop Time  1143    PT Time Calculation (min)  43 min    Activity Tolerance  Patient tolerated treatment well       Past Medical History:  Diagnosis Date  . Arthritis    cervical spine - T7-8 & lumbar area - bone spurs up & down the back   . Bulging discs     thoracic spine  . Diverticulosis of colon   . GERD (gastroesophageal reflux disease)    related to intestinal system, - has started Align  4-5 days ago-   . Hyperlipidemia   . Hypertension   . Inguinal hernia   . OSA (obstructive sleep apnea)    saw Dr. Annamaria Boots- 02/2015- pt. remarks that as a result BP med. was changed , pt. has a sleep number bed & he reports the score averages in the 80% range   . PSA elevation    History of   . Recurrent sinus infections    related to fracture nose x2, also reports that he has a deviated septum    Past Surgical History:  Procedure Laterality Date  . COLONOSCOPY  2008   3 internal hemorrhoids, diverticulosis;Dr.Jacobs  . INSERTION OF MESH N/A 11/13/2016   Procedure: INSERTION OF MESH;  Surgeon: Ralene Ok, MD;  Location: [place];  Service: General;  Laterality: N/A;  . LAPAROSCOPIC INGUINAL HERNIA WITH UMBILICAL HERNIA Left [id number]   Procedure: LAPAROSCOPIC LEFT INGUINAL HERNIA  AND UMBILICAL HERNIA REPAIRS WITH MESH;  Surgeon: Ralene Ok, MD;  Location: [place];  Service: General;  Laterality: Left;  . PROSTATE BIOPSY     x 2, once abnormal, once normal  . TONSILLECTOMY      There were no vitals filed for this  visit.  Subjective Assessment - 03/23/19 1102    Subjective  Pt reports that keeping his knees together when turning in bed has really helped    Currently in Pain?  No/denies                       [facility] Adult PT Treatment/Exercise - 03/23/19 0001      Lumbar Exercises: Aerobic   Stationary Bike  L5 x 2 min each way     Recumbent Bike  L1 x 4 min     Nustep  L5 x 6 min       Lumbar Exercises: Machines for Strengthening   Cybex Lumbar Extension  black band 2x15     Cybex Knee Extension  10lb 3x10    Cybex Knee Flexion  35lb 2x15    Other Lumbar Machine Exercise  Row & lats 35lb 2x15      Lumbar Exercises: Standing   Other Standing Lumbar Exercises  Straight arm pull downs 35lb 2x10,      Lumbar Exercises: Seated   Other Seated Lumbar Exercises  ab sets with pball 2x10     Other Seated Lumbar Exercises  Sit to stand from mat holding 7lb dumbbell  3x10               PT Short Term Goals - 02/05/19 1511      PT SHORT TERM GOAL #1   Title  independent with initial HEP    Status  Achieved        PT Long Term Goals - 03/13/19 1053      PT LONG TERM GOAL #1   Title  understand body mechanics            Plan - 03/23/19 1142    Clinical Impression Statement  Pt is progressing well. He reports less pain getting in bed when keeping his knees together when twisting. Cues to keep core engaged with straight arm pull downs. Good strength overall, progressing well.    Stability/Clinical Decision Making  Stable/Uncomplicated    Rehab Potential  Good    PT Frequency  2x / week    PT Treatment/Interventions  ADLs/Self Care Home Management;Cryotherapy;Electrical Stimulation;Moist Heat;Traction;Ultrasound;Therapeutic activities;Therapeutic exercise;Manual techniques;Patient/family education;Passive range of motion;Dry needling    PT Next Visit Plan  gym exercises, work on movements of the thoracic spine, D/C next visit       Patient will benefit from skilled  therapeutic intervention in order to improve the following deficits and impairments:  Pain, Improper body mechanics, Postural dysfunction, Increased muscle spasms, Decreased range of motion, Decreased strength, Impaired flexibility  Visit Diagnosis: Pain in thoracic spine  Muscle spasm of back     Problem List Patient Active Problem List   Diagnosis Date Noted  . Right knee meniscal tear 06/01/2015  . Obstructive sleep apnea 02/07/2015  . Thoracic spine pain 03/29/2014  . Tenosynovitis of thumb 03/29/2014  . Cervical radiculopathy due to degenerative joint disease of spine 10/10/2013  . Bursitis of left shoulder 09/23/2013  . Chronic low back pain 12/15/2012  . Chronic cervical pain 06/09/2012  . Hyperlipidemia 07/06/2010  . Essential hypertension 11/03/2009  . INGUINAL HERNIA, LEFT 11/03/2009  . Nonallergic rhinitis 08/24/2009  . VENTRAL HERNIA 05/20/2009  . DIVERTICULOSIS, COLON 05/20/2009  . NEVUS, ATYPICAL 10/26/2008  . GERD 10/01/2006    Scot Jun, PTA 03/23/2019, 11:45 AM  [place], [place], [address] Phone: [phone]   Fax:  [phone]  Name: Todd Larson MRN: [id number] Date of Birth: 07-01-1940

## 2019-03-26 ENCOUNTER — Ambulatory Visit: Payer: Medicare Other | Admitting: Physical Therapy

## 2019-03-26 ENCOUNTER — Encounter: Payer: Self-pay | Admitting: Physical Therapy

## 2019-03-26 ENCOUNTER — Other Ambulatory Visit: Payer: Self-pay

## 2019-03-26 DIAGNOSIS — M546 Pain in thoracic spine: Secondary | ICD-10-CM | POA: Diagnosis not present

## 2019-03-26 DIAGNOSIS — M62830 Muscle spasm of back: Secondary | ICD-10-CM | POA: Diagnosis not present

## 2019-03-26 NOTE — Therapy (Signed)
[place], [place], [address] Phone: [phone]   Fax:  [phone]  Physical Therapy Treatment  Patient Details  Name: Todd Larson MRN: [id number] Date of Birth: 1940-03-07 Referring Provider (PT): Lynne Leader   Encounter Date: 03/26/2019  PT End of Session - 03/26/19 1136    Visit Number  17    Date for PT Re-Evaluation  03/27/19    PT Start Time  1058    PT Stop Time  1138    PT Time Calculation (min)  40 min    Activity Tolerance  Patient tolerated treatment well    Behavior During Therapy  [facility] for tasks assessed/performed       Past Medical History:  Diagnosis Date  . Arthritis    cervical spine - T7-8 & lumbar area - bone spurs up & down the back   . Bulging discs     thoracic spine  . Diverticulosis of colon   . GERD (gastroesophageal reflux disease)    related to intestinal system, - has started Align  4-5 days ago-   . Hyperlipidemia   . Hypertension   . Inguinal hernia   . OSA (obstructive sleep apnea)    saw Dr. Annamaria Boots- 02/2015- pt. remarks that as a result BP med. was changed , pt. has a sleep number bed & he reports the score averages in the 80% range   . PSA elevation    History of   . Recurrent sinus infections    related to fracture nose x2, also reports that he has a deviated septum    Past Surgical History:  Procedure Laterality Date  . COLONOSCOPY  2008   3 internal hemorrhoids, diverticulosis;Dr.Jacobs  . INSERTION OF MESH N/A 11/13/2016   Procedure: INSERTION OF MESH;  Surgeon: Ralene Ok, MD;  Location: [place];  Service: General;  Laterality: N/A;  . LAPAROSCOPIC INGUINAL HERNIA WITH UMBILICAL HERNIA Left 58/68/2574   Procedure: LAPAROSCOPIC LEFT INGUINAL HERNIA  AND UMBILICAL HERNIA REPAIRS WITH MESH;  Surgeon: Ralene Ok, MD;  Location: [place];  Service: General;  Laterality: Left;  . PROSTATE BIOPSY     x 2, once abnormal, once normal  .  TONSILLECTOMY      There were no vitals filed for this visit.  Subjective Assessment - 03/26/19 1101    Subjective  Doing well    Pertinent History  GERD, HTN, abdominal hernia with surgical mesh    Patient Stated Goals  have less pain, be able to move easier    Currently in Pain?  No/denies                       [facility] Adult PT Treatment/Exercise - 03/26/19 0001      Lumbar Exercises: Aerobic   Stationary Bike  L5 x 2 min each way     Recumbent Bike  L1 x 4 min     Nustep  L5 x 6 min       Lumbar Exercises: Machines for Strengthening   Cybex Lumbar Extension  black band 2x15     Cybex Knee Extension  10lb 3x10    Cybex Knee Flexion  35lb 2x15    Other Lumbar Machine Exercise  Row & lats 35lb 2x15      Lumbar Exercises: Standing   Other Standing Lumbar Exercises  Straight arm pull downs 35lb 2x10,      Lumbar Exercises: Seated  Other Seated Lumbar Exercises  ab sets with pball 2x10     Other Seated Lumbar Exercises  Sit to stand from mat holding 7lb dumbbell 3x10               PT Short Term Goals - 02/05/19 1511      PT SHORT TERM GOAL #1   Title  independent with initial HEP    Status  Achieved        PT Long Term Goals - 03/26/19 1116      PT LONG TERM GOAL #2   Title  decrease pain 50% with bed mobility    Status  Achieved      PT LONG TERM GOAL #3   Title  increase lumbar ROM 25%    Status  On-going      PT LONG TERM GOAL #4   Title  independent with advance HEP    Status  Achieved            Plan - 03/26/19 1136    Clinical Impression Statement  Most goals mets. His lumbar and cervical ROM is still limited. He is pleased with his current functional level and reports no functional limitations. He reports walking up to a mile everyday for the last two weeks.    Stability/Clinical Decision Making  Stable/Uncomplicated    Rehab Potential  Good    PT Frequency  2x / week    PT Duration  8 weeks    PT Treatment/Interventions   ADLs/Self Care Home Management;Cryotherapy;Electrical Stimulation;Moist Heat;Traction;Ultrasound;Therapeutic activities;Therapeutic exercise;Manual techniques;Patient/family education;Passive range of motion;Dry needling    PT Next Visit Plan  D/C PT       Patient will benefit from skilled therapeutic intervention in order to improve the following deficits and impairments:  Pain, Improper body mechanics, Postural dysfunction, Increased muscle spasms, Decreased range of motion, Decreased strength, Impaired flexibility  Visit Diagnosis: Pain in thoracic spine     Problem List Patient Active Problem List   Diagnosis Date Noted  . Right knee meniscal tear 06/01/2015  . Obstructive sleep apnea 02/07/2015  . Thoracic spine pain 03/29/2014  . Tenosynovitis of thumb 03/29/2014  . Cervical radiculopathy due to degenerative joint disease of spine 10/10/2013  . Bursitis of left shoulder 09/23/2013  . Chronic low back pain 12/15/2012  . Chronic cervical pain 06/09/2012  . Hyperlipidemia 07/06/2010  . Essential hypertension 11/03/2009  . INGUINAL HERNIA, LEFT 11/03/2009  . Nonallergic rhinitis 08/24/2009  . VENTRAL HERNIA 05/20/2009  . DIVERTICULOSIS, COLON 05/20/2009  . NEVUS, ATYPICAL 10/26/2008  . GERD 10/01/2006    Scot Jun, PTA 03/26/2019, 11:39 AM  [address] [place], [place], [address] Phone: [phone]   Fax:  [phone]  PHYSICAL THERAPY DISCHARGE SUMMARY  Visits from Start of Care: 17 Plan: Patient agrees to discharge.  Patient goals were partially met. Patient is being discharged due to meeting the stated rehab goals.  ?????      Name: Todd Larson MRN: [id number] Date of Birth: 02-Dec-1940

## 2019-03-27 DIAGNOSIS — H2513 Age-related nuclear cataract, bilateral: Secondary | ICD-10-CM | POA: Diagnosis not present

## 2019-04-15 ENCOUNTER — Encounter: Payer: Self-pay | Admitting: Adult Health

## 2019-04-16 ENCOUNTER — Other Ambulatory Visit: Payer: Self-pay

## 2019-04-16 DIAGNOSIS — N138 Other obstructive and reflux uropathy: Secondary | ICD-10-CM | POA: Diagnosis not present

## 2019-04-16 DIAGNOSIS — N401 Enlarged prostate with lower urinary tract symptoms: Secondary | ICD-10-CM | POA: Diagnosis not present

## 2019-04-17 ENCOUNTER — Encounter: Payer: Self-pay | Admitting: Adult Health

## 2019-04-17 ENCOUNTER — Ambulatory Visit (INDEPENDENT_AMBULATORY_CARE_PROVIDER_SITE_OTHER): Payer: Medicare Other | Admitting: Adult Health

## 2019-04-17 VITALS — BP 124/70 | Temp 98.0°F | Wt 230.0 lb

## 2019-04-17 DIAGNOSIS — B351 Tinea unguium: Secondary | ICD-10-CM | POA: Diagnosis not present

## 2019-04-17 MED ORDER — TERBINAFINE HCL 250 MG PO TABS: 250.0000 mg | ORAL_TABLET | Freq: Every day | ORAL | 1 refills | Status: DC

## 2019-04-17 NOTE — Progress Notes (Signed)
Subjective:    Patient ID: Todd Larson, male    DOB: May 03, 1940, 79 y.o.   MRN: [id number]  HPI 79 year old male who  has a past medical history of Arthritis, Bulging discs, Diverticulosis of colon, GERD (gastroesophageal reflux disease), Hyperlipidemia, Hypertension, Inguinal hernia, OSA (obstructive sleep apnea), PSA elevation, and Recurrent sinus infections.  He is being seen today for an acute issue of right great toenail " chipping" when it gets caught on his sock. Denies trauma or pain   Review of Systems See HPI   Past Medical History:  Diagnosis Date  . Arthritis    cervical spine - T7-8 & lumbar area - bone spurs up & down the back   . Bulging discs     thoracic spine  . Diverticulosis of colon   . GERD (gastroesophageal reflux disease)    related to intestinal system, - has started Align  4-5 days ago-   . Hyperlipidemia   . Hypertension   . Inguinal hernia   . OSA (obstructive sleep apnea)    saw Dr. Annamaria Boots- 02/2015- pt. remarks that as a result BP med. was changed , pt. has a sleep number bed & he reports the score averages in the 80% range   . PSA elevation    History of   . Recurrent sinus infections    related to fracture nose x2, also reports that he has a deviated septum    Social History   Socioeconomic History  . Marital status: Single    Spouse name: Not on file  . Number of children: 0  . Years of education: Not on file  . Highest education level: Not on file  Occupational History  . Occupation: retired  Tobacco Use  . Smoking status: Never Smoker  . Smokeless tobacco: Never Used  Substance and Sexual Activity  . Alcohol use: Yes    Alcohol/week: 8.0 - 9.0 standard drinks    Types: 8 - 9 Glasses of wine per week    Comment: social/ drinks wine(white)  . Drug use: No  . Sexual activity: Not on file  Other Topics Concern  . 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       Social Determinants of Health   Financial Resource Strain:   . Difficulty of Paying Living Expenses:   Food Insecurity:   . Worried About Charity fundraiser in the Last Year:   . Arboriculturist in the Last Year:   Transportation Needs:   . Film/video editor (Medical):   Marland Kitchen Lack of Transportation (Non-Medical):   Physical Activity:   . Days of Exercise per Week:   . Minutes of Exercise per Session:   Stress:   . Feeling of Stress :   Social Connections:   . Frequency of Communication with Friends and Family:   . Frequency of Social Gatherings with Friends and Family:   . Attends Religious Services:   . Active Member of Clubs or Organizations:   . Attends Archivist Meetings:   Marland Kitchen Marital Status:   Intimate Partner Violence:   . Fear of Current or Ex-Partner:   . Emotionally Abused:   Marland Kitchen Physically Abused:   . Sexually Abused:     Past Surgical History:  Procedure Laterality Date  . COLONOSCOPY  2008   3 internal hemorrhoids, diverticulosis;Dr.Jacobs  . INSERTION OF MESH N/A 11/13/2016   Procedure: INSERTION OF MESH;  Surgeon: Ralene Ok, MD;  Location: [place];  Service: General;  Laterality: N/A;  . LAPAROSCOPIC INGUINAL HERNIA WITH UMBILICAL HERNIA Left [id number]   Procedure: LAPAROSCOPIC LEFT INGUINAL HERNIA  AND UMBILICAL HERNIA REPAIRS WITH MESH;  Surgeon: Ralene Ok, MD;  Location: [place];  Service: General;  Laterality: Left;  . PROSTATE BIOPSY     x 2, once abnormal, once normal  . TONSILLECTOMY      Family History  Problem Relation Age of Onset  . Lung cancer Maternal Grandfather        enviromental   . Heart attack Paternal Uncle   . Prostate cancer Brother     Allergies  Allergen Reactions  . Amoxicillin-Pot Clavulanate Diarrhea and Other (See Comments)    Night sweats, not allergic to Amoxicillin, just can't tolerate augmentin    Current Outpatient Medications on File Prior to Visit  Medication  Sig Dispense Refill  . cyclobenzaprine (FLEXERIL) 5 MG tablet Take 1 tablet (5 mg total) by mouth 3 (three) times daily as needed for muscle spasms. 30 tablet 6  . finasteride (PROSCAR) 5 MG tablet Take 5 mg by mouth daily.    . fluticasone (FLONASE) 50 MCG/ACT nasal spray USE 1 SPRAY(S) IN EACH NOSTRIL TWICE DAILY AS NEEDED FOR RHINITIS 48 g 0  . gabapentin (NEURONTIN) 100 MG capsule Take 2 capsules (200 mg total) by mouth at bedtime. (Patient taking differently: Take 200 mg by mouth at bedtime as needed. ) 60 capsule 1  . hydrochlorothiazide (HYDRODIURIL) 25 MG tablet Take 1 tablet by mouth once daily 90 tablet 3  . losartan (COZAAR) 50 MG tablet Take 1 tablet by mouth once daily 90 tablet 2  . Polyvinyl Alcohol-Povidone (REFRESH OP) Place 1 drop as needed into both eyes (for dry eyes).     No current facility-administered medications on file prior to visit.    BP 124/70   Temp 98 F (36.7 C)   Wt 230 lb (104.3 kg)   BMI 34.72 kg/m       Objective:   Physical Exam Vitals and nursing note reviewed.  Constitutional:      Appearance: Normal appearance.  Skin:    General: Skin is warm and dry.     Comments: Onychomycosis noted to right great toenail. Chipping of the toenail is noted.   Neurological:     General: No focal deficit present.     Mental Status: He is alert and oriented to person, place, and time.  Psychiatric:        Mood and Affect: Mood normal.        Behavior: Behavior normal.        Thought Content: Thought content normal.        Judgment: Judgment normal.       Assessment & Plan:  1. Onychomycosis - terbinafine (LAMISIL) 250 MG tablet; Take 1 tablet (250 mg total) by mouth daily.  Dispense: 30 tablet; Refill: 1   Dorothyann Peng, NP

## 2019-04-17 NOTE — Patient Instructions (Signed)
I have sent in Lamisil oral medication for the toenail fungus.   Take this daily for two months

## 2019-04-28 DIAGNOSIS — M9901 Segmental and somatic dysfunction of cervical region: Secondary | ICD-10-CM | POA: Diagnosis not present

## 2019-04-28 DIAGNOSIS — M5136 Other intervertebral disc degeneration, lumbar region: Secondary | ICD-10-CM | POA: Diagnosis not present

## 2019-04-28 DIAGNOSIS — M9903 Segmental and somatic dysfunction of lumbar region: Secondary | ICD-10-CM | POA: Diagnosis not present

## 2019-04-28 DIAGNOSIS — M5134 Other intervertebral disc degeneration, thoracic region: Secondary | ICD-10-CM | POA: Diagnosis not present

## 2019-04-28 DIAGNOSIS — M9902 Segmental and somatic dysfunction of thoracic region: Secondary | ICD-10-CM | POA: Diagnosis not present

## 2019-04-28 DIAGNOSIS — M5031 Other cervical disc degeneration,  high cervical region: Secondary | ICD-10-CM | POA: Diagnosis not present

## 2019-05-15 ENCOUNTER — Encounter: Payer: Self-pay | Admitting: Adult Health

## 2019-05-19 DIAGNOSIS — M5136 Other intervertebral disc degeneration, lumbar region: Secondary | ICD-10-CM | POA: Diagnosis not present

## 2019-05-19 DIAGNOSIS — M9902 Segmental and somatic dysfunction of thoracic region: Secondary | ICD-10-CM | POA: Diagnosis not present

## 2019-05-19 DIAGNOSIS — M9901 Segmental and somatic dysfunction of cervical region: Secondary | ICD-10-CM | POA: Diagnosis not present

## 2019-05-19 DIAGNOSIS — M9903 Segmental and somatic dysfunction of lumbar region: Secondary | ICD-10-CM | POA: Diagnosis not present

## 2019-05-19 DIAGNOSIS — M5134 Other intervertebral disc degeneration, thoracic region: Secondary | ICD-10-CM | POA: Diagnosis not present

## 2019-05-19 DIAGNOSIS — M5031 Other cervical disc degeneration,  high cervical region: Secondary | ICD-10-CM | POA: Diagnosis not present

## 2019-07-13 ENCOUNTER — Other Ambulatory Visit: Payer: Self-pay | Admitting: Adult Health

## 2019-07-13 DIAGNOSIS — I10 Essential (primary) hypertension: Secondary | ICD-10-CM

## 2019-07-14 ENCOUNTER — Encounter: Payer: Self-pay | Admitting: Adult Health

## 2019-07-14 NOTE — Telephone Encounter (Signed)
Sent to the pharmacy by e-scribe for 90 days.  Pt has upcoming cpx. 

## 2019-07-21 ENCOUNTER — Encounter: Payer: Self-pay | Admitting: Adult Health

## 2019-07-22 ENCOUNTER — Ambulatory Visit (INDEPENDENT_AMBULATORY_CARE_PROVIDER_SITE_OTHER): Payer: Medicare Other | Admitting: Adult Health

## 2019-07-22 ENCOUNTER — Encounter: Payer: Self-pay | Admitting: Adult Health

## 2019-07-22 ENCOUNTER — Other Ambulatory Visit: Payer: Self-pay | Admitting: Adult Health

## 2019-07-22 ENCOUNTER — Other Ambulatory Visit: Payer: Self-pay

## 2019-07-22 VITALS — BP 118/70 | Temp 99.2°F | Ht 68.0 in | Wt 227.0 lb

## 2019-07-22 DIAGNOSIS — E782 Mixed hyperlipidemia: Secondary | ICD-10-CM | POA: Diagnosis not present

## 2019-07-22 DIAGNOSIS — J309 Allergic rhinitis, unspecified: Secondary | ICD-10-CM

## 2019-07-22 DIAGNOSIS — I10 Essential (primary) hypertension: Secondary | ICD-10-CM | POA: Diagnosis not present

## 2019-07-22 DIAGNOSIS — N401 Enlarged prostate with lower urinary tract symptoms: Secondary | ICD-10-CM | POA: Diagnosis not present

## 2019-07-22 DIAGNOSIS — E7439 Other disorders of intestinal carbohydrate absorption: Secondary | ICD-10-CM | POA: Diagnosis not present

## 2019-07-22 MED ORDER — FLUTICASONE PROPIONATE 50 MCG/ACT NA SUSP: NASAL | 3 refills | Status: DC

## 2019-07-22 NOTE — Progress Notes (Signed)
Subjective:    Patient ID: Todd Larson, male    DOB: 01/10/1940, 79 y.o.   MRN: [id number]  HPI   Patient presents for yearly preventative medicine examination. He is a pleasant 79 year old male who  has a past medical history of Arthritis, Bulging discs, Diverticulosis of colon, GERD (gastroesophageal reflux disease), Hyperlipidemia, Hypertension, Inguinal hernia, OSA (obstructive sleep apnea), PSA elevation, and Recurrent sinus infections.  BPH -takes Proscar 5 mg daily.  Feels well controlled with this medication.  He is managed by urology  Essential hypertension-takes HCTZ 25 mg and losartan 50 mg daily.  He denies chest pain, shortness of breath, syncopal episodes, headaches, or dizziness.  BP Readings from Last 3 Encounters:  07/22/19 118/70  04/17/19 124/70  01/13/19 124/74   Hyperlipidemia-not currently on any medications as this is diet controlled Lab Results  Component Value Date   CHOL 155 07/16/2018   HDL 41.20 07/16/2018   LDLCALC 99 07/16/2018   TRIG 76.0 07/16/2018   CHOLHDL 4 07/16/2018   Glucose Intolerance - fasting glucose runs elevated  Lab Results  Component Value Date   HGBA1C 5.3 04/10/2017    All immunizations and health maintenance protocols were reviewed with the patient and needed orders were placed.  Appropriate screening laboratory values were ordered for the patient including screening of hyperlipidemia, renal function and hepatic function. If indicated by BPH, a PSA was ordered.  Medication reconciliation,  past medical history, social history, problem list and allergies were reviewed in detail with the patient  Goals were established with regard to weight loss, exercise, and  diet in compliance with medications  Wt Readings from Last 3 Encounters:  07/22/19 227 lb (103 kg)  04/17/19 230 lb (104.3 kg)  02/25/19 236 lb (107 kg)   Review of Systems  Constitutional: Negative.   HENT: Negative.   Eyes: Negative.   Respiratory: Negative.    Cardiovascular: Negative.   Gastrointestinal: Negative.   Endocrine: Negative.   Genitourinary: Negative.   Musculoskeletal: Negative.   Skin: Negative.   Allergic/Immunologic: Negative.   Neurological: Negative.   Hematological: Negative.   Psychiatric/Behavioral: Negative.   All other systems reviewed and are negative.    Past Medical History:  Diagnosis Date  . Arthritis    cervical spine - T7-8 & lumbar area - bone spurs up & down the back   . Bulging discs     thoracic spine  . Diverticulosis of colon   . GERD (gastroesophageal reflux disease)    related to intestinal system, - has started Align  4-5 days ago-   . Hyperlipidemia   . Hypertension   . Inguinal hernia   . OSA (obstructive sleep apnea)    saw Dr. Annamaria Boots- 02/2015- pt. remarks that as a result BP med. was changed , pt. has a sleep number bed & he reports the score averages in the 80% range   . PSA elevation    History of   . Recurrent sinus infections    related to fracture nose x2, also reports that he has a deviated septum    Social History   Socioeconomic History  . Marital status: Single    Spouse name: Not on file  . Number of children: 0  . Years of education: Not on file  . Highest education level: Not on file  Occupational History  . Occupation: retired  Tobacco Use  . Smoking status: Never Smoker  . Smokeless tobacco: Never Used  Vaping Use  .  Vaping Use: Never used  Substance and Sexual Activity  . Alcohol use: Yes    Alcohol/week: 8.0 - 9.0 standard drinks    Types: 8 - 9 Glasses of wine per week    Comment: social/ drinks wine(white)  . Drug use: No  . Sexual activity: Not on file  Other Topics Concern  . 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       Social Determinants of Health   Financial Resource Strain:   . Difficulty of Paying Living Expenses:   Food Insecurity:   .  Worried About Charity fundraiser in the Last Year:   . Arboriculturist in the Last Year:   Transportation Needs:   . Film/video editor (Medical):   Marland Kitchen Lack of Transportation (Non-Medical):   Physical Activity:   . Days of Exercise per Week:   . Minutes of Exercise per Session:   Stress:   . Feeling of Stress :   Social Connections:   . Frequency of Communication with Friends and Family:   . Frequency of Social Gatherings with Friends and Family:   . Attends Religious Services:   . Active Member of Clubs or Organizations:   . Attends Archivist Meetings:   Marland Kitchen Marital Status:   Intimate Partner Violence:   . Fear of Current or Ex-Partner:   . Emotionally Abused:   Marland Kitchen Physically Abused:   . Sexually Abused:     Past Surgical History:  Procedure Laterality Date  . COLONOSCOPY  2008   3 internal hemorrhoids, diverticulosis;Dr.Jacobs  . INSERTION OF MESH N/A 11/13/2016   Procedure: INSERTION OF MESH;  Surgeon: Ralene Ok, MD;  Location: [place];  Service: General;  Laterality: N/A;  . LAPAROSCOPIC INGUINAL HERNIA WITH UMBILICAL HERNIA Left 41/32/4401   Procedure: LAPAROSCOPIC LEFT INGUINAL HERNIA  AND UMBILICAL HERNIA REPAIRS WITH MESH;  Surgeon: Ralene Ok, MD;  Location: [place];  Service: General;  Laterality: Left;  . PROSTATE BIOPSY     x 2, once abnormal, once normal  . TONSILLECTOMY      Family History  Problem Relation Age of Onset  . Lung cancer Maternal Grandfather        enviromental   . Heart attack Paternal Uncle   . Prostate cancer Brother     Allergies  Allergen Reactions  . Amoxicillin-Pot Clavulanate Diarrhea and Other (See Comments)    Night sweats, not allergic to Amoxicillin, just can't tolerate augmentin    Current Outpatient Medications on File Prior to Visit  Medication Sig Dispense Refill  . finasteride (PROSCAR) 5 MG tablet Take 5 mg by mouth daily.    . fluticasone (FLONASE) 50 MCG/ACT nasal spray USE 1 SPRAY(S) IN EACH  NOSTRIL TWICE DAILY AS NEEDED FOR RHINITIS 48 g 0  . hydrochlorothiazide (HYDRODIURIL) 25 MG tablet Take 1 tablet by mouth once daily 90 tablet 0  . losartan (COZAAR) 50 MG tablet Take 1 tablet by mouth once daily 90 tablet 2  . Polyvinyl Alcohol-Povidone (REFRESH OP) Place 1 drop as needed into both eyes (for dry eyes).    . cyclobenzaprine (FLEXERIL) 5 MG tablet Take 1 tablet (5 mg total) by mouth 3 (three) times daily as needed for muscle spasms. (Patient not taking: Reported on 07/22/2019) 30 tablet 6  . gabapentin (NEURONTIN) 100 MG capsule  Take 2 capsules (200 mg total) by mouth at bedtime. (Patient not taking: Reported on 07/22/2019) 60 capsule 1   No current facility-administered medications on file prior to visit.    BP 118/70   Temp 99.2 F (37.3 C)   Ht '5\' 8"'$  (1.727 m)   Wt 227 lb (103 kg)   BMI 34.52 kg/m       Objective:   Physical Exam Vitals and nursing note reviewed.  Constitutional:      General: He is not in acute distress.    Appearance: Normal appearance. He is well-developed. He is obese.  HENT:     Head: Normocephalic and atraumatic.     Right Ear: Tympanic membrane, ear canal and external ear normal. There is no impacted cerumen.     Left Ear: Tympanic membrane, ear canal and external ear normal. There is no impacted cerumen.     Nose: Nose normal. No congestion or rhinorrhea.     Mouth/Throat:     Mouth: Mucous membranes are moist.     Pharynx: Oropharynx is clear. No oropharyngeal exudate or posterior oropharyngeal erythema.  Eyes:     General:        Right eye: No discharge.        Left eye: No discharge.     Extraocular Movements: Extraocular movements intact.     Conjunctiva/sclera: Conjunctivae normal.     Pupils: Pupils are equal, round, and reactive to light.  Neck:     Vascular: No carotid bruit.     Trachea: No tracheal deviation.  Cardiovascular:     Rate and Rhythm: Normal rate and regular rhythm.     Pulses: Normal pulses.     Heart  sounds: Normal heart sounds. No murmur heard.  No friction rub. No gallop.   Pulmonary:     Effort: Pulmonary effort is normal. No respiratory distress.     Breath sounds: Normal breath sounds. No stridor. No wheezing, rhonchi or rales.  Chest:     Chest wall: No tenderness.  Abdominal:     General: Bowel sounds are normal. There is no distension.     Palpations: Abdomen is soft. There is no mass.     Tenderness: There is no abdominal tenderness. There is no right CVA tenderness, left CVA tenderness, guarding or rebound.     Hernia: No hernia is present.  Musculoskeletal:        General: No swelling, tenderness, deformity or signs of injury. Normal range of motion.     Right lower leg: No edema.     Left lower leg: No edema.  Lymphadenopathy:     Cervical: No cervical adenopathy.  Skin:    General: Skin is warm and dry.     Capillary Refill: Capillary refill takes less than 2 seconds.     Coloration: Skin is not jaundiced or pale.     Findings: No bruising, erythema, lesion or rash.  Neurological:     General: No focal deficit present.     Mental Status: He is alert and oriented to person, place, and time.     Cranial Nerves: No cranial nerve deficit.     Sensory: No sensory deficit.     Motor: No weakness.     Coordination: Coordination normal.     Gait: Gait normal.     Deep Tendon Reflexes: Reflexes normal.  Psychiatric:        Mood and Affect: Mood normal.        Behavior: Behavior  normal.        Thought Content: Thought content normal.        Judgment: Judgment normal.       Assessment & Plan:  1. Essential hypertension - No change in medications  - Follow up in one year or sooner if needed - CBC with Differential/Platelet; Future - Lipid panel; Future - TSH; Future - CMP with eGFR([facility]); Future - Hemoglobin A1c; Future - Magnesium; Future  2. Mixed hyperlipidemia - Consider statin  - CBC with Differential/Platelet; Future - Lipid panel; Future - TSH;  Future - CMP with eGFR([facility]); Future - Hemoglobin A1c; Future - Magnesium; Future  3. Benign prostatic hyperplasia with lower urinary tract symptoms, symptom details unspecified - Follow up with Urology as directed  4. Glucose intolerance  - CMP with eGFR([facility]); Future - Hemoglobin A1c; Future  [employer]

## 2019-07-23 LAB — CBC WITH DIFFERENTIAL/PLATELET
Absolute Monocytes: 648 cells/uL (ref 200–950)
Basophils Absolute: 111 cells/uL (ref 0–200)
Basophils Relative: 1.4 %
Eosinophils Absolute: 166 cells/uL (ref 15–500)
Eosinophils Relative: 2.1 %
HCT: 52.6 % — ABNORMAL HIGH (ref 38.5–50.0)
Hemoglobin: 17.8 g/dL — ABNORMAL HIGH (ref 13.2–17.1)
Lymphs Abs: 1754 cells/uL (ref 850–3900)
MCH: 31.3 pg (ref 27.0–33.0)
MCHC: 33.8 g/dL (ref 32.0–36.0)
MCV: 92.4 fL (ref 80.0–100.0)
MPV: 9.5 fL (ref 7.5–12.5)
Monocytes Relative: 8.2 %
Neutro Abs: 5222 cells/uL (ref 1500–7800)
Neutrophils Relative %: 66.1 %
Platelets: 304 10*3/uL (ref 140–400)
RBC: 5.69 10*6/uL (ref 4.20–5.80)
RDW: 12.2 % (ref 11.0–15.0)
Total Lymphocyte: 22.2 %
WBC: 7.9 10*3/uL (ref 3.8–10.8)

## 2019-07-23 LAB — MAGNESIUM: Magnesium: 2.2 mg/dL (ref 1.5–2.5)

## 2019-07-23 LAB — COMPLETE METABOLIC PANEL WITH GFR
AG Ratio: 1.8 (calc) (ref 1.0–2.5)
ALT: 20 U/L (ref 9–46)
AST: 17 U/L (ref 10–35)
Albumin: 4.3 g/dL (ref 3.6–5.1)
Alkaline phosphatase (APISO): 55 U/L (ref 35–144)
BUN: 14 mg/dL (ref 7–25)
CO2: 30 mmol/L (ref 20–32)
Calcium: 9.3 mg/dL (ref 8.6–10.3)
Chloride: 102 mmol/L (ref 98–110)
Creat: 0.93 mg/dL (ref 0.70–1.18)
GFR, Est African American: 91 mL/min/{1.73_m2} (ref >=60)
GFR, Est Non African American: 79 mL/min/{1.73_m2} (ref >=60)
Globulin: 2.4 g/dL (calc) (ref 1.9–3.7)
Glucose, Bld: 114 mg/dL — ABNORMAL HIGH (ref 65–99)
Potassium: 4.3 mmol/L (ref 3.5–5.3)
Sodium: 140 mmol/L (ref 135–146)
Total Bilirubin: 1.4 mg/dL — ABNORMAL HIGH (ref 0.2–1.2)
Total Protein: 6.7 g/dL (ref 6.1–8.1)

## 2019-07-23 LAB — LIPID PANEL
Cholesterol: 157 mg/dL (ref <200)
HDL: 48 mg/dL (ref >=40)
LDL Cholesterol (Calc): 94 mg/dL (calc)
Non-HDL Cholesterol (Calc): 109 mg/dL (calc) (ref <130)
Total CHOL/HDL Ratio: 3.3 (calc) (ref <5.0)
Triglycerides: 66 mg/dL (ref <150)

## 2019-07-23 LAB — TSH: TSH: 2.33 mIU/L (ref 0.40–4.50)

## 2019-07-23 LAB — HEMOGLOBIN A1C
Hgb A1c MFr Bld: 5.1 % of total Hgb (ref <5.7)
Mean Plasma Glucose: 100 (calc)
eAG (mmol/L): 5.5 (calc)

## 2019-08-04 ENCOUNTER — Telehealth: Payer: Self-pay

## 2019-08-04 NOTE — Telephone Encounter (Signed)
Noted.  Information given to [place].  She will remove patient from [facility].

## 2019-08-04 NOTE — Telephone Encounter (Signed)
-----   Message from Milus Banister, MD sent at 08/04/2019  7:34 AM EDT ----- Regarding: RE: Cologuard Screening That is true, no further colon cancer screening is necessary at his age.  Thanks ----- Message ----- From: Stevan Born, CMA Sent: 08/03/2019   1:50 PM EDT To: Milus Banister, MD Subject: Cologuard Screening                            Dr Ardis Hughs,  I saw the note from July 2018 where no further cologuard testing is needed after the age of 61.  I just wanted to clarify and make sure there is no need for further testing for this patient.   Please advise. Thank you, Elmyra Ricks

## 2019-09-24 ENCOUNTER — Other Ambulatory Visit: Payer: Self-pay | Admitting: Adult Health

## 2019-09-24 DIAGNOSIS — I10 Essential (primary) hypertension: Secondary | ICD-10-CM

## 2019-09-28 ENCOUNTER — Ambulatory Visit (INDEPENDENT_AMBULATORY_CARE_PROVIDER_SITE_OTHER): Payer: Medicare Other | Admitting: *Deleted

## 2019-09-28 ENCOUNTER — Other Ambulatory Visit: Payer: Self-pay

## 2019-09-28 DIAGNOSIS — Z23 Encounter for immunization: Secondary | ICD-10-CM | POA: Diagnosis not present

## 2019-09-30 DIAGNOSIS — Z23 Encounter for immunization: Secondary | ICD-10-CM | POA: Diagnosis not present

## 2019-10-08 ENCOUNTER — Other Ambulatory Visit: Payer: Self-pay | Admitting: Adult Health

## 2019-10-08 DIAGNOSIS — I10 Essential (primary) hypertension: Secondary | ICD-10-CM

## 2019-10-14 DIAGNOSIS — L821 Other seborrheic keratosis: Secondary | ICD-10-CM | POA: Diagnosis not present

## 2019-10-14 DIAGNOSIS — Z1283 Encounter for screening for malignant neoplasm of skin: Secondary | ICD-10-CM | POA: Diagnosis not present

## 2019-10-14 DIAGNOSIS — L603 Nail dystrophy: Secondary | ICD-10-CM | POA: Diagnosis not present

## 2019-10-15 DIAGNOSIS — N401 Enlarged prostate with lower urinary tract symptoms: Secondary | ICD-10-CM | POA: Diagnosis not present

## 2019-10-15 DIAGNOSIS — N138 Other obstructive and reflux uropathy: Secondary | ICD-10-CM | POA: Diagnosis not present

## 2019-10-15 DIAGNOSIS — R3589 Other polyuria: Secondary | ICD-10-CM | POA: Diagnosis not present

## 2019-11-10 ENCOUNTER — Other Ambulatory Visit: Payer: Self-pay

## 2019-11-11 ENCOUNTER — Encounter: Payer: Self-pay | Admitting: Adult Health

## 2019-11-11 ENCOUNTER — Ambulatory Visit (INDEPENDENT_AMBULATORY_CARE_PROVIDER_SITE_OTHER): Payer: Medicare Other | Admitting: Adult Health

## 2019-11-11 VITALS — BP 120/68 | HR 94 | Temp 98.1°F | Ht 68.0 in | Wt 229.4 lb

## 2019-11-11 DIAGNOSIS — M779 Enthesopathy, unspecified: Secondary | ICD-10-CM

## 2019-11-11 MED ORDER — IBUPROFEN 600 MG PO TABS: 600.0000 mg | ORAL_TABLET | Freq: Three times a day (TID) | ORAL | 0 refills | Status: DC | PRN

## 2019-11-11 NOTE — Progress Notes (Signed)
Subjective:    Patient ID: Todd Larson, male    DOB: [birth year], 79 y.o.   MRN: [id number]  HPI 80 year old male who  has a past medical history of Arthritis, Bulging discs, Diverticulosis of colon, GERD (gastroesophageal reflux disease), Hyperlipidemia, Hypertension, Inguinal hernia, OSA (obstructive sleep apnea), PSA elevation, and Recurrent sinus infections.  Presents to the office today for an acute issue of left-sided shoulder pain.  Pain is felt as a "soreness" that has been present for roughly 2 months on and off.  He believes that either being worked on by his chiropractor, pushing up on chairs when going from a sitting to a standing position, or pushing up to get out of bed may have been aggravating factor(s).  He has not been using anything over-the-counter.  He denies loss of range of motion or grip strength.  Has not had any numbness or tingling down the left arm.  Pain is felt with certain movements such as when wiping down the shower door is in a circular motion or the push-up motion.   Review of Systems See HPI   Past Medical History:  Diagnosis Date   Arthritis    cervical spine - T7-8 & lumbar area - bone spurs up & down the back    Bulging discs     thoracic spine   Diverticulosis of colon    GERD (gastroesophageal reflux disease)    related to intestinal system, - has started Align  4-5 days ago-    Hyperlipidemia    Hypertension    Inguinal hernia    OSA (obstructive sleep apnea)    saw Dr. Annamaria Boots- 02/2015- pt. remarks that as a result BP med. was changed , pt. has a sleep number bed & he reports the score averages in the 80% range    PSA elevation    History of    Recurrent sinus infections    related to fracture nose x2, also reports that he has a deviated septum    Social History   Socioeconomic History   Marital status: Single    Spouse name: Not on file   Number of children: 0   Years of education: Not on file   Highest education level:  Not on file  Occupational History   Occupation: retired  Tobacco Use   Smoking status: Never Smoker   Smokeless tobacco: Never Used  Scientific laboratory technician Use: Never used  Substance and Sexual Activity   Alcohol use: Yes    Alcohol/week: 8.0 - 9.0 standard drinks    Types: 8 - 9 Glasses of wine per week    Comment: social/ drinks wine(white)   Drug use: No   Sexual activity: Not on file  Other Topics Concern   Not on file  Social History Narrative   Retired    Single    No kids       He likes to read, volunteers with an animal rescue group.       Social Determinants of Health   Financial Resource Strain:    Difficulty of Paying Living Expenses: Not on file  Food Insecurity:    Worried About Charity fundraiser in the Last Year: Not on file   [employer] of Food in the Last Year: Not on file  Transportation Needs:    Lack of Transportation (Medical): Not on file   Lack of Transportation (Non-Medical): Not on file  Physical Activity:    Days of Exercise  per Week: Not on file   Minutes of Exercise per Session: Not on file  Stress:    Feeling of Stress : Not on file  Social Connections:    Frequency of Communication with Friends and Family: Not on file   Frequency of Social Gatherings with Friends and Family: Not on file   Attends Religious Services: Not on file   Active Member of [place] or Organizations: Not on file   Attends Archivist Meetings: Not on file   Marital Status: Not on file  Intimate Partner Violence:    Fear of Current or Ex-Partner: Not on file   Emotionally Abused: Not on file   Physically Abused: Not on file   Sexually Abused: Not on file    Past Surgical History:  Procedure Laterality Date   COLONOSCOPY  2008   3 internal hemorrhoids, diverticulosis;Dr.Jacobs   INSERTION OF MESH N/A 11/13/2016   Procedure: INSERTION OF MESH;  Surgeon: Ralene Ok, MD;  Location: [facility];  Service: General;  Laterality: N/A;    LAPAROSCOPIC INGUINAL HERNIA WITH UMBILICAL HERNIA Left 29/51/8841   Procedure: LAPAROSCOPIC LEFT INGUINAL HERNIA  AND UMBILICAL HERNIA REPAIRS WITH MESH;  Surgeon: Ralene Ok, MD;  Location: [facility];  Service: General;  Laterality: Left;   PROSTATE BIOPSY     x 2, once abnormal, once normal   TONSILLECTOMY      Family History  Problem Relation Age of Onset   Lung cancer Maternal Grandfather        enviromental    Heart attack Paternal Uncle    Prostate cancer Brother     Allergies  Allergen Reactions   Amoxicillin-Pot Clavulanate Diarrhea and Other (See Comments)    Night sweats, not allergic to Amoxicillin, just can't tolerate augmentin    Current Outpatient Medications on File Prior to Visit  Medication Sig Dispense Refill   finasteride (PROSCAR) 5 MG tablet Take 5 mg by mouth daily.     fluticasone (FLONASE) 50 MCG/ACT nasal spray USE 1 SPRAY(S) IN EACH NOSTRIL TWICE DAILY AS NEEDED FOR RHINITIS 48 g 3   hydrochlorothiazide (HYDRODIURIL) 25 MG tablet Take 1 tablet by mouth once daily 90 tablet 0   losartan (COZAAR) 50 MG tablet Take 1 tablet by mouth once daily 90 tablet 0   Polyvinyl Alcohol-Povidone (REFRESH OP) Place 1 drop as needed into both eyes (for dry eyes).     No current facility-administered medications on file prior to visit.    BP 120/68    Pulse 94    Temp 98.1 F (36.7 C) (Tympanic)    Ht 5\' 8"  (1.727 m)    Wt 229 lb 6.4 oz (104.1 kg)    SpO2 96%    BMI 34.88 kg/m       Objective:   Physical Exam Vitals and nursing note reviewed.  Constitutional:      Appearance: Normal appearance.  Musculoskeletal:        General: Tenderness present. No deformity.     Right shoulder: Normal.     Left shoulder: Tenderness present. No bony tenderness or crepitus. Normal range of motion. Normal strength. Normal pulse.     Comments: Does have pain with deep palpation along supraspinatus muscle tendon as well as  Supra scapularis muscle tendon     Skin:    General: Skin is warm and dry.     Capillary Refill: Capillary refill takes less than 2 seconds.  Neurological:     General: No focal deficit present.  Mental Status: He is alert and oriented to person, place, and time.  Psychiatric:        Mood and Affect: Mood normal.        Behavior: Behavior normal.        Thought Content: Thought content normal.        Judgment: Judgment normal.       Assessment & Plan:  1. Tendonitis -Not concerned for rotator cuff injury as he has absolutely no decreased range of motion in the left shoulder.  Advised ibuprofen 600 mg every 8 hours for the next 3 to 4 days, rest, and heat.  Follow-up if no improvement and can consider imaging at that point in time - ibuprofen (ADVIL) 600 MG tablet; Take 1 tablet (600 mg total) by mouth every 8 (eight) hours as needed.  Dispense: 30 tablet; Refill: 0   Dorothyann Peng, NP

## 2019-11-12 ENCOUNTER — Encounter: Payer: Self-pay | Admitting: Adult Health

## 2019-11-24 ENCOUNTER — Other Ambulatory Visit: Payer: Self-pay

## 2019-11-24 ENCOUNTER — Ambulatory Visit (INDEPENDENT_AMBULATORY_CARE_PROVIDER_SITE_OTHER): Payer: Medicare Other

## 2019-11-24 VITALS — BP 130/68 | HR 98 | Temp 98.6°F | Resp 20 | Wt 228.5 lb

## 2019-11-24 DIAGNOSIS — Z0000 Encounter for general adult medical examination without abnormal findings: Secondary | ICD-10-CM | POA: Diagnosis not present

## 2019-11-24 NOTE — Patient Instructions (Addendum)
Todd Larson , Thank you for taking time to come for your Medicare Wellness Visit. I appreciate your ongoing commitment to your health goals. Please review the following plan we discussed and let me know if I can assist you in the future.   Screening recommendations/referrals: Colonoscopy: No longer required  Recommended yearly ophthalmology/optometry visit for glaucoma screening and checkup Recommended yearly dental visit for hygiene and checkup  Vaccinations: Influenza vaccine: Done 09/28/19 Up to date Pneumococcal vaccine: Up to date Shingles vaccine: Shingrix discussed. Please contact your pharmacy for coverage information.    Covid-19: Completed 1/20, 2/10, & 09/30/19  Advanced directives: Advance directive discussed with you today. Even though you declined this today please call our office should you change your mind and we can give you the proper paperwork for you to fill out.  Conditions/risks identified: Lose weight   Next appointment: Follow up in one year for your annual wellness visit.   Preventive Care 22 Years and Older, Male Preventive care refers to lifestyle choices and visits with your health care provider that can promote health and wellness. What does preventive care include?  A yearly physical exam. This is also called an annual well check.  Dental exams once or twice a year.  Routine eye exams. Ask your health care provider how often you should have your eyes checked.  Personal lifestyle choices, including:  Daily care of your teeth and gums.  Regular physical activity.  Eating a healthy diet.  Avoiding tobacco and drug use.  Limiting alcohol use.  Practicing safe sex.  Taking low doses of aspirin every day.  Taking vitamin and mineral supplements as recommended by your health care provider. What happens during an annual well check? The services and screenings done by your health care provider during your annual well check will depend on your age,  overall health, lifestyle risk factors, and family history of disease. Counseling  Your health care provider may ask you questions about your:  Alcohol use.  Tobacco use.  Drug use.  Emotional well-being.  Home and relationship well-being.  Sexual activity.  Eating habits.  History of falls.  Memory and ability to understand (cognition).  Work and work Statistician. Screening  You may have the following tests or measurements:  Height, weight, and BMI.  Blood pressure.  Lipid and cholesterol levels. These may be checked every 5 years, or more frequently if you are over 22 years old.  Skin check.  Lung cancer screening. You may have this screening every year starting at age 81 if you have a 30-pack-year history of smoking and currently smoke or have quit within the past 15 years.  Fecal occult blood test (FOBT) of the stool. You may have this test every year starting at age 61.  Flexible sigmoidoscopy or colonoscopy. You may have a sigmoidoscopy every 5 years or a colonoscopy every 10 years starting at age 32.  Prostate cancer screening. Recommendations will vary depending on your family history and other risks.  Hepatitis C blood test.  Hepatitis B blood test.  Sexually transmitted disease (STD) testing.  Diabetes screening. This is done by checking your blood sugar (glucose) after you have not eaten for a while (fasting). You may have this done every 1-3 years.  Abdominal aortic aneurysm (AAA) screening. You may need this if you are a current or former smoker.  Osteoporosis. You may be screened starting at age 6 if you are at high risk. Talk with your health care provider about your test results,  treatment options, and if necessary, the need for more tests. Vaccines  Your health care provider may recommend certain vaccines, such as:  Influenza vaccine. This is recommended every year.  Tetanus, diphtheria, and acellular pertussis (Tdap, Td) vaccine. You may  need a Td booster every 10 years.  Zoster vaccine. You may need this after age 21.  Pneumococcal 13-valent conjugate (PCV13) vaccine. One dose is recommended after age 49.  Pneumococcal polysaccharide (PPSV23) vaccine. One dose is recommended after age 30. Talk to your health care provider about which screenings and vaccines you need and how often you need them. This information is not intended to replace advice given to you by your health care provider. Make sure you discuss any questions you have with your health care provider. Document Released: 01/14/2015 Document Revised: 09/07/2015 Document Reviewed: 10/19/2014 Elsevier Interactive Patient Education  2017 [place] Prevention in the Home Falls can cause injuries. They can happen to people of all ages. There are many things you can do to make your home safe and to help prevent falls. What can I do on the outside of my home?  Regularly fix the edges of walkways and driveways and fix any cracks.  Remove anything that might make you trip as you walk through a door, such as a raised step or threshold.  Trim any bushes or trees on the path to your home.  Use bright outdoor lighting.  Clear any walking paths of anything that might make someone trip, such as rocks or tools.  Regularly check to see if handrails are loose or broken. Make sure that both sides of any steps have handrails.  Any raised decks and porches should have guardrails on the edges.  Have any leaves, snow, or ice cleared regularly.  Use sand or salt on walking paths during winter.  Clean up any spills in your garage right away. This includes oil or grease spills. What can I do in the bathroom?  Use night lights.  Install grab bars by the toilet and in the tub and shower. Do not use towel bars as grab bars.  Use non-skid mats or decals in the tub or shower.  If you need to sit down in the shower, use a plastic, non-slip stool.  Keep the floor  dry. Clean up any water that spills on the floor as soon as it happens.  Remove soap buildup in the tub or shower regularly.  Attach bath mats securely with double-sided non-slip rug tape.  Do not have throw rugs and other things on the floor that can make you trip. What can I do in the bedroom?  Use night lights.  Make sure that you have a light by your bed that is easy to reach.  Do not use any sheets or blankets that are too big for your bed. They should not hang down onto the floor.  Have a firm chair that has side arms. You can use this for support while you get dressed.  Do not have throw rugs and other things on the floor that can make you trip. What can I do in the kitchen?  Clean up any spills right away.  Avoid walking on wet floors.  Keep items that you use a lot in easy-to-reach places.  If you need to reach something above you, use a strong step stool that has a grab bar.  Keep electrical cords out of the way.  Do not use floor polish or wax that makes floors  slippery. If you must use wax, use non-skid floor wax.  Do not have throw rugs and other things on the floor that can make you trip. What can I do with my stairs?  Do not leave any items on the stairs.  Make sure that there are handrails on both sides of the stairs and use them. Fix handrails that are broken or loose. Make sure that handrails are as long as the stairways.  Check any carpeting to make sure that it is firmly attached to the stairs. Fix any carpet that is loose or worn.  Avoid having throw rugs at the top or bottom of the stairs. If you do have throw rugs, attach them to the floor with carpet tape.  Make sure that you have a light switch at the top of the stairs and the bottom of the stairs. If you do not have them, ask someone to add them for you. What else can I do to help prevent falls?  Wear shoes that:  Do not have high heels.  Have rubber bottoms.  Are comfortable and fit you  well.  Are closed at the toe. Do not wear sandals.  If you use a stepladder:  Make sure that it is fully opened. Do not climb a closed stepladder.  Make sure that both sides of the stepladder are locked into place.  Ask someone to hold it for you, if possible.  Clearly mark and make sure that you can see:  Any grab bars or handrails.  First and last steps.  Where the edge of each step is.  Use tools that help you move around (mobility aids) if they are needed. These include:  Canes.  Walkers.  Scooters.  Crutches.  Turn on the lights when you go into a dark area. Replace any light bulbs as soon as they burn out.  Set up your furniture so you have a clear path. Avoid moving your furniture around.  If any of your floors are uneven, fix them.  If there are any pets around you, be aware of where they are.  Review your medicines with your doctor. Some medicines can make you feel dizzy. This can increase your chance of falling. Ask your doctor what other things that you can do to help prevent falls. This information is not intended to replace advice given to you by your health care provider. Make sure you discuss any questions you have with your health care provider. Document Released: 10/14/2008 Document Revised: 05/26/2015 Document Reviewed: 01/22/2014 Elsevier Interactive Patient Education  2017 [employer].

## 2019-11-24 NOTE — Progress Notes (Signed)
Subjective:   Todd Larson is a 79 y.o. male who presents for Medicare Annual/Subsequent preventive examination.  Review of Systems     Cardiac Risk Factors include: advanced age (>30men, >32 women);dyslipidemia;hypertension;male gender;obesity (BMI >30kg/m2)     Objective:    Today's Vitals   11/24/19 0920  BP: 130/68  Pulse: 98  Resp: 20  Temp: 98.6 F (37 C)  SpO2: 98%  Weight: 228 lb 8 oz (103.6 kg)   Body mass index is 34.74 kg/m.  Advanced Directives 11/24/2019 01/27/2019 11/07/2016 12/07/2014  Does Patient Have a Medical Advance Directive? No No No No  Would patient like information on creating a medical advance directive? No - Patient declined No - Patient declined Yes (MAU/Ambulatory/Procedural Areas - Information given) Yes - Educational materials given    Current Medications (verified) Outpatient Encounter Medications as of 11/24/2019  Medication Sig   finasteride (PROSCAR) 5 MG tablet Take 5 mg by mouth daily.   fluticasone (FLONASE) 50 MCG/ACT nasal spray USE 1 SPRAY(S) IN EACH NOSTRIL TWICE DAILY AS NEEDED FOR RHINITIS   hydrochlorothiazide (HYDRODIURIL) 25 MG tablet Take 1 tablet by mouth once daily   ibuprofen (ADVIL) 600 MG tablet Take 1 tablet (600 mg total) by mouth every 8 (eight) hours as needed.   losartan (COZAAR) 50 MG tablet Take 1 tablet by mouth once daily   Polyvinyl Alcohol-Povidone (REFRESH OP) Place 1 drop as needed into both eyes (for dry eyes).   No facility-administered encounter medications on file as of 11/24/2019.    Allergies (verified) Amoxicillin-pot clavulanate   History: Past Medical History:  Diagnosis Date   Arthritis    cervical spine - T7-8 & lumbar area - bone spurs up & down the back    Bulging discs     thoracic spine   Diverticulosis of colon    GERD (gastroesophageal reflux disease)    related to intestinal system, - has started Align  4-5 days ago-    Hyperlipidemia    Hypertension    Inguinal  hernia    OSA (obstructive sleep apnea)    saw Dr. Annamaria Boots- 02/2015- pt. remarks that as a result BP med. was changed , pt. has a sleep number bed & he reports the score averages in the 80% range    PSA elevation    History of    Recurrent sinus infections    related to fracture nose x2, also reports that he has a deviated septum   Past Surgical History:  Procedure Laterality Date   COLONOSCOPY  2008   3 internal hemorrhoids, diverticulosis;Dr.Jacobs   INSERTION OF MESH N/A 11/13/2016   Procedure: INSERTION OF MESH;  Surgeon: Ralene Ok, MD;  Location: [facility];  Service: General;  Laterality: N/A;   LAPAROSCOPIC INGUINAL HERNIA WITH UMBILICAL HERNIA Left 60/10/9321   Procedure: LAPAROSCOPIC LEFT INGUINAL HERNIA  AND UMBILICAL HERNIA REPAIRS WITH MESH;  Surgeon: Ralene Ok, MD;  Location: [facility];  Service: General;  Laterality: Left;   PROSTATE BIOPSY     x 2, once abnormal, once normal   TONSILLECTOMY     Family History  Problem Relation Age of Onset   Lung cancer Maternal Grandfather        enviromental    Heart attack Paternal Uncle    Prostate cancer Brother    Social History   Socioeconomic History   Marital status: Single    Spouse name: Not on file   Number of children: 0   Years of education: Not  on file   Highest education level: Not on file  Occupational History   Occupation: retired  Tobacco Use   Smoking status: Never Smoker   Smokeless tobacco: Never Used  Scientific laboratory technician Use: Never used  Substance and Sexual Activity   Alcohol use: Yes    Alcohol/week: 8.0 - 9.0 standard drinks    Types: 8 - 9 Glasses of wine per week    Comment: social/ drinks wine(white)   Drug use: No   Sexual activity: Not on file  Other Topics Concern   Not on file  Social History Narrative   Retired    Single    No kids       He likes to read, volunteers with an animal rescue group.       Social Determinants of Health   Financial Resource  Strain: Low Risk    Difficulty of Paying Living Expenses: Not hard at all  Food Insecurity: No Food Insecurity   Worried About Charity fundraiser in the Last Year: Never true   [place] in the Last Year: Never true  Transportation Needs: No Transportation Needs   Lack of Transportation (Medical): No   Lack of Transportation (Non-Medical): No  Physical Activity: Insufficiently Active   Days of Exercise per Week: 3 days   Minutes of Exercise per Session: 20 min  Stress: No Stress Concern Present   Feeling of Stress : Not at all  Social Connections: Socially Isolated   Frequency of Communication with Friends and Family: More than three times a week   Frequency of Social Gatherings with Friends and Family: Never   Attends Religious Services: Never   Marine scientist or Organizations: No   Attends Music therapist: Never   Marital Status: Never married    Tobacco Counseling Counseling given: Not Answered   Clinical Intake:  Pre-visit preparation completed: Yes  Pain : No/denies pain (pt stated that left shoulder hurts when moving at time)     BMI - recorded: 347.74 Nutritional Status: BMI > 30  Obese Nutritional Risks: None Diabetes: No  How often do you need to have someone help you when you read instructions, pamphlets, or other written materials from your doctor or pharmacy?: 1 - Never  Diabetic?No  Interpreter Needed?: No  Information entered by :: Charlott Rakes, LPN   Activities of Daily Living In your present state of health, do you have any difficulty performing the following activities: 11/24/2019  Hearing? N  Vision? N  Difficulty concentrating or making decisions? N  Walking or climbing stairs? Y  Dressing or bathing? N  Doing errands, shopping? N  Preparing Food and eating ? N  Using the Toilet? N  In the past six months, have you accidently leaked urine? N  Do you have problems with loss of bowel control? N   Managing your Medications? N  Managing your Finances? N  Housekeeping or managing your Housekeeping? N  Some recent data might be hidden    Patient Care Team: Dorothyann Peng, NP as PCP - General (Family Medicine) Puschinsky, Fransico Him., MD (Urology)  Indicate any recent Medical Services you may have received from other than [facility] providers in the past year (date may be approximate).     Assessment:   This is a routine wellness examination for [place].  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 0 0 0 No No  Number falls in past yr: 0 - - - -  Injury with Fall? 0 - - - -  Risk for fall due to : Impaired vision - - - -  Follow up Falls prevention discussed - - - -    Any stairs in or around the home? No  If so, are there any without handrails? No  Home free of loose throw rugs in walkways, pet beds, electrical cords, etc? Yes  Adequate lighting in your home to reduce risk of falls? Yes   ASSISTIVE DEVICES UTILIZED TO PREVENT FALLS:  Life alert? No  Use of a cane, walker or w/c? No  Grab bars in the bathroom? No  Shower chair or bench in shower? No  Elevated toilet seat or a handicapped toilet? No    TIMED UP AND GO:  Was the test performed? Yes .  Length of time to ambulate 10 feet: 15 sec.   Gait steady and fast without use of assistive device  Cognitive Function: MMSE - Mini Mental State Exam 12/07/2014  Not completed: (No Data)     6CIT Screen 11/24/2019  What Year? 0 points  What month? 0 points  Count back from 20 0 points  Months in reverse 0 points  Repeat phrase 0 points    Immunizations Immunization History  Administered Date(s) Administered   Fluad Quad(high Dose 65+) 09/27/2018, 09/28/2019   H1N1 12/24/2007   Influenza Split 10/03/2010, 10/23/2011   Influenza Whole 11/21/2006, 09/25/2007, 09/16/2009   Influenza, High Dose Seasonal PF 10/30/2012, 01/05/2016, 10/01/2016, 10/10/2017   Influenza,inj,Quad PF,6+ Mos 10/30/2013, 09/27/2014   PFIZER SARS-COV-2 Vaccination 01/21/2019, 02/11/2019, 09/30/2019   Pneumococcal Conjugate-13 12/07/2014   Pneumococcal Polysaccharide-23 10/02/2007   Td 11/21/2008     Flu Vaccine status: Up to date Done 09/28/19  Pneumococcal vaccine status: Up to date Covid-19 vaccine status: Completed vaccines  Qualifies for Shingles Vaccine? Yes   Zostavax completed No   Shingrix Completed?: No.    Education has been provided regarding the importance of this vaccine. Patient has been advised to call insurance company to determine out of pocket expense if they have not yet received this vaccine. Advised may also receive vaccine at local pharmacy or Health Dept. Verbalized acceptance and understanding.  Screening Tests Health Maintenance  Topic Date Due   Hepatitis C Screening  Never done   INFLUENZA VACCINE  Completed   COVID-19 Vaccine  Completed   PNA vac Low Risk Adult  Completed    Health Maintenance  Health Maintenance Due  Topic Date Due   Hepatitis C Screening  Never done    Colorectal cancer screening: No longer  required. .    Additional Screening:  Hepatitis C Screening: does qualify  Vision  Screening: Recommended annual ophthalmology exams for early detection of glaucoma and other disorders of the eye. Is the patient up to date with their annual eye exam?  Yes  Who is the provider or what is the name of the office in which the patient attends annual eye exams? [facility]   Dental Screening: Recommended annual dental exams for proper oral hygiene  Community Resource Referral / Chronic Care Management: CRR required this visit?  No   CCM required this visit?  No      Plan:     I have personally reviewed and noted the following in the patients chart:    Medical and social history  Use of alcohol, tobacco or illicit drugs   Current medications and supplements  Functional ability and status  Nutritional status  Physical activity  Advanced directives  List of other physicians  Hospitalizations, surgeries, and ER visits in previous 12 months  Vitals  Screenings to include cognitive, depression, and falls  Referrals and appointments  In addition, I have reviewed and discussed with patient certain preventive protocols, quality metrics, and best practice recommendations. A written personalized care plan for preventive services as well as general preventive health recommendations were provided to patient.     Willette Brace, LPN   37/90/2409   Nurse Notes: None

## 2019-12-09 ENCOUNTER — Other Ambulatory Visit: Payer: Self-pay

## 2019-12-09 ENCOUNTER — Encounter: Payer: Self-pay | Admitting: Adult Health

## 2019-12-09 ENCOUNTER — Ambulatory Visit (INDEPENDENT_AMBULATORY_CARE_PROVIDER_SITE_OTHER): Payer: Medicare Other | Admitting: Adult Health

## 2019-12-09 VITALS — BP 132/68 | HR 87 | Temp 98.6°F | Ht 68.0 in | Wt 229.0 lb

## 2019-12-09 DIAGNOSIS — T148XXA Other injury of unspecified body region, initial encounter: Secondary | ICD-10-CM | POA: Diagnosis not present

## 2019-12-09 MED ORDER — CYCLOBENZAPRINE HCL 10 MG PO TABS: 10.0000 mg | ORAL_TABLET | Freq: Three times a day (TID) | ORAL | 0 refills | Status: DC | PRN

## 2019-12-09 NOTE — Progress Notes (Signed)
Subjective:    Patient ID: Todd Larson, male    DOB: 1940/10/07, 79 y.o.   MRN: [id number]  HPI 79 year old male who  has a past medical history of Arthritis, Bulging discs, Diverticulosis of colon, GERD (gastroesophageal reflux disease), Hyperlipidemia, Hypertension, Inguinal hernia, OSA (obstructive sleep apnea), PSA elevation, and Recurrent sinus infections.  He presents to the office today for continued left sided shoulder pain.  He was originally seen on November 11, 2019 which time he was diagnosed with tendinitis.  He reports that this pain has resolved but he has a new pain that has been present on and off for an unknown amount of time.  This discomfort is located along the left chest wall, believes that he could have injured it while doing exercises.  Has noticed some soft tissue swelling to the pectoral muscle.  Has no loss of range of motion or grip strength.  Does not use over-the-counter medications frequently as he does not feel as though the pain is bad enough to take anything.  Review of Systems See HPI   Past Medical History:  Diagnosis Date  . Arthritis    cervical spine - T7-8 & lumbar area - bone spurs up & down the back   . Bulging discs     thoracic spine  . Diverticulosis of colon   . GERD (gastroesophageal reflux disease)    related to intestinal system, - has started Align  4-5 days ago-   . Hyperlipidemia   . Hypertension   . Inguinal hernia   . OSA (obstructive sleep apnea)    saw Dr. Annamaria Boots- 02/2015- pt. remarks that as a result BP med. was changed , pt. has a sleep number bed & he reports the score averages in the 80% range   . PSA elevation    History of   . Recurrent sinus infections    related to fracture nose x2, also reports that he has a deviated septum    Social History   Socioeconomic History  . Marital status: Single    Spouse name: Not on file  . Number of children: 0  . Years of education: Not on file  . Highest education level: Not on  file  Occupational History  . Occupation: retired  Tobacco Use  . Smoking status: Never Smoker  . Smokeless tobacco: Never Used  Vaping Use  . Vaping Use: Never used  Substance and Sexual Activity  . Alcohol use: Yes    Alcohol/week: 8.0 - 9.0 standard drinks    Types: 8 - 9 Glasses of wine per week    Comment: social/ drinks wine(white)  . Drug use: No  . Sexual activity: Not on file  Other Topics Concern  . 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       Social Determinants of Health   Financial Resource Strain: Low Risk   . Difficulty of Paying Living Expenses: Not hard at all  Food Insecurity: No Food Insecurity  . Worried About Charity fundraiser in the Last Year: Never true  . Ran Out of Food in the Last Year: Never true  Transportation Needs: No Transportation Needs  . Lack of Transportation (Medical): No  . Lack of Transportation (Non-Medical): No  Physical Activity: Insufficiently Active  . Days of Exercise per Week: 3  days  . Minutes of Exercise per Session: 20 min  Stress: No Stress Concern Present  . Feeling of Stress : Not at all  Social Connections: Socially Isolated  . Frequency of Communication with Friends and Family: More than three times a week  . Frequency of Social Gatherings with Friends and Family: Never  . Attends Religious Services: Never  . Active Member of Clubs or Organizations: No  . Attends Archivist Meetings: Never  . Marital Status: Never married  Intimate Partner Violence: Not At Risk  . Fear of Current or Ex-Partner: No  . Emotionally Abused: No  . Physically Abused: No  . Sexually Abused: No    Past Surgical History:  Procedure Laterality Date  . COLONOSCOPY  2008   3 internal hemorrhoids, diverticulosis;Dr.Jacobs  . INSERTION OF MESH N/A 11/13/2016   Procedure: INSERTION OF MESH;  Surgeon: Ralene Ok, MD;  Location:  [facility];  Service: General;  Laterality: N/A;  . LAPAROSCOPIC INGUINAL HERNIA WITH UMBILICAL HERNIA Left 22/97/9892   Procedure: LAPAROSCOPIC LEFT INGUINAL HERNIA  AND UMBILICAL HERNIA REPAIRS WITH MESH;  Surgeon: Ralene Ok, MD;  Location: [facility];  Service: General;  Laterality: Left;  . PROSTATE BIOPSY     x 2, once abnormal, once normal  . TONSILLECTOMY      Family History  Problem Relation Age of Onset  . Lung cancer Maternal Grandfather        enviromental   . Heart attack Paternal Uncle   . Prostate cancer Brother     Allergies  Allergen Reactions  . Amoxicillin-Pot Clavulanate Diarrhea and Other (See Comments)    Night sweats, not allergic to Amoxicillin, just can't tolerate augmentin    Current Outpatient Medications on File Prior to Visit  Medication Sig Dispense Refill  . finasteride (PROSCAR) 5 MG tablet Take 5 mg by mouth daily.    . fluticasone (FLONASE) 50 MCG/ACT nasal spray USE 1 SPRAY(S) IN EACH NOSTRIL TWICE DAILY AS NEEDED FOR RHINITIS 48 g 3  . hydrochlorothiazide (HYDRODIURIL) 25 MG tablet Take 1 tablet by mouth once daily 90 tablet 0  . losartan (COZAAR) 50 MG tablet Take 1 tablet by mouth once daily 90 tablet 0  . Polyvinyl Alcohol-Povidone (REFRESH OP) Place 1 drop as needed into both eyes (for dry eyes).    Marland Kitchen ibuprofen (ADVIL) 600 MG tablet Take 1 tablet (600 mg total) by mouth every 8 (eight) hours as needed. (Patient not taking: Reported on 12/09/2019) 30 tablet 0   No current facility-administered medications on file prior to visit.    BP 132/68   Pulse 87   Temp 98.6 F (37 C) (Oral)   Ht 5\' 8"  (1.727 m)   Wt 229 lb (103.9 kg)   SpO2 98%   BMI 34.82 kg/m       Objective:   Physical Exam Vitals and nursing note reviewed.  Constitutional:      Appearance: Normal appearance. He is obese.  Musculoskeletal:        General: Swelling and tenderness present. No deformity or signs of injury. Normal range of motion.     Right lower leg: No  edema.     Left lower leg: No edema.     Comments: He has mild soft tissue swelling to left clavicular head of pectoralis major muscle  Neurological:     Mental Status: He is alert.       Assessment & Plan:  1. Muscle strain -Appears to be more muscular strain  in nature.  Advise heating pad, will send in short course of Flexeril.  Follow-up if no improvement in the next week or 2 - cyclobenzaprine (FLEXERIL) 10 MG tablet; Take 1 tablet (10 mg total) by mouth 3 (three) times daily as needed for muscle spasms.  Dispense: 30 tablet; Refill: 0   Dorothyann Peng, NP

## 2019-12-11 ENCOUNTER — Encounter: Payer: Self-pay | Admitting: Adult Health

## 2019-12-22 ENCOUNTER — Other Ambulatory Visit: Payer: Self-pay | Admitting: Adult Health

## 2019-12-22 DIAGNOSIS — I10 Essential (primary) hypertension: Secondary | ICD-10-CM

## 2020-01-05 ENCOUNTER — Other Ambulatory Visit: Payer: Self-pay | Admitting: Adult Health

## 2020-01-05 DIAGNOSIS — I10 Essential (primary) hypertension: Secondary | ICD-10-CM

## 2020-02-19 ENCOUNTER — Encounter: Payer: Self-pay | Admitting: Adult Health

## 2020-02-22 NOTE — Progress Notes (Signed)
Note duplication 

## 2020-02-23 ENCOUNTER — Other Ambulatory Visit: Payer: Self-pay

## 2020-02-23 ENCOUNTER — Ambulatory Visit (INDEPENDENT_AMBULATORY_CARE_PROVIDER_SITE_OTHER): Payer: Medicare Other

## 2020-02-23 ENCOUNTER — Encounter: Payer: Self-pay | Admitting: Family Medicine

## 2020-02-23 ENCOUNTER — Ambulatory Visit: Payer: Self-pay

## 2020-02-23 ENCOUNTER — Ambulatory Visit (INDEPENDENT_AMBULATORY_CARE_PROVIDER_SITE_OTHER): Payer: Medicare Other | Admitting: Family Medicine

## 2020-02-23 VITALS — BP 140/64 | HR 109 | Ht 68.0 in | Wt 231.8 lb

## 2020-02-23 DIAGNOSIS — G8929 Other chronic pain: Secondary | ICD-10-CM

## 2020-02-23 DIAGNOSIS — M25512 Pain in left shoulder: Secondary | ICD-10-CM

## 2020-02-23 DIAGNOSIS — M19012 Primary osteoarthritis, left shoulder: Secondary | ICD-10-CM | POA: Diagnosis not present

## 2020-02-23 NOTE — Progress Notes (Signed)
I, [name], LAT, ATC, am serving as scribe for Dr. Lynne Leader.  Todd Larson is a 80 y.o. male who presents to [place] [facility] today for L shoulder pain that has been bothering him since Sept/Oct 2021 after getting some chiropractic adjustments for his neck.  He was seen by his PCP x 2 in November and December 2021 and has tried [employer].  His pain location has moved from his L shoulder to his chest.  He locates his current pain to his L anterior shoulder/chest.  He was previously seen by Dr. Georgina Snell on 02/25/19 for his low back.  Radiating pain: yes, into his L anterior chest wall L shoulder mechanical symptoms: Aggravating factors: laying on his side; sitting w/ shoulders rounded forward; pushing up off his L arm to get out of a chair Treatments tried: Flexeril; heat; IBU   Pertinent review of systems: No fevers or chills  Relevant historical information: Hypertension   Exam:  BP 140/64 (BP Location: Right Arm, Patient Position: Sitting, Cuff Size: Large)   Pulse (!) 109   Ht 5\' 8"  (1.727 m)   Wt 231 lb 12.8 oz (105.1 kg)   SpO2 97%   BMI 35.25 kg/m  General: Well Developed, well nourished, and in no acute distress.   MSK:  C-spine normal-appearing nontender midline.  Tender palpation left trapezius.  Decreased cervical motion. Left shoulder prominent left pectoralis otherwise normal-appearing Tender palpation AC joint. Shoulder Motion normal abduction limited external rotation normal internal rotation. Intact strength. Positive crossover arm compression test.  Mildly positive Hawkins and Neer's test.     Lab and Radiology Results  Diagnostic Limited MSK Ultrasound of: Left shoulder Biceps tendon intact normal. Subscapularis tendon is intact. Supraspinatus tendon is intact. Infraspinatus tendon is intact. AC joint degenerative with effusion. Pectoralis muscle is normal-appearing Impression: AC DJD  X-ray left shoulder obtained today  personally and independently interpreted Moderate AC joint DJD.  Mild glenohumeral DJD.  No acute fractures. Await formal radiology review    Assessment and Plan: 80 y.o. male with left anterior shoulder pain.  Predominantly due to [facility] DJD.  Patient has some scapular and periscapular dysfunction which I think is contributing to his pain.  He should do well with physical therapy. If not improving consider steroid injection AC joint.  Also consider MRI in the future if needed.  Recheck in about 1 month.  PDMP not reviewed this encounter. Orders Placed This Encounter  Procedures  . [place] LIMITED JOINT SPACE STRUCTURES UP LEFT(NO LINKED CHARGES)    Order Specific Question:   Reason for Exam (SYMPTOM  OR DIAGNOSIS REQUIRED)    Answer:   L shoulder pain    Order Specific Question:   Preferred imaging location?    Answer:   [place]  . DG Shoulder Left    Standing Status:   Future    Number of Occurrences:   1    Standing Expiration Date:   02/22/2021    Order Specific Question:   Reason for Exam (SYMPTOM  OR DIAGNOSIS REQUIRED)    Answer:   eval left shoulder    Order Specific Question:   Preferred imaging location?    Answer:   [facility]  . Ambulatory referral to Physical Therapy    Referral Priority:   Routine    Referral Type:   Physical Medicine    Referral Reason:   Specialty Services Required    Requested Specialty:   Physical  Therapy   No orders of the defined types were placed in this encounter.    Discussed warning signs or symptoms. Please see discharge instructions. Patient expresses understanding.   The above documentation has been reviewed and is accurate and complete Lynne Leader, M.D.

## 2020-02-23 NOTE — Patient Instructions (Signed)
Thank you for coming in today.  I've referred you to Physical Therapy.  Let us know if you don't hear from them in one week.  Please get an Xray today before you leave  Recheck in 1 month.   We will consider injection in the future. If needed.

## 2020-02-24 NOTE — Progress Notes (Signed)
X-ray left shoulder no fracture.  Medium arthritis present.

## 2020-03-01 ENCOUNTER — Other Ambulatory Visit: Payer: Self-pay

## 2020-03-01 ENCOUNTER — Encounter: Payer: Self-pay | Admitting: Physical Therapy

## 2020-03-01 ENCOUNTER — Ambulatory Visit: Payer: Medicare Other | Attending: Family Medicine | Admitting: Physical Therapy

## 2020-03-01 DIAGNOSIS — M25512 Pain in left shoulder: Secondary | ICD-10-CM | POA: Diagnosis not present

## 2020-03-01 DIAGNOSIS — M546 Pain in thoracic spine: Secondary | ICD-10-CM | POA: Insufficient documentation

## 2020-03-01 DIAGNOSIS — M25612 Stiffness of left shoulder, not elsewhere classified: Secondary | ICD-10-CM | POA: Diagnosis not present

## 2020-03-01 DIAGNOSIS — M62830 Muscle spasm of back: Secondary | ICD-10-CM | POA: Diagnosis not present

## 2020-03-01 DIAGNOSIS — M6281 Muscle weakness (generalized): Secondary | ICD-10-CM | POA: Insufficient documentation

## 2020-03-01 NOTE — Therapy (Signed)
[place]. [place], [place], [address] Phone: [phone]   Fax:  [phone]  Physical Therapy Evaluation  Patient Details  Name: Todd Larson MRN: [id number] Date of Birth: [birth year] Referring Provider (PT): Marikay Alar Date: 03/01/2020   PT End of Session - 03/01/20 1221    Visit Number 1    Date for PT Re-Evaluation 05/01/20    PT Start Time 1100    PT Stop Time 1134    PT Time Calculation (min) 34 min    Activity Tolerance Patient tolerated treatment well    Behavior During Therapy [facility] for tasks assessed/performed           Past Medical History:  Diagnosis Date  . Arthritis    cervical spine - T7-8 & lumbar area - bone spurs up & down the back   . Bulging discs     thoracic spine  . Diverticulosis of colon   . GERD (gastroesophageal reflux disease)    related to intestinal system, - has started Align  4-5 days ago-   . Hyperlipidemia   . Hypertension   . Inguinal hernia   . OSA (obstructive sleep apnea)    saw Dr. Annamaria Boots- 02/2015- pt. remarks that as a result BP med. was changed , pt. has a sleep number bed & he reports the score averages in the 80% range   . PSA elevation    History of   . Recurrent sinus infections    related to fracture nose x2, also reports that he has a deviated septum    Past Surgical History:  Procedure Laterality Date  . COLONOSCOPY  2008   3 internal hemorrhoids, diverticulosis;Dr.Jacobs  . INSERTION OF MESH N/A 11/13/2016   Procedure: INSERTION OF MESH;  Surgeon: Ralene Ok, MD;  Location: [place];  Service: General;  Laterality: N/A;  . LAPAROSCOPIC INGUINAL HERNIA WITH UMBILICAL HERNIA Left 35/32/9924   Procedure: LAPAROSCOPIC LEFT INGUINAL HERNIA  AND UMBILICAL HERNIA REPAIRS WITH MESH;  Surgeon: Ralene Ok, MD;  Location: [place];  Service: General;  Laterality: Left;  . PROSTATE BIOPSY     x 2, once abnormal, once normal  . TONSILLECTOMY       There were no vitals filed for this visit.    Subjective Assessment - 03/01/20 1059    Subjective Pt reports that he has had intermittent L shoulder pain for about 6 months following a chiropractic adjustment to neck. Pt states that he has had some radiculopathy into LUE in the past from C5-C6. States he has not had radiating pain down LUE. Denies N/T in UE. Reports majority of pain is in anterior shoulder radiating to the front of chest towards the pec. Pt had [place] to L shoulder which showed some L AC joint arthritis. Pt states that aggravating factors are shoulder being pushed forward/hunched over.    Pertinent History HTN, hx of cervical pain    Limitations House hold activities    Diagnostic tests [place] L shoulder    Patient Stated Goals reduce pain in L shoulder    Currently in Pain? Yes    Pain Score 2     Pain Location Shoulder    Pain Orientation Left    Pain Descriptors / Indicators Aching;Nagging    Pain Type Chronic pain    Pain Onset More than a month ago    Pain Frequency Intermittent    Aggravating Factors  "hunching" shoulders, laying on L side  Pain Relieving Factors rest, meds              [facility] PT Assessment - 03/01/20 0001      Assessment   Medical Diagnosis L shoulder pain    Referring Provider (PT) Georgina Snell    Onset Date/Surgical Date --   Sept/Oct 2021   Hand Dominance Left    Next MD Visit 03/21/2020    Prior Therapy yes for cervical pain      Precautions   Precautions None      Restrictions   Weight Bearing Restrictions No      Balance Screen   Has the patient fallen in the past 6 months No    Has the patient had a decrease in activity level because of a fear of falling?  No    Is the patient reluctant to leave their home because of a fear of falling?  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 Intact      Posture/Postural Control   Posture/Postural Control Postural limitations    Postural Limitations Rounded Shoulders;Forward head      Palpation   Palpation comment tender to palpation R anterior shoulder and R pec      Special Tests    Special Tests Rotator Cuff Impingement    Rotator Cuff Impingment tests Neer impingement test;Hawkins- Kennedy test;Empty Can test;Speed's test      Neer Impingement test    Findings Positive    Side Right      Hawkins-Kennedy test   Findings Positive    Side Right      Empty Can test   Findings Positive    Side Right      Speed's test   Findings Negative    Side Right                      Objective measurements completed on examination: See above findings.       [facility] Adult PT Treatment/Exercise - 03/01/20 0001      Exercises   Exercises Shoulder                  PT Education - 03/01/20 1221    Education Details Pt educated on POC and HEP    Person(s) Educated Patient    Methods Explanation;Demonstration;Handout    Comprehension Verbalized understanding;Returned demonstration            PT Short Term Goals - 03/01/20 1225      PT SHORT TERM GOAL #1   Title independent with initial HEP    Time 2    Period Weeks    Status New    Target Date 03/15/20             PT Long Term Goals - 03/01/20 1226      PT LONG TERM GOAL #1   Title understand body mechanics    Time 6    Period Weeks    Status New    Target Date 04/12/20      PT LONG TERM GOAL #2   Title decrease L shoulder pain 50%    Time 6    Period Weeks    Status New    Target Date 04/12/20  PT LONG TERM GOAL #3   Title Pt will demo I with advanced HEP    Time 6    Period Weeks    Status New    Target Date 04/12/20                  Plan - 03/01/20 1222    Clinical Impression Statement Pt presents to clinic with reports of chronic, intermittently occurring, L shoulder pain. Pt localizes  pain to anterior L shoulder occasionally extending into L chest/pec region. Pt had [place] of L shoulder which shows degenerative arthritis of AC joint. Pt demos BUE strength WFL, denies N/T to B UEs. Does report hx of cervical radiculopathy but states not experiencing this pain now. Pt demos increased FHP and thoracic kyphosis, positive shoulder impingement testing, neg biceps test, and decreased postural stability. Pt would benefit from skilled PT to address the above impairments.    Examination-Participation Restrictions Community Activity;Interpersonal Relationship    Stability/Clinical Decision Making Stable/Uncomplicated    Clinical Decision Making Low    Rehab Potential Good    PT Frequency 2x / week    PT Duration 6 weeks    PT Treatment/Interventions ADLs/Self Care Home Management;Electrical Stimulation;Iontophoresis 4mg /ml Dexamethasone;Moist Heat;Therapeutic activities;Therapeutic exercise;Neuromuscular re-education;Patient/family education;Manual techniques;Passive range of motion;Taping    PT Next Visit Plan pec stretching, postural education and stability ex's, manual/modalities as indicated    PT Home Exercise Plan see pt instructions    Consulted and Agree with Plan of Care Patient           Patient will benefit from skilled therapeutic intervention in order to improve the following deficits and impairments:  Increased muscle spasms,Impaired UE functional use,Pain,Impaired flexibility,Postural dysfunction  Visit Diagnosis: Acute pain of left shoulder  Stiffness of left shoulder, not elsewhere classified  Muscle weakness (generalized)     Problem List Patient Active Problem List   Diagnosis Date Noted  . Right knee meniscal tear 06/01/2015  . Obstructive sleep apnea 02/07/2015  . Thoracic spine pain 03/29/2014  . Tenosynovitis of thumb 03/29/2014  . Cervical radiculopathy due to degenerative joint disease of spine 10/10/2013  . Bursitis of left shoulder 09/23/2013  .  Chronic low back pain 12/15/2012  . Chronic cervical pain 06/09/2012  . Hyperlipidemia 07/06/2010  . Essential hypertension 11/03/2009  . INGUINAL HERNIA, LEFT 11/03/2009  . Nonallergic rhinitis 08/24/2009  . VENTRAL HERNIA 05/20/2009  . DIVERTICULOSIS, COLON 05/20/2009  . NEVUS, ATYPICAL 10/26/2008  . GERD 10/01/2006   Amador Cunas, PT, DPT Donald Prose Ruhaan Nordahl 03/01/2020, 12:29 PM  [place]. [place], [place], [address] Phone: [phone]   Fax:  [phone]  Name: Todd Larson MRN: [id number] Date of Birth: 15-Jun-1940

## 2020-03-01 NOTE — Patient Instructions (Signed)
Access Code: [email or web address] URL: [email or web address] Date: 03/01/2020 Prepared by: Amador Cunas  Exercises Standing Shoulder Row with Anchored Resistance - 1 x daily - 7 x weekly - 3 sets - 10 reps - 3 sec hold Shoulder Extension with Resistance - 1 x daily - 7 x weekly - 3 sets - 10 reps - 3 sec hold Shoulder External Rotation and Scapular Retraction with Resistance - 1 x daily - 7 x weekly - 3 sets - 10 reps Corner Pec Major Stretch - 1 x daily - 7 x weekly - 3 sets - 2 reps - 15-20 sec hold Seated Thoracic Lumbar Extension with Pectoralis Stretch - 1 x daily - 7 x weekly - 3 sets - 5 reps - 5 sec hold

## 2020-03-03 ENCOUNTER — Other Ambulatory Visit: Payer: Self-pay

## 2020-03-03 ENCOUNTER — Ambulatory Visit: Payer: Medicare Other | Admitting: Physical Therapy

## 2020-03-03 ENCOUNTER — Encounter: Payer: Self-pay | Admitting: Physical Therapy

## 2020-03-03 DIAGNOSIS — M62830 Muscle spasm of back: Secondary | ICD-10-CM | POA: Diagnosis not present

## 2020-03-03 DIAGNOSIS — M25612 Stiffness of left shoulder, not elsewhere classified: Secondary | ICD-10-CM

## 2020-03-03 DIAGNOSIS — M25512 Pain in left shoulder: Secondary | ICD-10-CM

## 2020-03-03 DIAGNOSIS — M6281 Muscle weakness (generalized): Secondary | ICD-10-CM

## 2020-03-03 DIAGNOSIS — M546 Pain in thoracic spine: Secondary | ICD-10-CM

## 2020-03-03 NOTE — Therapy (Signed)
[place]. [place], [place], [address] Phone: [phone]   Fax:  [phone]  Physical Therapy Treatment  Patient Details  Name: Todd Larson MRN: [id number] Date of Birth: 05/16/1940 Referring Provider (PT): Marikay Alar Date: 03/03/2020   PT End of Session - 03/03/20 0920    Visit Number 2    Date for PT Re-Evaluation 05/01/20    PT Start Time 0845    PT Stop Time 0930    PT Time Calculation (min) 45 min    Activity Tolerance Patient tolerated treatment well    Behavior During Therapy [facility] for tasks assessed/performed           Past Medical History:  Diagnosis Date  . Arthritis    cervical spine - T7-8 & lumbar area - bone spurs up & down the back   . Bulging discs     thoracic spine  . Diverticulosis of colon   . GERD (gastroesophageal reflux disease)    related to intestinal system, - has started Align  4-5 days ago-   . Hyperlipidemia   . Hypertension   . Inguinal hernia   . OSA (obstructive sleep apnea)    saw Dr. Annamaria Boots- 02/2015- pt. remarks that as a result BP med. was changed , pt. has a sleep number bed & he reports the score averages in the 80% range   . PSA elevation    History of   . Recurrent sinus infections    related to fracture nose x2, also reports that he has a deviated septum    Past Surgical History:  Procedure Laterality Date  . COLONOSCOPY  2008   3 internal hemorrhoids, diverticulosis;Dr.Jacobs  . INSERTION OF MESH N/A 11/13/2016   Procedure: INSERTION OF MESH;  Surgeon: Ralene Ok, MD;  Location: [place];  Service: General;  Laterality: N/A;  . LAPAROSCOPIC INGUINAL HERNIA WITH UMBILICAL HERNIA Left 36/64/4034   Procedure: LAPAROSCOPIC LEFT INGUINAL HERNIA  AND UMBILICAL HERNIA REPAIRS WITH MESH;  Surgeon: Ralene Ok, MD;  Location: [place];  Service: General;  Laterality: Left;  . PROSTATE BIOPSY     x 2, once abnormal, once normal  . TONSILLECTOMY       There were no vitals filed for this visit.   Subjective Assessment - 03/03/20 0847    Subjective 80% of the time it does not bother me, today is a good day    Pertinent History HTN, hx of cervical pain    Limitations House hold activities    Diagnostic tests [place] L shoulder    Patient Stated Goals reduce pain in L shoulder    Currently in Pain? No/denies                             [facility] Adult PT Treatment/Exercise - 03/03/20 0001      Shoulder Exercises: Seated   Other Seated Exercises Bent over roes 4lb Rev flys 3lb 2x10      Shoulder Exercises: Standing   Horizontal ABduction Theraband;20 reps;Both    Theraband Level (Shoulder Horizontal ABduction) Level 2 (Red)    External Rotation Left;Theraband;10 reps    Theraband Level (Shoulder External Rotation) Level 1 (Yellow)    Extension Theraband;20 reps;Both;Strengthening    Theraband Level (Shoulder Extension) Level 3 (Green)    Row Theraband;20 reps;Both;Strengthening    Theraband Level (Shoulder Row) Level 3 (Green)      Shoulder Exercises:  ROM/Strengthening   UBE (Upper Arm Bike) L1.7 x3 mine each way      Shoulder Exercises: IT sales professional 3 reps;10 seconds;20 seconds      Modalities   Modalities Moist Heat      Moist Heat Therapy   Number Minutes Moist Heat 10 Minutes    Moist Heat Location Cervical                    PT Short Term Goals - 03/03/20 0927      PT SHORT TERM GOAL #1   Title independent with initial HEP    Status Achieved             PT Long Term Goals - 03/01/20 1226      PT LONG TERM GOAL #1   Title understand body mechanics    Time 6    Period Weeks    Status New    Target Date 04/12/20      PT LONG TERM GOAL #2   Title decrease L shoulder pain 50%    Time 6    Period Weeks    Status New    Target Date 04/12/20      PT LONG TERM GOAL #3   Title Pt will demo I with advanced HEP    Time 6    Period Weeks    Status New    Target Date  04/12/20                 Plan - 03/03/20 0920    Clinical Impression Statement Pt able to complete all of the interventions today. He has a forward head and rounded shoulders. Focuses on anterior chest stretching and scapular stabilization today. Cues given throughout session to maintain neutral spine, pt tends to hold head down naturally. Tactile cues to squeeze shoulder blades together with rows. Some neck tightness reported with seated bent over exercises, applied MHP to cervical spine due to subjective complaints.    Examination-Participation Restrictions Community Activity;Interpersonal Relationship    Stability/Clinical Decision Making Stable/Uncomplicated    Rehab Potential Good    PT Frequency 2x / week    PT Treatment/Interventions ADLs/Self Care Home Management;Electrical Stimulation;Iontophoresis 4mg /ml Dexamethasone;Moist Heat;Therapeutic activities;Therapeutic exercise;Neuromuscular re-education;Patient/family education;Manual techniques;Passive range of motion;Taping    PT Next Visit Plan pec stretching, postural education and stability ex's, manual/modalities as indicated           Patient will benefit from skilled therapeutic intervention in order to improve the following deficits and impairments:  Increased muscle spasms,Impaired UE functional use,Pain,Impaired flexibility,Postural dysfunction  Visit Diagnosis: Muscle weakness (generalized)  Pain in thoracic spine  Acute pain of left shoulder  Stiffness of left shoulder, not elsewhere classified     Problem List Patient Active Problem List   Diagnosis Date Noted  . Right knee meniscal tear 06/01/2015  . Obstructive sleep apnea 02/07/2015  . Thoracic spine pain 03/29/2014  . Tenosynovitis of thumb 03/29/2014  . Cervical radiculopathy due to degenerative joint disease of spine 10/10/2013  . Bursitis of left shoulder 09/23/2013  . Chronic low back pain 12/15/2012  . Chronic cervical pain 06/09/2012  .  Hyperlipidemia 07/06/2010  . Essential hypertension 11/03/2009  . INGUINAL HERNIA, LEFT 11/03/2009  . Nonallergic rhinitis 08/24/2009  . VENTRAL HERNIA 05/20/2009  . DIVERTICULOSIS, COLON 05/20/2009  . NEVUS, ATYPICAL 10/26/2008  . GERD 10/01/2006    Scot Jun 03/03/2020, 9:27 AM  [facility] [address] [place] [place]  [place]. [place], [place], [address] Phone: [phone]   Fax:  [phone]  Name: Todd Larson MRN: [id number] Date of Birth: 1940-11-14

## 2020-03-08 ENCOUNTER — Other Ambulatory Visit: Payer: Self-pay

## 2020-03-08 ENCOUNTER — Encounter: Payer: Self-pay | Admitting: Physical Therapy

## 2020-03-08 ENCOUNTER — Ambulatory Visit: Payer: Medicare Other | Admitting: Physical Therapy

## 2020-03-08 DIAGNOSIS — M546 Pain in thoracic spine: Secondary | ICD-10-CM | POA: Diagnosis not present

## 2020-03-08 DIAGNOSIS — M25512 Pain in left shoulder: Secondary | ICD-10-CM

## 2020-03-08 DIAGNOSIS — M62830 Muscle spasm of back: Secondary | ICD-10-CM | POA: Diagnosis not present

## 2020-03-08 DIAGNOSIS — M6281 Muscle weakness (generalized): Secondary | ICD-10-CM

## 2020-03-08 DIAGNOSIS — M25612 Stiffness of left shoulder, not elsewhere classified: Secondary | ICD-10-CM | POA: Diagnosis not present

## 2020-03-08 NOTE — Therapy (Signed)
[place]. [place], [place], [address] Phone: [phone]   Fax:  [phone]  Physical Therapy Treatment  Patient Details  Name: Todd Larson MRN: [id number] Date of Birth: 16-May-1940 Referring Provider (PT): Marikay Alar Date: 03/08/2020   PT End of Session - 03/08/20 1139    Visit Number 2    Date for PT Re-Evaluation 05/01/20    PT Start Time 1055    PT Stop Time 1135    PT Time Calculation (min) 40 min    Activity Tolerance Patient tolerated treatment well    Behavior During Therapy [facility] for tasks assessed/performed           Past Medical History:  Diagnosis Date  . Arthritis    cervical spine - T7-8 & lumbar area - bone spurs up & down the back   . Bulging discs     thoracic spine  . Diverticulosis of colon   . GERD (gastroesophageal reflux disease)    related to intestinal system, - has started Align  4-5 days ago-   . Hyperlipidemia   . Hypertension   . Inguinal hernia   . OSA (obstructive sleep apnea)    saw Dr. Annamaria Boots- 02/2015- pt. remarks that as a result BP med. was changed , pt. has a sleep number bed & he reports the score averages in the 80% range   . PSA elevation    History of   . Recurrent sinus infections    related to fracture nose x2, also reports that he has a deviated septum    Past Surgical History:  Procedure Laterality Date  . COLONOSCOPY  2008   3 internal hemorrhoids, diverticulosis;Dr.Jacobs  . INSERTION OF MESH N/A 11/13/2016   Procedure: INSERTION OF MESH;  Surgeon: Ralene Ok, MD;  Location: [place];  Service: General;  Laterality: N/A;  . LAPAROSCOPIC INGUINAL HERNIA WITH UMBILICAL HERNIA Left 50/38/8828   Procedure: LAPAROSCOPIC LEFT INGUINAL HERNIA  AND UMBILICAL HERNIA REPAIRS WITH MESH;  Surgeon: Ralene Ok, MD;  Location: [place];  Service: General;  Laterality: Left;  . PROSTATE BIOPSY     x 2, once abnormal, once normal  . TONSILLECTOMY       There were no vitals filed for this visit.   Subjective Assessment - 03/08/20 1055    Subjective feeling pretty good today, yesterday getting out of bed he had some back pain    Currently in Pain? Yes    Pain Score 2                              [facility] Adult PT Treatment/Exercise - 03/08/20 0001      Shoulder Exercises: Seated   Row Theraband;20 reps;Both;Strengthening    Theraband Level (Shoulder Row) Level 3 (Green)    External Rotation Theraband;20 reps;Strengthening;Both    Theraband Level (Shoulder External Rotation) Level 2 (Red)    Other Seated Exercises Rows & Lats 25lb 2x10      Shoulder Exercises: Standing   Horizontal ABduction Theraband;20 reps;Both    Theraband Level (Shoulder Horizontal ABduction) Level 3 (Green)    Flexion Weights;20 reps;Both;Strengthening    Shoulder Flexion Weight (lbs) 3    ABduction Weights;20 reps;Both;Strengthening    Shoulder ABduction Weight (lbs) 2    Extension 20 reps;Both;Strengthening;Weights    Extension Weight (lbs) 10    Other Standing Exercises Shrugs 5lb rev rolls 2x10  Other Standing Exercises cervical retraction 2x10      Shoulder Exercises: ROM/Strengthening   UBE (Upper Arm Bike) L1.7 x3 mine each way      Shoulder Exercises: IT sales professional 5 reps;10 seconds                    PT Short Term Goals - 03/08/20 1139      PT SHORT TERM GOAL #1   Title independent with initial HEP    Status Achieved             PT Long Term Goals - 03/08/20 1140      PT LONG TERM GOAL #1   Title understand body mechanics    Status Partially Met                 Plan - 03/08/20 1140    Clinical Impression Statement Pt did well with a progression to machine level interventions. Pt has forward head and rounded shoulders, he reports a forward curve in cervical spine "Holding my head up is work". Cues given to reinforce good posture keeping shoulder back throughout session.  Discontinued thoracic ext on HEP due to subjective reports of pain.    Examination-Participation Restrictions Community Activity;Interpersonal Relationship    Stability/Clinical Decision Making Stable/Uncomplicated    Rehab Potential Good    PT Frequency 2x / week    PT Duration 6 weeks    PT Treatment/Interventions ADLs/Self Care Home Management;Electrical Stimulation;Iontophoresis 54m/ml Dexamethasone;Moist Heat;Therapeutic activities;Therapeutic exercise;Neuromuscular re-education;Patient/family education;Manual techniques;Passive range of motion;Taping    PT Next Visit Plan pec stretching, postural education and stability ex's, manual/modalities as indicated           Patient will benefit from skilled therapeutic intervention in order to improve the following deficits and impairments:  Increased muscle spasms,Impaired UE functional use,Pain,Impaired flexibility,Postural dysfunction  Visit Diagnosis: Acute pain of left shoulder  Pain in thoracic spine  Muscle weakness (generalized)     Problem List Patient Active Problem List   Diagnosis Date Noted  . Right knee meniscal tear 06/01/2015  . Obstructive sleep apnea 02/07/2015  . Thoracic spine pain 03/29/2014  . Tenosynovitis of thumb 03/29/2014  . Cervical radiculopathy due to degenerative joint disease of spine 10/10/2013  . Bursitis of left shoulder 09/23/2013  . Chronic low back pain 12/15/2012  . Chronic cervical pain 06/09/2012  . Hyperlipidemia 07/06/2010  . Essential hypertension 11/03/2009  . INGUINAL HERNIA, LEFT 11/03/2009  . Nonallergic rhinitis 08/24/2009  . VENTRAL HERNIA 05/20/2009  . DIVERTICULOSIS, COLON 05/20/2009  . NEVUS, ATYPICAL 10/26/2008  . GERD 10/01/2006    RScot Jun PTA 03/08/2020, 11:43 AM  [place] [address]Phone: [phone]  Fax:  [phone] Name: Todd NORKUSMRN: [id number] of  Birth: [birth year]

## 2020-03-10 ENCOUNTER — Encounter: Payer: Self-pay | Admitting: Physical Therapy

## 2020-03-10 ENCOUNTER — Ambulatory Visit: Payer: Medicare Other | Admitting: Physical Therapy

## 2020-03-10 ENCOUNTER — Other Ambulatory Visit: Payer: Self-pay

## 2020-03-10 DIAGNOSIS — M62830 Muscle spasm of back: Secondary | ICD-10-CM | POA: Diagnosis not present

## 2020-03-10 DIAGNOSIS — M6281 Muscle weakness (generalized): Secondary | ICD-10-CM | POA: Diagnosis not present

## 2020-03-10 DIAGNOSIS — M25612 Stiffness of left shoulder, not elsewhere classified: Secondary | ICD-10-CM | POA: Diagnosis not present

## 2020-03-10 DIAGNOSIS — M546 Pain in thoracic spine: Secondary | ICD-10-CM | POA: Diagnosis not present

## 2020-03-10 DIAGNOSIS — M25512 Pain in left shoulder: Secondary | ICD-10-CM

## 2020-03-10 NOTE — Therapy (Signed)
[address]. [place], [place], [address] Phone: [phone]   Fax:  [phone]  Physical Therapy Treatment  Patient Details  Name: Todd Larson MRN: [id number] Date of Birth: May 21, 1940 Referring Provider (PT): Marikay Alar Date: 03/10/2020   PT End of Session - 03/10/20 1010    Visit Number 3    Date for PT Re-Evaluation 05/01/20    PT Start Time 0930    PT Stop Time 1011    PT Time Calculation (min) 41 min    Activity Tolerance Patient tolerated treatment well    Behavior During Therapy [facility] for tasks assessed/performed           Past Medical History:  Diagnosis Date  . Arthritis    cervical spine - T7-8 & lumbar area - bone spurs up & down the back   . Bulging discs     thoracic spine  . Diverticulosis of colon   . GERD (gastroesophageal reflux disease)    related to intestinal system, - has started Align  4-5 days ago-   . Hyperlipidemia   . Hypertension   . Inguinal hernia   . OSA (obstructive sleep apnea)    saw Dr. Annamaria Boots- 02/2015- pt. remarks that as a result BP med. was changed , pt. has a sleep number bed & he reports the score averages in the 80% range   . PSA elevation    History of   . Recurrent sinus infections    related to fracture nose x2, also reports that he has a deviated septum    Past Surgical History:  Procedure Laterality Date  . COLONOSCOPY  2008   3 internal hemorrhoids, diverticulosis;Dr.Jacobs  . INSERTION OF MESH N/A 11/13/2016   Procedure: INSERTION OF MESH;  Surgeon: Ralene Ok, MD;  Location: [facility];  Service: General;  Laterality: N/A;  . LAPAROSCOPIC INGUINAL HERNIA WITH UMBILICAL HERNIA Left 09/62/8366   Procedure: LAPAROSCOPIC LEFT INGUINAL HERNIA  AND UMBILICAL HERNIA REPAIRS WITH MESH;  Surgeon: Ralene Ok, MD;  Location: [facility];  Service: General;  Laterality: Left;  . PROSTATE BIOPSY     x 2, once abnormal, once normal  . TONSILLECTOMY       There were no vitals filed for this visit.   Subjective Assessment - 03/10/20 0929    Subjective Not too bad, some back pain in the morning but it wears off    Currently in Pain? Yes    Pain Score 2     Pain Location Back    Pain Orientation Mid                             [facility] Adult PT Treatment/Exercise - 03/10/20 0001      Shoulder Exercises: Seated   Other Seated Exercises cervical retraction x10      Shoulder Exercises: Standing   External Rotation Strengthening;Both;Theraband;20 reps    Theraband Level (Shoulder External Rotation) Level 3 (Green)    Flexion Weights;20 reps;Both;Strengthening    Shoulder Flexion Weight (lbs) 4    ABduction Weights;20 reps;Both;Strengthening    Shoulder ABduction Weight (lbs) 4    Extension Both;Strengthening;Weights;15 reps   x2   Theraband Level (Shoulder Extension) Level 3 (Green)    Row Theraband;Both;Strengthening;15 reps   x2   Theraband Level (Shoulder Row) Level 3 (Green)    Other Standing Exercises Tricep Ext 25lb 3x10      Shoulder  Exercises: ROM/Strengthening   UBE (Upper Arm Bike) L 2.2 x3 mine each way    Nustep L5 x5    Other ROM/Strengthening Exercises Rows & Lats 35lb 2x10      Shoulder Exercises: Stretch   Corner Stretch 3 reps;10 seconds                    PT Short Term Goals - 03/08/20 1139      PT SHORT TERM GOAL #1   Title independent with initial HEP    Status Achieved             PT Long Term Goals - 03/08/20 1140      PT LONG TERM GOAL #1   Title understand body mechanics    Status Partially Met                 Plan - 03/10/20 1011    Clinical Impression Statement More intense session today's. He reports "I can feel it" when doing the seated rows. Increase reps and or resistance tolerated with all interventions. Some discomfort noted with cervical retractions.    Examination-Participation Restrictions Community Activity;Interpersonal Relationship     Stability/Clinical Decision Making Stable/Uncomplicated    Rehab Potential Good    PT Frequency 2x / week    PT Duration 6 weeks    PT Treatment/Interventions ADLs/Self Care Home Management;Electrical Stimulation;Iontophoresis 67m/ml Dexamethasone;Moist Heat;Therapeutic activities;Therapeutic exercise;Neuromuscular re-education;Patient/family education;Manual techniques;Passive range of motion;Taping    PT Next Visit Plan pec stretching, postural education and stability ex's, manual/modalities as indicated           Patient will benefit from skilled therapeutic intervention in order to improve the following deficits and impairments:  Increased muscle spasms,Impaired UE functional use,Pain,Impaired flexibility,Postural dysfunction  Visit Diagnosis: Pain in thoracic spine  Muscle weakness (generalized)  Muscle spasm of back  Stiffness of left shoulder, not elsewhere classified  Acute pain of left shoulder     Problem List Patient Active Problem List   Diagnosis Date Noted  . Right knee meniscal tear 06/01/2015  . Obstructive sleep apnea 02/07/2015  . Thoracic spine pain 03/29/2014  . Tenosynovitis of thumb 03/29/2014  . Cervical radiculopathy due to degenerative joint disease of spine 10/10/2013  . Bursitis of left shoulder 09/23/2013  . Chronic low back pain 12/15/2012  . Chronic cervical pain 06/09/2012  . Hyperlipidemia 07/06/2010  . Essential hypertension 11/03/2009  . INGUINAL HERNIA, LEFT 11/03/2009  . Nonallergic rhinitis 08/24/2009  . VENTRAL HERNIA 05/20/2009  . DIVERTICULOSIS, COLON 05/20/2009  . NEVUS, ATYPICAL 10/26/2008  . GERD 10/01/2006    RScot Jun3/10/2020, 10:14 AM  [place] [address]Phone: [phone]  Fax:  [phone] Name: Todd Larson: [id number] of Birth: [birth year]

## 2020-03-15 ENCOUNTER — Other Ambulatory Visit: Payer: Self-pay

## 2020-03-15 ENCOUNTER — Encounter: Payer: Self-pay | Admitting: Physical Therapy

## 2020-03-15 ENCOUNTER — Ambulatory Visit: Payer: Medicare Other | Admitting: Physical Therapy

## 2020-03-15 DIAGNOSIS — M6281 Muscle weakness (generalized): Secondary | ICD-10-CM | POA: Diagnosis not present

## 2020-03-15 DIAGNOSIS — M62830 Muscle spasm of back: Secondary | ICD-10-CM

## 2020-03-15 DIAGNOSIS — M25612 Stiffness of left shoulder, not elsewhere classified: Secondary | ICD-10-CM | POA: Diagnosis not present

## 2020-03-15 DIAGNOSIS — M546 Pain in thoracic spine: Secondary | ICD-10-CM | POA: Diagnosis not present

## 2020-03-15 DIAGNOSIS — M25512 Pain in left shoulder: Secondary | ICD-10-CM

## 2020-03-15 NOTE — Therapy (Signed)
[address]. [place], [place], [address] Phone: [phone]   Fax:  [phone]  Physical Therapy Treatment  Patient Details  Name: Todd Larson MRN: [id number] Date of Birth: Aug 18, 1940 Referring Provider (PT): Marikay Alar Date: 03/15/2020   PT End of Session - 03/15/20 1127    Visit Number 4    Date for PT Re-Evaluation 05/01/20    PT Start Time 1050    PT Stop Time 1130    PT Time Calculation (min) 40 min    Activity Tolerance Patient tolerated treatment well    Behavior During Therapy [facility] for tasks assessed/performed           Past Medical History:  Diagnosis Date  . Arthritis    cervical spine - T7-8 & lumbar area - bone spurs up & down the back   . Bulging discs     thoracic spine  . Diverticulosis of colon   . GERD (gastroesophageal reflux disease)    related to intestinal system, - has started Align  4-5 days ago-   . Hyperlipidemia   . Hypertension   . Inguinal hernia   . OSA (obstructive sleep apnea)    saw Dr. Annamaria Boots- 02/2015- pt. remarks that as a result BP med. was changed , pt. has a sleep number bed & he reports the score averages in the 80% range   . PSA elevation    History of   . Recurrent sinus infections    related to fracture nose x2, also reports that he has a deviated septum    Past Surgical History:  Procedure Laterality Date  . COLONOSCOPY  2008   3 internal hemorrhoids, diverticulosis;Dr.Jacobs  . INSERTION OF MESH N/A 11/13/2016   Procedure: INSERTION OF MESH;  Surgeon: Ralene Ok, MD;  Location: [facility];  Service: General;  Laterality: N/A;  . LAPAROSCOPIC INGUINAL HERNIA WITH UMBILICAL HERNIA Left 10/62/6948   Procedure: LAPAROSCOPIC LEFT INGUINAL HERNIA  AND UMBILICAL HERNIA REPAIRS WITH MESH;  Surgeon: Ralene Ok, MD;  Location: [facility];  Service: General;  Laterality: Left;  . PROSTATE BIOPSY     x 2, once abnormal, once normal  . TONSILLECTOMY       There were no vitals filed for this visit.   Subjective Assessment - 03/15/20 1048    Subjective Not too bad, some back pain in the morning but it wears off    Currently in Pain? Yes    Pain Score 2     Pain Location Shoulder    Pain Orientation Left                             [facility] Adult PT Treatment/Exercise - 03/15/20 0001      Shoulder Exercises: Standing   External Rotation Strengthening;Both;Theraband;20 reps    Theraband Level (Shoulder External Rotation) Level 3 (Green)    Internal Rotation Strengthening;Both;20 reps    Theraband Level (Shoulder Internal Rotation) Level 3 (Green)    Flexion Weights;20 reps;Both;Strengthening    Shoulder Flexion Weight (lbs) 4    ABduction Weights;20 reps;Both;Strengthening    Shoulder ABduction Weight (lbs) 4    Extension Both;Strengthening;Weights;15 reps    Theraband Level (Shoulder Extension) Level 3 (Green)   2x15   Row Theraband;Both;Strengthening;15 reps   2x15   Theraband Level (Shoulder Row) Level 3 (Green)    Other Standing Exercises Tricep Ext 25lb 3x10    Other  Standing Exercises red scap stab 3 ways red TB x5 B      Shoulder Exercises: ROM/Strengthening   UBE (Upper Arm Bike) L2.2 3 min each    Nustep L5 x 6 min    Other ROM/Strengthening Exercises Rows & Lats 35lb 2x10      Shoulder Exercises: Stretch   Corner Stretch 2 reps;20 seconds                    PT Short Term Goals - 03/08/20 1139      PT SHORT TERM GOAL #1   Title independent with initial HEP    Status Achieved             PT Long Term Goals - 03/15/20 1129      PT LONG TERM GOAL #1   Title understand body mechanics    Status Partially Met      PT LONG TERM GOAL #2   Title decrease L shoulder pain 50%    Status Partially Met      PT LONG TERM GOAL #3   Title Pt will demo I with advanced HEP    Status On-going                 Plan - 03/15/20 1127    Clinical Impression Statement Pt tolerated  progression of TE well; c/o of mild increased L shoulder pain with standing shoulder IR/ER alleviated with pec stretching. Requires cues for form with standing ex's to avoid compensation. Continue to progress strengthening and scap stab.    PT Treatment/Interventions ADLs/Self Care Home Management;Electrical Stimulation;Iontophoresis 4mg/ml Dexamethasone;Moist Heat;Therapeutic activities;Therapeutic exercise;Neuromuscular re-education;Patient/family education;Manual techniques;Passive range of motion;Taping    PT Next Visit Plan pec stretching, postural education and stability ex's, manual/modalities as indicated           Patient will benefit from skilled therapeutic intervention in order to improve the following deficits and impairments:     Visit Diagnosis: Pain in thoracic spine  Muscle weakness (generalized)  Muscle spasm of back  Stiffness of left shoulder, not elsewhere classified  Acute pain of left shoulder     Problem List Patient Active Problem List   Diagnosis Date Noted  . Right knee meniscal tear 06/01/2015  . Obstructive sleep apnea 02/07/2015  . Thoracic spine pain 03/29/2014  . Tenosynovitis of thumb 03/29/2014  . Cervical radiculopathy due to degenerative joint disease of spine 10/10/2013  . Bursitis of left shoulder 09/23/2013  . Chronic low back pain 12/15/2012  . Chronic cervical pain 06/09/2012  . Hyperlipidemia 07/06/2010  . Essential hypertension 11/03/2009  . INGUINAL HERNIA, LEFT 11/03/2009  . Nonallergic rhinitis 08/24/2009  . VENTRAL HERNIA 05/20/2009  . DIVERTICULOSIS, COLON 05/20/2009  . NEVUS, ATYPICAL 10/26/2008  . GERD 10/01/2006   Amanda Sugg, PT, DPT Amanda M Sugg 03/15/2020, 11:30 AM  [facility] [address] , [place], [address] Phone: [phone]   Fax:  [phone]  Name: Todd Larson MRN: [id number] Date of Birth: 07/31/1940   

## 2020-03-17 ENCOUNTER — Other Ambulatory Visit: Payer: Self-pay

## 2020-03-17 ENCOUNTER — Ambulatory Visit: Payer: Medicare Other | Admitting: Physical Therapy

## 2020-03-17 ENCOUNTER — Encounter: Payer: Self-pay | Admitting: Physical Therapy

## 2020-03-17 DIAGNOSIS — M25612 Stiffness of left shoulder, not elsewhere classified: Secondary | ICD-10-CM | POA: Diagnosis not present

## 2020-03-17 DIAGNOSIS — M62830 Muscle spasm of back: Secondary | ICD-10-CM | POA: Diagnosis not present

## 2020-03-17 DIAGNOSIS — M25512 Pain in left shoulder: Secondary | ICD-10-CM | POA: Diagnosis not present

## 2020-03-17 DIAGNOSIS — M6281 Muscle weakness (generalized): Secondary | ICD-10-CM | POA: Diagnosis not present

## 2020-03-17 DIAGNOSIS — M546 Pain in thoracic spine: Secondary | ICD-10-CM | POA: Diagnosis not present

## 2020-03-17 NOTE — Therapy (Signed)
[address]. [place], [place], [address] Phone: [phone]   Fax:  [phone]  Physical Therapy Treatment  Patient Details  Name: Todd Larson MRN: [id number] Date of Birth: [birth year] Referring Provider (PT): Marikay Alar Date: 03/17/2020   PT End of Session - 03/17/20 1001    Visit Number 5    Date for PT Re-Evaluation 05/01/20    PT Start Time 0921    PT Stop Time 1005    PT Time Calculation (min) 44 min    Activity Tolerance Patient tolerated treatment well    Behavior During Therapy [facility] for tasks assessed/performed           Past Medical History:  Diagnosis Date  . Arthritis    cervical spine - T7-8 & lumbar area - bone spurs up & down the back   . Bulging discs     thoracic spine  . Diverticulosis of colon   . GERD (gastroesophageal reflux disease)    related to intestinal system, - has started Align  4-5 days ago-   . Hyperlipidemia   . Hypertension   . Inguinal hernia   . OSA (obstructive sleep apnea)    saw Dr. Annamaria Boots- 02/2015- pt. remarks that as a result BP med. was changed , pt. has a sleep number bed & he reports the score averages in the 80% range   . PSA elevation    History of   . Recurrent sinus infections    related to fracture nose x2, also reports that he has a deviated septum    Past Surgical History:  Procedure Laterality Date  . COLONOSCOPY  2008   3 internal hemorrhoids, diverticulosis;Dr.Jacobs  . INSERTION OF MESH N/A 11/13/2016   Procedure: INSERTION OF MESH;  Surgeon: Ralene Ok, MD;  Location: [facility];  Service: General;  Laterality: N/A;  . LAPAROSCOPIC INGUINAL HERNIA WITH UMBILICAL HERNIA Left 09/32/6712   Procedure: LAPAROSCOPIC LEFT INGUINAL HERNIA  AND UMBILICAL HERNIA REPAIRS WITH MESH;  Surgeon: Ralene Ok, MD;  Location: [facility];  Service: General;  Laterality: Left;  . PROSTATE BIOPSY     x 2, once abnormal, once normal  . TONSILLECTOMY       There were no vitals filed for this visit.   Subjective Assessment - 03/17/20 0921    Subjective Pt enters clinic reporting two instances of sharp L shoulder pain, one getting out of a chair the wrong way and getting down to cleanup after his cat. Pt also reported some R knee pain.    Currently in Pain? Yes    Pain Score 2     Pain Location Shoulder    Pain Orientation Left                             [facility] Adult PT Treatment/Exercise - 03/17/20 0001      Shoulder Exercises: Standing   Horizontal ABduction Theraband;20 reps;Both;Other (comment)   1/2 foam roll against wall   Theraband Level (Shoulder Horizontal ABduction) Level 3 (Green)    External Rotation Strengthening;Both;Theraband;20 reps   .5 foam roll against wall   Theraband Level (Shoulder External Rotation) Level 3 (Green)    Internal Rotation Strengthening;Both;20 reps    Theraband Level (Shoulder Internal Rotation) Level 3 (Green)    Flexion Weights;20 reps;Both;Strengthening    Shoulder Flexion Weight (lbs) 4    ABduction Weights;20 reps;Both;Strengthening    Shoulder ABduction  Weight (lbs) 4    Other Standing Exercises LUE D2 flexion yellow band x5 stopped due to pain      Shoulder Exercises: ROM/Strengthening   UBE (Upper Arm Bike) L2.2 3 min each    Other ROM/Strengthening Exercises Rows & Lats 35lb 2x10      Shoulder Exercises: Stretch   Corner Stretch 5 reps   15 seconds                   PT Short Term Goals - 03/08/20 1139      PT SHORT TERM GOAL #1   Title independent with initial HEP    Status Achieved             PT Long Term Goals - 03/15/20 1129      PT LONG TERM GOAL #1   Title understand body mechanics    Status Partially Met      PT LONG TERM GOAL #2   Title decrease L shoulder pain 50%    Status Partially Met      PT LONG TERM GOAL #3   Title Pt will demo I with advanced HEP    Status On-going                 Plan - 03/17/20 1001     Clinical Impression Statement Pt reports two instances of pain below the L clavicle into pec, both during weight bearing activities. The pain was present with LUE D2 flexion having to discontinue the interventions. Both weight bearing and D2 flexion cause AC Jt movement, X-rays show arthritis at [place]. Postural cues given throughout session. Pt as forward head and rounded shoulders. 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    Examination-Participation Restrictions Community Activity;Interpersonal Relationship    Stability/Clinical Decision Making Stable/Uncomplicated    Rehab Potential Good    PT Frequency 2x / week    PT Duration 6 weeks    PT Treatment/Interventions ADLs/Self Care Home Management;Electrical Stimulation;Iontophoresis 22m/ml Dexamethasone;Moist Heat;Therapeutic activities;Therapeutic exercise;Neuromuscular re-education;Patient/family education;Manual techniques;Passive range of motion;Taping    PT Next Visit Plan pec stretching, postural education and stability ex's, manual/modalities as indicated, GET REPORT FROM MD          Patient will benefit from skilled therapeutic intervention in order to improve the following deficits and impairments:  Increased muscle spasms,Impaired UE functional use,Pain,Impaired flexibility,Postural dysfunction  Visit Diagnosis: Muscle weakness (generalized)  Muscle spasm of back  Pain in thoracic spine     Problem List Patient Active Problem List   Diagnosis Date Noted  . Right knee meniscal tear 06/01/2015  . Obstructive sleep apnea 02/07/2015  . Thoracic spine pain 03/29/2014  . Tenosynovitis of thumb 03/29/2014  . Cervical radiculopathy due to degenerative joint disease of spine 10/10/2013  . Bursitis of left shoulder 09/23/2013  . Chronic low back pain 12/15/2012  . Chronic cervical pain 06/09/2012  . Hyperlipidemia 07/06/2010  . Essential hypertension 11/03/2009  . INGUINAL HERNIA, LEFT 11/03/2009   . Nonallergic rhinitis 08/24/2009  . VENTRAL HERNIA 05/20/2009  . DIVERTICULOSIS, COLON 05/20/2009  . NEVUS, ATYPICAL 10/26/2008  . GERD 10/01/2006    RScot Jun PTA 03/17/2020, 10:08 AM  [place] [address]Phone: [phone]  Fax:  [phone] Name: PTAYVON CULLEYMRN: [id number] of Birth: 710/16/1942

## 2020-03-21 NOTE — Progress Notes (Signed)
I, Todd Larson, LAT, ATC, am serving as scribe for Dr. Lynne Larson.  Todd Larson is a 80 y.o. male who presents to [place] [facility] today for f/u of L ant shoulder/chest pain.  He was last seen by Dr. Georgina Larson on 02/23/20 and was referred to PT of which he's completed 5 visits.  Since his last visit, pt reports L-side chest is still bothering him. Pt locates pain to L pectoralis muscles w/ sporadic pain. Pt reports there is a "lump" over pec muscle.  Diagnostic imaging: L shoulder XR- 02/23/20; T-spine XR- 01/07/19  Pertinent review of systems: No fevers or chills  Relevant historical information: Hypertension   Exam:  BP (!) 141/77 (BP Location: Right Arm, Patient Position: Sitting, Cuff Size: Normal)   Pulse 90   Ht 5\' 8"  (1.727 m)   Wt 231 lb 6.4 oz (105 kg)   SpO2 97%   BMI 35.18 kg/m  General: Well Developed, well nourished, and in no acute distress.   MSK: Left shoulder normal-appearing decreased shoulder motion. Left chest wall slightly more prominent than right.  No discrete palpable mass although left chest wall is larger than left.  Mildly tender palpation.    Lab and Radiology Results  Procedure: Real-time Ultrasound Guided Injection of left glenohumeral joint posterior approach Device: Philips Affiniti 50G Images permanently stored and available for review in PACS Verbal informed consent obtained.  Discussed risks and benefits of procedure. Warned about infection bleeding damage to structures skin hypopigmentation and fat atrophy among others. Patient expresses understanding and agreement Time-out conducted.   Noted no overlying erythema, induration, or other signs of local infection.   Skin prepped in a sterile fashion.   Local anesthesia: Topical Ethyl chloride.   With sterile technique and under real time ultrasound guidance:  40 mg of Kenalog and 2 mL of Marcaine injected into the glenohumeral joint. Fluid seen entering the joint capsule.    Completed without difficulty   Pain in pectoralis did not significantly resolve immediately following the injection. Advised to call if fevers/chills, erythema, induration, drainage, or persistent bleeding.   Images permanently stored and available for review in the ultrasound unit.  Impression: Technically successful ultrasound guided injection.         Assessment and Plan: 80 y.o. male with right left chest wall pain in the setting of shoulder arthritis.  Is unclear as the main cause of pain.  He may have a true pectoralis injury although this is less likely.  Trial of glenohumeral injection in clinic today.  We will see how he does over the next week or so.  If not better likely will proceed with MRI of shoulder and pectoralis muscle (likely chest will confirm with radiology prior to order).   Patient will send me an update in about a week.   PDMP not reviewed this encounter. Orders Placed This Encounter  Procedures  . [place] LIMITED JOINT SPACE STRUCTURES UP LEFT(NO LINKED CHARGES)    Standing Status:   Future    Number of Occurrences:   1    Standing Expiration Date:   09/22/2020    Order Specific Question:   Reason for Exam (SYMPTOM  OR DIAGNOSIS REQUIRED)    Answer:   left shoulder pain    Order Specific Question:   Preferred imaging location?    Answer:   [place]   No orders of the defined types were placed in this encounter.    Discussed warning  signs or symptoms. Please see discharge instructions. Patient expresses understanding.   The above documentation has been reviewed and is accurate and complete Todd Larson, M.D.

## 2020-03-22 ENCOUNTER — Other Ambulatory Visit: Payer: Self-pay | Admitting: Adult Health

## 2020-03-22 ENCOUNTER — Ambulatory Visit: Payer: Self-pay

## 2020-03-22 ENCOUNTER — Ambulatory Visit (INDEPENDENT_AMBULATORY_CARE_PROVIDER_SITE_OTHER): Payer: Medicare Other | Admitting: Family Medicine

## 2020-03-22 ENCOUNTER — Other Ambulatory Visit: Payer: Self-pay

## 2020-03-22 VITALS — BP 141/77 | HR 90 | Ht 68.0 in | Wt 231.4 lb

## 2020-03-22 DIAGNOSIS — I10 Essential (primary) hypertension: Secondary | ICD-10-CM

## 2020-03-22 DIAGNOSIS — M25512 Pain in left shoulder: Secondary | ICD-10-CM

## 2020-03-22 DIAGNOSIS — G8929 Other chronic pain: Secondary | ICD-10-CM | POA: Diagnosis not present

## 2020-03-22 NOTE — Telephone Encounter (Signed)
Can we see if his pharmacy has the 100 mg tabs of Losartan and we can just have him cut his pills in half until they get the 50 mg in stock

## 2020-03-22 NOTE — Telephone Encounter (Signed)
Pharmacy is requesting a replacement for Losartan 50mg .

## 2020-03-22 NOTE — Patient Instructions (Signed)
Thank you for coming in today.  Call or go to the ER if you develop a large red swollen joint with extreme pain or oozing puss.   If no better with the shot let me know and I will proceed to MRI of the shoulder and pectoralis muscle.

## 2020-03-22 NOTE — Telephone Encounter (Signed)
Spoke with the pharmacy. They do have 50mg  of Losartan in stock as well as the 100mg . The medication the do not have is the 50mg  of Diovan. Please advise.

## 2020-03-24 ENCOUNTER — Encounter: Payer: Self-pay | Admitting: Family Medicine

## 2020-03-24 DIAGNOSIS — M25512 Pain in left shoulder: Secondary | ICD-10-CM

## 2020-03-24 DIAGNOSIS — G8929 Other chronic pain: Secondary | ICD-10-CM

## 2020-03-24 DIAGNOSIS — M7582 Other shoulder lesions, left shoulder: Secondary | ICD-10-CM

## 2020-03-29 ENCOUNTER — Ambulatory Visit: Payer: Medicare Other | Admitting: Physical Therapy

## 2020-03-31 ENCOUNTER — Ambulatory Visit: Payer: Medicare Other | Admitting: Physical Therapy

## 2020-04-05 ENCOUNTER — Ambulatory Visit: Payer: Medicare Other | Admitting: Physical Therapy

## 2020-04-07 ENCOUNTER — Ambulatory Visit: Payer: Medicare Other | Admitting: Physical Therapy

## 2020-04-10 ENCOUNTER — Ambulatory Visit (INDEPENDENT_AMBULATORY_CARE_PROVIDER_SITE_OTHER): Payer: Medicare Other

## 2020-04-10 ENCOUNTER — Other Ambulatory Visit: Payer: Self-pay

## 2020-04-10 DIAGNOSIS — M19012 Primary osteoarthritis, left shoulder: Secondary | ICD-10-CM | POA: Diagnosis not present

## 2020-04-10 DIAGNOSIS — R079 Chest pain, unspecified: Secondary | ICD-10-CM | POA: Diagnosis not present

## 2020-04-10 DIAGNOSIS — M25512 Pain in left shoulder: Secondary | ICD-10-CM

## 2020-04-10 DIAGNOSIS — G8929 Other chronic pain: Secondary | ICD-10-CM

## 2020-04-10 DIAGNOSIS — M7582 Other shoulder lesions, left shoulder: Secondary | ICD-10-CM

## 2020-04-12 NOTE — Progress Notes (Signed)
Left shoulder MRI shows some rotator cuff tendinitis and arthritis of the small joint of the top of the shoulder.  The pectoralis tendon visible in the chest wall is normal-appearing.  Please schedule follow-up appointment to review these findings in full detail and discuss next steps in treatment plan.

## 2020-04-12 NOTE — Progress Notes (Signed)
Chest MRI does not show any significant problems of the pectoralis muscle.  The chest wall looks normal to radiology.

## 2020-04-14 DIAGNOSIS — N138 Other obstructive and reflux uropathy: Secondary | ICD-10-CM | POA: Diagnosis not present

## 2020-04-14 DIAGNOSIS — N401 Enlarged prostate with lower urinary tract symptoms: Secondary | ICD-10-CM | POA: Diagnosis not present

## 2020-04-14 NOTE — Progress Notes (Signed)
I, [name], LAT, ATC, am serving as scribe for Dr. Lynne Leader.  Todd Larson is a 80 y.o. male who presents to [place] at [facility] today for f/u of L shoulder/ant chest pain and to review his L shoulder and chest MRIs.  He was last seen by Dr. Georgina Snell on 03/22/20 and reported con't L chest pain w/ a palpable lump over his pec muscle.  He had a L GHJ injection and was referred for an MRI of his L shoulder and chest.  Since his last visit, pt reports that his L shoulder pain is intermittent and sporadic in nature.  He states that his L shoulder will intermittently "drop" and he is noticing when his L shoulder drops, this is when he gets the chest and sternal pain.  Aggravating factors include rounding his mid-back and collapsing his ant chest.  Diagnostic testing: L shoulder and chest MRI- 04/10/20; L shoulder XR- 02/23/20;   Pertinent review of systems: No fevers or chills  Relevant historical information: Hypertension   Exam:  BP 138/70 (BP Location: Right Arm, Patient Position: Sitting, Cuff Size: Normal)   Pulse 100   Ht 5\' 8"  (1.727 m)   Wt 230 lb 3.2 oz (104.4 kg)   SpO2 97%   BMI 35.00 kg/m  General: Well Developed, well nourished, and in no acute distress.   MSK: Left shoulder normal-appearing Mildly tender palpation AC joint. Normal shoulder motion.    Lab and Radiology Results Procedure: Real-time Ultrasound Guided Injection of left shoulder AC joint Device: Philips Affiniti 50G Images permanently stored and available for review in PACS Verbal informed consent obtained.  Discussed risks and benefits of procedure. Warned about infection bleeding damage to structures skin hypopigmentation and fat atrophy among others. Patient expresses understanding and agreement Time-out conducted.   Noted no overlying erythema, induration, or other signs of local infection.   Skin prepped in a sterile fashion.   Local anesthesia: Topical Ethyl chloride.   With  sterile technique and under real time ultrasound guidance:  20 mg of Kenalog and 0.5 mL of Marcaine injected into AC joint. Fluid seen entering the joint capsule.   Completed without difficulty   Pain immediately resolved suggesting accurate placement of the medication.   Advised to call if fevers/chills, erythema, induration, drainage, or persistent bleeding.   Images permanently stored and available for review in the ultrasound unit.  Impression: Technically successful ultrasound guided injection.  EXAM: MRI OF THE LEFT SHOULDER WITHOUT CONTRAST  TECHNIQUE: Multiplanar, multisequence MR imaging of the shoulder was performed. No intravenous contrast was administered.  COMPARISON:  None.  FINDINGS: Rotator cuff: Intact. There is mild appearing supraspinatus and infraspinatus tendinopathy.  Muscles:  Normal without atrophy or focal lesion.  Biceps long head:  Intact.  Acromioclavicular Joint: Moderate osteoarthritis. Type II acromion. No subacromial/subdeltoid bursal fluid. Small subacromial spur noted.  Glenohumeral Joint: Appears normal.  Labrum:  Intact.  Bones:  Negative.  Other: None.  IMPRESSION: Mild appearing supraspinatus and infraspinatus tendinopathy without tear.  Moderate acromioclavicular osteoarthritis.   Electronically Signed   By: Inge Rise M.D.   On: 04/11/2020 13:35  I, Lynne Leader, personally (independently) visualized and performed the interpretation of the images attached in this note.   Assessment and Plan: 80 y.o. male with multifactorial left shoulder and chest wall pain.  Patient has several potential causes of pain including AC joint DJD, and rotator cuff tendinitis.  This is probably some of the pain.  However I  do believe he also has shoulder girdle muscle dysfunction and pathology that is also contributory.  Plan for trial of ACE injection today.  He had previous trial of glenohumeral injection which did not help.   Additionally continue home exercise program previously taught in PT and reassess in about 6 weeks.  Could consider subacromial injection in the future as well.   PDMP not reviewed this encounter. Orders Placed This Encounter  Procedures  . [place] LIMITED JOINT SPACE STRUCTURES UP LEFT(NO LINKED CHARGES)    Order Specific Question:   Reason for Exam (SYMPTOM  OR DIAGNOSIS REQUIRED)    Answer:   L shoulder pain    Order Specific Question:   Preferred imaging location?    Answer:   [place]   No orders of the defined types were placed in this encounter.    Discussed warning signs or symptoms. Please see discharge instructions. Patient expresses understanding.   The above documentation has been reviewed and is accurate and complete Lynne Leader, M.D.  Total encounter time 30 minutes including face-to-face time with the patient and, reviewing past medical record, and charting on the date of service.   Discussion treatment plan and options and MRI findings.  Time excludes time to perform injection.

## 2020-04-18 ENCOUNTER — Ambulatory Visit (INDEPENDENT_AMBULATORY_CARE_PROVIDER_SITE_OTHER): Payer: Medicare Other | Admitting: Family Medicine

## 2020-04-18 ENCOUNTER — Ambulatory Visit: Payer: Self-pay

## 2020-04-18 ENCOUNTER — Other Ambulatory Visit: Payer: Self-pay

## 2020-04-18 ENCOUNTER — Encounter: Payer: Self-pay | Admitting: Family Medicine

## 2020-04-18 VITALS — BP 138/70 | HR 100 | Ht 68.0 in | Wt 230.2 lb

## 2020-04-18 DIAGNOSIS — G8929 Other chronic pain: Secondary | ICD-10-CM

## 2020-04-18 DIAGNOSIS — M25512 Pain in left shoulder: Secondary | ICD-10-CM | POA: Diagnosis not present

## 2020-04-18 NOTE — Patient Instructions (Addendum)
Thank you for coming in today.   You had a L ACJ injection today.  Call or go to the ER if you develop a large red swollen joint with extreme pain or oozing puss.   Keep me updated.   Recheck in about 6 weeks.

## 2020-05-31 ENCOUNTER — Ambulatory Visit: Payer: Medicare Other | Admitting: Family Medicine

## 2020-06-09 ENCOUNTER — Ambulatory Visit: Payer: Medicare Other

## 2020-06-09 ENCOUNTER — Other Ambulatory Visit (HOSPITAL_BASED_OUTPATIENT_CLINIC_OR_DEPARTMENT_OTHER): Payer: Self-pay

## 2020-06-09 DIAGNOSIS — Z23 Encounter for immunization: Secondary | ICD-10-CM | POA: Diagnosis not present

## 2020-06-09 MED ORDER — COVID-19 MRNA VAC-TRIS(PFIZER) 30 MCG/0.3ML IM SUSP
INTRAMUSCULAR | 0 refills | Status: DC
  Filled 2020-06-09: qty 0.3, 1d supply, fill #0

## 2020-06-09 NOTE — Progress Notes (Signed)
   Covid-19 Vaccination Clinic  Name:  Todd Larson    MRN: [id number] DOB: 1940/09/23  06/09/2020  [name] was observed post Covid-19 immunization for 15 minutes without incident. He was provided with Vaccine Information Sheet and instruction to access the V-Safe system.   [name] was instructed to call 911 with any severe reactions post vaccine: Difficulty breathing  Swelling of face and throat  A fast heartbeat  A bad rash all over body  Dizziness and weakness

## 2020-06-13 ENCOUNTER — Other Ambulatory Visit: Payer: Self-pay

## 2020-06-13 ENCOUNTER — Telehealth (INDEPENDENT_AMBULATORY_CARE_PROVIDER_SITE_OTHER): Payer: Medicare Other | Admitting: Family Medicine

## 2020-06-13 DIAGNOSIS — J019 Acute sinusitis, unspecified: Secondary | ICD-10-CM | POA: Diagnosis not present

## 2020-06-13 MED ORDER — AMOXICILLIN 500 MG PO CAPS: 500.0000 mg | ORAL_CAPSULE | Freq: Three times a day (TID) | ORAL | 0 refills | Status: AC

## 2020-06-13 NOTE — Progress Notes (Signed)
Patient ID: Todd Larson, male   DOB: July 24, 1940, 80 y.o.   MRN: [id number]  This visit type was conducted due to national recommendations for restrictions regarding the COVID-19 pandemic in an effort to limit this patient's exposure and mitigate transmission in our community.   Virtual Visit via Video Note  I connected with Todd Larson on 06/13/20 at  3:30 PM EDT by a video enabled telemedicine application and verified that I am speaking with the correct person using two identifiers.  Location patient: home Location provider:work or home office Persons participating in the virtual visit: patient, provider  I discussed the limitations of evaluation and management by telemedicine and the availability of in person appointments. The patient expressed understanding and agreed to proceed.   HPI:  Todd Larson called with progressive nasal congestion and some brownish nasal discharge past several weeks.  He states he has nasal congestion "all the time".  He has cats which go outdoors frequently and bring in allergens.  He is taken Flonase in the past without much improvement.  He is recently had some slight decline in his O2 sats.  COVID test yesterday which was negative.  No coughing.  No fever.  No body aches.  No headaches.   ROS: See pertinent positives and negatives per HPI.  Past Medical History:  Diagnosis Date   Arthritis    cervical spine - T7-8 & lumbar area - bone spurs up & down the back    Bulging discs     thoracic spine   Diverticulosis of colon    GERD (gastroesophageal reflux disease)    related to intestinal system, - has started Align  4-5 days ago-    Hyperlipidemia    Hypertension    Inguinal hernia    OSA (obstructive sleep apnea)    saw Dr. Annamaria Boots- 02/2015- pt. remarks that as a result BP med. was changed , pt. has a sleep number bed & he reports the score averages in the 80% range    PSA elevation    History of    Recurrent sinus infections    related to fracture nose x2,  also reports that he has a deviated septum    Past Surgical History:  Procedure Laterality Date   COLONOSCOPY  2008   3 internal hemorrhoids, diverticulosis;Dr.Jacobs   INSERTION OF MESH N/A 11/13/2016   Procedure: INSERTION OF MESH;  Surgeon: Ralene Ok, MD;  Location: [place];  Service: General;  Laterality: N/A;   LAPAROSCOPIC INGUINAL HERNIA WITH UMBILICAL HERNIA Left 25/05/3974   Procedure: LAPAROSCOPIC LEFT INGUINAL HERNIA  AND UMBILICAL HERNIA REPAIRS WITH MESH;  Surgeon: Ralene Ok, MD;  Location: [place];  Service: General;  Laterality: Left;   PROSTATE BIOPSY     x 2, once abnormal, once normal   TONSILLECTOMY      Family History  Problem Relation Age of Onset   Lung cancer Maternal Grandfather        enviromental    Heart attack Paternal Uncle    Prostate cancer Brother     SOCIAL HX: Non-smoker   Current Outpatient Medications:    amoxicillin (AMOXIL) 500 MG capsule, Take 1 capsule (500 mg total) by mouth 3 (three) times daily for 10 days., Disp: 30 capsule, Rfl: 0   COVID-19 mRNA Vac-TriS, Pfizer, SUSP injection, Inject into the muscle., Disp: 0.3 mL, Rfl: 0   cyclobenzaprine (FLEXERIL) 10 MG tablet, Take 1 tablet (10 mg total) by mouth 3 (three) times daily as needed for muscle  spasms. (Patient not taking: No sig reported), Disp: 30 tablet, Rfl: 0   finasteride (PROSCAR) 5 MG tablet, Take 5 mg by mouth daily., Disp: , Rfl:    fluticasone (FLONASE) 50 MCG/ACT nasal spray, USE 1 SPRAY(S) IN EACH NOSTRIL TWICE DAILY AS NEEDED FOR RHINITIS, Disp: 48 g, Rfl: 3   hydrochlorothiazide (HYDRODIURIL) 25 MG tablet, Take 1 tablet by mouth once daily, Disp: 90 tablet, Rfl: 3   ibuprofen (ADVIL) 600 MG tablet, Take 1 tablet (600 mg total) by mouth every 8 (eight) hours as needed. (Patient not taking: No sig reported), Disp: 30 tablet, Rfl: 0   losartan (COZAAR) 50 MG tablet, Take 1 tablet by mouth once daily, Disp: 90 tablet, Rfl: 0   Polyvinyl Alcohol-Povidone (REFRESH  OP), Place 1 drop as needed into both eyes (for dry eyes)., Disp: , Rfl:   EXAM:  VITALS per patient if applicable:  GENERAL: alert, oriented, appears well and in no acute distress  HEENT: atraumatic, conjunttiva clear, no obvious abnormalities on inspection of external nose and ears  NECK: normal movements of the head and neck  LUNGS: on inspection no signs of respiratory distress, breathing rate appears normal, no obvious gross SOB, gasping or wheezing  CV: no obvious cyanosis  MS: moves all visible extremities without noticeable abnormality  PSYCH/NEURO: pleasant and cooperative, no obvious depression or anxiety, speech and thought processing grossly intact  ASSESSMENT AND PLAN:  Discussed the following assessment and plan:  Persistent sinusitis symptoms.  Question bacterial sinusitis.  Has had some recent changes in mucus with thickening and color changes  -Amoxicillin 500 mg 3 times daily.  Patient has had diarrhea with Augmentin but states he can tolerate plain amoxicillin -Plenty of fluids -Office follow-up if not improving over the next week     I discussed the assessment and treatment plan with the patient. The patient was provided an opportunity to ask questions and all were answered. The patient agreed with the plan and demonstrated an understanding of the instructions.   The patient was advised to call back or seek an in-person evaluation if the symptoms worsen or if the condition fails to improve as anticipated.     Carolann Littler, MD

## 2020-06-22 ENCOUNTER — Other Ambulatory Visit: Payer: Self-pay | Admitting: Adult Health

## 2020-06-22 DIAGNOSIS — I10 Essential (primary) hypertension: Secondary | ICD-10-CM

## 2020-07-27 ENCOUNTER — Other Ambulatory Visit: Payer: Self-pay

## 2020-07-28 ENCOUNTER — Encounter: Payer: Self-pay | Admitting: Adult Health

## 2020-07-28 ENCOUNTER — Ambulatory Visit (INDEPENDENT_AMBULATORY_CARE_PROVIDER_SITE_OTHER): Payer: Medicare Other | Admitting: Adult Health

## 2020-07-28 VITALS — BP 130/70 | HR 92 | Temp 99.2°F | Ht 68.0 in | Wt 226.0 lb

## 2020-07-28 DIAGNOSIS — N400 Enlarged prostate without lower urinary tract symptoms: Secondary | ICD-10-CM

## 2020-07-28 DIAGNOSIS — R7303 Prediabetes: Secondary | ICD-10-CM | POA: Diagnosis not present

## 2020-07-28 DIAGNOSIS — E782 Mixed hyperlipidemia: Secondary | ICD-10-CM | POA: Diagnosis not present

## 2020-07-28 DIAGNOSIS — I10 Essential (primary) hypertension: Secondary | ICD-10-CM | POA: Diagnosis not present

## 2020-07-28 DIAGNOSIS — Z1159 Encounter for screening for other viral diseases: Secondary | ICD-10-CM

## 2020-07-28 LAB — HEMOGLOBIN A1C: Hgb A1c MFr Bld: 5.3 % (ref 4.6–6.5)

## 2020-07-28 LAB — CBC WITH DIFFERENTIAL/PLATELET
Basophils Absolute: 0.1 10*3/uL (ref 0.0–0.1)
Basophils Relative: 0.8 % (ref 0.0–3.0)
Eosinophils Absolute: 0.1 10*3/uL (ref 0.0–0.7)
Eosinophils Relative: 1.9 % (ref 0.0–5.0)
HCT: 50.8 % (ref 39.0–52.0)
Hemoglobin: 17.3 g/dL — ABNORMAL HIGH (ref 13.0–17.0)
Lymphocytes Relative: 22.4 % (ref 12.0–46.0)
Lymphs Abs: 1.8 10*3/uL (ref 0.7–4.0)
MCHC: 34 g/dL (ref 30.0–36.0)
MCV: 93.7 fl (ref 78.0–100.0)
Monocytes Absolute: 0.6 10*3/uL (ref 0.1–1.0)
Monocytes Relative: 8.2 % (ref 3.0–12.0)
Neutro Abs: 5.2 10*3/uL (ref 1.4–7.7)
Neutrophils Relative %: 66.7 % (ref 43.0–77.0)
Platelets: 305 10*3/uL (ref 150.0–400.0)
RBC: 5.42 Mil/uL (ref 4.22–5.81)
RDW: 12.8 % (ref 11.5–15.5)
WBC: 7.8 10*3/uL (ref 4.0–10.5)

## 2020-07-28 LAB — COMPREHENSIVE METABOLIC PANEL
ALT: 21 U/L (ref 0–53)
AST: 17 U/L (ref 0–37)
Albumin: 4.4 g/dL (ref 3.5–5.2)
Alkaline Phosphatase: 56 U/L (ref 39–117)
BUN: 11 mg/dL (ref 6–23)
CO2: 30 mEq/L (ref 19–32)
Calcium: 9.4 mg/dL (ref 8.4–10.5)
Chloride: 100 mEq/L (ref 96–112)
Creatinine, Ser: 0.96 mg/dL (ref 0.40–1.50)
GFR: 75.96 mL/min (ref >60.00)
Glucose, Bld: 100 mg/dL — ABNORMAL HIGH (ref 70–99)
Potassium: 3.8 mEq/L (ref 3.5–5.1)
Sodium: 139 mEq/L (ref 135–145)
Total Bilirubin: 1.5 mg/dL — ABNORMAL HIGH (ref 0.2–1.2)
Total Protein: 6.7 g/dL (ref 6.0–8.3)

## 2020-07-28 LAB — LIPID PANEL
Cholesterol: 151 mg/dL (ref 0–200)
HDL: 52.6 mg/dL (ref >39.00)
LDL Cholesterol: 86 mg/dL (ref 0–99)
NonHDL: 98.67
Total CHOL/HDL Ratio: 3
Triglycerides: 62 mg/dL (ref 0.0–149.0)
VLDL: 12.4 mg/dL (ref 0.0–40.0)

## 2020-07-28 LAB — TSH: TSH: 2.11 u[IU]/mL (ref 0.35–5.50)

## 2020-07-28 LAB — PSA: PSA: 1.84 ng/mL (ref 0.10–4.00)

## 2020-07-28 NOTE — Patient Instructions (Signed)
It was great seeing you today and I am glad you are feeling well.   I will follow up with you regarding your blood work   Keep working on weight loss.   I will see you back in one year

## 2020-07-28 NOTE — Progress Notes (Signed)
Subjective:    Patient ID: Todd Larson, male    DOB: 1940-11-16, 80 y.o.   MRN: [id number]  HPI Patient presents for yearly preventative medicine examination. He is a pleasant 80 year old male who  has a past medical history of Arthritis, Bulging discs, Diverticulosis of colon, GERD (gastroesophageal reflux disease), Hyperlipidemia, Hypertension, Inguinal hernia, OSA (obstructive sleep apnea), PSA elevation, and Recurrent sinus infections.  Essential Hypertension -scribed losartan 50 mg daily and HCTZ 25 mg daily.  He denies dizziness, lightheadedness, chest pain, or shortness of breath BP Readings from Last 3 Encounters:  07/28/20 130/70  04/18/20 138/70  03/22/20 (!) 141/77   Hyperlipidemia-diet controlled.  Not currently on any medication  Lab Results  Component Value Date   CHOL 157 07/22/2019   HDL 48 07/22/2019   LDLCALC 94 07/22/2019   TRIG 66 07/22/2019   CHOLHDL 3.3 07/22/2019    Glucose Intolerance Lab Results  Component Value Date   HGBA1C 5.1 07/22/2019   BPH - well managed with Proscar 5 mg. Is seen by Urology.   All immunizations and health maintenance protocols were reviewed with the patient and needed orders were placed.  Appropriate screening laboratory values were ordered for the patient including screening of hyperlipidemia, renal function and hepatic function.  Medication reconciliation,  past medical history, social history, problem list and allergies were reviewed in detail with the patient  Goals were established with regard to weight loss, exercise, and  diet in compliance with medications. He has been walking more frequently, in July he walked atleast a mile 27/28 days. He is also working on eating healthier.  Wt Readings from Last 3 Encounters:  07/28/20 226 lb (102.5 kg)  04/18/20 230 lb 3.2 oz (104.4 kg)  03/22/20 231 lb 6.4 oz (105 kg)   Has no acute issues   Review of Systems  Constitutional: Negative.   HENT: Negative.    Eyes:  Negative.   Respiratory: Negative.    Cardiovascular: Negative.   Gastrointestinal: Negative.   Endocrine: Negative.   Genitourinary: Negative.   Musculoskeletal:  Positive for arthralgias.  Skin: Negative.   Allergic/Immunologic: Negative.   Neurological: Negative.   Hematological: Negative.   Psychiatric/Behavioral: Negative.    All other systems reviewed and are negative.  Past Medical History:  Diagnosis Date   Arthritis    cervical spine - T7-8 & lumbar area - bone spurs up & down the back    Bulging discs     thoracic spine   Diverticulosis of colon    GERD (gastroesophageal reflux disease)    related to intestinal system, - has started Align  4-5 days ago-    Hyperlipidemia    Hypertension    Inguinal hernia    OSA (obstructive sleep apnea)    saw Dr. Annamaria Boots- 02/2015- pt. remarks that as a result BP med. was changed , pt. has a sleep number bed & he reports the score averages in the 80% range    PSA elevation    History of    Recurrent sinus infections    related to fracture nose x2, also reports that he has a deviated septum    Social History   Socioeconomic History   Marital status: Single    Spouse name: Not on file   Number of children: 0   Years of education: Not on file   Highest education level: Not on file  Occupational History   Occupation: retired  Tobacco Use   Smoking status: Never  Smokeless tobacco: Never  Vaping Use   Vaping Use: Never used  Substance and Sexual Activity   Alcohol use: Yes    Alcohol/week: 8.0 - 9.0 standard drinks    Types: 8 - 9 Glasses of wine per week    Comment: social/ drinks wine(white)   Drug use: No   Sexual activity: Not on file  Other Topics Concern   Not on file  Social History Narrative   Retired    Single    No kids       He likes to read, volunteers with an animal rescue group.       Social Determinants of Health   Financial Resource Strain: Low Risk    Difficulty of Paying Living Expenses: Not  hard at all  Food Insecurity: No Food Insecurity   Worried About Charity fundraiser in the Last Year: Never true   [place] in the Last Year: Never true  Transportation Needs: No Transportation Needs   Lack of Transportation (Medical): No   Lack of Transportation (Non-Medical): No  Physical Activity: Insufficiently Active   Days of Exercise per Week: 3 days   Minutes of Exercise per Session: 20 min  Stress: No Stress Concern Present   Feeling of Stress : Not at all  Social Connections: Socially Isolated   Frequency of Communication with Friends and Family: More than three times a week   Frequency of Social Gatherings with Friends and Family: Never   Attends Religious Services: Never   Marine scientist or Organizations: No   Attends Music therapist: Never   Marital Status: Never married  Human resources officer Violence: Not At Risk   Fear of Current or Ex-Partner: No   Emotionally Abused: No   Physically Abused: No   Sexually Abused: No    Past Surgical History:  Procedure Laterality Date   COLONOSCOPY  2008   3 internal hemorrhoids, diverticulosis;Dr.Jacobs   INSERTION OF MESH N/A 11/13/2016   Procedure: INSERTION OF MESH;  Surgeon: Ralene Ok, MD;  Location: [place];  Service: General;  Laterality: N/A;   LAPAROSCOPIC INGUINAL HERNIA WITH UMBILICAL HERNIA Left [id number]   Procedure: LAPAROSCOPIC LEFT INGUINAL HERNIA  AND UMBILICAL HERNIA REPAIRS WITH MESH;  Surgeon: Ralene Ok, MD;  Location: [place];  Service: General;  Laterality: Left;   PROSTATE BIOPSY     x 2, once abnormal, once normal   TONSILLECTOMY      Family History  Problem Relation Age of Onset   Lung cancer Maternal Grandfather        enviromental    Heart attack Paternal Uncle    Prostate cancer Brother     Allergies  Allergen Reactions   Amoxicillin-Pot Clavulanate Diarrhea and Other (See Comments)    Night sweats, not allergic to Amoxicillin, just can't tolerate  augmentin    Current Outpatient Medications on File Prior to Visit  Medication Sig Dispense Refill   finasteride (PROSCAR) 5 MG tablet Take 5 mg by mouth daily.     fluticasone (FLONASE) 50 MCG/ACT nasal spray USE 1 SPRAY(S) IN EACH NOSTRIL TWICE DAILY AS NEEDED FOR RHINITIS 48 g 3   hydrochlorothiazide (HYDRODIURIL) 25 MG tablet Take 1 tablet by mouth once daily 90 tablet 3   losartan (COZAAR) 50 MG tablet Take 1 tablet by mouth once daily 90 tablet 0   Polyvinyl Alcohol-Povidone (REFRESH OP) Place 1 drop as needed into both eyes (for dry eyes).     COVID-19 mRNA  Vac-TriS, Pfizer, SUSP injection Inject into the muscle. (Patient not taking: Reported on 07/28/2020) 0.3 mL 0   No current facility-administered medications on file prior to visit.    BP 130/70   Pulse 92   Temp 99.2 F (37.3 C) (Oral)   Ht '5\' 8"'$  (1.727 m)   Wt 226 lb (102.5 kg)   SpO2 100%   BMI 34.36 kg/m        Objective:   Physical Exam Vitals and nursing note reviewed.  Constitutional:      General: He is not in acute distress.    Appearance: Normal appearance. He is well-developed. He is obese.  HENT:     Head: Normocephalic and atraumatic.     Right Ear: Tympanic membrane, ear canal and external ear normal. There is no impacted cerumen.     Left Ear: Tympanic membrane, ear canal and external ear normal. There is no impacted cerumen.     Nose: Nose normal. No congestion or rhinorrhea.     Mouth/Throat:     Mouth: Mucous membranes are moist.     Pharynx: Oropharynx is clear. No oropharyngeal exudate or posterior oropharyngeal erythema.  Eyes:     General:        Right eye: No discharge.        Left eye: No discharge.     Extraocular Movements: Extraocular movements intact.     Conjunctiva/sclera: Conjunctivae normal.     Pupils: Pupils are equal, round, and reactive to light.  Neck:     Vascular: No carotid bruit.     Trachea: No tracheal deviation.  Cardiovascular:     Rate and Rhythm: Normal rate  and regular rhythm.     Pulses: Normal pulses.     Heart sounds: Normal heart sounds. No murmur heard.   No friction rub. No gallop.  Pulmonary:     Effort: Pulmonary effort is normal. No respiratory distress.     Breath sounds: Normal breath sounds. No stridor. No wheezing, rhonchi or rales.  Chest:     Chest wall: No tenderness.  Abdominal:     General: Bowel sounds are normal. There is no distension.     Palpations: Abdomen is soft. There is no mass.     Tenderness: There is no abdominal tenderness. There is no right CVA tenderness, left CVA tenderness, guarding or rebound.     Hernia: No hernia is present.  Musculoskeletal:        General: No swelling, tenderness, deformity or signs of injury. Normal range of motion.     Right lower leg: No edema.     Left lower leg: No edema.  Lymphadenopathy:     Cervical: No cervical adenopathy.  Skin:    General: Skin is warm and dry.     Capillary Refill: Capillary refill takes less than 2 seconds.     Coloration: Skin is not jaundiced or pale.     Findings: No bruising, erythema, lesion or rash.  Neurological:     General: No focal deficit present.     Mental Status: He is alert and oriented to person, place, and time.     Cranial Nerves: No cranial nerve deficit.     Sensory: No sensory deficit.     Motor: No weakness.     Coordination: Coordination normal.     Gait: Gait normal.     Deep Tendon Reflexes: Reflexes normal.  Psychiatric:        Mood and Affect: Mood normal.  Behavior: Behavior normal.        Thought Content: Thought content normal.        Judgment: Judgment normal.      Assessment & Plan:  1. Essential hypertension - Well controlled. No change in medications - Encouraged to continue to exercise and eat healthy  - Follow up in one year or sooner if needed - CBC with Differential/Platelet; Future - Comprehensive metabolic panel; Future - Hemoglobin A1c; Future - Lipid panel; Future - TSH; Future -  TSH - Lipid panel - Hemoglobin A1c - Comprehensive metabolic panel - CBC with Differential/Platelet  2. Mixed hyperlipidemia - Consider statin  - CBC with Differential/Platelet; Future - Comprehensive metabolic panel; Future - Hemoglobin A1c; Future - Lipid panel; Future - TSH; Future - TSH - Lipid panel - Hemoglobin A1c - Comprehensive metabolic panel - CBC with Differential/Platelet  3. Need for hepatitis C screening test  - Hep C Antibody; Future - Hep C Antibody  4. Benign prostatic hyperplasia without lower urinary tract symptoms  - PSA; Future - PSA  5. Pre-diabetes - Consider adding metformin  - CBC with Differential/Platelet; Future - Comprehensive metabolic panel; Future - Hemoglobin A1c; Future - Lipid panel; Future - TSH; Future - TSH - Lipid panel - Hemoglobin A1c - Comprehensive metabolic panel - CBC with Differential/Platelet  Dorothyann Peng, NP

## 2020-07-29 LAB — HEPATITIS C ANTIBODY
Hepatitis C Ab: NONREACTIVE
SIGNAL TO CUT-OFF: 0.01 (ref <1.00)

## 2020-09-12 ENCOUNTER — Other Ambulatory Visit (HOSPITAL_BASED_OUTPATIENT_CLINIC_OR_DEPARTMENT_OTHER): Payer: Self-pay

## 2020-09-20 ENCOUNTER — Other Ambulatory Visit: Payer: Self-pay | Admitting: Adult Health

## 2020-09-20 DIAGNOSIS — I10 Essential (primary) hypertension: Secondary | ICD-10-CM

## 2020-10-04 ENCOUNTER — Telehealth (INDEPENDENT_AMBULATORY_CARE_PROVIDER_SITE_OTHER): Payer: Medicare Other | Admitting: Adult Health

## 2020-10-04 ENCOUNTER — Encounter: Payer: Self-pay | Admitting: Adult Health

## 2020-10-04 VITALS — HR 98 | Temp 97.7°F | Ht 68.0 in | Wt 222.0 lb

## 2020-10-04 DIAGNOSIS — R051 Acute cough: Secondary | ICD-10-CM

## 2020-10-04 DIAGNOSIS — J988 Other specified respiratory disorders: Secondary | ICD-10-CM

## 2020-10-04 DIAGNOSIS — B9789 Other viral agents as the cause of diseases classified elsewhere: Secondary | ICD-10-CM | POA: Diagnosis not present

## 2020-10-04 MED ORDER — BENZONATATE 200 MG PO CAPS: 200.0000 mg | ORAL_CAPSULE | Freq: Two times a day (BID) | ORAL | 0 refills | Status: DC | PRN

## 2020-10-04 NOTE — Progress Notes (Signed)
Virtual Visit via Video Note  I connected with Todd Larson on 10/04/20 at  4:30 PM EDT by a video enabled telemedicine application and verified that I am speaking with the correct person using two identifiers.  Location patient: home Location provider:work or home office Persons participating in the virtual visit: patient, provider  I discussed the limitations of evaluation and management by telemedicine and the availability of in person appointments. The patient expressed understanding and agreed to proceed.   HPI:  80 year old male who is being evaluated today for an acute issue.  His symptoms started roughly 4 days ago.  Symptoms include productive cough, runny nose, body aches, and chest congestion.  Reports that during the first few days he was coughing up "a lot of white mucus".  Denies fevers or chills.  He did take at home COVID test which was negative.  Today when he woke up he had less body aches, less of a productive cough and feels as though he is improving.  Home he is using Motrin and Mucinex which seem to be helping to alleviate his symptoms.  ROS: See pertinent positives and negatives per HPI.  Past Medical History:  Diagnosis Date   Arthritis    cervical spine - T7-8 & lumbar area - bone spurs up & down the back    Bulging discs     thoracic spine   Diverticulosis of colon    GERD (gastroesophageal reflux disease)    related to intestinal system, - has started Align  4-5 days ago-    Hyperlipidemia    Hypertension    Inguinal hernia    OSA (obstructive sleep apnea)    saw Dr. Annamaria Boots- 02/2015- pt. remarks that as a result BP med. was changed , pt. has a sleep number bed & he reports the score averages in the 80% range    PSA elevation    History of    Recurrent sinus infections    related to fracture nose x2, also reports that he has a deviated septum    Past Surgical History:  Procedure Laterality Date   COLONOSCOPY  2008   3 internal hemorrhoids,  diverticulosis;Dr.Jacobs   INSERTION OF MESH N/A 11/13/2016   Procedure: INSERTION OF MESH;  Surgeon: Ralene Ok, MD;  Location: [place];  Service: General;  Laterality: N/A;   LAPAROSCOPIC INGUINAL HERNIA WITH UMBILICAL HERNIA Left 59/56/3875   Procedure: LAPAROSCOPIC LEFT INGUINAL HERNIA  AND UMBILICAL HERNIA REPAIRS WITH MESH;  Surgeon: Ralene Ok, MD;  Location: [place];  Service: General;  Laterality: Left;   PROSTATE BIOPSY     x 2, once abnormal, once normal   TONSILLECTOMY      Family History  Problem Relation Age of Onset   Lung cancer Maternal Grandfather        enviromental    Heart attack Paternal Uncle    Prostate cancer Brother        Current Outpatient Medications:    finasteride (PROSCAR) 5 MG tablet, Take 5 mg by mouth daily., Disp: , Rfl:    fluticasone (FLONASE) 50 MCG/ACT nasal spray, USE 1 SPRAY(S) IN EACH NOSTRIL TWICE DAILY AS NEEDED FOR RHINITIS, Disp: 48 g, Rfl: 3   hydrochlorothiazide (HYDRODIURIL) 25 MG tablet, Take 1 tablet by mouth once daily, Disp: 90 tablet, Rfl: 3   losartan (COZAAR) 50 MG tablet, Take 1 tablet by mouth once daily, Disp: 90 tablet, Rfl: 0   Polyvinyl Alcohol-Povidone (REFRESH OP), Place 1 drop as needed into both eyes (  for dry eyes)., Disp: , Rfl:   EXAM:  VITALS per patient if applicable:  GENERAL: alert, oriented, appears well and in no acute distress  HEENT: atraumatic, conjunttiva clear, no obvious abnormalities on inspection of external nose and ears  NECK: normal movements of the head and neck  LUNGS: on inspection no signs of respiratory distress, breathing rate appears normal, no obvious gross SOB, gasping or wheezing  CV: no obvious cyanosis  MS: moves all visible extremities without noticeable abnormality  PSYCH/NEURO: pleasant and cooperative, no obvious depression or anxiety, speech and thought processing grossly intact  ASSESSMENT AND PLAN:  Discussed the following assessment and plan:  1. Viral  respiratory illness -Seems to be resolving.  He can continue with Mucinex and Motrin as needed.  Advised to have follow-up in the next 2 to 3 days if symptoms worsen or he develops a fever or chills.  2. Acute cough  - benzonatate (TESSALON) 200 MG capsule; Take 1 capsule (200 mg total) by mouth 2 (two) times daily as needed for cough.  Dispense: 20 capsule; Refill: 0      I discussed the assessment and treatment plan with the patient. The patient was provided an opportunity to ask questions and all were answered. The patient agreed with the plan and demonstrated an understanding of the instructions.   The patient was advised to call back or seek an in-person evaluation if the symptoms worsen or if the condition fails to improve as anticipated.   Dorothyann Peng, NP

## 2020-10-20 DIAGNOSIS — N138 Other obstructive and reflux uropathy: Secondary | ICD-10-CM | POA: Diagnosis not present

## 2020-10-20 DIAGNOSIS — N401 Enlarged prostate with lower urinary tract symptoms: Secondary | ICD-10-CM | POA: Diagnosis not present

## 2020-10-20 DIAGNOSIS — R3589 Other polyuria: Secondary | ICD-10-CM | POA: Diagnosis not present

## 2020-10-26 ENCOUNTER — Ambulatory Visit: Payer: Medicare Other | Attending: Internal Medicine

## 2020-10-26 ENCOUNTER — Other Ambulatory Visit (HOSPITAL_BASED_OUTPATIENT_CLINIC_OR_DEPARTMENT_OTHER): Payer: Self-pay

## 2020-10-26 DIAGNOSIS — Z23 Encounter for immunization: Secondary | ICD-10-CM | POA: Diagnosis not present

## 2020-10-26 MED ORDER — INFLUENZA VAC A&B SA ADJ QUAD 0.5 ML IM PRSY
PREFILLED_SYRINGE | INTRAMUSCULAR | 0 refills | Status: DC
  Filled 2020-10-26: qty 0.5, 1d supply, fill #0

## 2020-10-26 NOTE — Progress Notes (Signed)
   Covid-19 Vaccination Clinic  Name:  Todd Larson    MRN: [id number] DOB: [birth year]  10/26/2020  Mr. Todd Larson was observed post Covid-19 immunization for 15 minutes without incident. He was provided with Vaccine Information Sheet and instruction to access the V-Safe system.   Mr. Todd Larson was instructed to call 911 with any severe reactions post vaccine: Difficulty breathing  Swelling of face and throat  A fast heartbeat  A bad rash all over body  Dizziness and weakness   Immunizations Administered     Name Date Dose VIS Date Route   Pfizer Covid-19 Vaccine Bivalent Booster 10/26/2020 10:39 AM 0.3 mL 08/31/2020 Intramuscular   Manufacturer: [place]   Lot: [id number]   [place]: [phone]

## 2020-10-28 ENCOUNTER — Other Ambulatory Visit (HOSPITAL_BASED_OUTPATIENT_CLINIC_OR_DEPARTMENT_OTHER): Payer: Self-pay

## 2020-11-18 ENCOUNTER — Other Ambulatory Visit (HOSPITAL_BASED_OUTPATIENT_CLINIC_OR_DEPARTMENT_OTHER): Payer: Self-pay

## 2020-11-18 DIAGNOSIS — Z23 Encounter for immunization: Secondary | ICD-10-CM | POA: Diagnosis not present

## 2020-11-18 MED ORDER — PFIZER COVID-19 VAC BIVALENT 30 MCG/0.3ML IM SUSP
INTRAMUSCULAR | 0 refills | Status: DC
  Filled 2020-11-18: qty 0.3, 1d supply, fill #0

## 2020-11-29 ENCOUNTER — Ambulatory Visit (INDEPENDENT_AMBULATORY_CARE_PROVIDER_SITE_OTHER): Payer: Medicare Other

## 2020-11-29 VITALS — Temp 97.9°F | Ht 68.0 in | Wt 221.0 lb

## 2020-11-29 DIAGNOSIS — Z0000 Encounter for general adult medical examination without abnormal findings: Secondary | ICD-10-CM | POA: Diagnosis not present

## 2020-11-29 NOTE — Progress Notes (Signed)
I connected with  Todd Larson today via telehealth video enabled device and verified that I am speaking with the correct person using two identifiers.   Location: Patient: home Provider: work  Persons participating in virtual visit: Todd Larson, Glenna Durand LPN  I discussed the limitations, risks, security and privacy concerns of performing an evaluation and management service by video and the availability of in person appointments. The patient expressed understanding and agreed to proceed.   Some vital signs may be absent or patient reported.     Subjective:   Todd Larson is a 80 y.o. male who presents for Medicare Annual/Subsequent preventive examination.  Review of Systems     Cardiac Risk Factors include: advanced age (>60men, >35 women);dyslipidemia;hypertension;male gender;obesity (BMI >30kg/m2)     Objective:    Today's Vitals   11/29/20 0954  Temp: 97.9 F (36.6 C)  SpO2: 95%  Weight: 221 lb (100.2 kg)  Height: 5\' 8"  (1.727 m)   Body mass index is 33.6 kg/m.  Advanced Directives 11/29/2020 11/24/2019 01/27/2019 11/07/2016 12/07/2014  Does Patient Have a Medical Advance Directive? No No No No No  Would patient like information on creating a medical advance directive? - No - Patient declined No - Patient declined Yes (MAU/Ambulatory/Procedural Areas - Information given) Yes - Educational materials given    Current Medications (verified) Outpatient Encounter Medications as of 11/29/2020  Medication Sig   finasteride (PROSCAR) 5 MG tablet Take 5 mg by mouth daily.   fluticasone (FLONASE) 50 MCG/ACT nasal spray USE 1 SPRAY(S) IN EACH NOSTRIL TWICE DAILY AS NEEDED FOR RHINITIS   hydrochlorothiazide (HYDRODIURIL) 25 MG tablet Take 1 tablet by mouth once daily   losartan (COZAAR) 50 MG tablet Take 1 tablet by mouth once daily   Polyvinyl Alcohol-Povidone (REFRESH OP) Place 1 drop as needed into both eyes (for dry eyes).   benzonatate (TESSALON) 200 MG capsule Take 1  capsule (200 mg total) by mouth 2 (two) times daily as needed for cough. (Patient not taking: Reported on 11/29/2020)   COVID-19 mRNA bivalent vaccine, Pfizer, (PFIZER COVID-19 VAC BIVALENT) injection Inject into the muscle.   influenza vaccine adjuvanted (FLUAD) 0.5 ML injection Inject into the muscle.   No facility-administered encounter medications on file as of 11/29/2020.    Allergies (verified) Amoxicillin-pot clavulanate   History: Past Medical History:  Diagnosis Date   Arthritis    cervical spine - T7-8 & lumbar area - bone spurs up & down the back    Bulging discs     thoracic spine   Diverticulosis of colon    GERD (gastroesophageal reflux disease)    related to intestinal system, - has started Align  4-5 days ago-    Hyperlipidemia    Hypertension    Inguinal hernia    OSA (obstructive sleep apnea)    saw Dr. Annamaria Boots- 02/2015- pt. remarks that as a result BP med. was changed , pt. has a sleep number bed & he reports the score averages in the 80% range    PSA elevation    History of    Recurrent sinus infections    related to fracture nose x2, also reports that he has a deviated septum   Past Surgical History:  Procedure Laterality Date   COLONOSCOPY  2008   3 internal hemorrhoids, diverticulosis;Dr.Jacobs   INSERTION OF MESH N/A 11/13/2016   Procedure: INSERTION OF MESH;  Surgeon: Ralene Ok, MD;  Location: [place];  Service: General;  Laterality: N/A;  LAPAROSCOPIC INGUINAL HERNIA WITH UMBILICAL HERNIA Left 01/03/7251   Procedure: LAPAROSCOPIC LEFT INGUINAL HERNIA  AND UMBILICAL HERNIA REPAIRS WITH MESH;  Surgeon: Ralene Ok, MD;  Location: [place];  Service: General;  Laterality: Left;   PROSTATE BIOPSY     x 2, once abnormal, once normal   TONSILLECTOMY     Family History  Problem Relation Age of Onset   Lung cancer Maternal Grandfather        enviromental    Heart attack Paternal Uncle    Prostate cancer Brother    Social History    Socioeconomic History   Marital status: Single    Spouse name: Not on file   Number of children: 0   Years of education: Not on file   Highest education level: Not on file  Occupational History   Occupation: retired  Tobacco Use   Smoking status: Never   Smokeless tobacco: Never  Vaping Use   Vaping Use: Never used  Substance and Sexual Activity   Alcohol use: Yes    Alcohol/week: 8.0 - 9.0 standard drinks    Types: 8 - 9 Glasses of wine per week    Comment: social/ drinks wine(white)   Drug use: No   Sexual activity: Not on file  Other Topics Concern   Not on file  Social History Narrative   Retired    Single    No kids       He likes to read, volunteers with an animal rescue group.       Social Determinants of Health   Financial Resource Strain: Low Risk    Difficulty of Paying Living Expenses: Not hard at all  Food Insecurity: No Food Insecurity   Worried About Charity fundraiser in the Last Year: Never true   [place] in the Last Year: Never true  Transportation Needs: No Transportation Needs   Lack of Transportation (Medical): No   Lack of Transportation (Non-Medical): No  Physical Activity: Sufficiently Active   Days of Exercise per Week: 6 days   Minutes of Exercise per Session: 30 min  Stress: No Stress Concern Present   Feeling of Stress : Not at all  Social Connections: Not on file    Tobacco Counseling Counseling given: Not Answered   Clinical Intake:  Pre-visit preparation completed: Yes  Pain : No/denies pain     Nutritional Status: BMI > 30  Obese Nutritional Risks: None Diabetes: No  How often do you need to have someone help you when you read instructions, pamphlets, or other written materials from your doctor or pharmacy?: 1 - Never What is the last grade level you completed in school?: Masters degree  Diabetic? no  Interpreter Needed?: No  Information entered by :: NAllen LPN   Activities of Daily Living In your  present state of health, do you have any difficulty performing the following activities: 11/29/2020 11/29/2020  Hearing? N N  Vision? N N  Difficulty concentrating or making decisions? N N  Walking or climbing stairs? Y Y  Dressing or bathing? N N  Doing errands, shopping? N N  Preparing Food and eating ? N N  Using the Toilet? N N  In the past six months, have you accidently leaked urine? N N  Do you have problems with loss of bowel control? N N  Managing your Medications? N N  Managing your Finances? N N  Housekeeping or managing your Housekeeping? N N  Some recent data might be  hidden    Patient Care Team: Dorothyann Peng, NP as PCP - General (Family Medicine) Puschinsky, Fransico Him., MD (Urology)  Indicate any recent Medical Services you may have received from other than [facility] providers in the past year (date may be approximate).     Assessment:   This is a routine wellness examination for [place].  Hearing/Vision screen No results found.  Dietary issues and exercise activities discussed: Current Exercise Habits: Home exercise routine, Type of exercise: walking, Time (Minutes): 30, Frequency (Times/Week): 6, Weekly Exercise (Minutes/Week): 180   Goals Addressed             This Visit's Progress    Patient Stated       11/29/2020, continue to lose weight       Depression Screen PHQ 2/9 Scores 11/29/2020 11/24/2019 11/11/2019 07/22/2019 04/09/2017 12/07/2014 02/15/2014  PHQ - 2 Score 0 0 0 0 0 0 0    Fall Risk Fall Risk  11/29/2020 11/29/2020 11/24/2019 11/11/2019 07/22/2019  Falls in the past year? 0 0 0 0 0  Number falls in past yr: - 0 0 - -  Injury with Fall? - 0 0 - -  Risk for fall due to : Medication side effect - Impaired vision - -  Follow up Falls evaluation completed;Education provided;Falls prevention discussed - Falls prevention discussed - -    FALL RISK PREVENTION PERTAINING TO THE HOME:  Any stairs in or around the home? Yes  If so, are there any  without handrails? No  Home free of loose throw rugs in walkways, pet beds, electrical cords, etc? Yes  Adequate lighting in your home to reduce risk of falls? Yes   ASSISTIVE DEVICES UTILIZED TO PREVENT FALLS:  Life alert? No  Use of a cane, walker or w/c? No  Grab bars in the bathroom? No  Shower chair or bench in shower? No  Elevated toilet seat or a handicapped toilet? No   TIMED UP AND GO:  Was the test performed? No .      Cognitive Function: MMSE - Mini Mental State Exam 12/07/2014  Not completed: (No Data)     6CIT Screen 11/29/2020 11/24/2019  What Year? 0 points 0 points  What month? 0 points 0 points  What time? 0 points -  Count back from 20 0 points 0 points  Months in reverse 0 points 0 points  Repeat phrase 0 points 0 points  Total Score 0 -    Immunizations Immunization History  Administered Date(s) Administered   Fluad Quad(high Dose 65+) 09/27/2018, 09/28/2019, 10/26/2020   H1N1 12/24/2007   Influenza Inj Mdck Quad Pf 10/26/2020   Influenza Split 10/03/2010, 10/23/2011   Influenza Whole 11/21/2006, 09/25/2007, 09/16/2009   Influenza, High Dose Seasonal PF 10/30/2012, 01/05/2016, 10/01/2016, 10/10/2017   Influenza,inj,Quad PF,6+ Mos 10/30/2013, 09/27/2014   PFIZER Comirnaty(Gray Top)Covid-19 Tri-Sucrose Vaccine 06/09/2020   PFIZER(Purple Top)SARS-COV-2 Vaccination 01/21/2019, 02/11/2019, 09/30/2019   Pfizer Covid-19 Vaccine Bivalent Booster 60yrs & up 10/26/2020   Pneumococcal Conjugate-13 12/07/2014   Pneumococcal Polysaccharide-23 10/02/2007   Td 11/21/2008    TDAP status: Due, Education has been provided regarding the importance of this vaccine. Advised may receive this vaccine at local pharmacy or Health Dept. Aware to provide a copy of the vaccination record if obtained from local pharmacy or Health Dept. Verbalized acceptance and understanding.  Flu Vaccine status: Up to date  Pneumococcal vaccine status: Up to date  Covid-19 vaccine  status: Completed vaccines  Qualifies for Shingles Vaccine? Yes  Zostavax completed No   Shingrix Completed?: No.    Education has been provided regarding the importance of this vaccine. Patient has been advised to call insurance company to determine out of pocket expense if they have not yet received this vaccine. Advised may also receive vaccine at local pharmacy or Health Dept. Verbalized acceptance and understanding.  Screening Tests Health Maintenance  Topic Date Due   Zoster Vaccines- Shingrix (1 of 2) Never done   Pneumonia Vaccine 47+ Years old  Completed   INFLUENZA VACCINE  Completed   COVID-19 Vaccine  Completed   Hepatitis C Screening  Completed   HPV VACCINES  Aged Out   Fecal DNA (Cologuard)  Discontinued    Health Maintenance  Health Maintenance Due  Topic Date Due   Zoster Vaccines- Shingrix (1 of 2) Never done    Colorectal cancer screening: No longer required.   Lung Cancer Screening: (Low Dose CT Chest recommended if Age 23-80 years, 30 pack-year currently smoking OR have quit w/in 15years.) does not qualify.   Lung Cancer Screening Referral: no  Additional Screening:  Hepatitis C Screening: does qualify; Completed 07/28/2020  Vision Screening: Recommended annual ophthalmology exams for early detection of glaucoma and other disorders of the eye. Is the patient up to date with their annual eye exam?  Yes  Who is the provider or what is the name of the office in which the patient attends annual eye exams? [facility] If pt is not established with a provider, would they like to be referred to a provider to establish care? No .   Dental Screening: Recommended annual dental exams for proper oral hygiene  Community Resource Referral / Chronic Care Management: CRR required this visit?  No   CCM required this visit?  No      Plan:     I have personally reviewed and noted the following in the patient's chart:   Medical and social history Use of  alcohol, tobacco or illicit drugs  Current medications and supplements including opioid prescriptions. Patient is not currently taking opioid prescriptions. Functional ability and status Nutritional status Physical activity Advanced directives List of other physicians Hospitalizations, surgeries, and ER visits in previous 12 months Vitals Screenings to include cognitive, depression, and falls Referrals and appointments  In addition, I have reviewed and discussed with patient certain preventive protocols, quality metrics, and best practice recommendations. A written personalized care plan for preventive services as well as general preventive health recommendations were provided to patient.     Kellie Simmering, LPN   [id number]   Nurse Notes: none

## 2020-11-29 NOTE — Patient Instructions (Signed)
Todd Larson , Thank you for taking time to come for your Medicare Wellness Visit. I appreciate your ongoing commitment to your health goals. Please review the following plan we discussed and let me know if I can assist you in the future.   Screening recommendations/referrals: Colonoscopy: not required Recommended yearly ophthalmology/optometry visit for glaucoma screening and checkup Recommended yearly dental visit for hygiene and checkup  Vaccinations: Influenza vaccine: completed 10/26/2020 Pneumococcal vaccine: completed 12/07/2014 Tdap vaccine: due Shingles vaccine: discussed   Covid-19:  10/26/2020, 06/09/2020, 09/30/2019, 02/11/2019, 01/21/2019  Advanced directives: Advance directive discussed with you today.   Conditions/risks identified: none  Next appointment: Follow up in one year for your annual wellness visit.   Preventive Care 18 Years and Older, Male Preventive care refers to lifestyle choices and visits with your health care provider that can promote health and wellness. What does preventive care include? A yearly physical exam. This is also called an annual well check. Dental exams once or twice a year. Routine eye exams. Ask your health care provider how often you should have your eyes checked. Personal lifestyle choices, including: Daily care of your teeth and gums. Regular physical activity. Eating a healthy diet. Avoiding tobacco and drug use. Limiting alcohol use. Practicing safe sex. Taking low doses of aspirin every day. Taking vitamin and mineral supplements as recommended by your health care provider. What happens during an annual well check? The services and screenings done by your health care provider during your annual well check will depend on your age, overall health, lifestyle risk factors, and family history of disease. Counseling  Your health care provider may ask you questions about your: Alcohol use. Tobacco use. Drug use. Emotional  well-being. Home and relationship well-being. Sexual activity. Eating habits. History of falls. Memory and ability to understand (cognition). Work and work Statistician. Screening  You may have the following tests or measurements: Height, weight, and BMI. Blood pressure. Lipid and cholesterol levels. These may be checked every 5 years, or more frequently if you are over 37 years old. Skin check. Lung cancer screening. You may have this screening every year starting at age 71 if you have a 30-pack-year history of smoking and currently smoke or have quit within the past 15 years. Fecal occult blood test (FOBT) of the stool. You may have this test every year starting at age 82. Flexible sigmoidoscopy or colonoscopy. You may have a sigmoidoscopy every 5 years or a colonoscopy every 10 years starting at age 57. Prostate cancer screening. Recommendations will vary depending on your family history and other risks. Hepatitis C blood test. Hepatitis B blood test. Sexually transmitted disease (STD) testing. Diabetes screening. This is done by checking your blood sugar (glucose) after you have not eaten for a while (fasting). You may have this done every 1-3 years. Abdominal aortic aneurysm (AAA) screening. You may need this if you are a current or former smoker. Osteoporosis. You may be screened starting at age 25 if you are at high risk. Talk with your health care provider about your test results, treatment options, and if necessary, the need for more tests. Vaccines  Your health care provider may recommend certain vaccines, such as: Influenza vaccine. This is recommended every year. Tetanus, diphtheria, and acellular pertussis (Tdap, Td) vaccine. You may need a Td booster every 10 years. Zoster vaccine. You may need this after age 46. Pneumococcal 13-valent conjugate (PCV13) vaccine. One dose is recommended after age 90. Pneumococcal polysaccharide (PPSV23) vaccine. One dose is recommended after  age 72. Talk to your health care provider about which screenings and vaccines you need and how often you need them. This information is not intended to replace advice given to you by your health care provider. Make sure you discuss any questions you have with your health care provider. Document Released: 01/14/2015 Document Revised: 09/07/2015 Document Reviewed: 10/19/2014 Elsevier Interactive Patient Education  2017 [place] Prevention in the Home Falls can cause injuries. They can happen to people of all ages. There are many things you can do to make your home safe and to help prevent falls. What can I do on the outside of my home? Regularly fix the edges of walkways and driveways and fix any cracks. Remove anything that might make you trip as you walk through a door, such as a raised step or threshold. Trim any bushes or trees on the path to your home. Use bright outdoor lighting. Clear any walking paths of anything that might make someone trip, such as rocks or tools. Regularly check to see if handrails are loose or broken. Make sure that both sides of any steps have handrails. Any raised decks and porches should have guardrails on the edges. Have any leaves, snow, or ice cleared regularly. Use sand or salt on walking paths during winter. Clean up any spills in your garage right away. This includes oil or grease spills. What can I do in the bathroom? Use night lights. Install grab bars by the toilet and in the tub and shower. Do not use towel bars as grab bars. Use non-skid mats or decals in the tub or shower. If you need to sit down in the shower, use a plastic, non-slip stool. Keep the floor dry. Clean up any water that spills on the floor as soon as it happens. Remove soap buildup in the tub or shower regularly. Attach bath mats securely with double-sided non-slip rug tape. Do not have throw rugs and other things on the floor that can make you trip. What can I do in the  bedroom? Use night lights. Make sure that you have a light by your bed that is easy to reach. Do not use any sheets or blankets that are too big for your bed. They should not hang down onto the floor. Have a firm chair that has side arms. You can use this for support while you get dressed. Do not have throw rugs and other things on the floor that can make you trip. What can I do in the kitchen? Clean up any spills right away. Avoid walking on wet floors. Keep items that you use a lot in easy-to-reach places. If you need to reach something above you, use a strong step stool that has a grab bar. Keep electrical cords out of the way. Do not use floor polish or wax that makes floors slippery. If you must use wax, use non-skid floor wax. Do not have throw rugs and other things on the floor that can make you trip. What can I do with my stairs? Do not leave any items on the stairs. Make sure that there are handrails on both sides of the stairs and use them. Fix handrails that are broken or loose. Make sure that handrails are as long as the stairways. Check any carpeting to make sure that it is firmly attached to the stairs. Fix any carpet that is loose or worn. Avoid having throw rugs at the top or bottom of the stairs. If you do have  throw rugs, attach them to the floor with carpet tape. Make sure that you have a light switch at the top of the stairs and the bottom of the stairs. If you do not have them, ask someone to add them for you. What else can I do to help prevent falls? Wear shoes that: Do not have high heels. Have rubber bottoms. Are comfortable and fit you well. Are closed at the toe. Do not wear sandals. If you use a stepladder: Make sure that it is fully opened. Do not climb a closed stepladder. Make sure that both sides of the stepladder are locked into place. Ask someone to hold it for you, if possible. Clearly mark and make sure that you can see: Any grab bars or  handrails. First and last steps. Where the edge of each step is. Use tools that help you move around (mobility aids) if they are needed. These include: Canes. Walkers. Scooters. Crutches. Turn on the lights when you go into a dark area. Replace any light bulbs as soon as they burn out. Set up your furniture so you have a clear path. Avoid moving your furniture around. If any of your floors are uneven, fix them. If there are any pets around you, be aware of where they are. Review your medicines with your doctor. Some medicines can make you feel dizzy. This can increase your chance of falling. Ask your doctor what other things that you can do to help prevent falls. This information is not intended to replace advice given to you by your health care provider. Make sure you discuss any questions you have with your health care provider. Document Released: 10/14/2008 Document Revised: 05/26/2015 Document Reviewed: 01/22/2014 Elsevier Interactive Patient Education  2017 [employer].

## 2020-12-19 ENCOUNTER — Other Ambulatory Visit: Payer: Self-pay | Admitting: Adult Health

## 2020-12-19 DIAGNOSIS — I10 Essential (primary) hypertension: Secondary | ICD-10-CM

## 2021-01-08 ENCOUNTER — Other Ambulatory Visit: Payer: Self-pay | Admitting: Adult Health

## 2021-01-08 DIAGNOSIS — I10 Essential (primary) hypertension: Secondary | ICD-10-CM

## 2021-03-16 ENCOUNTER — Ambulatory Visit (INDEPENDENT_AMBULATORY_CARE_PROVIDER_SITE_OTHER): Payer: Medicare Other | Admitting: Adult Health

## 2021-03-16 ENCOUNTER — Encounter: Payer: Self-pay | Admitting: Adult Health

## 2021-03-16 VITALS — BP 122/60 | HR 97 | Temp 99.0°F | Ht 68.0 in | Wt 227.0 lb

## 2021-03-16 DIAGNOSIS — M79662 Pain in left lower leg: Secondary | ICD-10-CM | POA: Diagnosis not present

## 2021-03-16 DIAGNOSIS — M79661 Pain in right lower leg: Secondary | ICD-10-CM

## 2021-03-16 NOTE — Progress Notes (Signed)
? ?Subjective:  ? ? Patient ID: Todd Larson, male    DOB: [birth year], 81 y.o.   MRN: [id number] ? ?HPI ? ?He presents to the office today for concern of aching discomfort in bilateral calf muscles.  This has been an ongoing issue for multiple months.  Reports that the only time he feels this aching sensation is when he wakes up in the morning.  He does not feel as though this discomfort wakes him up from sleeping.  Pain is relieved once he is ambulatory.  He walks up proximately 2 miles total a day and does not have any leg fatigue or discomfort with walking. ? ?Has not noticed any wounds or ulcers on his lower extremities.  Denies swelling in his lower extremities past having a sock line present ? ?He has not noticed any discoloration of his legs ? ? ? ?Review of Systems ?See HPI  ? ?Past Medical History:  ?Diagnosis Date  ? Arthritis   ? cervical spine - T7-8 & lumbar area - bone spurs up & down the back   ? Bulging discs   ?  thoracic spine  ? Diverticulosis of colon   ? GERD (gastroesophageal reflux disease)   ? related to intestinal system, - has started Align  4-5 days ago-   ? Hyperlipidemia   ? Hypertension   ? Inguinal hernia   ? OSA (obstructive sleep apnea)   ? saw Dr. Annamaria Boots- 02/2015- pt. remarks that as a result BP med. was changed , pt. has a sleep number bed & he reports the score averages in the 80% range   ? PSA elevation   ? History of   ? Recurrent sinus infections   ? related to fracture nose x2, also reports that he has a deviated septum  ? ? ?Social History  ? ?Socioeconomic History  ? Marital status: Single  ?  Spouse name: Not on file  ? Number of children: 0  ? Years of education: Not on file  ? Highest education level: Not on file  ?Occupational History  ? Occupation: retired  ?Tobacco Use  ? Smoking status: Never  ? Smokeless tobacco: Never  ?Vaping Use  ? Vaping Use: Never used  ?Substance and Sexual Activity  ? Alcohol use: Yes  ?  Alcohol/week: 8.0 - 9.0 standard drinks  ?  Types: 8 - 9  Glasses of wine per week  ?  Comment: social/ drinks wine(white)  ? Drug use: No  ? Sexual activity: Not on file  ?Other Topics Concern  ? Not on file  ?Social History Narrative  ? Retired   ? Single   ? No kids   ?   ? He likes to read, volunteers with an animal rescue group.   ?   ? ?Social Determinants of Health  ? ?Financial Resource Strain: Low Risk   ? Difficulty of Paying Living Expenses: Not hard at all  ?Food Insecurity: No Food Insecurity  ? Worried About Charity fundraiser in the Last Year: Never true  ? Ran Out of Food in the Last Year: Never true  ?Transportation Needs: No Transportation Needs  ? Lack of Transportation (Medical): No  ? Lack of Transportation (Non-Medical): No  ?Physical Activity: Sufficiently Active  ? Days of Exercise per Week: 6 days  ? Minutes of Exercise per Session: 30 min  ?Stress: No Stress Concern Present  ? Feeling of Stress : Not at all  ?Social Connections: Not on file  ?Intimate  Partner Violence: Not on file  ? ? ?Past Surgical History:  ?Procedure Laterality Date  ? COLONOSCOPY  2008  ? 3 internal hemorrhoids, diverticulosis;Dr.Jacobs  ? INSERTION OF MESH N/A 11/13/2016  ? Procedure: INSERTION OF MESH;  Surgeon: Ralene Ok, MD;  Location: [facility] [facility];  Service: General;  Laterality: N/A;  ? LAPAROSCOPIC INGUINAL HERNIA WITH UMBILICAL HERNIA Left 22/29/7989  ? Procedure: LAPAROSCOPIC LEFT INGUINAL HERNIA  AND UMBILICAL HERNIA REPAIRS WITH MESH;  Surgeon: Ralene Ok, MD;  Location: [facility];  Service: General;  Laterality: Left;  ? PROSTATE BIOPSY    ? x 2, once abnormal, once normal  ? TONSILLECTOMY    ? ? ?Family History  ?Problem Relation Age of Onset  ? Lung cancer Maternal Grandfather   ?     enviromental   ? Heart attack Paternal Uncle   ? Prostate cancer Brother   ? ? ?Allergies  ?Allergen Reactions  ? Amoxicillin-Pot Clavulanate Diarrhea and Other (See Comments)  ?  Night sweats, not allergic to Amoxicillin, just can't tolerate augmentin  ? ? ?Current Outpatient  Medications on File Prior to Visit  ?Medication Sig Dispense Refill  ? finasteride (PROSCAR) 5 MG tablet Take 5 mg by mouth daily.    ? fluticasone (FLONASE) 50 MCG/ACT nasal spray USE 1 SPRAY(S) IN EACH NOSTRIL TWICE DAILY AS NEEDED FOR RHINITIS 48 g 3  ? hydrochlorothiazide (HYDRODIURIL) 25 MG tablet Take 1 tablet by mouth once daily 90 tablet 2  ? influenza vaccine adjuvanted (FLUAD) 0.5 ML injection Inject into the muscle. 0.5 mL 0  ? losartan (COZAAR) 50 MG tablet Take 1 tablet by mouth once daily 90 tablet 0  ? Polyvinyl Alcohol-Povidone (REFRESH OP) Place 1 drop as needed into both eyes (for dry eyes).    ? ?No current facility-administered medications on file prior to visit.  ? ? ?BP 122/60   Pulse 97   Temp 99 ?F (37.2 ?C) (Oral)   Ht '5\' 8"'$  (1.727 m)   Wt 227 lb (103 kg)   SpO2 99%   BMI 34.52 kg/m?  ? ? ?   ?Objective:  ? Physical Exam ?Vitals and nursing note reviewed.  ?Constitutional:   ?   Appearance: Normal appearance.  ?Cardiovascular:  ?   Rate and Rhythm: Normal rate and regular rhythm.  ?   Pulses: Normal pulses.     ?     Popliteal pulses are 2+ on the right side and 2+ on the left side.  ?     Dorsalis pedis pulses are 2+ on the right side and 2+ on the left side.  ?     Posterior tibial pulses are 2+ on the right side and 2+ on the left side.  ?   Heart sounds: Normal heart sounds.  ?Pulmonary:  ?   Effort: Pulmonary effort is normal.  ?   Breath sounds: Normal breath sounds.  ?Musculoskeletal:  ?   Right lower leg: Pitting Edema present.  ?   Left lower leg: Pitting Edema present.  ?   Comments: Has trace pitting edema bilaterally from ankle to lower shin  ?Skin: ?   General: Skin is warm and dry.  ?Neurological:  ?   General: No focal deficit present.  ?   Mental Status: He is alert and oriented to person, place, and time.  ?Psychiatric:     ?   Mood and Affect: Mood normal.     ?   Behavior: Behavior normal.     ?  Thought Content: Thought content normal.     ?   Judgment: Judgment  normal.  ? ?   ?Assessment & Plan:  ?1. Bilateral calf pain ?-There is some concern for claudication due to PAD.  Will refer to vein and vascular for further evaluation. ?- Ambulatory referral to Vascular Surgery ? ?Dorothyann Peng, NP ? ?

## 2021-03-20 ENCOUNTER — Other Ambulatory Visit: Payer: Self-pay | Admitting: Adult Health

## 2021-03-20 DIAGNOSIS — I10 Essential (primary) hypertension: Secondary | ICD-10-CM

## 2021-03-24 ENCOUNTER — Encounter: Payer: Self-pay | Admitting: Adult Health

## 2021-03-24 NOTE — Telephone Encounter (Signed)
Called pt and advised that we will look into this Monday. Pt verbalized understanding.  ?

## 2021-03-28 NOTE — Telephone Encounter (Signed)
Reached out to on call referral coordinator to see if she could help with referral information.  ?

## 2021-04-17 ENCOUNTER — Other Ambulatory Visit: Payer: Self-pay | Admitting: *Deleted

## 2021-04-17 DIAGNOSIS — M79606 Pain in leg, unspecified: Secondary | ICD-10-CM

## 2021-04-20 DIAGNOSIS — N401 Enlarged prostate with lower urinary tract symptoms: Secondary | ICD-10-CM | POA: Diagnosis not present

## 2021-04-20 DIAGNOSIS — N138 Other obstructive and reflux uropathy: Secondary | ICD-10-CM | POA: Diagnosis not present

## 2021-04-20 NOTE — Progress Notes (Signed)
?Office Note  ? ? ? ?CC: Concern for bilateral lower extremity claudication ?Requesting Provider:  Dorothyann Peng, NP ? ?HPI: Todd Larson is a 81 y.o. ([birth year]) male presenting at the request of .Nafziger, Tommi Rumps, NP due to concern for bilateral lower extremity claudication. ? ?On exam today, Norvell was doing well.  A native of [place], he moved to [place] roughly 20 years ago.  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  He remains very active, walking over 5000 steps daily.  He is currently moving at roughly 21-minute mile pace.  He denies symptoms of claudication during exercise. ?Hannan notes majority his pain overnight.  He denies pain that wakes him up from sleeping, but does appreciate pain when he wakes up.  The pain is located in his calves, and improves with external rotation of his feet.  Monty uses a sleep number, and feels that it is likely due to his hips sinking further than his feet. ?Pulse pain does not require pain medication, it is not lifestyle limiting, however he wanted to ensure that he did not have vascular disease. ? ?Past Medical History:  ?Diagnosis Date  ? Arthritis   ? cervical spine - T7-8 & lumbar area - bone spurs up & down the back   ? Bulging discs   ?  thoracic spine  ? Diverticulosis of colon   ? GERD (gastroesophageal reflux disease)   ? related to intestinal system, - has started Align  4-5 days ago-   ? Hyperlipidemia   ? Hypertension   ? Inguinal hernia   ? OSA (obstructive sleep apnea)   ? saw Dr. Annamaria Boots- 02/2015- pt. remarks that as a result BP med. was changed , pt. has a sleep number bed & he reports the score averages in the 80% range   ? PSA elevation   ? History of   ? Recurrent sinus infections   ? related to fracture nose x2, also reports that he has a deviated septum  ? ? ?Past Surgical History:  ?Procedure Laterality Date  ? COLONOSCOPY  2008  ?  3 internal hemorrhoids, diverticulosis;Dr.Jacobs  ? INSERTION OF MESH N/A 11/13/2016  ? Procedure: INSERTION OF MESH;  Surgeon: Ralene Ok, MD;  Location: [facility];  Service: General;  Laterality: N/A;  ? LAPAROSCOPIC INGUINAL HERNIA WITH UMBILICAL HERNIA Left 87/68/1157  ? Procedure: LAPAROSCOPIC LEFT INGUINAL HERNIA  AND UMBILICAL HERNIA REPAIRS WITH MESH;  Surgeon: Ralene Ok, MD;  Location: [facility];  Service: General;  Laterality: Left;  ? PROSTATE BIOPSY    ? x 2, once abnormal, once normal  ? TONSILLECTOMY    ? ? ?Social History  ? ?Socioeconomic History  ? Marital status: Single  ?  Spouse name: Not on file  ? Number of children: 0  ? Years of education: Not on file  ? Highest education level: Not on file  ?Occupational History  ? Occupation: retired  ?Tobacco Use  ? Smoking status: Never  ? Smokeless tobacco: Never  ?Vaping Use  ? Vaping Use: Never used  ?Substance and Sexual Activity  ? Alcohol use: Yes  ?  Alcohol/week: 8.0 - 9.0 standard drinks  ?  Types: 8 - 9 Glasses of wine per week  ?  Comment: social/ drinks wine(white)  ? Drug use: No  ? Sexual activity: Not on file  ?Other Topics Concern  ? Not  on file  ?Social History Narrative  ? Retired   ? Single   ? No kids   ?   ? He likes to read, volunteers with an animal rescue group.   ?   ? ?Social Determinants of Health  ? ?Financial Resource Strain: Low Risk   ? Difficulty of Paying Living Expenses: Not hard at all  ?Food Insecurity: No Food Insecurity  ? Worried About Charity fundraiser in the Last Year: Never true  ? Ran Out of Food in the Last Year: Never true  ?Transportation Needs: No Transportation Needs  ? Lack of Transportation (Medical): No  ? Lack of Transportation (Non-Medical): No  ?Physical Activity: Sufficiently Active  ? Days of Exercise per Week: 6 days  ? Minutes of Exercise per Session: 30 min  ?Stress: No Stress Concern Present  ? Feeling of Stress : Not at all  ?Social Connections: Not on file  ?Intimate Partner Violence:  Not on file  ? ?Family History  ?Problem Relation Age of Onset  ? Lung cancer Maternal Grandfather   ?     enviromental   ? Heart attack Paternal Uncle   ? Prostate cancer Brother   ? ? ?Current Outpatient Medications  ?Medication Sig Dispense Refill  ? finasteride (PROSCAR) 5 MG tablet Take 5 mg by mouth daily.    ? fluticasone (FLONASE) 50 MCG/ACT nasal spray USE 1 SPRAY(S) IN EACH NOSTRIL TWICE DAILY AS NEEDED FOR RHINITIS 48 g 3  ? hydrochlorothiazide (HYDRODIURIL) 25 MG tablet Take 1 tablet by mouth once daily 90 tablet 2  ? influenza vaccine adjuvanted (FLUAD) 0.5 ML injection Inject into the muscle. 0.5 mL 0  ? losartan (COZAAR) 50 MG tablet Take 1 tablet by mouth once daily 90 tablet 0  ? Polyvinyl Alcohol-Povidone (REFRESH OP) Place 1 drop as needed into both eyes (for dry eyes).    ? ?No current facility-administered medications for this visit.  ? ? ?Allergies  ?Allergen Reactions  ? Amoxicillin-Pot Clavulanate Diarrhea and Other (See Comments)  ?  Night sweats, not allergic to Amoxicillin, just can't tolerate augmentin  ? ? ? ?REVIEW OF SYSTEMS:  ?'[X]'$  denotes positive finding, '[ ]'$  denotes negative finding ?Cardiac  Comments:  ?Chest pain or chest pressure:    ?Shortness of breath upon exertion:    ?Short of breath when lying flat:    ?Irregular heart rhythm:    ?    ?Vascular    ?Pain in calf, thigh, or hip brought on by ambulation:    ?Pain in feet at night that wakes you up from your sleep:     ?Blood clot in your veins:    ?Leg swelling:     ?    ?Pulmonary    ?Oxygen at home:    ?Productive cough:     ?Wheezing:     ?    ?Neurologic    ?Sudden weakness in arms or legs:     ?Sudden numbness in arms or legs:     ?Sudden onset of difficulty speaking or slurred speech:    ?Temporary loss of vision in one eye:     ?Problems with dizziness:     ?    ?Gastrointestinal    ?Blood in stool:     ?Vomited blood:     ?    ?Genitourinary    ?Burning when urinating:     ?Blood in urine:    ?    ?Psychiatric     ?Major depression:     ?    ?  Hematologic    ?Bleeding problems:    ?Problems with blood clotting too easily:    ?    ?Skin    ?Rashes or ulcers:    ?    ?Constitutional    ?Fever or chills:    ? ? ?PHYSICAL EXAMINATION: ? ?There were no vitals filed for this visit. ? ?General:  WDWN in NAD; vital signs documented above ?Gait: Not observed ?HENT: WNL, normocephalic ?Pulmonary: normal non-labored breathing , without wheezing ?Cardiac: regular HR ?Abdomen: soft, NT, no masses ?Skin: without rashes ?Vascular Exam/Pulses: ? Right Left  ?Radial 2+ (normal) 2+ (normal)  ?Ulnar    ?Femoral    ?Popliteal    ?DP 2+ (normal) 2+ (normal)  ?PT    ? ?Extremities: without ischemic changes, without Gangrene , without cellulitis; without open wounds;  ?Musculoskeletal: no muscle wasting or atrophy  ?Neurologic: A&O X 3;  No focal weakness or paresthesias are detected ?Psychiatric:  The pt has Normal affect. ? ? ?Non-Invasive Vascular Imaging:   ?ABI Findings:  ?+---------+------------------+-----+---------+--------+  ?Right    Rt Pressure (mmHg)IndexWaveform Comment   ?+---------+------------------+-----+---------+--------+  ?Brachial 138                                       ?+---------+------------------+-----+---------+--------+  ?PTA      177               1.27 triphasic          ?+---------+------------------+-----+---------+--------+  ?DP       177               1.27 triphasic          ?+---------+------------------+-----+---------+--------+  ?Great Toe134               0.96 Normal             ?+---------+------------------+-----+---------+--------+  ? ?+---------+------------------+-----+---------+-------+  ?Left     Lt Pressure (mmHg)IndexWaveform Comment  ?+---------+------------------+-----+---------+-------+  ?Brachial 139                                      ?+---------+------------------+-----+---------+-------+  ?PTA      164               1.18 triphasic          ?+---------+------------------+-----+---------+-------+  ?DP       147               1.06 triphasic         ?+---------+------------------+-----+---------+-------+  ?Great Toe130               0.94 Normal            ?+---------+------------------+-----+---------+

## 2021-04-21 ENCOUNTER — Encounter: Payer: Self-pay | Admitting: Vascular Surgery

## 2021-04-21 ENCOUNTER — Ambulatory Visit (HOSPITAL_COMMUNITY)
Admission: RE | Admit: 2021-04-21 | Discharge: 2021-04-21 | Disposition: A | Discharge status: Home / Self Care | Payer: Medicare Other | Source: Ambulatory Visit | Attending: Vascular Surgery | Admitting: Vascular Surgery

## 2021-04-21 ENCOUNTER — Ambulatory Visit (INDEPENDENT_AMBULATORY_CARE_PROVIDER_SITE_OTHER): Payer: Medicare Other | Admitting: Vascular Surgery

## 2021-04-21 VITALS — BP 126/73 | HR 88 | Temp 97.9°F | Resp 16 | Ht 68.0 in | Wt 224.0 lb

## 2021-04-21 DIAGNOSIS — M79662 Pain in left lower leg: Secondary | ICD-10-CM

## 2021-04-21 DIAGNOSIS — M79606 Pain in leg, unspecified: Secondary | ICD-10-CM | POA: Diagnosis not present

## 2021-04-21 DIAGNOSIS — M79661 Pain in right lower leg: Secondary | ICD-10-CM

## 2021-05-01 ENCOUNTER — Telehealth: Payer: Self-pay

## 2021-05-01 NOTE — Telephone Encounter (Signed)
---  Caller states he had work done on his HVAC ?yesterday and is feeling lightheaded. There is a freon ?leak. Lightheadedness is mild and started this morning. ?Also has a hx of allergies. O2 sat is 95-97. HR 85 ?normal is in the 70's ? ? ?04/29/2021 10:22:32 AM [facility] Zenia Resides, RN, Diane ? ?Care Advice Given Per Guideline ?CALL [facility] NOW: * You need to call the [facility] now. [place] NUMBER: * ?[place]: [place] given per Smoke and Fume Inhalation (Adult) guideline ? ?05/01/21 at 1602: Pt states maintenance team worked on [employer] & was informed that there was a freon leak. States he called poison center & was told that freon is an irritant, but unless he was exposed to a very large quantity there was no danger. Pt states he's feeling better today. ? ?

## 2021-06-01 DIAGNOSIS — H2513 Age-related nuclear cataract, bilateral: Secondary | ICD-10-CM | POA: Diagnosis not present

## 2021-06-09 ENCOUNTER — Other Ambulatory Visit: Payer: Self-pay | Admitting: Adult Health

## 2021-06-09 DIAGNOSIS — I10 Essential (primary) hypertension: Secondary | ICD-10-CM

## 2021-08-04 ENCOUNTER — Ambulatory Visit (INDEPENDENT_AMBULATORY_CARE_PROVIDER_SITE_OTHER): Payer: Medicare Other | Admitting: Adult Health

## 2021-08-04 ENCOUNTER — Encounter: Payer: Self-pay | Admitting: Adult Health

## 2021-08-04 VITALS — BP 120/70 | HR 97 | Temp 98.8°F | Ht 67.5 in | Wt 224.0 lb

## 2021-08-04 DIAGNOSIS — I10 Essential (primary) hypertension: Secondary | ICD-10-CM

## 2021-08-04 DIAGNOSIS — R7303 Prediabetes: Secondary | ICD-10-CM

## 2021-08-04 DIAGNOSIS — E782 Mixed hyperlipidemia: Secondary | ICD-10-CM

## 2021-08-04 DIAGNOSIS — N400 Enlarged prostate without lower urinary tract symptoms: Secondary | ICD-10-CM | POA: Diagnosis not present

## 2021-08-04 LAB — CBC WITH DIFFERENTIAL/PLATELET
Basophils Absolute: 0.1 10*3/uL (ref 0.0–0.1)
Basophils Relative: 1.1 % (ref 0.0–3.0)
Eosinophils Absolute: 0.2 10*3/uL (ref 0.0–0.7)
Eosinophils Relative: 2.8 % (ref 0.0–5.0)
HCT: 46.1 % (ref 39.0–52.0)
Hemoglobin: 16.1 g/dL (ref 13.0–17.0)
Lymphocytes Relative: 21.2 % (ref 12.0–46.0)
Lymphs Abs: 1.7 10*3/uL (ref 0.7–4.0)
MCHC: 34.9 g/dL (ref 30.0–36.0)
MCV: 94.1 fl (ref 78.0–100.0)
Monocytes Absolute: 0.8 10*3/uL (ref 0.1–1.0)
Monocytes Relative: 9.2 % (ref 3.0–12.0)
Neutro Abs: 5.4 10*3/uL (ref 1.4–7.7)
Neutrophils Relative %: 65.7 % (ref 43.0–77.0)
Platelets: 303 10*3/uL (ref 150.0–400.0)
RBC: 4.9 Mil/uL (ref 4.22–5.81)
RDW: 12.8 % (ref 11.5–15.5)
WBC: 8.2 10*3/uL (ref 4.0–10.5)

## 2021-08-04 LAB — COMPREHENSIVE METABOLIC PANEL
ALT: 28 U/L (ref 0–53)
AST: 20 U/L (ref 0–37)
Albumin: 4.5 g/dL (ref 3.5–5.2)
Alkaline Phosphatase: 53 U/L (ref 39–117)
BUN: 13 mg/dL (ref 6–23)
CO2: 29 mEq/L (ref 19–32)
Calcium: 9.3 mg/dL (ref 8.4–10.5)
Chloride: 103 mEq/L (ref 96–112)
Creatinine, Ser: 0.97 mg/dL (ref 0.40–1.50)
GFR: 74.49 mL/min (ref >60.00)
Glucose, Bld: 113 mg/dL — ABNORMAL HIGH (ref 70–99)
Potassium: 4.3 mEq/L (ref 3.5–5.1)
Sodium: 139 mEq/L (ref 135–145)
Total Bilirubin: 1.6 mg/dL — ABNORMAL HIGH (ref 0.2–1.2)
Total Protein: 6.9 g/dL (ref 6.0–8.3)

## 2021-08-04 LAB — HEMOGLOBIN A1C: Hgb A1c MFr Bld: 5.4 % (ref 4.6–6.5)

## 2021-08-04 LAB — LIPID PANEL
Cholesterol: 141 mg/dL (ref 0–200)
HDL: 47.8 mg/dL (ref >39.00)
LDL Cholesterol: 81 mg/dL (ref 0–99)
NonHDL: 93.32
Total CHOL/HDL Ratio: 3
Triglycerides: 61 mg/dL (ref 0.0–149.0)
VLDL: 12.2 mg/dL (ref 0.0–40.0)

## 2021-08-04 LAB — TSH: TSH: 2.35 u[IU]/mL (ref 0.35–5.50)

## 2021-08-04 NOTE — Patient Instructions (Signed)
It was great seeing you today   We will follow up with you regarding your lab work   Please let me know if you need anything   

## 2021-08-04 NOTE — Progress Notes (Signed)
Subjective:    Patient ID: Todd Larson, male    DOB: 08-Sep-1940, 81 y.o.   MRN: [id number]  HPI Patient presents for yearly preventative medicine examination. He is a pleasant 81 year old male who  has a past medical history of Arthritis, Bulging discs, Diverticulosis of colon, GERD (gastroesophageal reflux disease), Hyperlipidemia, Hypertension, Inguinal hernia, OSA (obstructive sleep apnea), PSA elevation, and Recurrent sinus infections.  Hypertension-managed with losartan 50 mg daily and HCTZ 25 mg daily.  He denies dizziness, lightheadedness, chest pain, or shortness of breath BP Readings from Last 3 Encounters:  08/04/21 120/70  04/21/21 126/73  03/16/21 122/60   Hyperlipidemia-diet controlled.  Not currently on any medication Lab Results  Component Value Date   CHOL 151 07/28/2020   HDL 52.60 07/28/2020   LDLCALC 86 07/28/2020   TRIG 62.0 07/28/2020   CHOLHDL 3 07/28/2020   Glucose intolerance-managed with diet. Lab Results  Component Value Date   HGBA1C 5.3 07/28/2020   BPH-takes Proscar 5 mg daily, this is managed by urology.  He does feel well controlled  All immunizations and health maintenance protocols were reviewed with the patient and needed orders were placed.  Appropriate screening laboratory values were ordered for the patient including screening of hyperlipidemia, renal function and hepatic function. If indicated by BPH, a PSA was ordered.  Medication reconciliation,  past medical history, social history, problem list and allergies were reviewed in detail with the patient  Goals were established with regard to weight loss, exercise, and  diet in compliance with medications. He is walking about 2.3 miles on average a day.   Wt Readings from Last 3 Encounters:  08/04/21 224 lb (101.6 kg)  04/21/21 224 lb (101.6 kg)  03/16/21 227 lb (103 kg)   He feels well overall and has no complaints   Review of Systems  Constitutional: Negative.   HENT: Negative.     Eyes: Negative.   Respiratory: Negative.    Cardiovascular: Negative.   Gastrointestinal: Negative.   Endocrine: Negative.   Genitourinary: Negative.   Musculoskeletal:  Positive for arthralgias.  Skin: Negative.   Allergic/Immunologic: Negative.   Neurological: Negative.   Hematological: Negative.   Psychiatric/Behavioral: Negative.    All other systems reviewed and are negative.  Past Medical History:  Diagnosis Date   Arthritis    cervical spine - T7-8 & lumbar area - bone spurs up & down the back    Bulging discs     thoracic spine   Diverticulosis of colon    GERD (gastroesophageal reflux disease)    related to intestinal system, - has started Align  4-5 days ago-    Hyperlipidemia    Hypertension    Inguinal hernia    OSA (obstructive sleep apnea)    saw Dr. Annamaria Boots- 02/2015- pt. remarks that as a result BP med. was changed , pt. has a sleep number bed & he reports the score averages in the 80% range    PSA elevation    History of    Recurrent sinus infections    related to fracture nose x2, also reports that he has a deviated septum    Social History   Socioeconomic History   Marital status: Single    Spouse name: Not on file   Number of children: 0   Years of education: Not on file   Highest education level: Not on file  Occupational History   Occupation: retired  Tobacco Use   Smoking status: Never  Smokeless tobacco: Never  Vaping Use   Vaping Use: Never used  Substance and Sexual Activity   Alcohol use: Yes    Alcohol/week: 8.0 - 9.0 standard drinks of alcohol    Types: 8 - 9 Glasses of wine per week    Comment: social/ drinks wine(white)   Drug use: No   Sexual activity: Not on file  Other Topics Concern   Not on file  Social History Narrative   Retired    Single    No kids       He likes to read, volunteers with an animal rescue group.       Social Determinants of Health   Financial Resource Strain: Low Risk  (11/29/2020)   Overall  Financial Resource Strain (CARDIA)    Difficulty of Paying Living Expenses: Not hard at all  Food Insecurity: No Food Insecurity (11/29/2020)   Hunger Vital Sign    Worried About Running Out of Food in the Last Year: Never true    Ran Out of Food in the Last Year: Never true  Transportation Needs: No Transportation Needs (11/29/2020)   PRAPARE - Hydrologist (Medical): No    Lack of Transportation (Non-Medical): No  Physical Activity: Sufficiently Active (11/29/2020)   Exercise Vital Sign    Days of Exercise per Week: 6 days    Minutes of Exercise per Session: 30 min  Stress: No Stress Concern Present (11/29/2020)   [place]    Feeling of Stress : Not at all  Social Connections: Socially Isolated (11/24/2019)   Social Connection and Isolation Panel [NHANES]    Frequency of Communication with Friends and Family: More than three times a week    Frequency of Social Gatherings with Friends and Family: Never    Attends Religious Services: Never    Marine scientist or Organizations: No    Attends Archivist Meetings: Never    Marital Status: Never married  Intimate Partner Violence: Not At Risk (11/24/2019)   Humiliation, Afraid, Rape, and Kick questionnaire    Fear of Current or Ex-Partner: No    Emotionally Abused: No    Physically Abused: No    Sexually Abused: No    Past Surgical History:  Procedure Laterality Date   COLONOSCOPY  2008   3 internal hemorrhoids, diverticulosis;Dr.Jacobs   INSERTION OF MESH N/A 11/13/2016   Procedure: INSERTION OF MESH;  Surgeon: Ralene Ok, MD;  Location: [place];  Service: General;  Laterality: N/A;   LAPAROSCOPIC INGUINAL HERNIA WITH UMBILICAL HERNIA Left 36/12/2447   Procedure: LAPAROSCOPIC LEFT INGUINAL HERNIA  AND UMBILICAL HERNIA REPAIRS WITH MESH;  Surgeon: Ralene Ok, MD;  Location: [place];  Service: General;   Laterality: Left;   PROSTATE BIOPSY     x 2, once abnormal, once normal   TONSILLECTOMY      Family History  Problem Relation Age of Onset   Lung cancer Maternal Grandfather        enviromental    Heart attack Paternal Uncle    Prostate cancer Brother     Allergies  Allergen Reactions   Amoxicillin-Pot Clavulanate Diarrhea and Other (See Comments)    Night sweats, not allergic to Amoxicillin, just can't tolerate augmentin    Current Outpatient Medications on File Prior to Visit  Medication Sig Dispense Refill   finasteride (PROSCAR) 5 MG tablet Take 5 mg by mouth daily.     fluticasone (FLONASE)  50 MCG/ACT nasal spray USE 1 SPRAY(S) IN EACH NOSTRIL TWICE DAILY AS NEEDED FOR RHINITIS 48 g 3   hydrochlorothiazide (HYDRODIURIL) 25 MG tablet Take 1 tablet by mouth once daily 90 tablet 2   losartan (COZAAR) 50 MG tablet Take 1 tablet by mouth once daily 90 tablet 0   Polyvinyl Alcohol-Povidone (REFRESH OP) Place 1 drop as needed into both eyes (for dry eyes).     No current facility-administered medications on file prior to visit.    BP 120/70   Pulse 97   Temp 98.8 F (37.1 C) (Oral)   Ht 5' 7.5" (1.715 m)   Wt 224 lb (101.6 kg)   SpO2 97%   BMI 34.57 kg/m       Objective:   Physical Exam Vitals and nursing note reviewed.  Constitutional:      General: He is not in acute distress.    Appearance: Normal appearance. He is well-developed. He is obese.  HENT:     Head: Normocephalic and atraumatic.     Right Ear: Tympanic membrane, ear canal and external ear normal. There is no impacted cerumen.     Left Ear: Tympanic membrane, ear canal and external ear normal. There is no impacted cerumen.     Nose: Nose normal. No congestion or rhinorrhea.     Mouth/Throat:     Mouth: Mucous membranes are moist.     Pharynx: Oropharynx is clear. No oropharyngeal exudate or posterior oropharyngeal erythema.  Eyes:     General:        Right eye: No discharge.        Left eye: No  discharge.     Extraocular Movements: Extraocular movements intact.     Conjunctiva/sclera: Conjunctivae normal.     Pupils: Pupils are equal, round, and reactive to light.  Neck:     Vascular: No carotid bruit.     Trachea: No tracheal deviation.  Cardiovascular:     Rate and Rhythm: Normal rate and regular rhythm.     Pulses: Normal pulses.     Heart sounds: Normal heart sounds. No murmur heard.    No friction rub. No gallop.  Pulmonary:     Effort: Pulmonary effort is normal. No respiratory distress.     Breath sounds: Normal breath sounds. No stridor. No wheezing, rhonchi or rales.  Chest:     Chest wall: No tenderness.  Abdominal:     General: Bowel sounds are normal. There is no distension.     Palpations: Abdomen is soft. There is no mass.     Tenderness: There is no abdominal tenderness. There is no right CVA tenderness, left CVA tenderness, guarding or rebound.     Hernia: No hernia is present.  Musculoskeletal:        General: No swelling, tenderness, deformity or signs of injury. Normal range of motion.     Right lower leg: No edema.     Left lower leg: No edema.  Lymphadenopathy:     Cervical: No cervical adenopathy.  Skin:    General: Skin is warm and dry.     Capillary Refill: Capillary refill takes less than 2 seconds.     Coloration: Skin is not jaundiced or pale.     Findings: No bruising, erythema, lesion or rash.  Neurological:     General: No focal deficit present.     Mental Status: He is alert and oriented to person, place, and time.     Cranial Nerves: No cranial  nerve deficit.     Sensory: No sensory deficit.     Motor: No weakness.     Coordination: Coordination normal.     Gait: Gait normal.     Deep Tendon Reflexes: Reflexes normal.  Psychiatric:        Mood and Affect: Mood normal.        Behavior: Behavior normal.        Thought Content: Thought content normal.        Judgment: Judgment normal.       Assessment & Plan:   1. Essential  hypertension - well controlled. No change in medications  - CBC with Differential/Platelet; Future - Comprehensive metabolic panel; Future - Hemoglobin A1c; Future - Lipid panel; Future - TSH; Future  2. Mixed hyperlipidemia - Consider statin  - CBC with Differential/Platelet; Future - Comprehensive metabolic panel; Future - Hemoglobin A1c; Future - Lipid panel; Future - TSH; Future  3. Pre-diabetes - Consider metformin  - CBC with Differential/Platelet; Future - Comprehensive metabolic panel; Future - Hemoglobin A1c; Future - Lipid panel; Future - TSH; Future  4. Benign prostatic hyperplasia without lower urinary tract symptoms - Controlled.  - Follow up with urology as directed   Dorothyann Peng, NP

## 2021-09-14 ENCOUNTER — Other Ambulatory Visit: Payer: Self-pay | Admitting: Adult Health

## 2021-09-14 DIAGNOSIS — I10 Essential (primary) hypertension: Secondary | ICD-10-CM

## 2021-09-21 ENCOUNTER — Ambulatory Visit (INDEPENDENT_AMBULATORY_CARE_PROVIDER_SITE_OTHER): Payer: Medicare Other | Admitting: Adult Health

## 2021-09-21 ENCOUNTER — Encounter: Payer: Self-pay | Admitting: Adult Health

## 2021-09-21 VITALS — BP 130/60 | HR 108 | Temp 98.2°F | Ht 67.5 in | Wt 222.0 lb

## 2021-09-21 DIAGNOSIS — M79661 Pain in right lower leg: Secondary | ICD-10-CM

## 2021-09-21 DIAGNOSIS — R0981 Nasal congestion: Secondary | ICD-10-CM | POA: Diagnosis not present

## 2021-09-21 DIAGNOSIS — M79662 Pain in left lower leg: Secondary | ICD-10-CM
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
## 2021-10-01 DIAGNOSIS — Z23 Encounter for immunization: Secondary | ICD-10-CM | POA: Diagnosis not present

## 2021-10-02 ENCOUNTER — Emergency Department (HOSPITAL_COMMUNITY): Payer: Medicare Other

## 2021-10-02 ENCOUNTER — Other Ambulatory Visit: Payer: Self-pay

## 2021-10-02 ENCOUNTER — Emergency Department (HOSPITAL_COMMUNITY)
Admission: EM | Admit: 2021-10-02 | Discharge: 2021-10-02 | Disposition: A | Discharge status: Home / Self Care | Payer: Medicare Other | Attending: Emergency Medicine | Admitting: Emergency Medicine

## 2021-10-02 ENCOUNTER — Encounter (HOSPITAL_COMMUNITY): Payer: Self-pay | Admitting: Emergency Medicine

## 2021-10-02 DIAGNOSIS — I959 Hypotension, unspecified: Secondary | ICD-10-CM | POA: Diagnosis not present

## 2021-10-02 DIAGNOSIS — R509 Fever, unspecified: Secondary | ICD-10-CM | POA: Diagnosis not present

## 2021-10-02 DIAGNOSIS — R42 Dizziness and giddiness: Secondary | ICD-10-CM | POA: Diagnosis not present

## 2021-10-02 DIAGNOSIS — R000 Tachycardia, unspecified: Secondary | ICD-10-CM | POA: Diagnosis not present

## 2021-10-02 DIAGNOSIS — T50Z95A Adverse effect of other vaccines and biological substances, initial encounter: Secondary | ICD-10-CM | POA: Insufficient documentation

## 2021-10-02 DIAGNOSIS — T887XXA Unspecified adverse effect of drug or medicament, initial encounter: Secondary | ICD-10-CM | POA: Insufficient documentation

## 2021-10-02 DIAGNOSIS — T50B95A Adverse effect of other viral vaccines, initial encounter: Secondary | ICD-10-CM

## 2021-10-02 MED ORDER — ACETAMINOPHEN 500 MG PO TABS
1000.0000 mg | ORAL_TABLET | Freq: Once | ORAL | Status: AC
  Administered 2021-10-02: 1000 mg via ORAL
  Filled 2021-10-02: qty 2

## 2021-10-02 NOTE — ED Provider Notes (Signed)
[facility] EMERGENCY DEPARTMENT Provider Note   CSN: [id number] Arrival date & time: 10/02/21  0532     History  Chief Complaint  Patient presents with   Chills/Tachycardia     After Covid Vaccination     Todd Larson is a 81 y.o. male.  81 year old male with a history of hypertension, hyperlipidemia, and BPH who presents to the emergency department with fever tachycardia.  Patient reports that he got the [place] booster yesterday at noon.  Reports that last night he started feeling chills and fevers.  Reports his heart rate was elevated on his watch up to 140 bpm and decided to come into the emergency department for evaluation.  Says that his heart rate had improved spontaneously to the 120s at the time of presentation to the emergency department.  Has not taken any antipyretics at home.  Says that he is not having any chest pain, shortness of breath, dysuria, frequency, cough.  No history of arrhythmia.  No history of DVT or PE.  No melena or hematochezia.       Home Medications Prior to Admission medications   Medication Sig Start Date End Date Taking? Authorizing Provider  finasteride (PROSCAR) 5 MG tablet Take 5 mg by mouth daily.    [provider]  fluticasone (FLONASE) 50 MCG/ACT nasal spray USE 1 SPRAY(S) IN EACH NOSTRIL TWICE DAILY AS NEEDED FOR RHINITIS 07/22/19   Nafziger, Tommi Rumps, NP  hydrochlorothiazide (HYDRODIURIL) 25 MG tablet Take 1 tablet by mouth once daily 01/11/21   Dorothyann Peng, NP  losartan (COZAAR) 50 MG tablet Take 1 tablet by mouth once daily 09/15/21   Nafziger, Tommi Rumps, NP  Polyvinyl Alcohol-Povidone (REFRESH OP) Place 1 drop as needed into both eyes (for dry eyes).    [provider]      Allergies    Amoxicillin-pot clavulanate    Review of Systems   Review of Systems  Physical Exam Updated Vital Signs BP 122/70   Pulse 89   Temp 99.2 F (37.3 C) (Oral)   Resp 17   SpO2 100%  Physical Exam Vitals and  nursing note reviewed.  Constitutional:      General: He is not in acute distress.    Appearance: He is well-developed.  HENT:     Head: Normocephalic and atraumatic.     Right Ear: External ear normal.     Left Ear: External ear normal.     Nose: Nose normal.  Eyes:     Extraocular Movements: Extraocular movements intact.     Conjunctiva/sclera: Conjunctivae normal.     Pupils: Pupils are equal, round, and reactive to light.  Cardiovascular:     Rate and Rhythm: Normal rate and regular rhythm.     Heart sounds: Normal heart sounds.  Pulmonary:     Effort: Pulmonary effort is normal. No respiratory distress.     Breath sounds: Normal breath sounds.  Abdominal:     General: There is no distension.     Palpations: Abdomen is soft. There is no mass.     Tenderness: There is no abdominal tenderness. There is no guarding.  Musculoskeletal:        General: No swelling.     Cervical back: Normal range of motion and neck supple.     Right lower leg: No edema.     Left lower leg: No edema.  Skin:    General: Skin is warm and dry.     Capillary Refill: Capillary refill  takes less than 2 seconds.  Neurological:     Mental Status: He is alert. Mental status is at baseline.  Psychiatric:        Mood and Affect: Mood normal.        Behavior: Behavior normal.     ED Results / Procedures / Treatments   Labs (all labs ordered are listed, but only abnormal results are displayed) Labs Reviewed - No data to display  EKG EKG Interpretation  Date/Time:  Monday October 02 2021 05:38:49 EDT Ventricular Rate:  122 PR Interval:  148 QRS Duration: 76 QT Interval:  306 QTC Calculation: 436 R Axis:   130 Text Interpretation: Sinus tachycardia Right axis deviation Low voltage QRS Abnormal ECG No previous ECGs available Confirmed by Margaretmary Eddy [phone]) on 10/02/2021 9:54:47 AM  Radiology DG Chest 2 View  Result Date: 10/02/2021 CLINICAL DATA:  Fever, chills and tachycardia. Recent  COVID vaccination. EXAM: CHEST - 2 VIEW COMPARISON:  None Available. FINDINGS: Suboptimal inspiration, specially on the lateral view. Allowing for this, the heart size and mediastinal contours are normal. Patchy left basilar airspace disease favoring atelectasis. The right lung appears clear. No pleural effusion or pneumothorax. In azygous fissure is noted. There are mild degenerative changes in the spine. No acute osseous findings are seen. Telemetry leads overlie the chest. IMPRESSION: Suboptimal inspiration with patchy left basilar airspace disease favoring atelectasis. Electronically Signed   By: Richardean Sale M.D.   On: 10/02/2021 10:58    Procedures Procedures   Medications Ordered in ED Medications  acetaminophen (TYLENOL) tablet 1,000 mg (1,000 mg Oral Given 10/02/21 3532)    ED Course/ Medical Decision Making/ A&P                           Medical Decision Making Amount and/or Complexity of Data Reviewed Radiology: ordered.   Todd Larson is a 81 y.o. male with comorbidities that complicate the patient evaluation including hypertension, hyperlipidemia, BPH who presents with chief complaint of fever and tachycardia.  This patient presents to the ED for concern of complaints listed in HPI, this involves an extensive number of treatment options, and is a complaint that carries with it a high risk of complications and morbidity.   Initial Ddx:  Sepsis, adverse reaction to vaccine, pulmonary embolism, arrhythmia  MDM:  Feel the patient is likely having an adverse reaction to his Moderna vaccine.  Suspect that this tachycardia is due to an inflammatory response afterwards as well as his fever.  No other infectious symptoms at this time that would be concerning for sepsis.  No chest pain or shortness of breath that would be concerning for a pulmonary embolism.  Will obtain EKG to evaluate for possible arrhythmia.  Plan:  EKG  ED Summary:  Patient had EKG which did show tight sinus  tachycardia.  No other evidence of ischemia or arrhythmia.  Also had chest x-ray that was ordered prior to my evaluation of the patient which showed atelectasis.  Given the patient's lack of pulmonary symptoms feel that this is still more consistent with an adverse reaction to his vaccine.  Did do shared decision making with the patient regarding lab work versus watchful waiting and follow-up with his primary doctor.  He opted to follow-up with his primary doctor in 2 to 3 days and take Tylenol around-the-clock as needed for his fever and tachycardia.    Dispo: DC Home. Return precautions discussed including, but not limited to,  those listed in the AVS. Allowed pt time to ask questions which were answered fully prior to dc.   Records reviewed Outpatient Clinic Notes I personally reviewed and interpreted cardiac monitoring: normal sinus rhythm  I personally reviewed and interpreted the pt's EKG: see above for interpretation  I have reviewed the patients home medications and made adjustments as needed  Final Clinical Impression(s) / ED Diagnoses Final diagnoses:  Fever, unspecified  Sinus tachycardia  Adverse reaction to COVID-19 vaccine    Rx / DC Orders ED Discharge Orders     None         Fransico Meadow, MD 10/02/21 1123

## 2021-10-02 NOTE — ED Provider Triage Note (Signed)
  Emergency Medicine Provider Triage Evaluation Note  MRN:  [id number]  Arrival date & time: 10/02/21    Medically screening exam initiated at 5:38 AM.   CC:   Immunization reaction  HPI:  Todd Larson is a 81 y.o. year-old male presents to the ED with chief complaint of chills and tachycardia.  Got COVID shot yesterday.  Apple watch awakened him from sleep due to tachycardia.  100.4 temp in triage.  States he feels a bit lightheaded.  History provided by patient. ROS:  -As included in HPI PE:   Vitals:   10/02/21 0536  BP: 121/66  Pulse: (!) 122  Resp: 18  Temp: (!) 100.4 F (38 C)  SpO2: 94%    Non-toxic appearing No respiratory distress Tachycardic MDM:  Based on signs and symptoms, immune response to vaccine is highest on my differential, followed by covid. I've ordered EKG and tylenol in triage to expedite lab/diagnostic workup.  Patient was informed that the remainder of the evaluation will be completed by another provider, this initial triage assessment does not replace that evaluation, and the importance of remaining in the ED until their evaluation is complete.    Montine Circle, PA-C 10/02/21 [phone]

## 2021-10-02 NOTE — Discharge Instructions (Signed)
Today you were seen in the emergency department for your elevated heart rate and fever.    In the emergency department you had a evaluation that was reassuring.    At home, please take Tylenol for your fever.  Typically this effect from vaccines lasts for 2 to 3 days.  Stay well-hydrated at home.  Follow-up with your primary doctor in 2-3 days regarding your visit.    Return immediately to the emergency department if you experience any of the following: Shortness of breath, chest pain, dizziness, or any other concerning symptoms.    Thank you for visiting our Emergency Department. It was a pleasure taking care of you today.

## 2021-10-02 NOTE — ED Notes (Signed)
Pt reports he had 6th Covid vaccine yesterday, 1st Moderna. Pt reports chills and low grade fever with tachycardia up to 140 overnight. No pain or shob at this time.

## 2021-10-02 NOTE — ED Notes (Signed)
Patient verbalizes understanding of discharge instructions. Opportunity for questioning and answers were provided. Pt discharged from ED. 

## 2021-10-02 NOTE — ED Triage Notes (Signed)
Patient received Covid vaccination yesterday , reports chills /lightheaded and tachycardic this morning ( 120+)

## 2021-10-03 ENCOUNTER — Other Ambulatory Visit: Payer: Self-pay | Admitting: Adult Health

## 2021-10-03 DIAGNOSIS — I10 Essential (primary) hypertension: Secondary | ICD-10-CM

## 2021-10-04 ENCOUNTER — Ambulatory Visit (INDEPENDENT_AMBULATORY_CARE_PROVIDER_SITE_OTHER): Payer: Medicare Other | Admitting: Adult Health

## 2021-10-04 VITALS — BP 132/84 | HR 90 | Temp 98.2°F | Wt 220.0 lb

## 2021-10-04 DIAGNOSIS — J309 Allergic rhinitis, unspecified: Secondary | ICD-10-CM

## 2021-10-04 DIAGNOSIS — H1010 Acute atopic conjunctivitis, unspecified eye: Secondary | ICD-10-CM

## 2021-10-04 DIAGNOSIS — T50Z95D Adverse effect of other vaccines and biological substances, subsequent encounter: Secondary | ICD-10-CM | POA: Diagnosis not present

## 2021-10-04 MED ORDER — FLUTICASONE PROPIONATE 50 MCG/ACT NA SUSP: NASAL | 0 refills | Status: DC

## 2021-10-04 NOTE — Progress Notes (Signed)
Subjective:    Patient ID: Todd Larson, male    DOB: 19-Jan-1940, 81 y.o.   MRN: [id number]  HPI 81 year old male who  has a past medical history of Arthritis, Bulging discs, Diverticulosis of colon, GERD (gastroesophageal reflux disease), Hyperlipidemia, Hypertension, Inguinal hernia, OSA (obstructive sleep apnea), PSA elevation, and Recurrent sinus infections.  He presents to the office today for follow-up after being seen in the emergency room 2 days ago.  He presented with fever and tachycardia.  He reported that he got the COVID Moderna booster the day prior at noon, that night he started to experience chills and fever.  He reported that his heart rate was elevated on his watch up to 140 BPM decided to come into the emergency department for evaluation.  When he was seen in the emergency room he reported that his heart rate improved spontaneously to the 120s.  He had not taken any antipyretics at home.  Was not having any chest pain, shortness of breath, dysuria, frequency, or cough.  His EKG showed sinus tachycardia with a rate of 120's.  Chest x-ray did not show any acute abnormalities.  Felt that he was likely having an adverse reaction to his COVID vaccination.  Today he reports that he feels much better and his heart rate has gone down to his normal level between 70-114. He has not had any additional fevers   He needs a refill on flonase   Review of Systems See HPI   Past Medical History:  Diagnosis Date   Arthritis    cervical spine - T7-8 & lumbar area - bone spurs up & down the back    Bulging discs     thoracic spine   Diverticulosis of colon    GERD (gastroesophageal reflux disease)    related to intestinal system, - has started Align  4-5 days ago-    Hyperlipidemia    Hypertension    Inguinal hernia    OSA (obstructive sleep apnea)    saw Dr. Annamaria Boots- 02/2015- pt. remarks that as a result BP med. was changed , pt. has a sleep number bed & he reports the score averages  in the 80% range    PSA elevation    History of    Recurrent sinus infections    related to fracture nose x2, also reports that he has a deviated septum    Social History   Socioeconomic History   Marital status: Single    Spouse name: Not on file   Number of children: 0   Years of education: Not on file   Highest education level: Not on file  Occupational History   Occupation: retired  Tobacco Use   Smoking status: Never   Smokeless tobacco: Never  Vaping Use   Vaping Use: Never used  Substance and Sexual Activity   Alcohol use: Yes    Alcohol/week: 8.0 - 9.0 standard drinks of alcohol    Types: 8 - 9 Glasses of wine per week    Comment: social/ drinks wine(white)   Drug use: No   Sexual activity: Not on file  Other Topics Concern   Not on file  Social History Narrative   Retired    Single    No kids       He likes to read, volunteers with an animal rescue group.       Social Determinants of Health   Financial Resource Strain: Low Risk  (11/29/2020)   Overall Financial Resource  Strain (CARDIA)    Difficulty of Paying Living Expenses: Not hard at all  Food Insecurity: No Food Insecurity (11/29/2020)   Hunger Vital Sign    Worried About Running Out of Food in the Last Year: Never true    Ran Out of Food in the Last Year: Never true  Transportation Needs: No Transportation Needs (11/29/2020)   PRAPARE - Hydrologist (Medical): No    Lack of Transportation (Non-Medical): No  Physical Activity: Sufficiently Active (11/29/2020)   Exercise Vital Sign    Days of Exercise per Week: 6 days    Minutes of Exercise per Session: 30 min  Stress: No Stress Concern Present (11/29/2020)   Golf    Feeling of Stress : Not at all  Social Connections: Socially Isolated (11/24/2019)   Social Connection and Isolation Panel [NHANES]    Frequency of Communication with Friends and  Family: More than three times a week    Frequency of Social Gatherings with Friends and Family: Never    Attends Religious Services: Never    Marine scientist or Organizations: No    Attends Archivist Meetings: Never    Marital Status: Never married  Intimate Partner Violence: Not At Risk (11/24/2019)   Humiliation, Afraid, Rape, and Kick questionnaire    Fear of Current or Ex-Partner: No    Emotionally Abused: No    Physically Abused: No    Sexually Abused: No    Past Surgical History:  Procedure Laterality Date   COLONOSCOPY  2008   3 internal hemorrhoids, diverticulosis;Dr.Jacobs   INSERTION OF MESH N/A 11/13/2016   Procedure: INSERTION OF MESH;  Surgeon: Ralene Ok, MD;  Location: [facility];  Service: General;  Laterality: N/A;   LAPAROSCOPIC INGUINAL HERNIA WITH UMBILICAL HERNIA Left 91/63/8466   Procedure: LAPAROSCOPIC LEFT INGUINAL HERNIA  AND UMBILICAL HERNIA REPAIRS WITH MESH;  Surgeon: Ralene Ok, MD;  Location: [facility];  Service: General;  Laterality: Left;   PROSTATE BIOPSY     x 2, once abnormal, once normal   TONSILLECTOMY      Family History  Problem Relation Age of Onset   Lung cancer Maternal Grandfather        enviromental    Heart attack Paternal Uncle    Prostate cancer Brother     Allergies  Allergen Reactions   Amoxicillin-Pot Clavulanate Diarrhea and Other (See Comments)    Night sweats, not allergic to Amoxicillin, just can't tolerate augmentin    Current Outpatient Medications on File Prior to Visit  Medication Sig Dispense Refill   finasteride (PROSCAR) 5 MG tablet Take 5 mg by mouth daily.     hydrochlorothiazide (HYDRODIURIL) 25 MG tablet Take 1 tablet by mouth once daily 90 tablet 1   losartan (COZAAR) 50 MG tablet Take 1 tablet by mouth once daily 90 tablet 0   Polyvinyl Alcohol-Povidone (REFRESH OP) Place 1 drop as needed into both eyes (for dry eyes).     No current facility-administered medications on file prior  to visit.    BP 132/84 (BP Location: Left Arm, Patient Position: Sitting, Cuff Size: Large)   Pulse 90   Temp 98.2 F (36.8 C) (Oral)   Wt 220 lb (99.8 kg)   SpO2 94%   BMI 33.95 kg/m       Objective:   Physical Exam Vitals and nursing note reviewed.  Constitutional:      Appearance: Normal appearance.  Cardiovascular:     Rate and Rhythm: Normal rate and regular rhythm.     Pulses: Normal pulses.     Heart sounds: Normal heart sounds.  Pulmonary:     Effort: Pulmonary effort is normal.     Breath sounds: Normal breath sounds.  Musculoskeletal:        General: Normal range of motion.  Skin:    General: Skin is warm and dry.     Capillary Refill: Capillary refill takes less than 2 seconds.  Neurological:     General: No focal deficit present.     Mental Status: He is alert and oriented to person, place, and time.  Psychiatric:        Mood and Affect: Mood normal.        Behavior: Behavior normal.        Thought Content: Thought content normal.       Assessment & Plan:  1. Adverse effect of vaccine, subsequent encounter - Seems to be inflammatory response to vaccination. Symptoms have resolved - Follow up PRN   2. Allergic rhinoconjunctivitis  - fluticasone (FLONASE) 50 MCG/ACT nasal spray; USE 1 SPRAY(S) IN EACH NOSTRIL TWICE DAILY AS NEEDED FOR RHINITIS  Dispense: 11.1 mL; Refill: 0  Dorothyann Peng, NP

## 2021-10-19 DIAGNOSIS — N138 Other obstructive and reflux uropathy: Secondary | ICD-10-CM | POA: Diagnosis not present

## 2021-10-19 DIAGNOSIS — N401 Enlarged prostate with lower urinary tract symptoms: Secondary | ICD-10-CM | POA: Diagnosis not present

## 2021-10-19 DIAGNOSIS — R3589 Other polyuria: Secondary | ICD-10-CM | POA: Diagnosis not present

## 2021-10-23 ENCOUNTER — Telehealth: Payer: Self-pay | Admitting: Adult Health

## 2021-10-23 NOTE — Telephone Encounter (Signed)
Pt said he had has flu shot on 09-21-2021 and he  does not see in his mychart

## 2021-10-24 NOTE — Telephone Encounter (Signed)
Chart updated

## 2021-12-05 ENCOUNTER — Ambulatory Visit: Payer: Medicare Other

## 2021-12-05 ENCOUNTER — Encounter: Payer: Self-pay | Admitting: Family Medicine

## 2021-12-05 ENCOUNTER — Telehealth (INDEPENDENT_AMBULATORY_CARE_PROVIDER_SITE_OTHER): Payer: Medicare Other | Admitting: Family Medicine

## 2021-12-05 VITALS — Ht 68.0 in

## 2021-12-05 DIAGNOSIS — Z0000 Encounter for general adult medical examination without abnormal findings: Secondary | ICD-10-CM | POA: Diagnosis not present

## 2021-12-05 NOTE — Patient Instructions (Addendum)
I really enjoyed getting to talk with you today! I am available on Tuesdays and Thursdays for virtual visits if you have any questions or concerns, or if I can be of any further assistance.   CHECKLIST FROM ANNUAL WELLNESS VISIT:  -Follow up (please call to schedule if not scheduled after visit):  -Inperson visit with your Primary Doctor office: in person physical yearly -yearly for annual wellness visit with primary care office  Here is a list of your preventive care/health maintenance measures and the plan for each if any are due:  Health Maintenance  Topic Date Due   Zoster Vaccines- Shingrix (1 of 2) When ready can do with pharmacy or your doctor   DTaP/Tdap/Td (2 - Tdap) When ready can do with pharmacy or your doctor   COVID-19 Vaccine (7 - 2023-24 season) 12/08/2021 (Originally 11/26/2021)   Medicare Annual Wellness (AWV)  12/06/2022   Pneumonia Vaccine 34+ Years old  Completed   INFLUENZA VACCINE  Completed   Hepatitis C Screening  Completed   HPV VACCINES  Aged Out   Fecal DNA (Cologuard)  Discontinued    -See a dentist at least yearly  -Get your eyes checked and then per your eye specialist's recommendations  -Other issues addressed today: -see balance exercises provided below  -I have included below further information regarding a healthy whole foods based diet, physical activity guidelines for adults, stress management and opportunities for social connections. I hope you find this information useful.     FOOD - THE FUEL FOR A HAPPY HEALTHY LIFE: -eat real food: lots of colorful vegetables (half the plate) and fruits -5-7 servings of vegetables and fruits per day (fresh or steamed is best), exp. 2 servings of vegetables with  lunch and dinner and 2 servings of fruit per day. Berries and greens such as kale and collards are great choices.  -consume on a regular basis: whole grains (make sure first ingredient on label contains the word "whole"), fresh fruits, fish, nuts, seeds, healthy oils (such as olive oil, avocado oil, grape seed oil) -may eat small amounts of dairy and lean meat on occasion, but avoid processed meats such as ham, bacon, lunch meat, etc. -drink water -try to avoid fast food and pre-packaged foods, processed meat -most experts advise limiting sodium to < '2300mg'$  per day, should limit further is any chronic conditions such as high blood pressure, heart disease, diabetes, etc. The American Heart Association advised that < '1500mg'$  is is ideal -try to avoid foods that contain any ingredients with names you do not recognize  -try to avoid sugar/sweets (except for the natural sugar that occurs in fresh fruit) -try to avoid sweet drinks -try to avoid white rice, white bread, pasta (unless whole grain), white or yellow potatoes  MOVE - the key to keeping your body moving and working best: -if you wish to increase your physical activity, do so gradually and with the approval of your doctor -STOP and seek medical care immediately if you have any chest pain, chest discomfort or trouble breathing when starting or increasing exercise  -move and stretch your body, legs, feet and arms when sitting for long periods -Physical activity guidelines for optimal health in adults: -least 150 minutes per week of aerobic exercise (can talk, but not sing) once approved by your doctor, 20-30 minutes of sustained activity or two 10 minute episodes of sustained activity every day.  -resistance training at least 2 days per week if approved by your doctor -balance exercises 3+ days  per week:   Stand somewhere where you have something sturdy to hold onto if you lose balance.    1) lift up on toes, start with 5x per day and work up to  20x   2) stand and lift on leg straight out to the side so that foot is a few inches of the floor, start with 5x each side and work up to 20x each side   3) stand on one foot, start with 5 seconds each side and work up to 20 seconds on each side  If you need ideas or help with getting more active:  -Silver sneakers https://tools.silversneakers.com  -Walk with a Doc: http://stephens-thompson.biz/  -try to include resistance (weight lifting/strength building) and balance exercises twice per week: or the following link for ideas: ChessContest.fr  UpdateClothing.com.cy  STRESS MANAGEMENT - so important for health and well being -try meditating, or just sitting quietly with deep breathing while intentionally relaxing all parts of your body for 5 minutes daily  SOCIAL CONNECTIONS: -options in [place] if you wish to engage in more social and exercise related activities:  -Silver sneakers https://tools.silversneakers.com  -Walk with a Doc: http://stephens-thompson.biz/  -Check out the [place] 50+ section on the [place] [employer] (hiking clubs, book clubs, cards and games, chess, exercise classes, aquatic classes and much more) - see the website for details: https://www.-[place].gov/departments/parks-recreation/active-adults50  -YouTube has lots of exercise videos for different ages and abilities as well  -[place] (a variety of indoor and outdoor inperson activities for adults). 657 867 8789. [address]  -Virtual Online Classes (a variety of topics): see seniorplanet.org or call 774 060 5137  -consider volunteering at a school, hospice center, church, senior center or elsewhere

## 2021-12-05 NOTE — Progress Notes (Signed)
PATIENT CHECK-IN and HEALTH RISK ASSESSMENT QUESTIONNAIRE:  -completed by phone/video for upcoming Medicare Preventive Visit  Pre-Visit Check-in: 1)Vitals (height, wt, BP, etc) - record in vitals section for visit on day of visit 2)Review and Update Medications, Allergies PMH, Surgeries, Social history in Epic 3)Hospitalizations in the last year with date/reason? He reports he had went to ED for bad reaction to covid vaccine- chills, heart rate above 120 beats at resting state.  4)Review and Update Care Team (patient's specialists) in Epic 5) Complete PHQ9 in Epic  6) Complete Fall Screening in Epic 7)Review all Health Maintenance Due and order under PCP if not done.  Medicare Wellness Patient Questionnaire:  Answer theses question about your habits: Do you drink alcohol? Yes If yes, how many drinks do you have a day? 10 ounces of drink a day.  Have you ever smoked?no Quit date if applicable? /n/a  How many packs a day do/did you smoke? N/a Do you use smokeless tobacco?n/a Do you use an illicit drugs?n/a Do you exercises? Yes IF so, what type and how many days/minutes per week?walking 30 mins a day.  Are you sexually active? No Number of partners?n/a Typical breakfast: Cereal, cranola bar, a glass of orange juice. Typical lunch: shrimp salad/ crab salad Typical dinner: fish, vegetable, salad  Typical snacks:cranola bars  Beverages: coke, water, orange juice, wine  Answer theses question about you: Can you perform most household chores?yes Do you find it hard to follow a conversation in a noisy room?no Do you often ask people to speak up or repeat themselves?no Do you feel that you have a problem with memory?no Do you balance your checkbook and or bank acounts?yes Do you feel safe at home?yes Last dentist visit? Patient reports he sees dentist every 6 months.  Do you need assistance with any of the following: Please note if so No  Driving?  Feeding yourself?  Getting from bed  to chair?  Getting to the toilet?  Bathing or showering?  Dressing yourself?  Managing money?  Climbing a flight of stairs  Preparing meals?    Do you have Advanced Directives in place (Living Will, Healthcare Power or Attorney)? In the process of getting it done.    Last eye Exam and location? At [facility], went earlier this year- 06/01/2021.   Do you currently use prescribed or non-prescribed narcotic or opioid pain medications?No  Do you have a history or close family history of breast, ovarian, tubal or peritoneal cancer or a family member with BRCA (breast cancer susceptibility 1 and 2) gene mutations? Paternal grandfather had lung cancer. Pt reports he got lung cancer from work environment.   Nurse/Assistant Credentials/time stamp: Karpuih M./CMA/11:25pm   ----------------------------------------------------------------------------------------------------------------------------------------------------------------------------------------------------------------------    MEDICARE ANNUAL PREVENTIVE CARE VISIT WITH PROVIDER (Welcome to Medicare, initial annual wellness or annual wellness exam)  Virtual Visit via Video Note  I connected with [place]  on 12/05/21 by a video enabled telemedicine application and verified that I am speaking with the correct person using two identifiers.  Location patient: home Location provider:work or home office Persons participating in the virtual visit: patient, provider  Concerns and/or follow up today: none   See HM section in Epic for other details of completed HM.    ROS: negative for report of fevers, unintentional weight loss, vision changes, vision loss, hearing loss or change, chest pain, sob, hemoptysis, melena, hematochezia, hematuria, genital discharge or lesions, falls, bleeding or bruising, loc, thoughts of suicide or self harm, memory loss  Patient-completed extensive  health risk assessment - reviewed and discussed with the  patient: See Health Risk Assessment completed with patient prior to the visit either above or in recent phone note. This was reviewed in detailed with the patient today and appropriate recommendations, orders and referrals were placed as needed per Summary below and patient instructions.   Review of Medical History: -PMH, PSH, Family History and current specialty and care providers reviewed and updated and listed below   Patient Care Team: Dorothyann Peng, NP as PCP - General (Family Medicine) Puschinsky, Fransico Him., MD (Urology)   Past Medical History:  Diagnosis Date   Arthritis    cervical spine - T7-8 & lumbar area - bone spurs up & down the back    Bulging discs     thoracic spine   Diverticulosis of colon    GERD (gastroesophageal reflux disease)    related to intestinal system, - has started Align  4-5 days ago-    Hyperlipidemia    Hypertension    Inguinal hernia    OSA (obstructive sleep apnea)    saw Dr. Annamaria Boots- 02/2015- pt. remarks that as a result BP med. was changed , pt. has a sleep number bed & he reports the score averages in the 80% range    PSA elevation    History of    Recurrent sinus infections    related to fracture nose x2, also reports that he has a deviated septum    Past Surgical History:  Procedure Laterality Date   COLONOSCOPY  2008   3 internal hemorrhoids, diverticulosis;Dr.Jacobs   INSERTION OF MESH N/A 11/13/2016   Procedure: INSERTION OF MESH;  Surgeon: Ralene Ok, MD;  Location: [place];  Service: General;  Laterality: N/A;   LAPAROSCOPIC INGUINAL HERNIA WITH UMBILICAL HERNIA Left 75/91/6384   Procedure: LAPAROSCOPIC LEFT INGUINAL HERNIA  AND UMBILICAL HERNIA REPAIRS WITH MESH;  Surgeon: Ralene Ok, MD;  Location: [place];  Service: General;  Laterality: Left;   PROSTATE BIOPSY     x 2, once abnormal, once normal   TONSILLECTOMY      Social History   Socioeconomic History   Marital status: Single    Spouse name: Not on file    Number of children: 0   Years of education: Not on file   Highest education level: Not on file  Occupational History   Occupation: retired  Tobacco Use   Smoking status: Never   Smokeless tobacco: Never  Vaping Use   Vaping Use: Never used  Substance and Sexual Activity   Alcohol use: Yes    Alcohol/week: 8.0 - 9.0 standard drinks of alcohol    Types: 8 - 9 Glasses of wine per week    Comment: social/ drinks wine(white)   Drug use: No   Sexual activity: Not on file  Other Topics Concern   Not on file  Social History Narrative   Retired    Single    No kids       He likes to read, volunteers with an animal rescue group.       Social Determinants of Health   Financial Resource Strain: Low Risk  (11/29/2020)   Overall Financial Resource Strain (CARDIA)    Difficulty of Paying Living Expenses: Not hard at all  Food Insecurity: No Food Insecurity (11/29/2020)   Hunger Vital Sign    Worried About Running Out of Food in the Last Year: Never true    Ran Out of Food in the Last Year: Never true  Transportation Needs: No Transportation Needs (11/29/2020)   PRAPARE - Hydrologist (Medical): No    Lack of Transportation (Non-Medical): No  Physical Activity: Sufficiently Active (11/29/2020)   Exercise Vital Sign    Days of Exercise per Week: 6 days    Minutes of Exercise per Session: 30 min  Stress: No Stress Concern Present (11/29/2020)   [place]    Feeling of Stress : Not at all  Social Connections: Socially Isolated (11/24/2019)   Social Connection and Isolation Panel [NHANES]    Frequency of Communication with Friends and Family: More than three times a week    Frequency of Social Gatherings with Friends and Family: Never    Attends Religious Services: Never    Marine scientist or Organizations: No    Attends Archivist Meetings: Never    Marital Status: Never  married  Intimate Partner Violence: Not At Risk (11/24/2019)   Humiliation, Afraid, Rape, and Kick questionnaire    Fear of Current or Ex-Partner: No    Emotionally Abused: No    Physically Abused: No    Sexually Abused: No    Family History  Problem Relation Age of Onset   Lung cancer Maternal Grandfather        enviromental    Heart attack Paternal Uncle    Prostate cancer Brother     Current Outpatient Medications on File Prior to Visit  Medication Sig Dispense Refill   finasteride (PROSCAR) 5 MG tablet Take 5 mg by mouth daily.     fluticasone (FLONASE) 50 MCG/ACT nasal spray USE 1 SPRAY(S) IN EACH NOSTRIL TWICE DAILY AS NEEDED FOR RHINITIS 11.1 mL 0   hydrochlorothiazide (HYDRODIURIL) 25 MG tablet Take 1 tablet by mouth once daily 90 tablet 1   losartan (COZAAR) 50 MG tablet Take 1 tablet by mouth once daily 90 tablet 0   Polyvinyl Alcohol-Povidone (REFRESH OP) Place 1 drop as needed into both eyes (for dry eyes).     No current facility-administered medications on file prior to visit.    Allergies  Allergen Reactions   Amoxicillin-Pot Clavulanate Diarrhea and Other (See Comments)    Night sweats, not allergic to Amoxicillin, just can't tolerate augmentin       Physical Exam Vitals:   12/05/21 1110  SpO2: 95%   Estimated body mass index is 33.45 kg/m as calculated from the following:   Height as of this encounter: _0  (1.727 m).   Weight as of 10/04/21: 220 lb (99.8 kg).  EKG (optional): deferred due to virtual visit  GENERAL: alert, oriented, no audible sounds of distress  Vision exam deferred due to virtual visit  PSYCH/NEURO: pleasant and cooperative, no obvious depression or anxiety, speech and thought processing grossly intact, Cognitive function grossly intact        12/05/2021   11:12 AM 11/29/2020   10:03 AM 11/24/2019    9:32 AM 11/11/2019   10:48 AM 07/22/2019    8:53 AM  Depression screen PHQ 2/9  Decreased Interest 0 0 0 0 0  Down,  Depressed, Hopeless 0 0 0 0 0  PHQ - 2 Score 0 0 0 0 0       11/24/2019    9:35 AM 11/29/2020    9:22 AM 11/29/2020   10:03 AM 10/02/2021    5:40 AM 12/05/2021   11:12 AM  [place] in the past year? 0 0 0  0  Was there an injury with Fall? 0 0   0  Fall Risk Category Calculator 0 0   0  Fall Risk Category Low Low   Low  Patient Fall Risk Level   Low fall risk Low fall risk Low fall risk  Patient at Risk for Falls Due to Impaired vision  Medication side effect  No Fall Risks  Fall risk Follow up Falls prevention discussed  Falls evaluation completed;Education provided;Falls prevention discussed  Falls evaluation completed     SUMMARY AND PLAN:  Encounter for Medicare annual wellness exam   Discussed applicable health maintenance/preventive health measures and advised and referred or ordered per patient preferences:  Health Maintenance  Topic Date Due   Zoster Vaccines- Shingrix (1 of 2) He plans to do, had side effects to recent covid vaccine and so has delayed   DTaP/Tdap/Td (2 - Tdap) "   COVID-19 Vaccine (7 - 2023-24 season) 12/08/2021 (Originally 11/26/2021)   Medicare Annual Wellness (AWV)  12/06/2022   Pneumonia Vaccine 4+ Years old  Completed   INFLUENZA VACCINE  Completed   Hepatitis C Screening  Completed   HPV VACCINES  Aged Out   Fecal DNA (Cologuard)  Discontinued    Education and counseling on the following was provided based on the above review of health and a plan/checklist for the patient, along with additional information discussed, was provided for the patient in the patient instructions :  -Advised on importance of and resources for completing advanced directives - working on theses currently with attorney -Advised and counseled on maintaining healthy weight and healthy lifestyle - including the importance of a health diet, regular physical activity, social connections and stress management. -Advised and counseled on a whole foods based healthy  diet and regular exercise: discussed a heart healthy whole foods based diet at length. A summary of a healthy diet was provided in the Patient Instructions. -Recommended regular exercise and discussed options within the community.  -Advised yearly dental visits at minimum and regular eye exams -Advised and counseled on alcohol - safe limits, risks, etc  Follow up: see patient instructions   Patient Instructions  I really enjoyed getting to talk with you today! I am available on Tuesdays and Thursdays for virtual visits if you have any questions or concerns, or if I can be of any further assistance.   CHECKLIST FROM ANNUAL WELLNESS VISIT:  -Follow up (please call to schedule if not scheduled after visit):  -Inperson visit with your Primary Doctor office: in person physical yearly -yearly for annual wellness visit with primary care office  Here is a list of your preventive care/health maintenance measures and the plan for each if any are due:  Health Maintenance  Topic Date Due   Zoster Vaccines- Shingrix (1 of 2) When ready can do with pharmacy or your doctor   DTaP/Tdap/Td (2 - Tdap) When ready can do with pharmacy or your doctor   COVID-19 Vaccine (7 - 2023-24 season) 12/08/2021 (Originally 11/26/2021)   Medicare Annual Wellness (AWV)  12/06/2022   Pneumonia Vaccine 44+ Years old  Completed   INFLUENZA VACCINE  Completed   Hepatitis C Screening  Completed   HPV VACCINES  Aged Out   Fecal DNA (Cologuard)  Discontinued    -See a dentist at least yearly  -Get your eyes checked and then per your eye specialist's recommendations  -Other issues addressed today: -see balance exercises provided below  -I have included below further information regarding a healthy whole foods  based diet, physical activity guidelines for adults, stress management and opportunities for social connections. I hope you find this information useful.    -----------------------------------------------------------------------------------------------------------------------------------------------------------------------------------------------------------------------------------------------------------  FOOD - THE FUEL FOR A HAPPY HEALTHY LIFE: -eat real food: lots of colorful vegetables (half the plate) and fruits -5-7 servings of vegetables and fruits per day (fresh or steamed is best), exp. 2 servings of vegetables with lunch and dinner and 2 servings of fruit per day. Berries and greens such as kale and collards are great choices.  -consume on a regular basis: whole grains (make sure first ingredient on label contains the word "whole"), fresh fruits, fish, nuts, seeds, healthy oils (such as olive oil, avocado oil, grape seed oil) -may eat small amounts of dairy and lean meat on occasion, but avoid processed meats such as ham, bacon, lunch meat, etc. -drink water -try to avoid fast food and pre-packaged foods, processed meat -most experts advise limiting sodium to < 2334m per day, should limit further is any chronic conditions such as high blood pressure, heart disease, diabetes, etc. The American Heart Association advised that < 15023mis is ideal -try to avoid foods that contain any ingredients with names you do not recognize  -try to avoid sugar/sweets (except for the natural sugar that occurs in fresh fruit) -try to avoid sweet drinks -try to avoid white rice, white bread, pasta (unless whole grain), white or yellow potatoes  MOVE - the key to keeping your body moving and working best: -if you wish to increase your physical activity, do so gradually and with the approval of your doctor -STOP and seek medical care immediately if you have any chest pain, chest discomfort or trouble breathing when starting or increasing exercise  -move and stretch your body, legs, feet and arms when sitting for long periods -Physical activity guidelines  for optimal health in adults: -least 150 minutes per week of aerobic exercise (can talk, but not sing) once approved by your doctor, 20-30 minutes of sustained activity or two 10 minute episodes of sustained activity every day.  -resistance training at least 2 days per week if approved by your doctor -balance exercises 3+ days per week:   Stand somewhere where you have something sturdy to hold onto if you lose balance.    1) lift up on toes, start with 5x per day and work up to 20x   2) stand and lift on leg straight out to the side so that foot is a few inches of the floor, start with 5x each side and work up to 20x each side   3) stand on one foot, start with 5 seconds each side and work up to 20 seconds on each side  If you need ideas or help with getting more active:  -Silver sneakers https://tools.silversneakers.com  -Walk with a Doc: Hthttp://stephens-thompson.biz/-try to include resistance (weight lifting/strength building) and balance exercises twice per week: or the following link for ideas: htChessContest.frhtUpdateClothing.com.cySTRESS MANAGEMENT - so important for health and well being -try meditating, or just sitting quietly with deep breathing while intentionally relaxing all parts of your body for 5 minutes daily  SOCIAL CONNECTIONS: -options in [place] you wish to engage in more social and exercise related activities:  -Silver sneakers https://tools.silversneakers.com  -Walk with a Doc: Hthttp://stephens-thompson.biz/-Check out the [place]0+ section on the CiVivianf GrHalliburton Companyhiking clubs, book clubs, cards and games, chess, exercise classes, aquatic classes and much more) - see the website  for details: https://www.[place]-.gov/departments/parks-recreation/active-adults50  -YouTube has lots of exercise videos for different ages and abilities as  well  -[place] (a variety of indoor and outdoor inperson activities for adults). 9028862621. [address]  -Virtual Online Classes (a variety of topics): see seniorplanet.org or call [phone]  -consider volunteering at a school, hospice center, church, senior center or elsewhere           Todd Kern, DO

## 2021-12-10 ENCOUNTER — Other Ambulatory Visit: Payer: Self-pay | Admitting: Adult Health

## 2021-12-10 DIAGNOSIS — I10 Essential (primary) hypertension: Secondary | ICD-10-CM

## 2021-12-13 ENCOUNTER — Encounter: Payer: Self-pay | Admitting: Adult Health

## 2021-12-13 ENCOUNTER — Ambulatory Visit (INDEPENDENT_AMBULATORY_CARE_PROVIDER_SITE_OTHER): Payer: Medicare Other | Admitting: Adult Health

## 2021-12-13 VITALS — BP 140/78 | HR 105 | Temp 99.8°F | Ht 68.0 in | Wt 222.0 lb

## 2021-12-13 DIAGNOSIS — R051 Acute cough: Secondary | ICD-10-CM

## 2021-12-13 LAB — POC INFLUENZA A&B (BINAX/QUICKVUE)
Influenza A, POC: NEGATIVE
Influenza B, POC: NEGATIVE

## 2021-12-13 LAB — POC COVID19 BINAXNOW: SARS Coronavirus 2 Ag: NEGATIVE

## 2021-12-13 MED ORDER — ALBUTEROL SULFATE HFA 108 (90 BASE) MCG/ACT IN AERS: 2.0000 | INHALATION_SPRAY | Freq: Four times a day (QID) | RESPIRATORY_TRACT | 0 refills | Status: DC | PRN

## 2021-12-13 MED ORDER — BENZONATATE 200 MG PO CAPS: 200.0000 mg | ORAL_CAPSULE | Freq: Two times a day (BID) | ORAL | 0 refills | Status: DC | PRN

## 2021-12-13 NOTE — Progress Notes (Signed)
Subjective:    Patient ID: KRYSTIAN YOUNGLOVE, male    DOB: 1940/04/06, 81 y.o.   MRN: [id number]  Cough   81 year old male who  has a past medical history of Arthritis, Bulging discs, Diverticulosis of colon, GERD (gastroesophageal reflux disease), Hyperlipidemia, Hypertension, Inguinal hernia, OSA (obstructive sleep apnea), PSA elevation, and Recurrent sinus infections.  He presents to the office today for an acute issue. His symptoms include that of productive cough, chest congestions, fatigue, and low grade fever ( up to 99.8).   He denies wheezing or shortness of breath.   He has been using Mucinex with some mild improvement in the amount of sputum he is coughing up.    Review of Systems  Respiratory:  Positive for cough.    See HPI   Past Medical History:  Diagnosis Date   Arthritis    cervical spine - T7-8 & lumbar area - bone spurs up & down the back    Bulging discs     thoracic spine   Diverticulosis of colon    GERD (gastroesophageal reflux disease)    related to intestinal system, - has started Align  4-5 days ago-    Hyperlipidemia    Hypertension    Inguinal hernia    OSA (obstructive sleep apnea)    saw Dr. Annamaria Boots- 02/2015- pt. remarks that as a result BP med. was changed , pt. has a sleep number bed & he reports the score averages in the 80% range    PSA elevation    History of    Recurrent sinus infections    related to fracture nose x2, also reports that he has a deviated septum    Social History   Socioeconomic History   Marital status: Single    Spouse name: Not on file   Number of children: 0   Years of education: Not on file   Highest education level: Not on file  Occupational History   Occupation: retired  Tobacco Use   Smoking status: Never   Smokeless tobacco: Never  Vaping Use   Vaping Use: Never used  Substance and Sexual Activity   Alcohol use: Yes    Alcohol/week: 8.0 - 9.0 standard drinks of alcohol    Types: 8 - 9 Glasses of wine per  week    Comment: social/ drinks wine(white)   Drug use: No   Sexual activity: Not on file  Other Topics Concern   Not on file  Social History Narrative   Retired    Single    No kids       He likes to read, volunteers with an animal rescue group.       Social Determinants of Health   Financial Resource Strain: Low Risk  (11/29/2020)   Overall Financial Resource Strain (CARDIA)    Difficulty of Paying Living Expenses: Not hard at all  Food Insecurity: No Food Insecurity (11/29/2020)   Hunger Vital Sign    Worried About Running Out of Food in the Last Year: Never true    Ran Out of Food in the Last Year: Never true  Transportation Needs: No Transportation Needs (11/29/2020)   PRAPARE - Hydrologist (Medical): No    Lack of Transportation (Non-Medical): No  Physical Activity: Sufficiently Active (11/29/2020)   Exercise Vital Sign    Days of Exercise per Week: 6 days    Minutes of Exercise per Session: 30 min  Stress: No Stress Concern Present (  11/29/2020)   [employer] [place]    Feeling of Stress : Not at all  Social Connections: Socially Isolated (11/24/2019)   Social Connection and Isolation Panel [NHANES]    Frequency of Communication with Friends and Family: More than three times a week    Frequency of Social Gatherings with Friends and Family: Never    Attends Religious Services: Never    Marine scientist or Organizations: No    Attends Archivist Meetings: Never    Marital Status: Never married  Intimate Partner Violence: Not At Risk (11/24/2019)   Humiliation, Afraid, Rape, and Kick questionnaire    Fear of Current or Ex-Partner: No    Emotionally Abused: No    Physically Abused: No    Sexually Abused: No    Past Surgical History:  Procedure Laterality Date   COLONOSCOPY  2008   3 internal hemorrhoids, diverticulosis;Dr.Jacobs   INSERTION OF MESH N/A  11/13/2016   Procedure: INSERTION OF MESH;  Surgeon: Ralene Ok, MD;  Location: [place];  Service: General;  Laterality: N/A;   LAPAROSCOPIC INGUINAL HERNIA WITH UMBILICAL HERNIA Left 95/63/8756   Procedure: LAPAROSCOPIC LEFT INGUINAL HERNIA  AND UMBILICAL HERNIA REPAIRS WITH MESH;  Surgeon: Ralene Ok, MD;  Location: [place];  Service: General;  Laterality: Left;   PROSTATE BIOPSY     x 2, once abnormal, once normal   TONSILLECTOMY      Family History  Problem Relation Age of Onset   Lung cancer Maternal Grandfather        enviromental    Heart attack Paternal Uncle    Prostate cancer Brother     Allergies  Allergen Reactions   Amoxicillin-Pot Clavulanate Diarrhea and Other (See Comments)    Night sweats, not allergic to Amoxicillin, just can't tolerate augmentin    Current Outpatient Medications on File Prior to Visit  Medication Sig Dispense Refill   finasteride (PROSCAR) 5 MG tablet Take 5 mg by mouth daily.     fluticasone (FLONASE) 50 MCG/ACT nasal spray USE 1 SPRAY(S) IN EACH NOSTRIL TWICE DAILY AS NEEDED FOR RHINITIS 11.1 mL 0   hydrochlorothiazide (HYDRODIURIL) 25 MG tablet Take 1 tablet by mouth once daily 90 tablet 1   losartan (COZAAR) 50 MG tablet Take 1 tablet by mouth once daily 90 tablet 0   Polyvinyl Alcohol-Povidone (REFRESH OP) Place 1 drop as needed into both eyes (for dry eyes).     No current facility-administered medications on file prior to visit.    BP (!) 140/78   Pulse (!) 105   Temp 99.8 F (37.7 C) (Oral)   Ht '5\' 8"'$  (1.727 m)   Wt 222 lb (100.7 kg)   SpO2 96%   BMI 33.75 kg/m       Objective:   Physical Exam Vitals and nursing note reviewed.  Constitutional:      Appearance: Normal appearance.  Cardiovascular:     Rate and Rhythm: Regular rhythm. Tachycardia present.     Pulses: Normal pulses.     Heart sounds: Normal heart sounds.  Pulmonary:     Effort: Pulmonary effort is normal.     Breath sounds: Normal breath sounds.   Musculoskeletal:        General: Normal range of motion.  Skin:    General: Skin is warm and dry.     Capillary Refill: Capillary refill takes less than 2 seconds.  Neurological:     General: No focal deficit  present.     Mental Status: He is alert and oriented to person, place, and time.  Psychiatric:        Mood and Affect: Mood normal.        Behavior: Behavior normal.        Thought Content: Thought content normal.        Judgment: Judgment normal.       Assessment & Plan:  1. Acute cough  - POC Influenza A&B(BINAX/QUICKVUE)- Negative - POC COVID-19- Negative  - Likely viral respiratory infection.  - Follow up if not better in the next 3-4 days  - benzonatate (TESSALON) 200 MG capsule; Take 1 capsule (200 mg total) by mouth 2 (two) times daily as needed for cough.  Dispense: 20 capsule; Refill: 0 - albuterol (VENTOLIN HFA) 108 (90 Base) MCG/ACT inhaler; Inhale 2 puffs into the lungs every 6 (six) hours as needed for wheezing or shortness of breath.  Dispense: 8 g; Refill: 0  Dorothyann Peng, NP

## 2021-12-14 ENCOUNTER — Encounter: Payer: Self-pay | Admitting: Adult Health

## 2021-12-15 NOTE — Telephone Encounter (Signed)
Can you please advise if pt can take an OTC medication like coricidin due to pt health? Please advise in the absence of Cory.

## 2021-12-18 ENCOUNTER — Ambulatory Visit (INDEPENDENT_AMBULATORY_CARE_PROVIDER_SITE_OTHER): Payer: Medicare Other

## 2021-12-18 ENCOUNTER — Ambulatory Visit (INDEPENDENT_AMBULATORY_CARE_PROVIDER_SITE_OTHER): Payer: Medicare Other | Admitting: Family Medicine

## 2021-12-18 ENCOUNTER — Telehealth: Payer: Self-pay | Admitting: Adult Health

## 2021-12-18 ENCOUNTER — Encounter: Payer: Self-pay | Admitting: Family Medicine

## 2021-12-18 VITALS — BP 140/64 | HR 95 | Temp 98.4°F | Ht 68.0 in | Wt 221.6 lb

## 2021-12-18 DIAGNOSIS — R059 Cough, unspecified: Secondary | ICD-10-CM | POA: Diagnosis not present

## 2021-12-18 DIAGNOSIS — R918 Other nonspecific abnormal finding of lung field: Secondary | ICD-10-CM | POA: Diagnosis not present

## 2021-12-18 MED ORDER — AMOXICILLIN 500 MG PO CAPS: 500.0000 mg | ORAL_CAPSULE | Freq: Three times a day (TID) | ORAL | 0 refills | Status: DC

## 2021-12-18 MED ORDER — AZITHROMYCIN 250 MG PO TABS: ORAL_TABLET | ORAL | 0 refills | Status: AC

## 2021-12-18 NOTE — Progress Notes (Signed)
Established Patient Office Visit  Subjective   Patient ID: Todd Larson, male    DOB: 10/21/1940  Age: 81 y.o. MRN: [id number]  Chief Complaint  Patient presents with   Sinusitis    Tried Mucinex, Benzonatate, x1 week    HPI   Todd Larson is seen with persistent cold-like symptoms.  He was seen last week and felt to have viral process.  Has been taking over-the-counter Mucinex.  Onset of symptoms was last week.  He has some cough now productive of thick mucus.  No fever.  No chills.  Tessalon Perles with no relief.  Denies any dyspnea.  No pleuritic pain.  No sore throat.  No facial pain.  No purulent secretions.  No dyspnea at rest.  Denies any nausea or vomiting.  His chronic problems include history of hypertension, obstructive sleep apnea, hyperlipidemia  Past Medical History:  Diagnosis Date   Arthritis    cervical spine - T7-8 & lumbar area - bone spurs up & down the back    Bulging discs     thoracic spine   Diverticulosis of colon    GERD (gastroesophageal reflux disease)    related to intestinal system, - has started Align  4-5 days ago-    Hyperlipidemia    Hypertension    Inguinal hernia    OSA (obstructive sleep apnea)    saw Dr. Annamaria Boots- 02/2015- pt. remarks that as a result BP med. was changed , pt. has a sleep number bed & he reports the score averages in the 80% range    PSA elevation    History of    Recurrent sinus infections    related to fracture nose x2, also reports that he has a deviated septum   Past Surgical History:  Procedure Laterality Date   COLONOSCOPY  2008   3 internal hemorrhoids, diverticulosis;Dr.Jacobs   INSERTION OF MESH N/A 11/13/2016   Procedure: INSERTION OF MESH;  Surgeon: Ralene Ok, MD;  Location: [facility];  Service: General;  Laterality: N/A;   LAPAROSCOPIC INGUINAL HERNIA WITH UMBILICAL HERNIA Left 78/29/5621   Procedure: LAPAROSCOPIC LEFT INGUINAL HERNIA  AND UMBILICAL HERNIA REPAIRS WITH MESH;  Surgeon: Ralene Ok, MD;   Location: [facility];  Service: General;  Laterality: Left;   PROSTATE BIOPSY     x 2, once abnormal, once normal   TONSILLECTOMY      reports that he has never smoked. He has never used smokeless tobacco. He reports current alcohol use of about 8.0 - 9.0 standard drinks of alcohol per week. He reports that he does not use drugs. family history includes Heart attack in his paternal uncle; Lung cancer in his maternal grandfather; Prostate cancer in his brother. Allergies  Allergen Reactions   Amoxicillin-Pot Clavulanate Diarrhea and Other (See Comments)    Night sweats, not allergic to Amoxicillin, just can't tolerate augmentin    Review of Systems  Constitutional:  Negative for chills and fever.  HENT:  Positive for congestion. Negative for sore throat.   Respiratory:  Positive for cough.       Objective:     BP (!) 140/64 (BP Location: Left Arm, Patient Position: Sitting, Cuff Size: Normal)   Pulse 95   Temp 98.4 F (36.9 C) (Oral)   Ht '5\' 8"'$  (1.727 m)   Wt 221 lb 9.6 oz (100.5 kg)   SpO2 97%   BMI 33.69 kg/m  BP Readings from Last 3 Encounters:  12/18/21 (!) 140/64  12/13/21 (!) 140/78  10/04/21 132/84   Wt Readings from Last 3 Encounters:  12/18/21 221 lb 9.6 oz (100.5 kg)  12/13/21 222 lb (100.7 kg)  10/04/21 220 lb (99.8 kg)      Physical Exam Vitals reviewed.  Constitutional:      Appearance: Normal appearance.  HENT:     Ears:     Comments: Eardrums are not visualized secondary to cerumen bilaterally. Cardiovascular:     Rate and Rhythm: Normal rate and regular rhythm.  Pulmonary:     Comments: Lungs are fairly clear but he does have some higher pitched breath sounds with inspiration on the right lung base compared to the left.  No rales.  No expiratory wheezes. Neurological:     Mental Status: He is alert.      No results found for any visits on 12/18/21.    The 10-year ASCVD risk score (Arnett DK, et al., 2019) is: 38.2%    Assessment & Plan:    Problem List Items Addressed This Visit   None Visit Diagnoses     Cough, unspecified type    -  Primary   Relevant Orders   DG Chest 2 View     Progressive productive cough.  Does not have any red flags such as current fever, hypoxemia, etc. -Given his age we elected to go and cover with amoxicillin 500 mg 3 times daily for 7 days and Zithromax for 5 days.  No follow-ups on file.    Carolann Littler, MD

## 2021-12-18 NOTE — Telephone Encounter (Signed)
amoxicillin (AMOXIL) 500 MG capsule  says take 1 tablet 3 times a day for 10 days but only 21 capsules were written

## 2021-12-18 NOTE — Patient Instructions (Signed)
Start the antibiotics  Follow up for increased shortness of breath or O 2 sats < 90.

## 2021-12-19 ENCOUNTER — Telehealth: Payer: Self-pay | Admitting: Adult Health

## 2021-12-19 ENCOUNTER — Other Ambulatory Visit: Payer: Self-pay | Admitting: Family Medicine

## 2021-12-19 NOTE — Telephone Encounter (Signed)
[facility] ([facility]) is requesting a call back as soon as possible.  Medication wrote yesterday by Dr. Elease Hashimoto states 3 x daily for 10 days, however the quantity on the prescription is 21.  Numbers don't match so pharmacy needs clarification.

## 2021-12-19 NOTE — Telephone Encounter (Signed)
Pt was seen by Dr. Elease Hashimoto yesterday and called to say he should have gotten 30 pills of  amoxicillin (AMOXIL) 500 MG capsule, not 21 pills.  Please advise.

## 2021-12-19 NOTE — Telephone Encounter (Signed)
Monique with [facility] given clarification on prescription.

## 2021-12-20 NOTE — Telephone Encounter (Signed)
This was taken care of

## 2021-12-20 NOTE — Telephone Encounter (Signed)
Left a detailed message with the information below on the voicemail at [facility].

## 2021-12-21 ENCOUNTER — Ambulatory Visit (INDEPENDENT_AMBULATORY_CARE_PROVIDER_SITE_OTHER): Payer: Medicare Other | Admitting: Adult Health

## 2021-12-21 ENCOUNTER — Encounter: Payer: Self-pay | Admitting: Adult Health

## 2021-12-21 VITALS — BP 140/60 | HR 105 | Temp 98.1°F | Ht 68.0 in | Wt 222.0 lb

## 2021-12-21 DIAGNOSIS — R059 Cough, unspecified: Secondary | ICD-10-CM | POA: Diagnosis not present

## 2021-12-21 DIAGNOSIS — R040 Epistaxis: Secondary | ICD-10-CM | POA: Diagnosis not present

## 2021-12-21 NOTE — Progress Notes (Signed)
Subjective:    Patient ID: Todd Larson, male    DOB: [birth year], 81 y.o.   MRN: [id number]  HPI 81 year old male who  has a past medical history of Arthritis, Bulging discs, Diverticulosis of colon, GERD (gastroesophageal reflux disease), Hyperlipidemia, Hypertension, Inguinal hernia, OSA (obstructive sleep apnea), PSA elevation, and Recurrent sinus infections.  He presents to the office today for follow-up regarding cough.  He was originally seen on 12/13/2021 and at this time it was felt to be viral process.  Tessalon Perles were not giving relief the other was over-the-counter Mucinex.  He was seen for follow-up 3 days ago as the cough has become productive of thick mucus.  He was not having any fevers or chills.  His age it was decided to cover him with antibiotic therapy with Amoxicillin and Zithromax.   Chest x-ray showed mild patchy opacity of the medial left lung base developing pneumonia is not excluded.  There was a mix up with the antibiotic count of the amoxicillin he started this with the azithromycin about a day and a half ago.  He is starting to feel little bit better but continues to have a productive cough with discolored mucus.  At home he has also been using Mucinex which seems to help out quite well but he reports that the Mucinex dries him out too much and he starts to have nosebleeds   Review of Systems See HPI   Past Medical History:  Diagnosis Date   Arthritis    cervical spine - T7-8 & lumbar area - bone spurs up & down the back    Bulging discs     thoracic spine   Diverticulosis of colon    GERD (gastroesophageal reflux disease)    related to intestinal system, - has started Align  4-5 days ago-    Hyperlipidemia    Hypertension    Inguinal hernia    OSA (obstructive sleep apnea)    saw Dr. Annamaria Boots- 02/2015- pt. remarks that as a result BP med. was changed , pt. has a sleep number bed & he reports the score averages in the 80% range    PSA elevation     History of    Recurrent sinus infections    related to fracture nose x2, also reports that he has a deviated septum    Social History   Socioeconomic History   Marital status: Single    Spouse name: Not on file   Number of children: 0   Years of education: Not on file   Highest education level: Not on file  Occupational History   Occupation: retired  Tobacco Use   Smoking status: Never   Smokeless tobacco: Never  Vaping Use   Vaping Use: Never used  Substance and Sexual Activity   Alcohol use: Yes    Alcohol/week: 8.0 - 9.0 standard drinks of alcohol    Types: 8 - 9 Glasses of wine per week    Comment: social/ drinks wine(white)   Drug use: No   Sexual activity: Not on file  Other Topics Concern   Not on file  Social History Narrative   Retired    Single    No kids       He likes to read, volunteers with an animal rescue group.       Social Determinants of Health   Financial Resource Strain: Low Risk  (11/29/2020)   Overall Financial Resource Strain (CARDIA)    Difficulty of Paying  Living Expenses: Not hard at all  Food Insecurity: No Food Insecurity (11/29/2020)   Hunger Vital Sign    Worried About Running Out of Food in the Last Year: Never true    Ran Out of Food in the Last Year: Never true  Transportation Needs: No Transportation Needs (11/29/2020)   PRAPARE - Hydrologist (Medical): No    Lack of Transportation (Non-Medical): No  Physical Activity: Sufficiently Active (11/29/2020)   Exercise Vital Sign    Days of Exercise per Week: 6 days    Minutes of Exercise per Session: 30 min  Stress: No Stress Concern Present (11/29/2020)   [place]    Feeling of Stress : Not at all  Social Connections: Socially Isolated (11/24/2019)   Social Connection and Isolation Panel [NHANES]    Frequency of Communication with Friends and Family: More than three times a week     Frequency of Social Gatherings with Friends and Family: Never    Attends Religious Services: Never    Marine scientist or Organizations: No    Attends Archivist Meetings: Never    Marital Status: Never married  Intimate Partner Violence: Not At Risk (11/24/2019)   Humiliation, Afraid, Rape, and Kick questionnaire    Fear of Current or Ex-Partner: No    Emotionally Abused: No    Physically Abused: No    Sexually Abused: No    Past Surgical History:  Procedure Laterality Date   COLONOSCOPY  2008   3 internal hemorrhoids, diverticulosis;Dr.Jacobs   INSERTION OF MESH N/A 11/13/2016   Procedure: INSERTION OF MESH;  Surgeon: Ralene Ok, MD;  Location: [facility];  Service: General;  Laterality: N/A;   LAPAROSCOPIC INGUINAL HERNIA WITH UMBILICAL HERNIA Left 16/07/3708   Procedure: LAPAROSCOPIC LEFT INGUINAL HERNIA  AND UMBILICAL HERNIA REPAIRS WITH MESH;  Surgeon: Ralene Ok, MD;  Location: [facility];  Service: General;  Laterality: Left;   PROSTATE BIOPSY     x 2, once abnormal, once normal   TONSILLECTOMY      Family History  Problem Relation Age of Onset   Lung cancer Maternal Grandfather        enviromental    Heart attack Paternal Uncle    Prostate cancer Brother     Allergies  Allergen Reactions   Amoxicillin-Pot Clavulanate Diarrhea and Other (See Comments)    Night sweats, not allergic to Amoxicillin, just can't tolerate augmentin    Current Outpatient Medications on File Prior to Visit  Medication Sig Dispense Refill   albuterol (VENTOLIN HFA) 108 (90 Base) MCG/ACT inhaler Inhale 2 puffs into the lungs every 6 (six) hours as needed for wheezing or shortness of breath. 8 g 0   amoxicillin (AMOXIL) 500 MG capsule Take 1 capsule (500 mg total) by mouth 3 (three) times daily for 10 days. 30 capsule 0   azithromycin (ZITHROMAX Z-PAK) 250 MG tablet Take 2 tablets (500 mg) on  Day 1,  followed by 1 tablet (250 mg) once daily on Days 2 through 5. 6 each 0    benzonatate (TESSALON) 200 MG capsule Take 1 capsule (200 mg total) by mouth 2 (two) times daily as needed for cough. 20 capsule 0   finasteride (PROSCAR) 5 MG tablet Take 5 mg by mouth daily.     fluticasone (FLONASE) 50 MCG/ACT nasal spray USE 1 SPRAY(S) IN EACH NOSTRIL TWICE DAILY AS NEEDED FOR RHINITIS 11.1 mL 0  hydrochlorothiazide (HYDRODIURIL) 25 MG tablet Take 1 tablet by mouth once daily 90 tablet 1   losartan (COZAAR) 50 MG tablet Take 1 tablet by mouth once daily 90 tablet 0   Polyvinyl Alcohol-Povidone (REFRESH OP) Place 1 drop as needed into both eyes (for dry eyes).     No current facility-administered medications on file prior to visit.    There were no vitals taken for this visit.      Objective:   Physical Exam Vitals and nursing note reviewed.  Constitutional:      Appearance: Normal appearance.  HENT:     Nose:     Right Nostril: Epistaxis present.     Left Nostril: Epistaxis present.     Comments: Dried blood noted in bilateral nares Cardiovascular:     Rate and Rhythm: Normal rate and regular rhythm.     Pulses: Normal pulses.     Heart sounds: Normal heart sounds.  Pulmonary:     Effort: Pulmonary effort is normal.     Breath sounds: Rhonchi (mild LL) present.  Skin:    General: Skin is warm and dry.  Neurological:     General: No focal deficit present.     Mental Status: He is alert and oriented to person, place, and time.  Psychiatric:        Mood and Affect: Mood normal.        Behavior: Behavior normal.        Thought Content: Thought content normal.        Judgment: Judgment normal.       Assessment & Plan:   1. Cough, unspecified type - likely CAP - Continue and finish ABX  2. Nosebleed - Advised OTC NS nasal spray multiple times a day   Dorothyann Peng, NP

## 2022-01-25 ENCOUNTER — Ambulatory Visit (INDEPENDENT_AMBULATORY_CARE_PROVIDER_SITE_OTHER): Payer: Medicare Other | Admitting: Adult Health

## 2022-01-25 ENCOUNTER — Encounter: Payer: Self-pay | Admitting: Adult Health

## 2022-01-25 VITALS — BP 128/70 | HR 94 | Temp 98.7°F | Ht 68.0 in | Wt 218.0 lb

## 2022-01-25 DIAGNOSIS — M79661 Pain in right lower leg: Secondary | ICD-10-CM

## 2022-01-25 DIAGNOSIS — B49 Unspecified mycosis: Secondary | ICD-10-CM

## 2022-01-25 DIAGNOSIS — M79662 Pain in left lower leg: Secondary | ICD-10-CM

## 2022-01-25 DIAGNOSIS — E559 Vitamin D deficiency, unspecified: Secondary | ICD-10-CM | POA: Diagnosis not present

## 2022-01-25 LAB — VITAMIN B12: Vitamin B-12: 427 pg/mL (ref 211–911)

## 2022-01-25 LAB — VITAMIN D 25 HYDROXY (VIT D DEFICIENCY, FRACTURES): VITD: 26.7 ng/mL — ABNORMAL LOW (ref 30.00–100.00)

## 2022-01-25 MED ORDER — CLOTRIMAZOLE-BETAMETHASONE 1-0.05 % EX CREA: 1.0000 | TOPICAL_CREAM | Freq: Two times a day (BID) | CUTANEOUS | 0 refills | Status: DC

## 2022-01-25 NOTE — Progress Notes (Signed)
Subjective:    Patient ID: Todd Larson, male    DOB: Jan 16, 1940, 82 y.o.   MRN: [id number]  HPI 82 year old male who  has a past medical history of Arthritis, Bulging discs, Diverticulosis of colon, GERD (gastroesophageal reflux disease), Hyperlipidemia, Hypertension, Inguinal hernia, OSA (obstructive sleep apnea), PSA elevation, and Recurrent sinus infections.  He presents to the office today for an acute issue of a rash on his left lower extremity behind his knee.  This area is red and raised is not associated with pain or itching.  Additionally, he continues to have bilateral pain in both lower extremities mostly in both Calves.  He has been seen by vein and vascular and his exam was negative for claudication or DVT.  He continues to stretch at night and takes a multivitamin.    Review of Systems See HPI   Past Medical History:  Diagnosis Date   Arthritis    cervical spine - T7-8 & lumbar area - bone spurs up & down the back    Bulging discs     thoracic spine   Diverticulosis of colon    GERD (gastroesophageal reflux disease)    related to intestinal system, - has started Align  4-5 days ago-    Hyperlipidemia    Hypertension    Inguinal hernia    OSA (obstructive sleep apnea)    saw Dr. Annamaria Boots- 02/2015- pt. remarks that as a result BP med. was changed , pt. has a sleep number bed & he reports the score averages in the 80% range    PSA elevation    History of    Recurrent sinus infections    related to fracture nose x2, also reports that he has a deviated septum    Social History   Socioeconomic History   Marital status: Single    Spouse name: Not on file   Number of children: 0   Years of education: Not on file   Highest education level: Not on file  Occupational History   Occupation: retired  Tobacco Use   Smoking status: Never   Smokeless tobacco: Never  Vaping Use   Vaping Use: Never used  Substance and Sexual Activity   Alcohol use: Yes    Alcohol/week:  8.0 - 9.0 standard drinks of alcohol    Types: 8 - 9 Glasses of wine per week    Comment: social/ drinks wine(white)   Drug use: No   Sexual activity: Not on file  Other Topics Concern   Not on file  Social History Narrative   Retired    Single    No kids       He likes to read, volunteers with an animal rescue group.       Social Determinants of Health   Financial Resource Strain: Low Risk  (11/29/2020)   Overall Financial Resource Strain (CARDIA)    Difficulty of Paying Living Expenses: Not hard at all  Food Insecurity: No Food Insecurity (11/29/2020)   Hunger Vital Sign    Worried About Running Out of Food in the Last Year: Never true    Ran Out of Food in the Last Year: Never true  Transportation Needs: No Transportation Needs (11/29/2020)   PRAPARE - Hydrologist (Medical): No    Lack of Transportation (Non-Medical): No  Physical Activity: Sufficiently Active (11/29/2020)   Exercise Vital Sign    Days of Exercise per Week: 6 days    Minutes  of Exercise per Session: 30 min  Stress: No Stress Concern Present (11/29/2020)   [place]    Feeling of Stress : Not at all  Social Connections: Socially Isolated (11/24/2019)   Social Connection and Isolation Panel [NHANES]    Frequency of Communication with Friends and Family: More than three times a week    Frequency of Social Gatherings with Friends and Family: Never    Attends Religious Services: Never    Marine scientist or Organizations: No    Attends Archivist Meetings: Never    Marital Status: Never married  Intimate Partner Violence: Not At Risk (11/24/2019)   Humiliation, Afraid, Rape, and Kick questionnaire    Fear of Current or Ex-Partner: No    Emotionally Abused: No    Physically Abused: No    Sexually Abused: No    Past Surgical History:  Procedure Laterality Date   COLONOSCOPY  2008   3  internal hemorrhoids, diverticulosis;Dr.Jacobs   INSERTION OF MESH N/A 11/13/2016   Procedure: INSERTION OF MESH;  Surgeon: Ralene Ok, MD;  Location: [facility];  Service: General;  Laterality: N/A;   LAPAROSCOPIC INGUINAL HERNIA WITH UMBILICAL HERNIA Left 16/10/9602   Procedure: LAPAROSCOPIC LEFT INGUINAL HERNIA  AND UMBILICAL HERNIA REPAIRS WITH MESH;  Surgeon: Ralene Ok, MD;  Location: [facility];  Service: General;  Laterality: Left;   PROSTATE BIOPSY     x 2, once abnormal, once normal   TONSILLECTOMY      Family History  Problem Relation Age of Onset   Lung cancer Maternal Grandfather        enviromental    Heart attack Paternal Uncle    Prostate cancer Brother     Allergies  Allergen Reactions   Amoxicillin-Pot Clavulanate Diarrhea and Other (See Comments)    Night sweats, not allergic to Amoxicillin, just can't tolerate augmentin    Current Outpatient Medications on File Prior to Visit  Medication Sig Dispense Refill   finasteride (PROSCAR) 5 MG tablet Take 5 mg by mouth daily.     fluticasone (FLONASE) 50 MCG/ACT nasal spray USE 1 SPRAY(S) IN EACH NOSTRIL TWICE DAILY AS NEEDED FOR RHINITIS 11.1 mL 0   hydrochlorothiazide (HYDRODIURIL) 25 MG tablet Take 1 tablet by mouth once daily 90 tablet 1   losartan (COZAAR) 50 MG tablet Take 1 tablet by mouth once daily 90 tablet 0   Polyvinyl Alcohol-Povidone (REFRESH OP) Place 1 drop as needed into both eyes (for dry eyes).     No current facility-administered medications on file prior to visit.    BP 128/70   Pulse 94   Temp 98.7 F (37.1 C) (Oral)   Ht '5\' 8"'$  (1.727 m)   Wt 218 lb (98.9 kg)   SpO2 96%   BMI 33.15 kg/m       Objective:   Physical Exam Vitals and nursing note reviewed.  Constitutional:      Appearance: Normal appearance.  Skin:    General: Skin is dry.     Findings: Rash present.          Comments: Rash with raised border located to medial aspect of left lower extremity on the side of his  knee.    Neurological:     General: No focal deficit present.     Mental Status: He is alert and oriented to person, place, and time.  Psychiatric:        Mood and Affect: Mood normal.  Behavior: Behavior normal.        Thought Content: Thought content normal.        Judgment: Judgment normal.       Assessment & Plan:  1. Fungal infection - appears fungal. He does have an indoor/outdoor cat - clotrimazole-betamethasone (LOTRISONE) cream; Apply 1 Application topically 2 (two) times daily.  Dispense: 45 g; Refill: 0 - Vitamin B12; Future - Vitamin B12  2. Bilateral calf pain - will check for b12 and vitamin D deficiency  - Vitamin B12; Future - Vitamin B12  3. Vitamin D deficiency  - VITAMIN D 25 Hydroxy (Vit-D Deficiency, Fractures); Future - VITAMIN D 25 Hydroxy (Vit-D Deficiency, Fractures)  Dorothyann Peng, NP

## 2022-01-30 ENCOUNTER — Telehealth: Payer: Self-pay | Admitting: Adult Health

## 2022-01-30 NOTE — Telephone Encounter (Signed)
Pt is returning [place] call concerning blood work result

## 2022-01-31 NOTE — Telephone Encounter (Signed)
Patient notified of update  and verbalized understanding. 

## 2022-02-01 ENCOUNTER — Telehealth: Payer: Self-pay | Admitting: Adult Health

## 2022-02-01 NOTE — Telephone Encounter (Signed)
Please advise 

## 2022-02-01 NOTE — Telephone Encounter (Signed)
Patient taking clotrimazole-betamethasone (LOTRISONE) cream  and read on the medication pamphlet it states to seek advice if symptoms have not improved in a week and he feels it has not improved enough. Please advise

## 2022-02-01 NOTE — Telephone Encounter (Signed)
Patient notified of update  and verbalized understanding. 

## 2022-02-23 ENCOUNTER — Ambulatory Visit (INDEPENDENT_AMBULATORY_CARE_PROVIDER_SITE_OTHER): Payer: Medicare Other | Admitting: Adult Health

## 2022-02-23 ENCOUNTER — Encounter: Payer: Self-pay | Admitting: Adult Health

## 2022-02-23 VITALS — BP 120/60 | HR 87 | Temp 98.5°F | Ht 68.0 in | Wt 224.0 lb

## 2022-02-23 DIAGNOSIS — L853 Xerosis cutis: Secondary | ICD-10-CM | POA: Diagnosis not present

## 2022-02-23 NOTE — Progress Notes (Signed)
Subjective:    Patient ID: Todd Larson, male    DOB: [birth year], 82 y.o.   MRN: [id number]  HPI 82 year old male who  has a past medical history of Arthritis, Bulging discs, Diverticulosis of colon, GERD (gastroesophageal reflux disease), Hyperlipidemia, Hypertension, Inguinal hernia, OSA (obstructive sleep apnea), PSA elevation, and Recurrent sinus infections.  He presents to the office today for concern of rash. He was last seen about a month ago for an acute rash on his left lower extremity , behind his knee. The area was raised and red. It was thought to be fungal in origin and he was prescribed Lotrisone cream. This rash has cleared up but he now has developed a raised red rash on the insides of both legs. This rash does not itch or burn   He has been using suave and dial body wash   Review of Systems See HPI   Past Medical History:  Diagnosis Date   Arthritis    cervical spine - T7-8 & lumbar area - bone spurs up & down the back    Bulging discs     thoracic spine   Diverticulosis of colon    GERD (gastroesophageal reflux disease)    related to intestinal system, - has started Align  4-5 days ago-    Hyperlipidemia    Hypertension    Inguinal hernia    OSA (obstructive sleep apnea)    saw Dr. Annamaria Boots- 02/2015- pt. remarks that as a result BP med. was changed , pt. has a sleep number bed & he reports the score averages in the 80% range    PSA elevation    History of    Recurrent sinus infections    related to fracture nose x2, also reports that he has a deviated septum    Social History   Socioeconomic History   Marital status: Single    Spouse name: Not on file   Number of children: 0   Years of education: Not on file   Highest education level: Not on file  Occupational History   Occupation: retired  Tobacco Use   Smoking status: Never   Smokeless tobacco: Never  Vaping Use   Vaping Use: Never used  Substance and Sexual Activity   Alcohol use: Yes     Alcohol/week: 8.0 - 9.0 standard drinks of alcohol    Types: 8 - 9 Glasses of wine per week    Comment: social/ drinks wine(white)   Drug use: No   Sexual activity: Not on file  Other Topics Concern   Not on file  Social History Narrative   Retired    Single    No kids       He likes to read, volunteers with an animal rescue group.       Social Determinants of Health   Financial Resource Strain: Low Risk  (11/29/2020)   Overall Financial Resource Strain (CARDIA)    Difficulty of Paying Living Expenses: Not hard at all  Food Insecurity: No Food Insecurity (11/29/2020)   Hunger Vital Sign    Worried About Running Out of Food in the Last Year: Never true    Ran Out of Food in the Last Year: Never true  Transportation Needs: No Transportation Needs (11/29/2020)   PRAPARE - Hydrologist (Medical): No    Lack of Transportation (Non-Medical): No  Physical Activity: Sufficiently Active (11/29/2020)   Exercise Vital Sign    Days of Exercise  per Week: 6 days    Minutes of Exercise per Session: 30 min  Stress: No Stress Concern Present (11/29/2020)   [place]    Feeling of Stress : Not at all  Social Connections: Socially Isolated (11/24/2019)   Social Connection and Isolation Panel [NHANES]    Frequency of Communication with Friends and Family: More than three times a week    Frequency of Social Gatherings with Friends and Family: Never    Attends Religious Services: Never    Marine scientist or Organizations: No    Attends Archivist Meetings: Never    Marital Status: Never married  Intimate Partner Violence: Not At Risk (11/24/2019)   Humiliation, Afraid, Rape, and Kick questionnaire    Fear of Current or Ex-Partner: No    Emotionally Abused: No    Physically Abused: No    Sexually Abused: No    Past Surgical History:  Procedure Laterality Date   COLONOSCOPY   2008   3 internal hemorrhoids, diverticulosis;Dr.Jacobs   INSERTION OF MESH N/A 11/13/2016   Procedure: INSERTION OF MESH;  Surgeon: Ralene Ok, MD;  Location: [place];  Service: General;  Laterality: N/A;   LAPAROSCOPIC INGUINAL HERNIA WITH UMBILICAL HERNIA Left [id number]   Procedure: LAPAROSCOPIC LEFT INGUINAL HERNIA  AND UMBILICAL HERNIA REPAIRS WITH MESH;  Surgeon: Ralene Ok, MD;  Location: [place];  Service: General;  Laterality: Left;   PROSTATE BIOPSY     x 2, once abnormal, once normal   TONSILLECTOMY      Family History  Problem Relation Age of Onset   Lung cancer Maternal Grandfather        enviromental    Heart attack Paternal Uncle    Prostate cancer Brother     Allergies  Allergen Reactions   Amoxicillin-Pot Clavulanate Diarrhea and Other (See Comments)    Night sweats, not allergic to Amoxicillin, just can't tolerate augmentin    Current Outpatient Medications on File Prior to Visit  Medication Sig Dispense Refill   clotrimazole-betamethasone (LOTRISONE) cream Apply 1 Application topically 2 (two) times daily. 45 g 0   finasteride (PROSCAR) 5 MG tablet Take 5 mg by mouth daily.     fluticasone (FLONASE) 50 MCG/ACT nasal spray USE 1 SPRAY(S) IN EACH NOSTRIL TWICE DAILY AS NEEDED FOR RHINITIS 11.1 mL 0   hydrochlorothiazide (HYDRODIURIL) 25 MG tablet Take 1 tablet by mouth once daily 90 tablet 1   losartan (COZAAR) 50 MG tablet Take 1 tablet by mouth once daily 90 tablet 0   Polyvinyl Alcohol-Povidone (REFRESH OP) Place 1 drop as needed into both eyes (for dry eyes).     No current facility-administered medications on file prior to visit.    BP 120/60   Pulse 87   Temp 98.5 F (36.9 C) (Oral)   Ht '5\' 8"'$  (1.727 m)   Wt 224 lb (101.6 kg)   SpO2 98%   BMI 34.06 kg/m       Objective:   Physical Exam Vitals and nursing note reviewed.  Constitutional:      Appearance: Normal appearance.  Skin:    General: Skin is warm and dry.     Comments:  Severely dry skin on both legs. He does have some irritation on the inside of both legs where it appears as his pants have rubbed against the skin and caused irritation   Neurological:     Mental Status: He is alert.  Psychiatric:  Mood and Affect: Mood normal.        Behavior: Behavior normal.        Thought Content: Thought content normal.       Assessment & Plan:  1. Xerosis of skin - Encouraged to switch body wash to [employer].  - Start applying a good lotion twice a day, one of those times should be when he first gets out of the shower  Dorothyann Peng, NP

## 2022-03-04 ENCOUNTER — Ambulatory Visit
Admission: EM | Admit: 2022-03-04 | Discharge: 2022-03-04 | Disposition: A | Discharge status: Home / Self Care | Payer: Medicare Other | Attending: Urgent Care | Admitting: Urgent Care

## 2022-03-04 DIAGNOSIS — J329 Chronic sinusitis, unspecified: Secondary | ICD-10-CM | POA: Diagnosis not present

## 2022-03-04 DIAGNOSIS — H6123 Impacted cerumen, bilateral: Secondary | ICD-10-CM

## 2022-03-04 MED ORDER — CARBAMIDE PEROXIDE 6.5 % OT SOLN: 5.0000 [drp] | Freq: Every day | OTIC | 0 refills | Status: AC | PRN

## 2022-03-04 MED ORDER — AMOXICILLIN 875 MG PO TABS: 875.0000 mg | ORAL_TABLET | Freq: Two times a day (BID) | ORAL | 0 refills | Status: DC

## 2022-03-04 NOTE — ED Triage Notes (Signed)
Pt c/o nasal congestion x 1 week-right earache and dizziness this am-NAD-steady gait

## 2022-03-04 NOTE — ED Provider Notes (Signed)
[facility]  Note:  This document was prepared using Systems analyst and may include unintentional dictation errors.  MRN: [id number] DOB: November 07, 1940  Subjective:   Todd Larson is a 82 y.o. male presenting for 1 week history of persistent sinus congestion, sinus pressure, drainage now having right ear pain and had some dizziness this morning.  Has a history of recurrent sinus infections.  Has had a minimal cough.  No chest pain, shortness of breath or wheezing.  No smoking.  He has had a previous episode of cerumen impaction but was not sometime ago.  No current facility-administered medications for this encounter.  Current Outpatient Medications:    clotrimazole-betamethasone (LOTRISONE) cream, Apply 1 Application topically 2 (two) times daily., Disp: 45 g, Rfl: 0   finasteride (PROSCAR) 5 MG tablet, Take 5 mg by mouth daily., Disp: , Rfl:    fluticasone (FLONASE) 50 MCG/ACT nasal spray, USE 1 SPRAY(S) IN EACH NOSTRIL TWICE DAILY AS NEEDED FOR RHINITIS, Disp: 11.1 mL, Rfl: 0   hydrochlorothiazide (HYDRODIURIL) 25 MG tablet, Take 1 tablet by mouth once daily, Disp: 90 tablet, Rfl: 1   losartan (COZAAR) 50 MG tablet, Take 1 tablet by mouth once daily, Disp: 90 tablet, Rfl: 0   Polyvinyl Alcohol-Povidone (REFRESH OP), Place 1 drop as needed into both eyes (for dry eyes)., Disp: , Rfl:    Allergies  Allergen Reactions   Amoxicillin-Pot Clavulanate Diarrhea and Other (See Comments)    Night sweats, not allergic to Amoxicillin, just can't tolerate augmentin    Past Medical History:  Diagnosis Date   Arthritis    cervical spine - T7-8 & lumbar area - bone spurs up & down the back    Bulging discs     thoracic spine   Diverticulosis of colon    GERD (gastroesophageal reflux disease)    related to intestinal system, - has started Align  4-5 days ago-    Hyperlipidemia    Hypertension    Inguinal hernia    OSA (obstructive sleep apnea)    saw  Dr. Annamaria Boots- 02/2015- pt. remarks that as a result BP med. was changed , pt. has a sleep number bed & he reports the score averages in the 80% range    PSA elevation    History of    Recurrent sinus infections    related to fracture nose x2, also reports that he has a deviated septum     Past Surgical History:  Procedure Laterality Date   COLONOSCOPY  2008   3 internal hemorrhoids, diverticulosis;Dr.Jacobs   INSERTION OF MESH N/A 11/13/2016   Procedure: INSERTION OF MESH;  Surgeon: Ralene Ok, MD;  Location: [place];  Service: General;  Laterality: N/A;   LAPAROSCOPIC INGUINAL HERNIA WITH UMBILICAL HERNIA Left [id number]   Procedure: LAPAROSCOPIC LEFT INGUINAL HERNIA  AND UMBILICAL HERNIA REPAIRS WITH MESH;  Surgeon: Ralene Ok, MD;  Location: [place];  Service: General;  Laterality: Left;   PROSTATE BIOPSY     x 2, once abnormal, once normal   TONSILLECTOMY      Family History  Problem Relation Age of Onset   Lung cancer Maternal Grandfather        enviromental    Heart attack Paternal Uncle    Prostate cancer Brother     Social History   Tobacco Use   Smoking status: Never   Smokeless tobacco: Never  Vaping Use   Vaping Use: Never used  Substance Use Topics  Alcohol use: Yes    Alcohol/week: 8.0 - 9.0 standard drinks of alcohol    Types: 8 - 9 Glasses of wine per week    Comment: social/ drinks wine(white)   Drug use: No    ROS   Objective:   Vitals: BP (!) 159/91 (BP Location: Right Arm)   Pulse 94   Temp 98.3 F (36.8 C) (Oral)   Resp 20   SpO2 95%   Physical Exam Constitutional:      General: He is not in acute distress.    Appearance: Normal appearance. He is well-developed and normal weight. He is not ill-appearing, toxic-appearing or diaphoretic.  HENT:     Head: Normocephalic and atraumatic.     Right Ear: Tympanic membrane, ear canal and external ear normal. No drainage, swelling or tenderness. No middle ear effusion. There is no impacted  cerumen. Tympanic membrane is not erythematous or bulging.     Left Ear: Tympanic membrane, ear canal and external ear normal. No drainage, swelling or tenderness.  No middle ear effusion. There is no impacted cerumen. Tympanic membrane is not erythematous or bulging.     Ears:     Comments: Ceruminous ear canals without impaction.    Nose: Congestion present. No rhinorrhea.     Mouth/Throat:     Mouth: Mucous membranes are moist.     Pharynx: No oropharyngeal exudate or posterior oropharyngeal erythema.  Eyes:     General: No scleral icterus.       Right eye: No discharge.        Left eye: No discharge.     Extraocular Movements: Extraocular movements intact.     Conjunctiva/sclera: Conjunctivae normal.  Cardiovascular:     Rate and Rhythm: Normal rate and regular rhythm.     Heart sounds: Normal heart sounds. No murmur heard.    No friction rub. No gallop.  Pulmonary:     Effort: Pulmonary effort is normal. No respiratory distress.     Breath sounds: Normal breath sounds. No stridor. No wheezing, rhonchi or rales.  Musculoskeletal:     Cervical back: Normal range of motion and neck supple. No rigidity. No muscular tenderness.  Neurological:     General: No focal deficit present.     Mental Status: He is alert and oriented to person, place, and time.  Psychiatric:        Mood and Affect: Mood normal.        Behavior: Behavior normal.        Thought Content: Thought content normal.     Assessment and Plan :   PDMP not reviewed this encounter.  1. Recurrent sinusitis   2. Excessive cerumen in both ear canals     Will manage for recurrent sinusitis with amoxicillin.  Patient has done well with this as opposed to Augmentin.  Recommended supportive care otherwise.  Also advised that he use carbamide peroxide for ceruminous ear canals.  No signs of impaction. Counseled patient on potential for adverse effects with medications prescribed/recommended today, ER and return-to-clinic  precautions discussed, patient verbalized understanding.    Jaynee Eagles, [place] 03/04/22 1525

## 2022-03-09 ENCOUNTER — Other Ambulatory Visit: Payer: Self-pay | Admitting: Adult Health

## 2022-03-09 DIAGNOSIS — I10 Essential (primary) hypertension: Secondary | ICD-10-CM

## 2022-03-13 ENCOUNTER — Ambulatory Visit (INDEPENDENT_AMBULATORY_CARE_PROVIDER_SITE_OTHER): Payer: Medicare Other | Admitting: Adult Health

## 2022-03-13 ENCOUNTER — Ambulatory Visit: Payer: Medicare Other | Admitting: Adult Health

## 2022-03-13 ENCOUNTER — Encounter: Payer: Self-pay | Admitting: Adult Health

## 2022-03-13 VITALS — BP 100/58 | HR 100 | Temp 98.0°F | Ht 68.0 in | Wt 221.0 lb

## 2022-03-13 DIAGNOSIS — R42 Dizziness and giddiness: Secondary | ICD-10-CM

## 2022-03-13 DIAGNOSIS — H6123 Impacted cerumen, bilateral: Secondary | ICD-10-CM | POA: Diagnosis not present

## 2022-03-13 NOTE — Progress Notes (Signed)
Subjective:    Patient ID: Todd Larson, male    DOB: [birth year], 82 y.o.   MRN: [id number]  HPI 82 year old male who  has a past medical history of Arthritis, Bulging discs, Diverticulosis of colon, GERD (gastroesophageal reflux disease), Hyperlipidemia, Hypertension, Inguinal hernia, OSA (obstructive sleep apnea), PSA elevation, and Recurrent sinus infections.  He was seen 9 days ago urgent care with 1 week history of persistent sinus congestion, sinus pressure, and drainage.  He presented after he started having right ear pain and some dizziness that morning.  He was prescribed amoxicillin for recurrent sinusitis and advised to follow-up with primary care due to excessive cerumen in both ear canals.  He reports that he has finished his antibiotic therapy, he also though this helped open up his sinuses but continues to have periodic episodes of dizziness and gait instability   Review of Systems See HPI   Past Medical History:  Diagnosis Date   Arthritis    cervical spine - T7-8 & lumbar area - bone spurs up & down the back    Bulging discs     thoracic spine   Diverticulosis of colon    GERD (gastroesophageal reflux disease)    related to intestinal system, - has started Align  4-5 days ago-    Hyperlipidemia    Hypertension    Inguinal hernia    OSA (obstructive sleep apnea)    saw Dr. Annamaria Boots- 02/2015- pt. remarks that as a result BP med. was changed , pt. has a sleep number bed & he reports the score averages in the 80% range    PSA elevation    History of    Recurrent sinus infections    related to fracture nose x2, also reports that he has a deviated septum    Social History   Socioeconomic History   Marital status: Single    Spouse name: Not on file   Number of children: 0   Years of education: Not on file   Highest education level: Not on file  Occupational History   Occupation: retired  Tobacco Use   Smoking status: Never   Smokeless tobacco: Never  Vaping Use    Vaping Use: Never used  Substance and Sexual Activity   Alcohol use: Yes    Alcohol/week: 8.0 - 9.0 standard drinks of alcohol    Types: 8 - 9 Glasses of wine per week    Comment: social/ drinks wine(white)   Drug use: No   Sexual activity: Not on file  Other Topics Concern   Not on file  Social History Narrative   Retired    Single    No kids       He likes to read, volunteers with an animal rescue group.       Social Determinants of Health   Financial Resource Strain: Low Risk  (11/29/2020)   Overall Financial Resource Strain (CARDIA)    Difficulty of Paying Living Expenses: Not hard at all  Food Insecurity: No Food Insecurity (11/29/2020)   Hunger Vital Sign    Worried About Running Out of Food in the Last Year: Never true    Ran Out of Food in the Last Year: Never true  Transportation Needs: No Transportation Needs (11/29/2020)   PRAPARE - Hydrologist (Medical): No    Lack of Transportation (Non-Medical): No  Physical Activity: Sufficiently Active (11/29/2020)   Exercise Vital Sign    Days of Exercise per  Week: 6 days    Minutes of Exercise per Session: 30 min  Stress: No Stress Concern Present (11/29/2020)   [place]    Feeling of Stress : Not at all  Social Connections: Socially Isolated (11/24/2019)   Social Connection and Isolation Panel [NHANES]    Frequency of Communication with Friends and Family: More than three times a week    Frequency of Social Gatherings with Friends and Family: Never    Attends Religious Services: Never    Marine scientist or Organizations: No    Attends Archivist Meetings: Never    Marital Status: Never married  Intimate Partner Violence: Not At Risk (11/24/2019)   Humiliation, Afraid, Rape, and Kick questionnaire    Fear of Current or Ex-Partner: No    Emotionally Abused: No    Physically Abused: No    Sexually Abused:  No    Past Surgical History:  Procedure Laterality Date   COLONOSCOPY  2008   3 internal hemorrhoids, diverticulosis;Dr.Jacobs   INSERTION OF MESH N/A 11/13/2016   Procedure: INSERTION OF MESH;  Surgeon: Ralene Ok, MD;  Location: [place];  Service: General;  Laterality: N/A;   LAPAROSCOPIC INGUINAL HERNIA WITH UMBILICAL HERNIA Left [id number]   Procedure: LAPAROSCOPIC LEFT INGUINAL HERNIA  AND UMBILICAL HERNIA REPAIRS WITH MESH;  Surgeon: Ralene Ok, MD;  Location: [place];  Service: General;  Laterality: Left;   PROSTATE BIOPSY     x 2, once abnormal, once normal   TONSILLECTOMY      Family History  Problem Relation Age of Onset   Lung cancer Maternal Grandfather        enviromental    Heart attack Paternal Uncle    Prostate cancer Brother     Allergies  Allergen Reactions   Amoxicillin-Pot Clavulanate Diarrhea and Other (See Comments)    Night sweats, not allergic to Amoxicillin, just can't tolerate augmentin    Current Outpatient Medications on File Prior to Visit  Medication Sig Dispense Refill   carbamide peroxide (DEBROX) 6.5 % OTIC solution Place 5 drops into both ears daily as needed. 15 mL 0   finasteride (PROSCAR) 5 MG tablet Take 5 mg by mouth daily.     fluticasone (FLONASE) 50 MCG/ACT nasal spray USE 1 SPRAY(S) IN EACH NOSTRIL TWICE DAILY AS NEEDED FOR RHINITIS 11.1 mL 0   hydrochlorothiazide (HYDRODIURIL) 25 MG tablet Take 1 tablet by mouth once daily 90 tablet 1   losartan (COZAAR) 50 MG tablet Take 1 tablet by mouth once daily 90 tablet 0   Polyvinyl Alcohol-Povidone (REFRESH OP) Place 1 drop as needed into both eyes (for dry eyes).     No current facility-administered medications on file prior to visit.    BP (!) 100/58   Pulse 100   Temp 98 F (36.7 C) (Oral)   Ht '5\' 8"'$  (1.727 m)   Wt 221 lb (100.2 kg)   SpO2 97%   BMI 33.60 kg/m       Objective:   Physical Exam Vitals and nursing note reviewed.  Constitutional:      Appearance:  Normal appearance.  HENT:     Right Ear: Tympanic membrane, ear canal and external ear normal. There is impacted cerumen.     Left Ear: Tympanic membrane, ear canal and external ear normal. There is impacted cerumen.     Nose: No nasal tenderness, mucosal edema, congestion or rhinorrhea.     Right Turbinates: Not  enlarged or swollen.     Left Turbinates: Not enlarged or swollen.  Skin:    General: Skin is warm and dry.  Neurological:     General: No focal deficit present.     Mental Status: He is alert and oriented to person, place, and time.  Psychiatric:        Mood and Affect: Mood normal.        Behavior: Behavior normal.        Thought Content: Thought content normal.        Judgment: Judgment normal.      Assessment & Plan:  1. Bilateral impacted cerumen Warm water was applied and gentle ear lavage performed  to bilateral ears. There were no complications and following the disimpaction the tympanic membrane were visible bilaterally. Tympanic membranes are intact following the procedure.  Auditory canals are normal.  The patient reported relief of symptoms after removal of cerumen. Patient tolerated procedure well.   2. Dizziness - Likely from cerumen impaction and or sinus infection - Follow up if it continues  Dorothyann Peng, NP

## 2022-04-02 ENCOUNTER — Other Ambulatory Visit: Payer: Self-pay | Admitting: Adult Health

## 2022-04-02 DIAGNOSIS — I10 Essential (primary) hypertension: Secondary | ICD-10-CM

## 2022-04-06 ENCOUNTER — Telehealth: Payer: Self-pay | Admitting: Adult Health

## 2022-04-06 NOTE — Telephone Encounter (Signed)
Calling to report he has had 2nd shingles and tetanus. Advised to have pharmacy send info with pertinent info, says pharmacy didn't do it last time so he's calling now

## 2022-04-07 ENCOUNTER — Encounter: Payer: Self-pay | Admitting: Adult Health

## 2022-04-10 NOTE — Telephone Encounter (Signed)
Noted  

## 2022-04-26 DIAGNOSIS — N401 Enlarged prostate with lower urinary tract symptoms: Secondary | ICD-10-CM | POA: Diagnosis not present

## 2022-04-26 DIAGNOSIS — N138 Other obstructive and reflux uropathy: Secondary | ICD-10-CM | POA: Diagnosis not present

## 2022-05-08 DIAGNOSIS — H10011 Acute follicular conjunctivitis, right eye: Secondary | ICD-10-CM | POA: Diagnosis not present

## 2022-05-12 DIAGNOSIS — H10011 Acute follicular conjunctivitis, right eye: Secondary | ICD-10-CM | POA: Diagnosis not present

## 2022-05-18 ENCOUNTER — Other Ambulatory Visit: Payer: Self-pay | Admitting: Adult Health

## 2022-05-18 DIAGNOSIS — H1010 Acute atopic conjunctivitis, unspecified eye: Secondary | ICD-10-CM

## 2022-06-11 ENCOUNTER — Other Ambulatory Visit: Payer: Self-pay | Admitting: Adult Health

## 2022-06-11 DIAGNOSIS — I10 Essential (primary) hypertension: Secondary | ICD-10-CM

## 2022-06-29 ENCOUNTER — Encounter: Payer: Self-pay | Admitting: Family Medicine

## 2022-06-29 ENCOUNTER — Ambulatory Visit (INDEPENDENT_AMBULATORY_CARE_PROVIDER_SITE_OTHER): Payer: Medicare Other | Admitting: Family Medicine

## 2022-06-29 VITALS — BP 128/68 | HR 94 | Temp 98.3°F | Wt 217.0 lb

## 2022-06-29 DIAGNOSIS — L918 Other hypertrophic disorders of the skin: Secondary | ICD-10-CM

## 2022-06-29 DIAGNOSIS — L309 Dermatitis, unspecified: Secondary | ICD-10-CM

## 2022-06-29 NOTE — Progress Notes (Signed)
   Subjective:    Patient ID: Todd Larson, male    DOB: 1940-11-11, 82 y.o.   MRN: [id number]  HPI Here for 2 issues. First he has had a skin tag on the right side of his neck for a year or so, and it frequently gets very irritated from rubbing on clothing. He wants to have it removed. Also he developed a patch of dry skin on the left leg about 2 months. At first it was red and itchy, but now it is fading away. He is applying Aveeno to it.    Review of Systems  Constitutional: Negative.   Respiratory: Negative.    Cardiovascular: Negative.   Skin:  Positive for rash.       Objective:   Physical Exam Constitutional:      Appearance: Normal appearance.  Cardiovascular:     Rate and Rhythm: Normal rate and regular rhythm.     Pulses: Normal pulses.     Heart sounds: Normal heart sounds.  Skin:    Comments: He has a number of small skin tags around the neck. The one in question is on the right side of the neck and is very inflamed. He also has a 2 cm patch of pink scaly skin on the right anterior thigh just above the knee   Neurological:     Mental Status: He is alert.           Assessment & Plan:  He has an inflamed skin tag. After informed consent was obtained, we cleansed the area with alcohol. The tag was then excised using scissors. Bleeding was minimal. This was dressed with a Bandaid. He tolerated the procedure quite well. He also has a ptch of eczema on the right leg. I suggetsed he apply 1% hydrocortisone cream to it BID.  Gershon Crane, MD

## 2022-07-02 ENCOUNTER — Other Ambulatory Visit: Payer: Self-pay | Admitting: Adult Health

## 2022-07-02 DIAGNOSIS — I10 Essential (primary) hypertension: Secondary | ICD-10-CM

## 2022-07-20 ENCOUNTER — Encounter: Payer: Self-pay | Admitting: Family Medicine

## 2022-07-20 ENCOUNTER — Ambulatory Visit (INDEPENDENT_AMBULATORY_CARE_PROVIDER_SITE_OTHER): Payer: Medicare Other | Admitting: Family Medicine

## 2022-07-20 VITALS — BP 118/64 | HR 90 | Temp 98.2°F | Wt 218.0 lb

## 2022-07-20 DIAGNOSIS — K4090 Unilateral inguinal hernia, without obstruction or gangrene, not specified as recurrent: Secondary | ICD-10-CM | POA: Diagnosis not present

## 2022-07-20 NOTE — Progress Notes (Signed)
   Subjective:    Patient ID: Todd Larson, male    DOB: [birth year], 82 y.o.   MRN: [id number]  HPI Here for one week of intermittent pain in the left groin, and he has been having to strain to pass BM's. His stool is firmer than normal, but not hard. There is no pain with passing a stool. No urinary symptoms or testicle pain. He had a laparoscopic left inguinal hernia repair per Dr. Derrell Lolling on 11-13-16.    Review of Systems  Constitutional: Negative.   Respiratory: Negative.    Cardiovascular: Negative.   Gastrointestinal:  Positive for constipation. Negative for abdominal distention, abdominal pain, anal bleeding, diarrhea, nausea, rectal pain and vomiting.  Genitourinary:  Negative for difficulty urinating, dysuria, flank pain, frequency, hematuria, testicular pain and urgency.       Objective:   Physical Exam Constitutional:      General: He is not in acute distress. Cardiovascular:     Rate and Rhythm: Normal rate and regular rhythm.     Pulses: Normal pulses.     Heart sounds: Normal heart sounds.  Pulmonary:     Effort: Pulmonary effort is normal.     Breath sounds: Normal breath sounds.  Abdominal:     General: Bowel sounds are normal. There is no distension.     Palpations: Abdomen is soft. There is no mass.     Tenderness: There is no abdominal tenderness. There is no guarding or rebound.     Comments: There is a small direct left inguinal hernia   Neurological:     Mental Status: He is alert.           Assessment & Plan:  Recurrent inguinal hernia. We will refer him back to Surgery. He can use Miralax daily to soften the stools.  Gershon Crane, MD

## 2022-07-30 ENCOUNTER — Other Ambulatory Visit: Payer: Self-pay | Admitting: Adult Health

## 2022-07-30 DIAGNOSIS — I10 Essential (primary) hypertension: Secondary | ICD-10-CM

## 2022-08-08 ENCOUNTER — Encounter: Payer: Self-pay | Admitting: Adult Health

## 2022-08-08 ENCOUNTER — Ambulatory Visit: Payer: Medicare Other | Admitting: Adult Health

## 2022-08-08 VITALS — BP 130/78 | Temp 98.3°F | Ht 68.0 in | Wt 218.0 lb

## 2022-08-08 DIAGNOSIS — I10 Essential (primary) hypertension: Secondary | ICD-10-CM

## 2022-08-08 DIAGNOSIS — E782 Mixed hyperlipidemia: Secondary | ICD-10-CM | POA: Diagnosis not present

## 2022-08-08 DIAGNOSIS — R7303 Prediabetes: Secondary | ICD-10-CM | POA: Diagnosis not present

## 2022-08-08 DIAGNOSIS — N400 Enlarged prostate without lower urinary tract symptoms: Secondary | ICD-10-CM | POA: Diagnosis not present

## 2022-08-08 DIAGNOSIS — K5900 Constipation, unspecified: Secondary | ICD-10-CM | POA: Diagnosis not present

## 2022-08-08 DIAGNOSIS — E559 Vitamin D deficiency, unspecified: Secondary | ICD-10-CM

## 2022-08-08 LAB — COMPREHENSIVE METABOLIC PANEL
ALT: 21 U/L (ref 0–53)
AST: 18 U/L (ref 0–37)
Albumin: 4.5 g/dL (ref 3.5–5.2)
Alkaline Phosphatase: 54 U/L (ref 39–117)
BUN: 13 mg/dL (ref 6–23)
CO2: 30 mEq/L (ref 19–32)
Calcium: 9.7 mg/dL (ref 8.4–10.5)
Chloride: 100 mEq/L (ref 96–112)
Creatinine, Ser: 0.96 mg/dL (ref 0.40–1.50)
GFR: 74.89 mL/min (ref >60.00)
Glucose, Bld: 114 mg/dL — ABNORMAL HIGH (ref 70–99)
Potassium: 4.4 mEq/L (ref 3.5–5.1)
Sodium: 139 mEq/L (ref 135–145)
Total Bilirubin: 1.5 mg/dL — ABNORMAL HIGH (ref 0.2–1.2)
Total Protein: 6.8 g/dL (ref 6.0–8.3)

## 2022-08-08 LAB — CBC
HCT: 47.4 % (ref 39.0–52.0)
Hemoglobin: 15.9 g/dL (ref 13.0–17.0)
MCHC: 33.6 g/dL (ref 30.0–36.0)
MCV: 95.1 fl (ref 78.0–100.0)
Platelets: 345 10*3/uL (ref 150.0–400.0)
RBC: 4.99 Mil/uL (ref 4.22–5.81)
RDW: 14.5 % (ref 11.5–15.5)
WBC: 9.4 10*3/uL (ref 4.0–10.5)

## 2022-08-08 LAB — HEMOGLOBIN A1C: Hgb A1c MFr Bld: 5 % (ref 4.6–6.5)

## 2022-08-08 LAB — LIPID PANEL
Cholesterol: 144 mg/dL (ref 0–200)
HDL: 48.8 mg/dL (ref >39.00)
LDL Cholesterol: 83 mg/dL (ref 0–99)
NonHDL: 95.66
Total CHOL/HDL Ratio: 3
Triglycerides: 63 mg/dL (ref 0.0–149.0)
VLDL: 12.6 mg/dL (ref 0.0–40.0)

## 2022-08-08 LAB — TSH: TSH: 2.24 u[IU]/mL (ref 0.35–5.50)

## 2022-08-08 LAB — VITAMIN D 25 HYDROXY (VIT D DEFICIENCY, FRACTURES): VITD: 39.41 ng/mL (ref 30.00–100.00)

## 2022-08-08 LAB — PSA: PSA: 1.93 ng/mL (ref 0.10–4.00)

## 2022-08-08 MED ORDER — LOSARTAN POTASSIUM 50 MG PO TABS: 50.0000 mg | ORAL_TABLET | Freq: Every day | ORAL | 3 refills | Status: DC

## 2022-08-08 MED ORDER — HYDROCHLOROTHIAZIDE 25 MG PO TABS: 25.0000 mg | ORAL_TABLET | Freq: Every day | ORAL | 3 refills | Status: DC

## 2022-08-08 NOTE — Progress Notes (Signed)
Subjective:    Patient ID: Todd Larson, male    DOB: [birth year], 82 y.o.   MRN: [id number]  HPI Patient presents for yearly preventative medicine examination. He is a pleasant 82 year old male who  has a past medical history of Arthritis, Bulging discs, Diverticulosis of colon, GERD (gastroesophageal reflux disease), Hyperlipidemia, Hypertension, Inguinal hernia, OSA (obstructive sleep apnea), PSA elevation, and Recurrent sinus infections.  Hypertension-managed with losartan 50 mg daily and HCTZ 25 mg daily.  He denies dizziness, lightheadedness, chest pain, or shortness of breath BP Readings from Last 3 Encounters:  08/08/22 130/78  07/20/22 118/64  06/29/22 128/68   Hyperlipidemia-diet controlled.  Not currently on any medication Lab Results  Component Value Date   CHOL 141 08/04/2021   HDL 47.80 08/04/2021   LDLCALC 81 08/04/2021   TRIG 61.0 08/04/2021   CHOLHDL 3 08/04/2021   Glucose intolerance-managed with diet. Lab Results  Component Value Date   HGBA1C 5.4 08/04/2021   BPH-takes Proscar 5 mg daily, this is managed by urology.  He does feel well controlled  Vitamin D deficiency - he does take 2000 units Vitamin D twice a week plus a multiple vitamin   Left inguinal hernia - recurrent. Had repair in 2018 but symptoms have come back. He has a follow up appointment with surgery later today. He has intermittent pain and has to strain on occasion to have a BM.  All immunizations and health maintenance protocols were reviewed with the patient and needed orders were placed.  Appropriate screening laboratory values were ordered for the patient including screening of hyperlipidemia, renal function and hepatic function. If indicated by BPH, a PSA was ordered.  Medication reconciliation,  past medical history, social history, problem list and allergies were reviewed in detail with the patient  Goals were established with regard to weight loss, exercise, and  diet in compliance  with medications. He is walking about 30 minutes a day  Wt Readings from Last 3 Encounters:  08/08/22 218 lb (98.9 kg)  07/20/22 218 lb (98.9 kg)  06/29/22 217 lb (98.4 kg)   Review of Systems  Constitutional: Negative.   HENT: Negative.    Eyes: Negative.   Respiratory: Negative.    Cardiovascular: Negative.   Gastrointestinal: Negative.   Endocrine: Negative.   Genitourinary: Negative.   Musculoskeletal: Negative.   Skin: Negative.   Allergic/Immunologic: Negative.   Neurological: Negative.   Hematological: Negative.   Psychiatric/Behavioral: Negative.    All other systems reviewed and are negative.  Past Medical History:  Diagnosis Date   Arthritis    cervical spine - T7-8 & lumbar area - bone spurs up & down the back    Bulging discs     thoracic spine   Diverticulosis of colon    GERD (gastroesophageal reflux disease)    related to intestinal system, - has started Align  4-5 days ago-    Hyperlipidemia    Hypertension    Inguinal hernia    OSA (obstructive sleep apnea)    saw Dr. Maple Hudson- 02/2015- pt. remarks that as a result BP med. was changed , pt. has a sleep number bed & he reports the score averages in the 80% range    PSA elevation    History of    Recurrent sinus infections    related to fracture nose x2, also reports that he has a deviated septum    Social History   Socioeconomic History   Marital status: Single  Spouse name: Not on file   Number of children: 0   Years of education: Not on file   Highest education level: Not on file  Occupational History   Occupation: retired  Tobacco Use   Smoking status: Never   Smokeless tobacco: Never  Vaping Use   Vaping status: Never Used  Substance and Sexual Activity   Alcohol use: Yes    Alcohol/week: 8.0 - 9.0 standard drinks of alcohol    Types: 8 - 9 Glasses of wine per week    Comment: social/ drinks wine(white)   Drug use: No   Sexual activity: Not on file  Other Topics Concern   Not on file   Social History Narrative   Retired    Single    No kids       He likes to read, volunteers with an animal rescue group.       Social Determinants of Health   Financial Resource Strain: Low Risk  (11/29/2020)   Overall Financial Resource Strain (CARDIA)    Difficulty of Paying Living Expenses: Not hard at all  Food Insecurity: No Food Insecurity (11/29/2020)   Hunger Vital Sign    Worried About Running Out of Food in the Last Year: Never true    Ran Out of Food in the Last Year: Never true  Transportation Needs: No Transportation Needs (11/29/2020)   PRAPARE - Administrator, Civil Service (Medical): No    Lack of Transportation (Non-Medical): No  Physical Activity: Sufficiently Active (11/29/2020)   Exercise Vital Sign    Days of Exercise per Week: 6 days    Minutes of Exercise per Session: 30 min  Stress: No Stress Concern Present (11/29/2020)   [employer] of Occupational Health - Occupational Stress Questionnaire    Feeling of Stress : Not at all  Social Connections: Socially Isolated (11/24/2019)   Social Connection and Isolation Panel [NHANES]    Frequency of Communication with Friends and Family: More than three times a week    Frequency of Social Gatherings with Friends and Family: Never    Attends Religious Services: Never    Database administrator or Organizations: No    Attends Banker Meetings: Never    Marital Status: Never married  Intimate Partner Violence: Not At Risk (11/24/2019)   Humiliation, Afraid, Rape, and Kick questionnaire    Fear of Current or Ex-Partner: No    Emotionally Abused: No    Physically Abused: No    Sexually Abused: No    Past Surgical History:  Procedure Laterality Date   COLONOSCOPY  2008   3 internal hemorrhoids, diverticulosis;Dr.Jacobs   INSERTION OF MESH N/A 11/13/2016   Procedure: INSERTION OF MESH;  Surgeon: Axel Filler, MD;  Location: [facility] OR;  Service: General;  Laterality: N/A;    LAPAROSCOPIC INGUINAL HERNIA WITH UMBILICAL HERNIA Left 11/13/2016   Procedure: LAPAROSCOPIC LEFT INGUINAL HERNIA  AND UMBILICAL HERNIA REPAIRS WITH MESH;  Surgeon: Axel Filler, MD;  Location: [facility] OR;  Service: General;  Laterality: Left;   PROSTATE BIOPSY     x 2, once abnormal, once normal   TONSILLECTOMY      Family History  Problem Relation Age of Onset   Lung cancer Maternal Grandfather        enviromental    Heart attack Paternal Uncle    Prostate cancer Brother     Allergies  Allergen Reactions   Amoxicillin-Pot Clavulanate Diarrhea and Other (See Comments)    Night  sweats, not allergic to Amoxicillin, just can't tolerate augmentin    Current Outpatient Medications on File Prior to Visit  Medication Sig Dispense Refill   carbamide peroxide (DEBROX) 6.5 % OTIC solution Place 5 drops into both ears daily as needed. 15 mL 0   finasteride (PROSCAR) 5 MG tablet Take 5 mg by mouth daily.     fluticasone (FLONASE) 50 MCG/ACT nasal spray USE 1 SPRAY(S) IN EACH NOSTRIL TWICE DAILY AS NEEDED FOR RUNNY NOSE 16 g 0   hydrochlorothiazide (HYDRODIURIL) 25 MG tablet TAKE 1 TABLET BY MOUTH ONCE DAILY . APPOINTMENT REQUIRED FOR FUTURE REFILLS 30 tablet 0   losartan (COZAAR) 50 MG tablet Take 1 tablet by mouth once daily 90 tablet 0   Polyvinyl Alcohol-Povidone (REFRESH OP) Place 1 drop as needed into both eyes (for dry eyes).     No current facility-administered medications on file prior to visit.    BP 130/78   Temp 98.3 F (36.8 C) (Oral)   Ht 5\' 8"  (1.727 m)   Wt 218 lb (98.9 kg)   BMI 33.15 kg/m       Objective:   Physical Exam Vitals and nursing note reviewed.  Constitutional:      General: He is not in acute distress.    Appearance: Normal appearance. He is obese. He is not ill-appearing.  HENT:     Head: Normocephalic and atraumatic.     Right Ear: Tympanic membrane, ear canal and external ear normal. There is no impacted cerumen.     Left Ear: Tympanic membrane,  ear canal and external ear normal. There is no impacted cerumen.     Nose: Nose normal. No congestion or rhinorrhea.     Mouth/Throat:     Mouth: Mucous membranes are moist.     Pharynx: Oropharynx is clear.  Eyes:     Extraocular Movements: Extraocular movements intact.     Conjunctiva/sclera: Conjunctivae normal.     Pupils: Pupils are equal, round, and reactive to light.  Neck:     Vascular: No carotid bruit.  Cardiovascular:     Rate and Rhythm: Normal rate and regular rhythm.     Pulses: Normal pulses.     Heart sounds: No murmur heard.    No friction rub. No gallop.  Pulmonary:     Effort: Pulmonary effort is normal.     Breath sounds: Normal breath sounds.  Abdominal:     General: Abdomen is flat. Bowel sounds are normal. There is no distension.     Palpations: Abdomen is soft. There is no mass.     Tenderness: There is no abdominal tenderness. There is no guarding or rebound.     Hernia: No hernia is present.  Musculoskeletal:        General: Normal range of motion.     Cervical back: Normal range of motion and neck supple.  Lymphadenopathy:     Cervical: No cervical adenopathy.  Skin:    General: Skin is warm and dry.     Capillary Refill: Capillary refill takes less than 2 seconds.  Neurological:     General: No focal deficit present.     Mental Status: He is alert and oriented to person, place, and time.  Psychiatric:        Mood and Affect: Mood normal.        Behavior: Behavior normal.        Thought Content: Thought content normal.        Judgment: Judgment normal.  Assessment & Plan:  1. Essential hypertension - Well controlled. No change in medication  - Lipid panel; Future - TSH; Future - CBC; Future - Comprehensive metabolic panel; Future - hydrochlorothiazide (HYDRODIURIL) 25 MG tablet; Take 1 tablet (25 mg total) by mouth daily.  Dispense: 90 tablet; Refill: 3 - losartan (COZAAR) 50 MG tablet; Take 1 tablet (50 mg total) by mouth daily.   Dispense: 90 tablet; Refill: 3  2. Mixed hyperlipidemia - Consider statin  - Lipid panel; Future - TSH; Future - CBC; Future - Comprehensive metabolic panel; Future  3. Pre-diabetes - Consider Metformin  - Lipid panel; Future - TSH; Future - CBC; Future - Comprehensive metabolic panel; Future - Hemoglobin A1c; Future  4. Benign prostatic hyperplasia without lower urinary tract symptoms - Continue Proscar. Follow up with Urology as directed  - PSA; Future  5. Vitamin D deficiency  - VITAMIN D 25 Hydroxy (Vit-D Deficiency, Fractures); Future  Shirline Frees, NP

## 2022-08-08 NOTE — Patient Instructions (Signed)
It was great seeing you today   We will follow up with you regarding your lab work   Please let me know if you need anything   

## 2022-09-08 DIAGNOSIS — Z23 Encounter for immunization: Secondary | ICD-10-CM | POA: Diagnosis not present

## 2022-09-10 ENCOUNTER — Telehealth: Payer: Self-pay | Admitting: Adult Health

## 2022-09-10 NOTE — Telephone Encounter (Signed)
Recently received immunizations, flu and Radiographer, therapeutic covid at  [facility] [address], [place] - [address] Phone: [phone]  Fax: [phone]    Requesting this be added to his chart

## 2022-09-11 NOTE — Telephone Encounter (Signed)
Noted  

## 2022-09-12 ENCOUNTER — Ambulatory Visit (INDEPENDENT_AMBULATORY_CARE_PROVIDER_SITE_OTHER): Payer: Medicare Other | Admitting: Adult Health

## 2022-09-12 ENCOUNTER — Encounter: Payer: Self-pay | Admitting: Adult Health

## 2022-09-12 VITALS — BP 118/62 | HR 87 | Temp 98.2°F | Ht 68.0 in | Wt 219.0 lb

## 2022-09-12 DIAGNOSIS — N481 Balanitis: Secondary | ICD-10-CM

## 2022-09-12 MED ORDER — CLOTRIMAZOLE-BETAMETHASONE 1-0.05 % EX CREA: 1.0000 | TOPICAL_CREAM | Freq: Every day | CUTANEOUS | 0 refills | Status: DC

## 2022-09-12 MED ORDER — FLUCONAZOLE 150 MG PO TABS: ORAL_TABLET | ORAL | 0 refills | Status: DC

## 2022-09-12 NOTE — Progress Notes (Signed)
Subjective:    Patient ID: Todd Larson, male    DOB: 07-12-1940, 82 y.o.   MRN: [id number]  HPI  82 year old male who  has a past medical history of Arthritis, Bulging discs, Diverticulosis of colon, GERD (gastroesophageal reflux disease), Hyperlipidemia, Hypertension, Inguinal hernia, OSA (obstructive sleep apnea), PSA elevation, and Recurrent sinus infections.  He presents to the office today for an acute issue.  He is concerned about a possible infection on the head of his penis.  He noted buildup under his foreskin yesterday, when he got in the shower to wash he had pain and irritation and noticed that the head of his penis was red.  He has been applying Neosporin but has not noticed any improvement  Review of Systems See HPI   Past Medical History:  Diagnosis Date   Arthritis    cervical spine - T7-8 & lumbar area - bone spurs up & down the back    Bulging discs     thoracic spine   Diverticulosis of colon    GERD (gastroesophageal reflux disease)    related to intestinal system, - has started Align  4-5 days ago-    Hyperlipidemia    Hypertension    Inguinal hernia    OSA (obstructive sleep apnea)    saw Dr. Maple Hudson- 02/2015- pt. remarks that as a result BP med. was changed , pt. has a sleep number bed & he reports the score averages in the 80% range    PSA elevation    History of    Recurrent sinus infections    related to fracture nose x2, also reports that he has a deviated septum    Social History   Socioeconomic History   Marital status: Single    Spouse name: Not on file   Number of children: 0   Years of education: Not on file   Highest education level: Not on file  Occupational History   Occupation: retired  Tobacco Use   Smoking status: Never   Smokeless tobacco: Never  Vaping Use   Vaping status: Never Used  Substance and Sexual Activity   Alcohol use: Yes    Alcohol/week: 8.0 - 9.0 standard drinks of alcohol    Types: 8 - 9 Glasses of wine per week     Comment: social/ drinks wine(white)   Drug use: No   Sexual activity: Not on file  Other Topics Concern   Not on file  Social History Narrative   Retired    Single    No kids       He likes to read, volunteers with an animal rescue group.       Social Determinants of Health   Financial Resource Strain: Low Risk  (11/29/2020)   Overall Financial Resource Strain (CARDIA)    Difficulty of Paying Living Expenses: Not hard at all  Food Insecurity: No Food Insecurity (11/29/2020)   Hunger Vital Sign    Worried About Running Out of Food in the Last Year: Never true    Ran Out of Food in the Last Year: Never true  Transportation Needs: No Transportation Needs (11/29/2020)   PRAPARE - Administrator, Civil Service (Medical): No    Lack of Transportation (Non-Medical): No  Physical Activity: Sufficiently Active (11/29/2020)   Exercise Vital Sign    Days of Exercise per Week: 6 days    Minutes of Exercise per Session: 30 min  Stress: No Stress Concern Present (11/29/2020)  [employer] of Occupational Health - Occupational Stress Questionnaire    Feeling of Stress : Not at all  Social Connections: Socially Isolated (11/24/2019)   Social Connection and Isolation Panel [NHANES]    Frequency of Communication with Friends and Family: More than three times a week    Frequency of Social Gatherings with Friends and Family: Never    Attends Religious Services: Never    Database administrator or Organizations: No    Attends Banker Meetings: Never    Marital Status: Never married  Intimate Partner Violence: Not At Risk (11/24/2019)   Humiliation, Afraid, Rape, and Kick questionnaire    Fear of Current or Ex-Partner: No    Emotionally Abused: No    Physically Abused: No    Sexually Abused: No    Past Surgical History:  Procedure Laterality Date   COLONOSCOPY  2008   3 internal hemorrhoids, diverticulosis;Dr.Jacobs   INSERTION OF MESH N/A 11/13/2016    Procedure: INSERTION OF MESH;  Surgeon: Axel Filler, MD;  Location: [facility] OR;  Service: General;  Laterality: N/A;   LAPAROSCOPIC INGUINAL HERNIA WITH UMBILICAL HERNIA Left 11/13/2016   Procedure: LAPAROSCOPIC LEFT INGUINAL HERNIA  AND UMBILICAL HERNIA REPAIRS WITH MESH;  Surgeon: Axel Filler, MD;  Location: [facility] OR;  Service: General;  Laterality: Left;   PROSTATE BIOPSY     x 2, once abnormal, once normal   TONSILLECTOMY      Family History  Problem Relation Age of Onset   Lung cancer Maternal Grandfather        enviromental    Heart attack Paternal Uncle    Prostate cancer Brother     Allergies  Allergen Reactions   Amoxicillin-Pot Clavulanate Diarrhea and Other (See Comments)    Night sweats, not allergic to Amoxicillin, just can't tolerate augmentin    Current Outpatient Medications on File Prior to Visit  Medication Sig Dispense Refill   carbamide peroxide (DEBROX) 6.5 % OTIC solution Place 5 drops into both ears daily as needed. 15 mL 0   finasteride (PROSCAR) 5 MG tablet Take 5 mg by mouth daily.     fluticasone (FLONASE) 50 MCG/ACT nasal spray USE 1 SPRAY(S) IN EACH NOSTRIL TWICE DAILY AS NEEDED FOR RUNNY NOSE 16 g 0   hydrochlorothiazide (HYDRODIURIL) 25 MG tablet Take 1 tablet (25 mg total) by mouth daily. 90 tablet 3   losartan (COZAAR) 50 MG tablet Take 1 tablet (50 mg total) by mouth daily. 90 tablet 3   Polyvinyl Alcohol-Povidone (REFRESH OP) Place 1 drop as needed into both eyes (for dry eyes).     No current facility-administered medications on file prior to visit.    BP 118/62 (BP Location: Left Arm, Patient Position: Sitting, Cuff Size: Large)   Pulse 87   Temp 98.2 F (36.8 C) (Oral)   Ht 5\' 8"  (1.727 m)   Wt 219 lb (99.3 kg)   SpO2 96%   BMI 33.30 kg/m       Objective:   Physical Exam Vitals and nursing note reviewed.  Constitutional:      Appearance: Normal appearance.  Genitourinary:    Penis: Uncircumcised. Discharge present.       Comments: Red flat rash noted on the head of his penis.  Some scant white discharge noticed on foreskin. Skin:    General: Skin is warm and dry.  Neurological:     General: No focal deficit present.     Mental Status: He is alert and oriented to  person, place, and time.  Psychiatric:        Mood and Affect: Mood normal.        Behavior: Behavior normal.        Thought Content: Thought content normal.        Judgment: Judgment normal.        Assessment & Plan:  1. Balanitis - Appears to be fungal in origin. Will send in Diflucan and lotrisone cream  - Follow up if not resolved in the next 7-10 days  - fluconazole (DIFLUCAN) 150 MG tablet; Take one tab today and take the second tab three days later  Dispense: 2 tablet; Refill: 0 - clotrimazole-betamethasone (LOTRISONE) cream; Apply 1 Application topically daily.  Dispense: 30 g; Refill: 0 - Stop using Neosporin   Shirline Frees, NP  Time spent with patient today was 31 minutes which consisted of chart review, discussing Balanitis , work up, treatment answering questions and documentation.

## 2022-10-09 DIAGNOSIS — N138 Other obstructive and reflux uropathy: Secondary | ICD-10-CM | POA: Diagnosis not present

## 2022-10-09 DIAGNOSIS — N401 Enlarged prostate with lower urinary tract symptoms: Secondary | ICD-10-CM | POA: Diagnosis not present

## 2022-10-26 DIAGNOSIS — N401 Enlarged prostate with lower urinary tract symptoms: Secondary | ICD-10-CM | POA: Diagnosis not present

## 2022-10-26 DIAGNOSIS — N138 Other obstructive and reflux uropathy: Secondary | ICD-10-CM | POA: Diagnosis not present

## 2022-11-01 DIAGNOSIS — H2513 Age-related nuclear cataract, bilateral: Secondary | ICD-10-CM | POA: Diagnosis not present

## 2022-11-01 DIAGNOSIS — H524 Presbyopia: Secondary | ICD-10-CM | POA: Diagnosis not present

## 2022-11-01 DIAGNOSIS — H52223 Regular astigmatism, bilateral: Secondary | ICD-10-CM | POA: Diagnosis not present

## 2022-12-19 ENCOUNTER — Ambulatory Visit: Payer: Medicare Other

## 2022-12-19 VITALS — BP 120/60 | Temp 98.3°F | Ht 68.0 in | Wt 218.9 lb

## 2022-12-19 DIAGNOSIS — Z0000 Encounter for general adult medical examination without abnormal findings: Secondary | ICD-10-CM

## 2022-12-19 NOTE — Patient Instructions (Addendum)
Todd Larson , Thank you for taking time to come for your Medicare Wellness Visit. I appreciate your ongoing commitment to your health goals. Please review the following plan we discussed and let me know if I can assist you in the future.   Referrals/Orders/Follow-Ups/Clinician Recommendations:   This is a list of the screening recommended for you and due dates:  Health Maintenance  Topic Date Due   COVID-19 Vaccine (6 - 2024-25 season) 09/02/2022   Medicare Annual Wellness Visit  12/19/2023   DTaP/Tdap/Td vaccine (3 - Tdap) 04/01/2032   Pneumonia Vaccine  Completed   Flu Shot  Completed   Zoster (Shingles) Vaccine  Completed   HPV Vaccine  Aged Out   Hepatitis C Screening  Discontinued   Cologuard (Stool DNA test)  Discontinued    Advanced directives: (Copy Requested) Please bring a copy of your health care power of attorney and living will to the office to be added to your chart at your convenience.  Next Medicare Annual Wellness Visit scheduled for next year: Yes

## 2022-12-19 NOTE — Progress Notes (Cosign Needed Addendum)
Subjective:   Todd Larson is a 82 y.o. male who presents for Medicare Annual/Subsequent preventive examination.  Visit Complete: In person    Cardiac Risk Factors include: advanced age (>25men, >58 women);male gender;hypertension     Objective:    Today's Vitals   12/19/22 1636  BP: 120/60  Temp: 98.3 F (36.8 C)  TempSrc: Oral  Weight: 218 lb 14.4 oz (99.3 kg)  Height: 5\' 8"  (1.727 m)   Body mass index is 33.28 kg/m.     12/19/2022    4:51 PM 10/02/2021    5:41 AM 11/29/2020   10:02 AM 11/24/2019    9:34 AM 01/27/2019   11:30 AM 11/07/2016   11:23 AM 12/07/2014   11:56 AM  Advanced Directives  Does Patient Have a Medical Advance Directive? Yes No No No No No No  Type of Estate agent of [place];Living will        Copy of Healthcare Power of Attorney in Chart? No - copy requested        Would patient like information on creating a medical advance directive?    No - Patient declined No - Patient declined Yes (MAU/Ambulatory/Procedural Areas - Information given) Yes - Educational materials given    Current Medications (verified) Outpatient Encounter Medications as of 12/19/2022  Medication Sig   carbamide peroxide (DEBROX) 6.5 % OTIC solution Place 5 drops into both ears daily as needed.   clotrimazole-betamethasone (LOTRISONE) cream Apply 1 Application topically daily.   finasteride (PROSCAR) 5 MG tablet Take 5 mg by mouth daily.   fluconazole (DIFLUCAN) 150 MG tablet Take one tab today and take the second tab three days later   fluticasone (FLONASE) 50 MCG/ACT nasal spray USE 1 SPRAY(S) IN EACH NOSTRIL TWICE DAILY AS NEEDED FOR RUNNY NOSE   hydrochlorothiazide (HYDRODIURIL) 25 MG tablet Take 1 tablet (25 mg total) by mouth daily.   losartan (COZAAR) 50 MG tablet Take 1 tablet (50 mg total) by mouth daily.   Polyvinyl Alcohol-Povidone (REFRESH OP) Place 1 drop as needed into both eyes (for dry eyes).   No facility-administered encounter  medications on file as of 12/19/2022.    Allergies (verified) Amoxicillin-pot clavulanate   History: Past Medical History:  Diagnosis Date   Arthritis    cervical spine - T7-8 & lumbar area - bone spurs up & down the back    Bulging discs     thoracic spine   Diverticulosis of colon    GERD (gastroesophageal reflux disease)    related to intestinal system, - has started Align  4-5 days ago-    Hyperlipidemia    Hypertension    Inguinal hernia    OSA (obstructive sleep apnea)    saw Dr. Maple Hudson- 02/2015- pt. remarks that as a result BP med. was changed , pt. has a sleep number bed & he reports the score averages in the 80% range    PSA elevation    History of    Recurrent sinus infections    related to fracture nose x2, also reports that he has a deviated septum   Past Surgical History:  Procedure Laterality Date   COLONOSCOPY  2008   3 internal hemorrhoids, diverticulosis;Dr.Jacobs   INSERTION OF MESH N/A 11/13/2016   Procedure: INSERTION OF MESH;  Surgeon: Axel Filler, MD;  Location: [facility] OR;  Service: General;  Laterality: N/A;   LAPAROSCOPIC INGUINAL HERNIA WITH UMBILICAL HERNIA Left 11/13/2016   Procedure: LAPAROSCOPIC LEFT INGUINAL HERNIA  AND UMBILICAL HERNIA  REPAIRS WITH MESH;  Surgeon: Axel Filler, MD;  Location: [facility] OR;  Service: General;  Laterality: Left;   PROSTATE BIOPSY     x 2, once abnormal, once normal   TONSILLECTOMY     Family History  Problem Relation Age of Onset   Lung cancer Maternal Grandfather        enviromental    Heart attack Paternal Uncle    Prostate cancer Brother    Social History   Socioeconomic History   Marital status: Single    Spouse name: Not on file   Number of children: 0   Years of education: Not on file   Highest education level: Not on file  Occupational History   Occupation: retired  Tobacco Use   Smoking status: Never   Smokeless tobacco: Never  Vaping Use   Vaping status: Never Used  Substance and Sexual  Activity   Alcohol use: Yes    Alcohol/week: 8.0 - 9.0 standard drinks of alcohol    Types: 8 - 9 Glasses of wine per week    Comment: social/ drinks wine(white)   Drug use: No   Sexual activity: Not on file  Other Topics Concern   Not on file  Social History Narrative   Retired    Single    No kids       He likes to read, volunteers with an animal rescue group.       Social Drivers of Corporate investment banker Strain: Low Risk  (12/19/2022)   Overall Financial Resource Strain (CARDIA)    Difficulty of Paying Living Expenses: Not hard at all  Food Insecurity: No Food Insecurity (12/19/2022)   Hunger Vital Sign    Worried About Running Out of Food in the Last Year: Never true    Ran Out of Food in the Last Year: Never true  Transportation Needs: No Transportation Needs (12/19/2022)   PRAPARE - Administrator, Civil Service (Medical): No    Lack of Transportation (Non-Medical): No  Physical Activity: Sufficiently Active (12/19/2022)   Exercise Vital Sign    Days of Exercise per Week: 7 days    Minutes of Exercise per Session: 30 min  Stress: No Stress Concern Present (12/19/2022)   [employer] of Occupational Health - Occupational Stress Questionnaire    Feeling of Stress : Not at all  Social Connections: Socially Isolated (12/19/2022)   Social Connection and Isolation Panel [NHANES]    Frequency of Communication with Friends and Family: More than three times a week    Frequency of Social Gatherings with Friends and Family: More than three times a week    Attends Religious Services: Never    Database administrator or Organizations: No    Attends Engineer, structural: Never    Marital Status: Never married    Tobacco Counseling Counseling given: Not Answered   Clinical Intake:  Pre-visit preparation completed: Yes  Pain : No/denies pain     BMI - recorded: 33.28 Nutritional Status: BMI > 30  Obese Nutritional Risks: None Diabetes:  No  How often do you need to have someone help you when you read instructions, pamphlets, or other written materials from your doctor or pharmacy?: 1 - Never  Interpreter Needed?: No  Information entered by :: Theresa Mulligan LPN   Activities of Daily Living    12/19/2022    4:49 PM  In your present state of health, do you have any difficulty performing the following  activities:  Hearing? 0  Vision? 0  Difficulty concentrating or making decisions? 0  Walking or climbing stairs? 0  Dressing or bathing? 0  Doing errands, shopping? 0  Preparing Food and eating ? N  Using the Toilet? N  In the past six months, have you accidently leaked urine? N  Do you have problems with loss of bowel control? N  Managing your Medications? N  Managing your Finances? N  Housekeeping or managing your Housekeeping? N    Patient Care Team: Shirline Frees, NP as PCP - General (Family Medicine) Puschinsky, Adelfa Koh., MD (Urology)  Indicate any recent Medical Services you may have received from other than [facility] providers in the past year (date may be approximate).     Assessment:   This is a routine wellness examination for Devine.  Hearing/Vision screen Hearing Screening - Comments:: Denies hearing difficulties   Vision Screening - Comments:: Wears rx glasses - up to date with routine eye exams with  [facility]   Goals Addressed               This Visit's Progress     Increase physical activity (pt-stated)         Depression Screen    12/19/2022    4:46 PM 06/29/2022   10:46 AM 12/05/2021   11:12 AM 11/29/2020   10:03 AM 11/24/2019    9:32 AM 11/11/2019   10:48 AM 07/22/2019    8:53 AM  PHQ 2/9 Scores  PHQ - 2 Score 0 0 0 0 0 0 0  PHQ- 9 Score  0         Fall Risk    12/19/2022    4:50 PM 06/29/2022   10:46 AM 12/05/2021   11:12 AM 11/29/2020   10:03 AM 11/29/2020    9:22 AM  Fall Risk   Falls in the past year? 0 0 0 0 0  Number falls in past yr: 0 0 0  0  Injury with  Fall? 0 0 0  0  Risk for fall due to : No Fall Risks No Fall Risks No Fall Risks Medication side effect   Follow up Falls prevention discussed Falls evaluation completed Falls evaluation completed Falls evaluation completed;Education provided;Falls prevention discussed     MEDICARE RISK AT HOME: Medicare Risk at Home Any stairs in or around the home?: No If so, are there any without handrails?: No Home free of loose throw rugs in walkways, pet beds, electrical cords, etc?: Yes Adequate lighting in your home to reduce risk of falls?: Yes Life alert?: No Use of a cane, walker or w/c?: No Grab bars in the bathroom?: No Shower chair or bench in shower?: No Elevated toilet seat or a handicapped toilet?: No  TIMED UP AND GO:  Was the test performed?  Yes  Length of time to ambulate 10 feet: 10 sec Gait steady and fast without use of assistive device    Cognitive Function:    12/07/2014   12:06 PM  MMSE - Mini Mental State Exam  Not completed: --        12/19/2022    4:51 PM 11/29/2020   10:05 AM 11/24/2019    9:38 AM  6CIT Screen  What Year? 0 points 0 points 0 points  What month? 0 points 0 points 0 points  What time? 0 points 0 points   Count back from 20 0 points 0 points 0 points  Months in reverse 0 points 0 points  0 points  Repeat phrase 0 points 0 points 0 points  Total Score 0 points 0 points     Immunizations Immunization History  Administered Date(s) Administered   Fluad Quad(high Dose 65+) 09/27/2018, 09/28/2019, 10/26/2020   H1N1 12/24/2007   Influenza Inj Mdck Quad Pf 10/26/2020   Influenza Split 10/03/2010, 10/23/2011   Influenza Whole 11/21/2006, 09/25/2007, 09/16/2009   Influenza, High Dose Seasonal PF 10/30/2012, 01/05/2016, 10/01/2016, 10/10/2017   Influenza,inj,Quad PF,6+ Mos 10/30/2013, 09/27/2014   Influenza-Unspecified 09/21/2021   Moderna SARS-COV2 Booster Vaccination 10/01/2021   PFIZER Comirnaty(Gray Top)Covid-19 Tri-Sucrose Vaccine  06/09/2020   PFIZER(Purple Top)SARS-COV-2 Vaccination 01/21/2019, 02/11/2019, 09/30/2019   Pfizer Covid-19 Vaccine Bivalent Booster 24yrs & up 10/26/2020, 10/01/2021   Pneumococcal Conjugate-13 12/07/2014   Pneumococcal Polysaccharide-23 10/02/2007   Td 11/21/2008, 04/02/2022   Zoster Recombinant(Shingrix) 01/25/2022, 04/02/2022    TDAP status: Up to date  Flu Vaccine status: Up to date  Pneumococcal vaccine status: Up to date  Covid-19 vaccine status: Declined, Education has been provided regarding the importance of this vaccine but patient still declined. Advised may receive this vaccine at local pharmacy or Health Dept.or vaccine clinic. Aware to provide a copy of the vaccination record if obtained from local pharmacy or Health Dept. Verbalized acceptance and understanding.  Qualifies for Shingles Vaccine? Yes   Zostavax completed Yes   Shingrix Completed?: Yes  Screening Tests Health Maintenance  Topic Date Due   COVID-19 Vaccine (6 - 2024-25 season) 09/02/2022   Medicare Annual Wellness (AWV)  12/19/2023   DTaP/Tdap/Td (3 - Tdap) 04/01/2032   Pneumonia Vaccine 73+ Years old  Completed   INFLUENZA VACCINE  Completed   Zoster Vaccines- Shingrix  Completed   HPV VACCINES  Aged Out   Hepatitis C Screening  Discontinued   Fecal DNA (Cologuard)  Discontinued    Health Maintenance  Health Maintenance Due  Topic Date Due   COVID-19 Vaccine (6 - 2024-25 season) 09/02/2022    Additional Screening:    Vision Screening: Recommended annual ophthalmology exams for early detection of glaucoma and other disorders of the eye. Is the patient up to date with their annual eye exam?  Yes  Who is the provider or what is the name of the office in which the patient attends annual eye exams? [facility] If pt is not established with a provider, would they like to be referred to a provider to establish care? No .   Dental Screening: Recommended annual dental exams for proper oral  hygiene    Community Resource Referral / Chronic Care Management:  CRR required this visit?  No   CCM required this visit?  No     Plan:     I have personally reviewed and noted the following in the patient's chart:   Medical and social history Use of alcohol, tobacco or illicit drugs  Current medications and supplements including opioid prescriptions. Patient is not currently taking opioid prescriptions. Functional ability and status Nutritional status Physical activity Advanced directives List of other physicians Hospitalizations, surgeries, and ER visits in previous 12 months Vitals Screenings to include cognitive, depression, and falls Referrals and appointments  In addition, I have reviewed and discussed with patient certain preventive protocols, quality metrics, and best practice recommendations. A written personalized care plan for preventive services as well as general preventive health recommendations were provided to patient.     Tillie Rung, LPN   [id number]/29/5621   After Visit Summary: (MyChart) Due to this being a telephonic visit, the after  visit summary with patients personalized plan was offered to patient via MyChart   Nurse Notes: None

## 2023-02-13 ENCOUNTER — Encounter: Payer: Self-pay | Admitting: Adult Health

## 2023-02-13 ENCOUNTER — Ambulatory Visit (INDEPENDENT_AMBULATORY_CARE_PROVIDER_SITE_OTHER): Payer: Medicare Other | Admitting: Adult Health

## 2023-02-13 VITALS — BP 110/60 | HR 99 | Temp 98.2°F | Wt 218.0 lb

## 2023-02-13 DIAGNOSIS — M542 Cervicalgia: Secondary | ICD-10-CM | POA: Diagnosis not present

## 2023-02-13 MED ORDER — CYCLOBENZAPRINE HCL 5 MG PO TABS: 5.0000 mg | ORAL_TABLET | Freq: Three times a day (TID) | ORAL | 1 refills | Status: DC | PRN

## 2023-02-13 NOTE — Progress Notes (Signed)
Subjective:    Patient ID: Todd Larson, male    DOB: May 18, 1940, 83 y.o.   MRN: [id number]  HPI 83 year old male who  has a past medical history of Arthritis, Bulging discs, Diverticulosis of colon, GERD (gastroesophageal reflux disease), Hyperlipidemia, Hypertension, Inguinal hernia, OSA (obstructive sleep apnea), PSA elevation, and Recurrent sinus infections.  He presents to the office today for an acute issue. He reports that for the last week or two he has been experiencing pain in the back of his neck. He has a Sleep Number bed and reports that his bed has a hole in it which has caused him to sleep in a groove and has caused him to have neck and right shoulder pain. This pain seems to be muscular and is not constant.    Review of Systems See HPI   Past Medical History:  Diagnosis Date   Arthritis    cervical spine - T7-8 & lumbar area - bone spurs up & down the back    Bulging discs     thoracic spine   Diverticulosis of colon    GERD (gastroesophageal reflux disease)    related to intestinal system, - has started Align  4-5 days ago-    Hyperlipidemia    Hypertension    Inguinal hernia    OSA (obstructive sleep apnea)    saw Dr. Maple Hudson- 02/2015- pt. remarks that as a result BP med. was changed , pt. has a sleep number bed & he reports the score averages in the 80% range    PSA elevation    History of    Recurrent sinus infections    related to fracture nose x2, also reports that he has a deviated septum    Social History   Socioeconomic History   Marital status: Single    Spouse name: Not on file   Number of children: 0   Years of education: Not on file   Highest education level: Not on file  Occupational History   Occupation: retired  Tobacco Use   Smoking status: Never   Smokeless tobacco: Never  Vaping Use   Vaping status: Never Used  Substance and Sexual Activity   Alcohol use: Yes    Alcohol/week: 8.0 - 9.0 standard drinks of alcohol    Types: 8 - 9  Glasses of wine per week    Comment: social/ drinks wine(white)   Drug use: No   Sexual activity: Not on file  Other Topics Concern   Not on file  Social History Narrative   Retired    Single    No kids       He likes to read, volunteers with an animal rescue group.       Social Drivers of Corporate investment banker Strain: Low Risk  (12/19/2022)   Overall Financial Resource Strain (CARDIA)    Difficulty of Paying Living Expenses: Not hard at all  Food Insecurity: No Food Insecurity (12/19/2022)   Hunger Vital Sign    Worried About Running Out of Food in the Last Year: Never true    Ran Out of Food in the Last Year: Never true  Transportation Needs: No Transportation Needs (12/19/2022)   PRAPARE - Administrator, Civil Service (Medical): No    Lack of Transportation (Non-Medical): No  Physical Activity: Sufficiently Active (12/19/2022)   Exercise Vital Sign    Days of Exercise per Week: 7 days    Minutes of Exercise per Session:  30 min  Stress: No Stress Concern Present (12/19/2022)   [employer] of Occupational Health - Occupational Stress Questionnaire    Feeling of Stress : Not at all  Social Connections: Socially Isolated (12/19/2022)   Social Connection and Isolation Panel [NHANES]    Frequency of Communication with Friends and Family: More than three times a week    Frequency of Social Gatherings with Friends and Family: More than three times a week    Attends Religious Services: Never    Database administrator or Organizations: No    Attends Banker Meetings: Never    Marital Status: Never married  Intimate Partner Violence: Not At Risk (12/19/2022)   Humiliation, Afraid, Rape, and Kick questionnaire    Fear of Current or Ex-Partner: No    Emotionally Abused: No    Physically Abused: No    Sexually Abused: No    Past Surgical History:  Procedure Laterality Date   COLONOSCOPY  2008   3 internal hemorrhoids,  diverticulosis;Dr.Jacobs   INSERTION OF MESH N/A 11/13/2016   Procedure: INSERTION OF MESH;  Surgeon: Axel Filler, MD;  Location: [facility] OR;  Service: General;  Laterality: N/A;   LAPAROSCOPIC INGUINAL HERNIA WITH UMBILICAL HERNIA Left 11/13/2016   Procedure: LAPAROSCOPIC LEFT INGUINAL HERNIA  AND UMBILICAL HERNIA REPAIRS WITH MESH;  Surgeon: Axel Filler, MD;  Location: [facility] OR;  Service: General;  Laterality: Left;   PROSTATE BIOPSY     x 2, once abnormal, once normal   TONSILLECTOMY      Family History  Problem Relation Age of Onset   Lung cancer Maternal Grandfather        enviromental    Heart attack Paternal Uncle    Prostate cancer Brother     Allergies  Allergen Reactions   Amoxicillin-Pot Clavulanate Diarrhea and Other (See Comments)    Night sweats, not allergic to Amoxicillin, just can't tolerate augmentin    Current Outpatient Medications on File Prior to Visit  Medication Sig Dispense Refill   carbamide peroxide (DEBROX) 6.5 % OTIC solution Place 5 drops into both ears daily as needed. 15 mL 0   clotrimazole-betamethasone (LOTRISONE) cream Apply 1 Application topically daily. 30 g 0   finasteride (PROSCAR) 5 MG tablet Take 5 mg by mouth daily.     fluconazole (DIFLUCAN) 150 MG tablet Take one tab today and take the second tab three days later 2 tablet 0   fluticasone (FLONASE) 50 MCG/ACT nasal spray USE 1 SPRAY(S) IN EACH NOSTRIL TWICE DAILY AS NEEDED FOR RUNNY NOSE 16 g 0   hydrochlorothiazide (HYDRODIURIL) 25 MG tablet Take 1 tablet (25 mg total) by mouth daily. 90 tablet 3   losartan (COZAAR) 50 MG tablet Take 1 tablet (50 mg total) by mouth daily. 90 tablet 3   Polyvinyl Alcohol-Povidone (REFRESH OP) Place 1 drop as needed into both eyes (for dry eyes).     No current facility-administered medications on file prior to visit.    BP 110/60   Pulse 99   Temp 98.2 F (36.8 C) (Oral)   Wt 218 lb (98.9 kg)   SpO2 97%   BMI 33.15 kg/m       Objective:    Physical Exam Vitals and nursing note reviewed.  Constitutional:      Appearance: Normal appearance.  Cardiovascular:     Rate and Rhythm: Normal rate and regular rhythm.     Pulses: Normal pulses.     Heart sounds: Normal heart sounds.  Pulmonary:  Effort: Pulmonary effort is normal.     Breath sounds: Normal breath sounds.  Musculoskeletal:        General: Tenderness present. Normal range of motion.  Skin:    General: Skin is warm and dry.  Neurological:     General: No focal deficit present.     Mental Status: He is alert and oriented to person, place, and time.  Psychiatric:        Mood and Affect: Mood normal.        Behavior: Behavior normal.        Thought Content: Thought content normal.        Judgment: Judgment normal.        Assessment & Plan:  1. Neck pain (Primary) - Appears to be muscular in origin. Will send I course of Flexeril to take as needed - cyclobenzaprine (FLEXERIL) 5 MG tablet; Take 1 tablet (5 mg total) by mouth 3 (three) times daily as needed for muscle spasms.  Dispense: 30 tablet; Refill: 1  Shirline Frees, NP

## 2023-03-06 ENCOUNTER — Ambulatory Visit: Admitting: Adult Health

## 2023-03-06 ENCOUNTER — Encounter: Payer: Self-pay | Admitting: Adult Health

## 2023-03-06 ENCOUNTER — Ambulatory Visit

## 2023-03-06 VITALS — BP 120/56 | HR 101 | Temp 98.0°F | Ht 68.0 in | Wt 217.0 lb

## 2023-03-06 DIAGNOSIS — L821 Other seborrheic keratosis: Secondary | ICD-10-CM | POA: Diagnosis not present

## 2023-03-06 DIAGNOSIS — M19011 Primary osteoarthritis, right shoulder: Secondary | ICD-10-CM | POA: Diagnosis not present

## 2023-03-06 DIAGNOSIS — M25511 Pain in right shoulder: Secondary | ICD-10-CM

## 2023-03-06 NOTE — Progress Notes (Signed)
 Subjective:    Patient ID: Todd Larson, male    DOB: 11-11-1940, 83 y.o.   MRN: [id number]  Shoulder Pain    83 year old male who  has a past medical history of Arthritis, Bulging discs, Diverticulosis of colon, GERD (gastroesophageal reflux disease), Hyperlipidemia, Hypertension, Inguinal hernia, OSA (obstructive sleep apnea), PSA elevation, and Recurrent sinus infections.  He presents to the office today for   Right shoulder pain - was seen about 3 weeks ago for right shoulder pain and cervical spine pain after his sleep number bed stopped working. At this time thought that pain was more muscular in origin. He was prescribed flexeril which did not help. He continues to have right shoulder pain especially when he gets up in the morning. As the day goes by his pain improves. Not currently having any issues with ROM   He also has multiple skin neoplasms on his back that he feels has grown in size and are itchy and become irritated by clothing    Review of Systems See  HPI   Past Medical History:  Diagnosis Date   Arthritis    cervical spine - T7-8 & lumbar area - bone spurs up & down the back    Bulging discs     thoracic spine   Diverticulosis of colon    GERD (gastroesophageal reflux disease)    related to intestinal system, - has started Align  4-5 days ago-    Hyperlipidemia    Hypertension    Inguinal hernia    OSA (obstructive sleep apnea)    saw Dr. Maple Hudson- 02/2015- pt. remarks that as a result BP med. was changed , pt. has a sleep number bed & he reports the score averages in the 80% range    PSA elevation    History of    Recurrent sinus infections    related to fracture nose x2, also reports that he has a deviated septum    Social History   Socioeconomic History   Marital status: Single    Spouse name: Not on file   Number of children: 0   Years of education: Not on file   Highest education level: Not on file  Occupational History   Occupation: retired   Tobacco Use   Smoking status: Never   Smokeless tobacco: Never  Vaping Use   Vaping status: Never Used  Substance and Sexual Activity   Alcohol use: Yes    Alcohol/week: 8.0 - 9.0 standard drinks of alcohol    Types: 8 - 9 Glasses of wine per week    Comment: social/ drinks wine(white)   Drug use: No   Sexual activity: Not on file  Other Topics Concern   Not on file  Social History Narrative   Retired    Single    No kids       He likes to read, volunteers with an animal rescue group.       Social Drivers of Corporate investment banker Strain: Low Risk  (12/19/2022)   Overall Financial Resource Strain (CARDIA)    Difficulty of Paying Living Expenses: Not hard at all  Food Insecurity: No Food Insecurity (12/19/2022)   Hunger Vital Sign    Worried About Running Out of Food in the Last Year: Never true    Ran Out of Food in the Last Year: Never true  Transportation Needs: No Transportation Needs (12/19/2022)   PRAPARE - Administrator, Civil Service (Medical):  No    Lack of Transportation (Non-Medical): No  Physical Activity: Sufficiently Active (12/19/2022)   Exercise Vital Sign    Days of Exercise per Week: 7 days    Minutes of Exercise per Session: 30 min  Stress: No Stress Concern Present (12/19/2022)   [employer] of Occupational Health - Occupational Stress Questionnaire    Feeling of Stress : Not at all  Social Connections: Socially Isolated (12/19/2022)   Social Connection and Isolation Panel [NHANES]    Frequency of Communication with Friends and Family: More than three times a week    Frequency of Social Gatherings with Friends and Family: More than three times a week    Attends Religious Services: Never    Database administrator or Organizations: No    Attends Banker Meetings: Never    Marital Status: Never married  Intimate Partner Violence: Not At Risk (12/19/2022)   Humiliation, Afraid, Rape, and Kick questionnaire     Fear of Current or Ex-Partner: No    Emotionally Abused: No    Physically Abused: No    Sexually Abused: No    Past Surgical History:  Procedure Laterality Date   COLONOSCOPY  2008   3 internal hemorrhoids, diverticulosis;Dr.Jacobs   INSERTION OF MESH N/A 11/13/2016   Procedure: INSERTION OF MESH;  Surgeon: Axel Filler, MD;  Location: [facility] OR;  Service: General;  Laterality: N/A;   LAPAROSCOPIC INGUINAL HERNIA WITH UMBILICAL HERNIA Left 11/13/2016   Procedure: LAPAROSCOPIC LEFT INGUINAL HERNIA  AND UMBILICAL HERNIA REPAIRS WITH MESH;  Surgeon: Axel Filler, MD;  Location: [facility] OR;  Service: General;  Laterality: Left;   PROSTATE BIOPSY     x 2, once abnormal, once normal   TONSILLECTOMY      Family History  Problem Relation Age of Onset   Lung cancer Maternal Grandfather        enviromental    Heart attack Paternal Uncle    Prostate cancer Brother     Allergies  Allergen Reactions   Amoxicillin-Pot Clavulanate Diarrhea and Other (See Comments)    Night sweats, not allergic to Amoxicillin, just can't tolerate augmentin    Current Outpatient Medications on File Prior to Visit  Medication Sig Dispense Refill   carbamide peroxide (DEBROX) 6.5 % OTIC solution Place 5 drops into both ears daily as needed. 15 mL 0   clotrimazole-betamethasone (LOTRISONE) cream Apply 1 Application topically daily. 30 g 0   cyclobenzaprine (FLEXERIL) 5 MG tablet Take 1 tablet (5 mg total) by mouth 3 (three) times daily as needed for muscle spasms. 30 tablet 1   finasteride (PROSCAR) 5 MG tablet Take 5 mg by mouth daily.     fluconazole (DIFLUCAN) 150 MG tablet Take one tab today and take the second tab three days later 2 tablet 0   fluticasone (FLONASE) 50 MCG/ACT nasal spray USE 1 SPRAY(S) IN EACH NOSTRIL TWICE DAILY AS NEEDED FOR RUNNY NOSE 16 g 0   hydrochlorothiazide (HYDRODIURIL) 25 MG tablet Take 1 tablet (25 mg total) by mouth daily. 90 tablet 3   losartan (COZAAR) 50 MG tablet Take 1  tablet (50 mg total) by mouth daily. 90 tablet 3   Polyvinyl Alcohol-Povidone (REFRESH OP) Place 1 drop as needed into both eyes (for dry eyes).     No current facility-administered medications on file prior to visit.    BP (!) 120/56   Pulse (!) 101   Temp 98 F (36.7 C) (Oral)   Ht 5\' 8"  (1.727 m)  Wt 217 lb (98.4 kg)   SpO2 97%   BMI 32.99 kg/m       Objective:   Physical Exam Constitutional:      Appearance: Normal appearance.  Musculoskeletal:     Right shoulder: No swelling, deformity, bony tenderness or crepitus. Normal range of motion. Normal strength.     Left shoulder: Normal.  Skin:    General: Skin is warm and dry.          Comments: Six seborrheic keratosis noted on back   Neurological:     General: No focal deficit present.     Mental Status: He is alert and oriented to person, place, and time.  Psychiatric:        Mood and Affect: Mood normal.        Behavior: Behavior normal.        Thought Content: Thought content normal.        Judgment: Judgment normal.           Assessment & Plan:  1. Acute pain of right shoulder (Primary) - Likely arthritic pain. Will do xray today and consider steroid injection vs PT  - DG Shoulder Right; Future  2. Seborrheic keratosis - Symptomatic with itchiness and irritation.verbal consent obtained and using liquid nitrogen three freeze thaw cycles were done to each keratosis. Patient tolerated procedure well. After care instructions reviewed   Shirline Frees, NP

## 2023-03-19 ENCOUNTER — Encounter: Payer: Self-pay | Admitting: Adult Health

## 2023-03-21 NOTE — Telephone Encounter (Signed)
 Spoke to [facility] and they are going to expedite the X-ray. Pt is aware.

## 2023-03-22 ENCOUNTER — Ambulatory Visit (INDEPENDENT_AMBULATORY_CARE_PROVIDER_SITE_OTHER): Admitting: Adult Health

## 2023-03-22 VITALS — BP 120/58 | HR 96 | Temp 98.2°F | Ht 68.0 in | Wt 217.0 lb

## 2023-03-22 DIAGNOSIS — M25511 Pain in right shoulder: Secondary | ICD-10-CM

## 2023-03-22 MED ORDER — METHYLPREDNISOLONE ACETATE 80 MG/ML IJ SUSP
80.0000 mg | Freq: Once | INTRAMUSCULAR | Status: AC
  Administered 2023-03-22: 80 mg via INTRA_ARTICULAR

## 2023-03-22 NOTE — Progress Notes (Signed)
 Subjective:    Patient ID: Todd Larson, male    DOB: Oct 26, 1940, 83 y.o.   MRN: [id number]  HPI 83 year old male who  has a past medical history of Arthritis, Bulging discs, Diverticulosis of colon, GERD (gastroesophageal reflux disease), Hyperlipidemia, Hypertension, Inguinal hernia, OSA (obstructive sleep apnea), PSA elevation, and Recurrent sinus infections.  He presents to the office today for follow up regarding right shoulder pain. He recently had an xray of his right shoulder which showed.  IMPRESSION: 1. Mild glenohumeral and acromioclavicular osteoarthritis. 2. Rotator cuff calcific tendinosis.  He continues to have right shoulder pain, especially when he wakes in the morning.   Review of Systems See HPI   Past Medical History:  Diagnosis Date   Arthritis    cervical spine - T7-8 & lumbar area - bone spurs up & down the back    Bulging discs     thoracic spine   Diverticulosis of colon    GERD (gastroesophageal reflux disease)    related to intestinal system, - has started Align  4-5 days ago-    Hyperlipidemia    Hypertension    Inguinal hernia    OSA (obstructive sleep apnea)    saw Dr. Maple Hudson- 02/2015- pt. remarks that as a result BP med. was changed , pt. has a sleep number bed & he reports the score averages in the 80% range    PSA elevation    History of    Recurrent sinus infections    related to fracture nose x2, also reports that he has a deviated septum    Social History   Socioeconomic History   Marital status: Single    Spouse name: Not on file   Number of children: 0   Years of education: Not on file   Highest education level: Not on file  Occupational History   Occupation: retired  Tobacco Use   Smoking status: Never   Smokeless tobacco: Never  Vaping Use   Vaping status: Never Used  Substance and Sexual Activity   Alcohol use: Yes    Alcohol/week: 8.0 - 9.0 standard drinks of alcohol    Types: 8 - 9 Glasses of wine per week    Comment:  social/ drinks wine(white)   Drug use: No   Sexual activity: Not on file  Other Topics Concern   Not on file  Social History Narrative   Retired    Single    No kids       He likes to read, volunteers with an animal rescue group.       Social Drivers of Corporate investment banker Strain: Low Risk  (12/19/2022)   Overall Financial Resource Strain (CARDIA)    Difficulty of Paying Living Expenses: Not hard at all  Food Insecurity: No Food Insecurity (12/19/2022)   Hunger Vital Sign    Worried About Running Out of Food in the Last Year: Never true    Ran Out of Food in the Last Year: Never true  Transportation Needs: No Transportation Needs (12/19/2022)   PRAPARE - Administrator, Civil Service (Medical): No    Lack of Transportation (Non-Medical): No  Physical Activity: Sufficiently Active (12/19/2022)   Exercise Vital Sign    Days of Exercise per Week: 7 days    Minutes of Exercise per Session: 30 min  Stress: No Stress Concern Present (12/19/2022)   [employer] of Occupational Health - Occupational Stress Questionnaire    Feeling of  Stress : Not at all  Social Connections: Socially Isolated (12/19/2022)   Social Connection and Isolation Panel [NHANES]    Frequency of Communication with Friends and Family: More than three times a week    Frequency of Social Gatherings with Friends and Family: More than three times a week    Attends Religious Services: Never    Database administrator or Organizations: No    Attends Banker Meetings: Never    Marital Status: Never married  Intimate Partner Violence: Not At Risk (12/19/2022)   Humiliation, Afraid, Rape, and Kick questionnaire    Fear of Current or Ex-Partner: No    Emotionally Abused: No    Physically Abused: No    Sexually Abused: No    Past Surgical History:  Procedure Laterality Date   COLONOSCOPY  2008   3 internal hemorrhoids, diverticulosis;Dr.Jacobs   INSERTION OF MESH N/A  11/13/2016   Procedure: INSERTION OF MESH;  Surgeon: Axel Filler, MD;  Location: [facility] OR;  Service: General;  Laterality: N/A;   LAPAROSCOPIC INGUINAL HERNIA WITH UMBILICAL HERNIA Left 11/13/2016   Procedure: LAPAROSCOPIC LEFT INGUINAL HERNIA  AND UMBILICAL HERNIA REPAIRS WITH MESH;  Surgeon: Axel Filler, MD;  Location: [facility] OR;  Service: General;  Laterality: Left;   PROSTATE BIOPSY     x 2, once abnormal, once normal   TONSILLECTOMY      Family History  Problem Relation Age of Onset   Lung cancer Maternal Grandfather        enviromental    Heart attack Paternal Uncle    Prostate cancer Brother     Allergies  Allergen Reactions   Amoxicillin-Pot Clavulanate Diarrhea and Other (See Comments)    Night sweats, not allergic to Amoxicillin, just can't tolerate augmentin    Current Outpatient Medications on File Prior to Visit  Medication Sig Dispense Refill   carbamide peroxide (DEBROX) 6.5 % OTIC solution Place 5 drops into both ears daily as needed. 15 mL 0   clotrimazole-betamethasone (LOTRISONE) cream Apply 1 Application topically daily. 30 g 0   cyclobenzaprine (FLEXERIL) 5 MG tablet Take 1 tablet (5 mg total) by mouth 3 (three) times daily as needed for muscle spasms. 30 tablet 1   finasteride (PROSCAR) 5 MG tablet Take 5 mg by mouth daily.     fluconazole (DIFLUCAN) 150 MG tablet Take one tab today and take the second tab three days later 2 tablet 0   fluticasone (FLONASE) 50 MCG/ACT nasal spray USE 1 SPRAY(S) IN EACH NOSTRIL TWICE DAILY AS NEEDED FOR RUNNY NOSE 16 g 0   hydrochlorothiazide (HYDRODIURIL) 25 MG tablet Take 1 tablet (25 mg total) by mouth daily. 90 tablet 3   losartan (COZAAR) 50 MG tablet Take 1 tablet (50 mg total) by mouth daily. 90 tablet 3   Polyvinyl Alcohol-Povidone (REFRESH OP) Place 1 drop as needed into both eyes (for dry eyes).     No current facility-administered medications on file prior to visit.    BP (!) 120/58   Pulse 96   Temp 98.2 F  (36.8 C) (Oral)   Ht 5\' 8"  (1.727 m)   Wt 217 lb (98.4 kg)   SpO2 96%   BMI 32.99 kg/m       Objective:   Physical Exam Vitals and nursing note reviewed.  Constitutional:      Appearance: Normal appearance. He is obese.  Musculoskeletal:        General: No swelling, tenderness or deformity. Normal range of motion.  Skin:    General: Skin is warm and dry.  Neurological:     General: No focal deficit present.     Mental Status: He is alert and oriented to person, place, and time.  Psychiatric:        Mood and Affect: Mood normal.        Behavior: Behavior normal.        Thought Content: Thought content normal.        Judgment: Judgment normal.        Assessment & Plan:  1. Acute pain of right shoulder (Primary) Shoulder injection Verbal consent obtained and verified. Sterile betadine prep. Furthur cleansed with alcohol. Topical analgesic spray: Ethyl chloride. Joint: right subacromial injection Approached in typical fashion with: posterior approach Completed without difficulty Meds: 3 cc lidocaine 2% no epi, 1 cc depomedrol 80mg /cc Needle:1.5 inch 25 gauge Aftercare instructions and Red flags advised.  - methylPREDNISolone acetate (DEPO-MEDROL) injection 80 mg

## 2023-04-23 ENCOUNTER — Ambulatory Visit
Admission: EM | Admit: 2023-04-23 | Discharge: 2023-04-23 | Disposition: A | Discharge status: Home / Self Care | Attending: Family Medicine | Admitting: Family Medicine

## 2023-04-23 DIAGNOSIS — H6993 Unspecified Eustachian tube disorder, bilateral: Secondary | ICD-10-CM

## 2023-04-23 DIAGNOSIS — J3089 Other allergic rhinitis: Secondary | ICD-10-CM | POA: Diagnosis not present

## 2023-04-23 MED ORDER — PREDNISONE 20 MG PO TABS: ORAL_TABLET | ORAL | 0 refills | Status: DC

## 2023-04-23 MED ORDER — LORATADINE 10 MG PO TABS: 10.0000 mg | ORAL_TABLET | Freq: Every day | ORAL | 0 refills | Status: AC

## 2023-04-23 NOTE — ED Provider Notes (Signed)
 [facility]  Note:  This document was prepared using Conservation officer, historic buildings and may include unintentional dictation errors.  MRN: [id number] DOB: 11-28-1940  Subjective:   Todd Larson is a 83 y.o. male presenting for 1 week history of persistent irritation of the sinus passages.  Patient has longstanding history of allergic rhinitis and is typically done well with steroids.  He was using a Flonase  nasal spray but this caused more irritation.  He switched to nasal saline but still has the irritation.  He is feeling slight irritation of the upper lip as well.  No rashes.  However, today he did experience some transient dizziness, lightheadedness.  No ear pain, ear fullness, fever, cough, chest pain, shortness of breath or wheezing.  He does not take any antihistamines.  No weakness, numbness or tingling.  No headache, confusion.  No current facility-administered medications for this encounter.  Current Outpatient Medications:    carbamide peroxide (DEBROX) 6.5 % OTIC solution, Place 5 drops into both ears daily as needed., Disp: 15 mL, Rfl: 0   clotrimazole -betamethasone  (LOTRISONE ) cream, Apply 1 Application topically daily., Disp: 30 g, Rfl: 0   cyclobenzaprine  (FLEXERIL ) 5 MG tablet, Take 1 tablet (5 mg total) by mouth 3 (three) times daily as needed for muscle spasms., Disp: 30 tablet, Rfl: 1   finasteride (PROSCAR) 5 MG tablet, Take 5 mg by mouth daily., Disp: , Rfl:    fluconazole  (DIFLUCAN ) 150 MG tablet, Take one tab today and take the second tab three days later, Disp: 2 tablet, Rfl: 0   fluticasone  (FLONASE ) 50 MCG/ACT nasal spray, USE 1 SPRAY(S) IN EACH NOSTRIL TWICE DAILY AS NEEDED FOR RUNNY NOSE, Disp: 16 g, Rfl: 0   hydrochlorothiazide  (HYDRODIURIL ) 25 MG tablet, Take 1 tablet (25 mg total) by mouth daily., Disp: 90 tablet, Rfl: 3   losartan  (COZAAR ) 50 MG tablet, Take 1 tablet (50 mg total) by mouth daily., Disp: 90 tablet, Rfl: 3   Polyvinyl  Alcohol-Povidone (REFRESH OP), Place 1 drop as needed into both eyes (for dry eyes)., Disp: , Rfl:    Allergies  Allergen Reactions   Amoxicillin -Pot Clavulanate Diarrhea and Other (See Comments)    Night sweats, not allergic to Amoxicillin , just can't tolerate augmentin    Past Medical History:  Diagnosis Date   Arthritis    cervical spine - T7-8 & lumbar area - bone spurs up & down the back    Bulging discs     thoracic spine   Diverticulosis of colon    GERD (gastroesophageal reflux disease)    related to intestinal system, - has started Align  4-5 days ago-    Hyperlipidemia    Hypertension    Inguinal hernia    OSA (obstructive sleep apnea)    saw Dr. Linder Revere- 02/2015- pt. remarks that as a result BP med. was changed , pt. has a sleep number bed & he reports the score averages in the 80% range    PSA elevation    History of    Recurrent sinus infections    related to fracture nose x2, also reports that he has a deviated septum     Past Surgical History:  Procedure Laterality Date   COLONOSCOPY  2008   3 internal hemorrhoids, diverticulosis;Dr.Jacobs   INSERTION OF MESH N/A 11/13/2016   Procedure: INSERTION OF MESH;  Surgeon: Shela Derby, MD;  Location: [facility] OR;  Service: General;  Laterality: N/A;   LAPAROSCOPIC INGUINAL HERNIA WITH UMBILICAL HERNIA  Left 11/13/2016   Procedure: LAPAROSCOPIC LEFT INGUINAL HERNIA  AND UMBILICAL HERNIA REPAIRS WITH MESH;  Surgeon: Shela Derby, MD;  Location: [facility] OR;  Service: General;  Laterality: Left;   PROSTATE BIOPSY     x 2, once abnormal, once normal   TONSILLECTOMY      Family History  Problem Relation Age of Onset   Lung cancer Maternal Grandfather        enviromental    Heart attack Paternal Uncle    Prostate cancer Brother     Social History   Tobacco Use   Smoking status: Never   Smokeless tobacco: Never  Vaping Use   Vaping status: Never Used  Substance Use Topics   Alcohol use: Yes    Alcohol/week: 8.0 -  9.0 standard drinks of alcohol    Types: 8 - 9 Glasses of wine per week    Comment: social/ drinks wine(white)   Drug use: No    ROS   Objective:   Vitals: BP (!) 149/75 (BP Location: Left Arm)   Pulse 90   Temp 97.9 F (36.6 C) (Oral)   Resp 18   SpO2 94%   Physical Exam Constitutional:      General: He is not in acute distress.    Appearance: Normal appearance. He is well-developed and normal weight. He is not ill-appearing, toxic-appearing or diaphoretic.  HENT:     Head: Normocephalic and atraumatic.     Right Ear: Tympanic membrane, ear canal and external ear normal. No drainage, swelling or tenderness. No middle ear effusion. There is no impacted cerumen. Tympanic membrane is not erythematous or bulging.     Left Ear: Tympanic membrane, ear canal and external ear normal. No drainage, swelling or tenderness.  No middle ear effusion. There is no impacted cerumen. Tympanic membrane is not erythematous or bulging.     Nose: Nose normal. No congestion or rhinorrhea.     Mouth/Throat:     Mouth: Mucous membranes are moist.     Pharynx: No oropharyngeal exudate or posterior oropharyngeal erythema.     Comments: Oral mucosa without any particular lesion, tenderness. Eyes:     General: No scleral icterus.       Right eye: No discharge.        Left eye: No discharge.     Extraocular Movements: Extraocular movements intact.     Conjunctiva/sclera: Conjunctivae normal.  Cardiovascular:     Rate and Rhythm: Normal rate and regular rhythm.     Heart sounds: Normal heart sounds. No murmur heard.    No friction rub. No gallop.  Pulmonary:     Effort: Pulmonary effort is normal. No respiratory distress.     Breath sounds: Normal breath sounds. No stridor. No wheezing, rhonchi or rales.  Musculoskeletal:     Cervical back: Normal range of motion and neck supple. No rigidity. No muscular tenderness.  Skin:    General: Skin is warm and dry.  Neurological:     General: No focal  deficit present.     Mental Status: He is alert and oriented to person, place, and time.     Cranial Nerves: No cranial nerve deficit.     Motor: No weakness.     Coordination: Coordination normal.     Gait: Gait normal.  Psychiatric:        Mood and Affect: Mood normal.        Behavior: Behavior normal.        Thought Content: Thought content  normal.     Assessment and Plan :   PDMP not reviewed this encounter.  1. Allergic rhinitis due to other allergic trigger, unspecified seasonality   2. Eustachian tube dysfunction, bilateral    Recommended a oral prednisone  course for an allergic rhinitis, eustachian tube dysfunction.  Start an antihistamine daily.  Continue with nasal saline, avoid nasal steroids.  Counseled patient on potential for adverse effects with medications prescribed/recommended today, ER and return-to-clinic precautions discussed, patient verbalized understanding.    Adolph Hoop, [place] 04/23/23 1342

## 2023-04-23 NOTE — ED Triage Notes (Signed)
 Pt c/o irritated nasal passages x 1 week,was using a nasal spray,upper lip irritation x 2days,dizziness since this morning.

## 2023-04-29 DIAGNOSIS — N138 Other obstructive and reflux uropathy: Secondary | ICD-10-CM | POA: Diagnosis not present

## 2023-04-29 DIAGNOSIS — N401 Enlarged prostate with lower urinary tract symptoms: Secondary | ICD-10-CM | POA: Diagnosis not present

## 2023-04-29 DIAGNOSIS — R3589 Other polyuria: Secondary | ICD-10-CM | POA: Diagnosis not present

## 2023-05-21 ENCOUNTER — Ambulatory Visit (INDEPENDENT_AMBULATORY_CARE_PROVIDER_SITE_OTHER): Admitting: Adult Health

## 2023-05-21 VITALS — BP 120/60 | HR 81 | Temp 98.7°F | Wt 217.0 lb

## 2023-05-21 DIAGNOSIS — S39012A Strain of muscle, fascia and tendon of lower back, initial encounter: Secondary | ICD-10-CM | POA: Diagnosis not present

## 2023-05-21 DIAGNOSIS — R42 Dizziness and giddiness: Secondary | ICD-10-CM | POA: Diagnosis not present

## 2023-05-21 MED ORDER — ADULT BLOOD PRESSURE CUFF LG KIT: PACK | 0 refills | Status: AC

## 2023-05-21 NOTE — Progress Notes (Signed)
 Subjective:    Patient ID: Todd Larson, male    DOB: [birth year], 83 y.o.   MRN: [id number]  HPI 83 year old male who  has a past medical history of Arthritis, Bulging discs, Diverticulosis of colon, GERD (gastroesophageal reflux disease), Hyperlipidemia, Hypertension, Inguinal hernia, OSA (obstructive sleep apnea), PSA elevation, and Recurrent sinus infections.  He presents to the office today for acute low back pain.  He reports that he has had back spasms for the last week, spasms are worse in the morning and when changing positions from sitting or laying to standing.  Denies trauma or aggravating injury.  He continues to walk 30 minutes a day.  He just recently started using Flexeril .  Feels as though his symptoms have improved slightly over the last few days  He also reports that twice over the last 3 months he has been in the shower and has felt lightheaded and dizzy.  Both times he went to urgent care and they thought it was due to eustachian tube function.  He does not check his blood pressure at home the blood pressures have always been well-controlled.  He does try and stay hydrated throughout the day.  He has not had any symptoms for over a month.  He has not had any symptoms outside of being in the shower.  His symptoms will last a day or 2 and then resolved   Review of Systems See HPI   Past Medical History:  Diagnosis Date   Arthritis    cervical spine - T7-8 & lumbar area - bone spurs up & down the back    Bulging discs     thoracic spine   Diverticulosis of colon    GERD (gastroesophageal reflux disease)    related to intestinal system, - has started Align  4-5 days ago-    Hyperlipidemia    Hypertension    Inguinal hernia    OSA (obstructive sleep apnea)    saw Dr. Linder Revere- 02/2015- pt. remarks that as a result BP med. was changed , pt. has a sleep number bed & he reports the score averages in the 80% range    PSA elevation    History of    Recurrent sinus infections     related to fracture nose x2, also reports that he has a deviated septum    Social History   Socioeconomic History   Marital status: Single    Spouse name: Not on file   Number of children: 0   Years of education: Not on file   Highest education level: Not on file  Occupational History   Occupation: retired  Tobacco Use   Smoking status: Never   Smokeless tobacco: Never  Vaping Use   Vaping status: Never Used  Substance and Sexual Activity   Alcohol use: Yes    Alcohol/week: 8.0 - 9.0 standard drinks of alcohol    Types: 8 - 9 Glasses of wine per week    Comment: social/ drinks wine(white)   Drug use: No   Sexual activity: Not on file  Other Topics Concern   Not on file  Social History Narrative   Retired    Single    No kids       He likes to read, volunteers with an animal rescue group.       Social Drivers of Health   Financial Resource Strain: Low Risk  (12/19/2022)   Overall Financial Resource Strain (CARDIA)    Difficulty of Paying  Living Expenses: Not hard at all  Food Insecurity: No Food Insecurity (12/19/2022)   Hunger Vital Sign    Worried About Running Out of Food in the Last Year: Never true    Ran Out of Food in the Last Year: Never true  Transportation Needs: No Transportation Needs (12/19/2022)   PRAPARE - Administrator, Civil Service (Medical): No    Lack of Transportation (Non-Medical): No  Physical Activity: Sufficiently Active (12/19/2022)   Exercise Vital Sign    Days of Exercise per Week: 7 days    Minutes of Exercise per Session: 30 min  Stress: No Stress Concern Present (12/19/2022)   [employer] of Occupational Health - Occupational Stress Questionnaire    Feeling of Stress : Not at all  Social Connections: Socially Isolated (12/19/2022)   Social Connection and Isolation Panel [NHANES]    Frequency of Communication with Friends and Family: More than three times a week    Frequency of Social Gatherings with Friends and  Family: More than three times a week    Attends Religious Services: Never    Database administrator or Organizations: No    Attends Banker Meetings: Never    Marital Status: Never married  Intimate Partner Violence: Not At Risk (12/19/2022)   Humiliation, Afraid, Rape, and Kick questionnaire    Fear of Current or Ex-Partner: No    Emotionally Abused: No    Physically Abused: No    Sexually Abused: No    Past Surgical History:  Procedure Laterality Date   COLONOSCOPY  2008   3 internal hemorrhoids, diverticulosis;Dr.Jacobs   INSERTION OF MESH N/A 11/13/2016   Procedure: INSERTION OF MESH;  Surgeon: Shela Derby, MD;  Location: [facility] OR;  Service: General;  Laterality: N/A;   LAPAROSCOPIC INGUINAL HERNIA WITH UMBILICAL HERNIA Left 11/13/2016   Procedure: LAPAROSCOPIC LEFT INGUINAL HERNIA  AND UMBILICAL HERNIA REPAIRS WITH MESH;  Surgeon: Shela Derby, MD;  Location: [facility] OR;  Service: General;  Laterality: Left;   PROSTATE BIOPSY     x 2, once abnormal, once normal   TONSILLECTOMY      Family History  Problem Relation Age of Onset   Lung cancer Maternal Grandfather        enviromental    Heart attack Paternal Uncle    Prostate cancer Brother     Allergies  Allergen Reactions   Amoxicillin -Pot Clavulanate Diarrhea and Other (See Comments)    Night sweats, not allergic to Amoxicillin , just can't tolerate augmentin    Current Outpatient Medications on File Prior to Visit  Medication Sig Dispense Refill   carbamide peroxide (DEBROX) 6.5 % OTIC solution Place 5 drops into both ears daily as needed. 15 mL 0   clotrimazole -betamethasone  (LOTRISONE ) cream Apply 1 Application topically daily. 30 g 0   cyclobenzaprine  (FLEXERIL ) 5 MG tablet Take 1 tablet (5 mg total) by mouth 3 (three) times daily as needed for muscle spasms. 30 tablet 1   finasteride (PROSCAR) 5 MG tablet Take 5 mg by mouth daily.     fluconazole  (DIFLUCAN ) 150 MG tablet Take one tab today and take  the second tab three days later 2 tablet 0   fluticasone  (FLONASE ) 50 MCG/ACT nasal spray USE 1 SPRAY(S) IN EACH NOSTRIL TWICE DAILY AS NEEDED FOR RUNNY NOSE 16 g 0   hydrochlorothiazide  (HYDRODIURIL ) 25 MG tablet Take 1 tablet (25 mg total) by mouth daily. 90 tablet 3   loratadine  (CLARITIN ) 10 MG tablet Take 1 tablet (  10 mg total) by mouth daily. 30 tablet 0   losartan  (COZAAR ) 50 MG tablet Take 1 tablet (50 mg total) by mouth daily. 90 tablet 3   Polyvinyl Alcohol-Povidone (REFRESH OP) Place 1 drop as needed into both eyes (for dry eyes).     predniSONE  (DELTASONE ) 20 MG tablet Take 2 tablets daily with breakfast. 10 tablet 0   No current facility-administered medications on file prior to visit.    BP 120/60   Pulse 81   Temp 98.7 F (37.1 C)   Wt 217 lb (98.4 kg)   SpO2 97%   BMI 32.99 kg/m       Objective:   Physical Exam Vitals and nursing note reviewed.  Constitutional:      Appearance: Normal appearance.  Cardiovascular:     Rate and Rhythm: Normal rate and regular rhythm.     Pulses: Normal pulses.     Heart sounds: Normal heart sounds.  Pulmonary:     Effort: Pulmonary effort is normal.     Breath sounds: Normal breath sounds.  Musculoskeletal:     Lumbar back: Tenderness present. No swelling, spasms or bony tenderness. Decreased range of motion.  Skin:    General: Skin is dry.  Neurological:     General: No focal deficit present.     Mental Status: He is alert and oriented to person, place, and time.  Psychiatric:        Mood and Affect: Mood normal.        Behavior: Behavior normal.        Thought Content: Thought content normal.        Judgment: Judgment normal.        Assessment & Plan:  1. Strain of lumbar region, initial encounter (Primary) - He was encouraged to use a heating pad in the evening and continue with Flexeril  for the next few days.  If symptoms do not improve then can consider referral to physical therapy  2. Episodic  lightheadedness - Sounds like a vasodilation causing temporary decrease in blood pressure.  Will send in a blood pressure kit for him to check blood pressure at home if symptoms return. - Blood Pressure Monitoring (ADULT BLOOD PRESSURE CUFF LG) KIT; To monitor Blood pressure cuff  Dispense: 1 kit; Refill: 0   Alto Atta, NP

## 2023-08-13 ENCOUNTER — Ambulatory Visit: Payer: Self-pay | Admitting: Adult Health

## 2023-08-13 ENCOUNTER — Ambulatory Visit: Admitting: Adult Health

## 2023-08-13 ENCOUNTER — Encounter: Payer: Self-pay | Admitting: Adult Health

## 2023-08-13 VITALS — BP 130/60 | HR 89 | Temp 98.5°F | Ht 68.0 in | Wt 215.0 lb

## 2023-08-13 DIAGNOSIS — E559 Vitamin D deficiency, unspecified: Secondary | ICD-10-CM | POA: Diagnosis not present

## 2023-08-13 DIAGNOSIS — R7303 Prediabetes: Secondary | ICD-10-CM

## 2023-08-13 DIAGNOSIS — E782 Mixed hyperlipidemia: Secondary | ICD-10-CM | POA: Diagnosis not present

## 2023-08-13 DIAGNOSIS — N400 Enlarged prostate without lower urinary tract symptoms: Secondary | ICD-10-CM | POA: Diagnosis not present

## 2023-08-13 DIAGNOSIS — I10 Essential (primary) hypertension: Secondary | ICD-10-CM

## 2023-08-13 LAB — LIPID PANEL
Cholesterol: 134 mg/dL (ref 0–200)
HDL: 51.2 mg/dL (ref >39.00)
LDL Cholesterol: 70 mg/dL (ref 0–99)
NonHDL: 82.79
Total CHOL/HDL Ratio: 3
Triglycerides: 64 mg/dL (ref 0.0–149.0)
VLDL: 12.8 mg/dL (ref 0.0–40.0)

## 2023-08-13 LAB — TSH: TSH: 2.1 u[IU]/mL (ref 0.35–5.50)

## 2023-08-13 LAB — COMPREHENSIVE METABOLIC PANEL WITH GFR
ALT: 22 U/L (ref 0–53)
AST: 18 U/L (ref 0–37)
Albumin: 4.5 g/dL (ref 3.5–5.2)
Alkaline Phosphatase: 46 U/L (ref 39–117)
BUN: 13 mg/dL (ref 6–23)
CO2: 30 meq/L (ref 19–32)
Calcium: 9.4 mg/dL (ref 8.4–10.5)
Chloride: 100 meq/L (ref 96–112)
Creatinine, Ser: 0.8 mg/dL (ref 0.40–1.50)
GFR: 83.25 mL/min (ref >60.00)
Glucose, Bld: 115 mg/dL — ABNORMAL HIGH (ref 70–99)
Potassium: 4.8 meq/L (ref 3.5–5.1)
Sodium: 137 meq/L (ref 135–145)
Total Bilirubin: 1.8 mg/dL — ABNORMAL HIGH (ref 0.2–1.2)
Total Protein: 6.6 g/dL (ref 6.0–8.3)

## 2023-08-13 LAB — HEMOGLOBIN A1C: Hgb A1c MFr Bld: 5.3 % (ref 4.6–6.5)

## 2023-08-13 LAB — CBC
HCT: 45.5 % (ref 39.0–52.0)
Hemoglobin: 15.7 g/dL (ref 13.0–17.0)
MCHC: 34.5 g/dL (ref 30.0–36.0)
MCV: 96.1 fl (ref 78.0–100.0)
Platelets: 320 K/uL (ref 150.0–400.0)
RBC: 4.74 Mil/uL (ref 4.22–5.81)
RDW: 13.7 % (ref 11.5–15.5)
WBC: 8.2 K/uL (ref 4.0–10.5)

## 2023-08-13 LAB — VITAMIN D 25 HYDROXY (VIT D DEFICIENCY, FRACTURES): VITD: 46.33 ng/mL (ref 30.00–100.00)

## 2023-08-13 MED ORDER — LOSARTAN POTASSIUM 50 MG PO TABS: 50.0000 mg | ORAL_TABLET | Freq: Every day | ORAL | 3 refills | Status: AC

## 2023-08-13 MED ORDER — HYDROCHLOROTHIAZIDE 25 MG PO TABS: 25.0000 mg | ORAL_TABLET | Freq: Every day | ORAL | 3 refills | Status: DC

## 2023-08-13 NOTE — Patient Instructions (Signed)
 It was great seeing you today   We will follow up with you regarding your lab work   Please let me know if you need anything

## 2023-08-13 NOTE — Progress Notes (Signed)
 Subjective:    Patient ID: Todd Larson, male    DOB: 08/02/1940, 83 y.o.   MRN: [id number]  HPI Patient presents for yearly preventative medicine examination. He is a pleasant 83 year old male who  has a past medical history of Arthritis, Bulging discs, Diverticulosis of colon, GERD (gastroesophageal reflux disease), Hyperlipidemia, Hypertension, Inguinal hernia, OSA (obstructive sleep apnea), PSA elevation, and Recurrent sinus infections.   Hypertension-managed with losartan  50 mg daily and HCTZ 25 mg daily.  He denies dizziness, lightheadedness, chest pain, or shortness of breath BP Readings from Last 3 Encounters:  08/13/23 130/60  05/21/23 120/60  04/23/23 (!) 149/75   Hyperlipidemia-diet controlled.  Not currently on any medication Lab Results  Component Value Date   CHOL 144 08/08/2022   HDL 48.80 08/08/2022   LDLCALC 83 08/08/2022   TRIG 63.0 08/08/2022   CHOLHDL 3 08/08/2022   Glucose intolerance-managed with diet. Lab Results  Component Value Date   HGBA1C 5.0 08/08/2022   HGBA1C 5.4 08/04/2021   HGBA1C 5.3 07/28/2020    BPH-takes Proscar 5 mg daily, this is managed by urology.  He does feel well controlled  Vitamin D  deficiency - he does take 2000 units Vitamin D  twice a week plus a multiple vitamin  Last vitamin D  Lab Results  Component Value Date   VD25OH 39.41 08/08/2022    All immunizations and health maintenance protocols were reviewed with the patient and needed orders were placed.  Appropriate screening laboratory values were ordered for the patient including screening of hyperlipidemia, renal function and hepatic function. If indicated by BPH, a PSA was ordered.  Medication reconciliation,  past medical history, social history, problem list and allergies were reviewed in detail with the patient  Goals were established with regard to weight loss, exercise, and  diet in compliance with medications. He is walking more days then not.Todd Larson He tries to eat  healthy  Wt Readings from Last 3 Encounters:  08/13/23 215 lb (97.5 kg)  05/21/23 217 lb (98.4 kg)  03/22/23 217 lb (98.4 kg)    Review of Systems  Constitutional: Negative.   HENT: Negative.    Eyes: Negative.   Respiratory: Negative.    Cardiovascular: Negative.   Gastrointestinal: Negative.   Endocrine: Negative.   Genitourinary: Negative.   Musculoskeletal:  Positive for arthralgias.  Skin: Negative.   Allergic/Immunologic: Negative.   Neurological: Negative.   Hematological: Negative.   Psychiatric/Behavioral: Negative.    All other systems reviewed and are negative.  Past Medical History:  Diagnosis Date   Arthritis    cervical spine - T7-8 & lumbar area - bone spurs up & down the back    Bulging discs     thoracic spine   Diverticulosis of colon    GERD (gastroesophageal reflux disease)    related to intestinal system, - has started Align  4-5 days ago-    Hyperlipidemia    Hypertension    Inguinal hernia    OSA (obstructive sleep apnea)    saw Todd Larson- 02/2015- pt. remarks that as a result BP med. was changed , pt. has a sleep number bed & he reports the score averages in the 80% range    PSA elevation    History of    Recurrent sinus infections    related to fracture nose x2, also reports that he has a deviated septum    Social History   Socioeconomic History   Marital status: Single    Spouse name: Not  on file   Number of children: 0   Years of education: Not on file   Highest education level: Not on file  Occupational History   Occupation: retired  Tobacco Use   Smoking status: Never   Smokeless tobacco: Never  Vaping Use   Vaping status: Never Used  Substance and Sexual Activity   Alcohol use: Yes    Alcohol/week: 8.0 - 9.0 standard drinks of alcohol    Types: 8 - 9 Glasses of wine per week    Comment: social/ drinks wine(white)   Drug use: No   Sexual activity: Not on file  Other Topics Concern   Not on file  Social History Narrative    Retired    Single    No kids       He likes to read, volunteers with an animal rescue group.       Social Drivers of Corporate investment banker Strain: Low Risk  (12/19/2022)   Overall Financial Resource Strain (CARDIA)    Difficulty of Paying Living Expenses: Not hard at all  Food Insecurity: No Food Insecurity (12/19/2022)   Hunger Vital Sign    Worried About Running Out of Food in the Last Year: Never true    Ran Out of Food in the Last Year: Never true  Transportation Needs: No Transportation Needs (12/19/2022)   PRAPARE - Administrator, Civil Service (Medical): No    Lack of Transportation (Non-Medical): No  Physical Activity: Sufficiently Active (12/19/2022)   Exercise Vital Sign    Days of Exercise per Week: 7 days    Minutes of Exercise per Session: 30 min  Stress: No Stress Concern Present (12/19/2022)   Todd Larson of Occupational Health - Occupational Stress Questionnaire    Feeling of Stress : Not at all  Social Connections: Socially Isolated (12/19/2022)   Social Connection and Isolation Panel    Frequency of Communication with Friends and Family: More than three times a week    Frequency of Social Gatherings with Friends and Family: More than three times a week    Attends Religious Services: Never    Database administrator or Organizations: No    Attends Banker Meetings: Never    Marital Status: Never married  Intimate Partner Violence: Not At Risk (12/19/2022)   Humiliation, Afraid, Rape, and Kick questionnaire    Fear of Current or Ex-Partner: No    Emotionally Abused: No    Physically Abused: No    Sexually Abused: No    Past Surgical History:  Procedure Laterality Date   COLONOSCOPY  2008   3 internal hemorrhoids, diverticulosis;ToddJacobs   INSERTION OF MESH N/A 11/13/2016   Procedure: INSERTION OF MESH;  Surgeon: Todd Calamity, MD;  Location: [facility] OR;  Service: General;  Laterality: N/A;   LAPAROSCOPIC INGUINAL  HERNIA WITH UMBILICAL HERNIA Left 11/13/2016   Procedure: LAPAROSCOPIC LEFT INGUINAL HERNIA  AND UMBILICAL HERNIA REPAIRS WITH MESH;  Surgeon: Todd Calamity, MD;  Location: [facility] OR;  Service: General;  Laterality: Left;   PROSTATE BIOPSY     x 2, once abnormal, once normal   TONSILLECTOMY      Family History  Problem Relation Age of Onset   Lung cancer Maternal Grandfather        enviromental    Heart attack Paternal Uncle    Prostate cancer Brother     Allergies  Allergen Reactions   Amoxicillin -Pot Clavulanate Diarrhea and Other (See Comments)  Night sweats, not allergic to Amoxicillin , just can't tolerate augmentin    Current Outpatient Medications on File Prior to Visit  Medication Sig Dispense Refill   carbamide peroxide (DEBROX) 6.5 % OTIC solution Place 5 drops into both ears daily as needed. 15 mL 0   finasteride (PROSCAR) 5 MG tablet Take 5 mg by mouth daily.     fluticasone  (FLONASE ) 50 MCG/ACT nasal spray USE 1 SPRAY(S) IN EACH NOSTRIL TWICE DAILY AS NEEDED FOR RUNNY NOSE 16 g 0   hydrochlorothiazide  (HYDRODIURIL ) 25 MG tablet Take 1 tablet (25 mg total) by mouth daily. 90 tablet 3   loratadine  (CLARITIN ) 10 MG tablet Take 1 tablet (10 mg total) by mouth daily. 30 tablet 0   losartan  (COZAAR ) 50 MG tablet Take 1 tablet (50 mg total) by mouth daily. 90 tablet 3   Polyvinyl Alcohol-Povidone (REFRESH OP) Place 1 drop as needed into both eyes (for dry eyes).     Blood Pressure Monitoring (ADULT BLOOD PRESSURE CUFF LG) KIT To monitor Blood pressure cuff 1 kit 0   clotrimazole -betamethasone  (LOTRISONE ) cream Apply 1 Application topically daily. 30 g 0   cyclobenzaprine  (FLEXERIL ) 5 MG tablet Take 1 tablet (5 mg total) by mouth 3 (three) times daily as needed for muscle spasms. 30 tablet 1   fluconazole  (DIFLUCAN ) 150 MG tablet Take one tab today and take the second tab three days later 2 tablet 0   predniSONE  (DELTASONE ) 20 MG tablet Take 2 tablets daily with breakfast. 10  tablet 0   No current facility-administered medications on file prior to visit.    BP 130/60   Pulse 89   Temp 98.5 F (36.9 C) (Oral)   Ht 5' 8 (1.727 m)   Wt 215 lb (97.5 kg)   SpO2 97%   BMI 32.69 kg/m       Objective:   Physical Exam Vitals and nursing note reviewed.  Constitutional:      General: He is not in acute distress.    Appearance: Normal appearance. He is obese. He is not ill-appearing.  HENT:     Head: Normocephalic and atraumatic.     Right Ear: Tympanic membrane, ear canal and external ear normal. There is no impacted cerumen.     Left Ear: Tympanic membrane, ear canal and external ear normal. There is no impacted cerumen.     Nose: Nose normal. No congestion or rhinorrhea.     Mouth/Throat:     Mouth: Mucous membranes are moist.     Pharynx: Oropharynx is clear.  Eyes:     Extraocular Movements: Extraocular movements intact.     Conjunctiva/sclera: Conjunctivae normal.     Pupils: Pupils are equal, round, and reactive to light.  Neck:     Vascular: No carotid bruit.  Cardiovascular:     Rate and Rhythm: Normal rate and regular rhythm.     Pulses: Normal pulses.     Heart sounds: No murmur heard.    No friction rub. No gallop.  Pulmonary:     Effort: Pulmonary effort is normal.     Breath sounds: Normal breath sounds.  Abdominal:     General: Abdomen is flat. Bowel sounds are normal. There is no distension.     Palpations: Abdomen is soft. There is no mass.     Tenderness: There is no abdominal tenderness. There is no guarding or rebound.     Hernia: No hernia is present.  Musculoskeletal:        General: Normal  range of motion.     Cervical back: Normal range of motion and neck supple.  Lymphadenopathy:     Cervical: No cervical adenopathy.  Skin:    General: Skin is warm and dry.     Capillary Refill: Capillary refill takes less than 2 seconds.  Neurological:     General: No focal deficit present.     Mental Status: He is alert and  oriented to person, place, and time.  Psychiatric:        Mood and Affect: Mood normal.        Behavior: Behavior normal.        Thought Content: Thought content normal.        Judgment: Judgment normal.       Assessment & Plan:  1. Essential hypertension (Primary) - Well controlled. No change in medications  - Lipid panel; Future - TSH; Future - CBC; Future - Comprehensive metabolic panel with GFR; Future - Hemoglobin A1c; Future - hydrochlorothiazide  (HYDRODIURIL ) 25 MG tablet; Take 1 tablet (25 mg total) by mouth daily.  Dispense: 90 tablet; Refill: 3 - losartan  (COZAAR ) 50 MG tablet; Take 1 tablet (50 mg total) by mouth daily.  Dispense: 90 tablet; Refill: 3  2. Mixed hyperlipidemia - Consider statin  - Lipid panel; Future - TSH; Future - CBC; Future - Comprehensive metabolic panel with GFR; Future - Hemoglobin A1c; Future  3. Pre-diabetes - Consider metformin  - Lipid panel; Future - TSH; Future - CBC; Future - Comprehensive metabolic panel with GFR; Future - Hemoglobin A1c; Future  4. Benign prostatic hyperplasia without lower urinary tract symptoms - Per urology   5. Vitamin D  deficiency  - VITAMIN D  25 Hydroxy (Vit-D Deficiency, Fractures); Future  Darleene Shape, NP

## 2023-08-27 ENCOUNTER — Encounter: Payer: Self-pay | Admitting: Family Medicine

## 2023-08-27 ENCOUNTER — Ambulatory Visit (INDEPENDENT_AMBULATORY_CARE_PROVIDER_SITE_OTHER): Admitting: Family Medicine

## 2023-08-27 VITALS — BP 124/62 | HR 82 | Temp 97.6°F | Wt 216.0 lb

## 2023-08-27 DIAGNOSIS — I10 Essential (primary) hypertension: Secondary | ICD-10-CM

## 2023-08-27 DIAGNOSIS — M79604 Pain in right leg: Secondary | ICD-10-CM

## 2023-08-27 DIAGNOSIS — M79605 Pain in left leg: Secondary | ICD-10-CM | POA: Diagnosis not present

## 2023-08-27 NOTE — Progress Notes (Signed)
   Subjective:    Patient ID: Todd Larson, male    DOB: September 25, 1940, 83 y.o.   MRN: [id number]  HPI Here for several issues. First he has been dealing with bilateral lower leg pains for the past year. Normally he likes to walk a mile a day, but lately he cannot do this because both lower legs begin to ache. He evan gets pain in the legs at rest when he is sitting or lying down. He has discussed this with Darleene, his PCP, and he had extensive labs drawn a few weeks ago that were normal. He has had an ABI evaluation on the legs which was normal. There is no swelling. The other issus is his BP. He has been on hydrochlorothiazide  and Losartan  for several years, and his BP tends to be on the low side (110-120 systolic). He often has spells where he feels lightheaded, especially when he stands up.    Review of Systems  Constitutional: Negative.   Respiratory: Negative.    Cardiovascular: Negative.   Musculoskeletal:  Positive for myalgias.  Neurological:  Positive for light-headedness. Negative for dizziness.       Objective:   Physical Exam Constitutional:      Appearance: Normal appearance.  Cardiovascular:     Rate and Rhythm: Normal rate and regular rhythm.     Pulses: Normal pulses.     Heart sounds: Normal heart sounds.  Pulmonary:     Effort: Pulmonary effort is normal.     Breath sounds: Normal breath sounds.  Musculoskeletal:     Comments: Both lower legs are normal, no swelling or tenderness   Neurological:     Mental Status: He is alert.           Assessment & Plan:  The etiology of the lower leg pains is not clear, so we will refer him to Sports Medicine to evaluate this. His HTN seems to be overtreated, so we will stop the hydrochlorothiazide . He will report back in a few weeks about how he feels and what the BP is doing.  Garnette Olmsted, MD

## 2023-08-31 ENCOUNTER — Ambulatory Visit
Admission: EM | Admit: 2023-08-31 | Discharge: 2023-08-31 | Disposition: A | Discharge status: Home / Self Care | Attending: Family Medicine | Admitting: Family Medicine

## 2023-08-31 DIAGNOSIS — I10 Essential (primary) hypertension: Secondary | ICD-10-CM | POA: Diagnosis not present

## 2023-08-31 MED ORDER — HYDROCHLOROTHIAZIDE 12.5 MG PO TABS
12.5000 mg | ORAL_TABLET | Freq: Every day | ORAL | 0 refills | Status: DC
Start: 2023-08-31 — End: 2023-09-25

## 2023-08-31 NOTE — ED Triage Notes (Signed)
 Pt requesting BP check-states he was seen 8/26 by PCP and was taken off hydrochlorothiazide  and home BP have been eleavetd-states he did not get a call back from office yesterday with concerns-NAD-steady gait

## 2023-08-31 NOTE — ED Provider Notes (Signed)
 [facility]  Note:  This document was prepared using Conservation officer, historic buildings and may include unintentional dictation errors.  MRN: [id number] DOB: 1940-08-12  Subjective:   Todd Larson is a 83 y.o. male presenting for blood pressure recheck.  Patient was recently discontinued off his 25 mg hydrochlorothiazide  out of concern for some dizziness and low blood pressure readings.  Since then, patient has been checking his blood pressure at home and has been elevated as high as the 160s.  He has not checked back with his PCP but wanted to be evaluated in the clinic.  No headache, chest pain, confusion, weakness, numbness or tingling.  No current facility-administered medications for this encounter.  Current Outpatient Medications:    Blood Pressure Monitoring (ADULT BLOOD PRESSURE CUFF LG) KIT, To monitor Blood pressure cuff, Disp: 1 kit, Rfl: 0   carbamide peroxide (DEBROX) 6.5 % OTIC solution, Place 5 drops into both ears daily as needed., Disp: 15 mL, Rfl: 0   Cholecalciferol (VITAMIN D -3 PO), Take 2,000 Units by mouth daily at 12 noon., Disp: , Rfl:    finasteride (PROSCAR) 5 MG tablet, Take 5 mg by mouth daily., Disp: , Rfl:    fluticasone  (FLONASE ) 50 MCG/ACT nasal spray, USE 1 SPRAY(S) IN EACH NOSTRIL TWICE DAILY AS NEEDED FOR RUNNY NOSE, Disp: 16 g, Rfl: 0   loratadine  (CLARITIN ) 10 MG tablet, Take 1 tablet (10 mg total) by mouth daily., Disp: 30 tablet, Rfl: 0   losartan  (COZAAR ) 50 MG tablet, Take 1 tablet (50 mg total) by mouth daily., Disp: 90 tablet, Rfl: 3   Multiple Vitamins-Minerals (ONE A DAY MEN 50 PLUS PO), Take by mouth daily., Disp: , Rfl:    Polyvinyl Alcohol-Povidone (REFRESH OP), Place 1 drop as needed into both eyes (for dry eyes)., Disp: , Rfl:    Allergies  Allergen Reactions   Amoxicillin -Pot Clavulanate Diarrhea and Other (See Comments)    Night sweats, not allergic to Amoxicillin , just can't tolerate augmentin    Past Medical  History:  Diagnosis Date   Arthritis    cervical spine - T7-8 & lumbar area - bone spurs up & down the back    Bulging discs     thoracic spine   Diverticulosis of colon    GERD (gastroesophageal reflux disease)    related to intestinal system, - has started Align  4-5 days ago-    Hyperlipidemia    Hypertension    Inguinal hernia    OSA (obstructive sleep apnea)    saw Dr. Neysa- 02/2015- pt. remarks that as a result BP med. was changed , pt. has a sleep number bed & he reports the score averages in the 80% range    PSA elevation    History of    Recurrent sinus infections    related to fracture nose x2, also reports that he has a deviated septum     Past Surgical History:  Procedure Laterality Date   COLONOSCOPY  2008   3 internal hemorrhoids, diverticulosis;Dr.Jacobs   INSERTION OF MESH N/A 11/13/2016   Procedure: INSERTION OF MESH;  Surgeon: Rubin Calamity, MD;  Location: [facility] OR;  Service: General;  Laterality: N/A;   LAPAROSCOPIC INGUINAL HERNIA WITH UMBILICAL HERNIA Left 11/13/2016   Procedure: LAPAROSCOPIC LEFT INGUINAL HERNIA  AND UMBILICAL HERNIA REPAIRS WITH MESH;  Surgeon: Rubin Calamity, MD;  Location: [facility] OR;  Service: General;  Laterality: Left;   PROSTATE BIOPSY     x 2, once abnormal,  once normal   TONSILLECTOMY      Family History  Problem Relation Age of Onset   Lung cancer Maternal Grandfather        enviromental    Heart attack Paternal Uncle    Prostate cancer Brother     Social History   Tobacco Use   Smoking status: Never   Smokeless tobacco: Never  Vaping Use   Vaping status: Never Used  Substance Use Topics   Alcohol use: Yes    Comment: daily   Drug use: No    ROS   Objective:   Vitals: BP (!) (P) 158/81 (BP Location: Right Arm)   Pulse (P) 93   Temp (P) 98.1 F (36.7 C) (Oral)   Resp (P) 20   SpO2 (P) 95%   BP Readings from Last 3 Encounters:  08/31/23 (!) (P) 158/81  08/27/23 124/62  08/13/23 130/60    BP recheck by  PA Lynx Goodrich 144/71.   Physical Exam Constitutional:      General: He is not in acute distress.    Appearance: Normal appearance. He is well-developed. He is not ill-appearing, toxic-appearing or diaphoretic.  HENT:     Head: Normocephalic and atraumatic.     Right Ear: External ear normal.     Left Ear: External ear normal.     Nose: Nose normal.     Mouth/Throat:     Mouth: Mucous membranes are moist.  Eyes:     General: No scleral icterus.       Right eye: No discharge.        Left eye: No discharge.     Extraocular Movements: Extraocular movements intact.  Neck:     Vascular: No carotid bruit.  Cardiovascular:     Rate and Rhythm: Normal rate and regular rhythm.     Heart sounds: Normal heart sounds. No murmur heard.    No friction rub. No gallop.  Pulmonary:     Effort: Pulmonary effort is normal. No respiratory distress.     Breath sounds: Normal breath sounds. No stridor. No wheezing, rhonchi or rales.  Neurological:     Mental Status: He is alert and oriented to person, place, and time.     Cranial Nerves: No cranial nerve deficit.     Motor: No weakness.     Coordination: Coordination normal.     Gait: Gait normal.  Psychiatric:        Mood and Affect: Mood normal.        Behavior: Behavior normal.        Thought Content: Thought content normal.     Assessment and Plan :   PDMP not reviewed this encounter.  1. Essential hypertension    Recommended restarting hydrochlorothiazide  at 12.5 mg once daily.  Emphasized need for close follow-up with his PCP.  Counseled patient on potential for adverse effects with medications prescribed/recommended today, ER and return-to-clinic precautions discussed, patient verbalized understanding.    [name], [place] 08/31/23 1327

## 2023-09-04 NOTE — Progress Notes (Unsigned)
 LILLETTE Ileana Collet, PhD, LAT, ATC acting as a scribe for Artist Lloyd, MD.  Todd Larson is a 83 y.o. male who presents to [facility] today for bilat leg pain x a year. He describes the pain as a constant ache. He notes Dr. Johnny dx him w/ hypotension and stopped the hydrochlorothiazide , but then experienced hypertension and was seen at [facility] on 8/30. Pt locates pain to all over both legs and esp into both knees. He notes that he is limited in the ability to go for long walks.   Low back pain: no Radiating pain: yes LE numbness/tingling: no LE weakness: yes Aggravates: walking Treatments tried: nothing  Pertinent review of systems: No fevers or chills  Relevant historical information: Hypertension   Exam:  BP (!) 162/76   Pulse 98   Ht 5' 8 (1.727 m)   Wt 218 lb (98.9 kg)   SpO2 97%   BMI 33.15 kg/m  General: Well Developed, well nourished, and in no acute distress.   MSK: L-spine normal appearing normal lumbar motion lower extremity strength is intact. Reflexes are intact.  Knees bilaterally normal.  Mild crepitation with extension.  Intact strength.  Pulses intact bilateral ankles dorsal pedis.  Lab and Radiology Results  X-ray images lumbar spine and bilateral knees obtained today personally and independently interpreted.  Lumbar spine: DDD L5-S1.  No acute fractures.  Right knee: Minimal degenerative changes.  Left knee: Minimal degenerative changes.  Await formal radiology review    Assessment and Plan: 83 y.o. male with bilateral lower extremity pain due to prior dominantly to lumbar radiculopathy.  So the pain is coming from the knee joints as well.  Additionally symptoms could be due to neurogenic claudication or traditional vascular claudication.  Plan for x-rays as above, physical therapy, and ABI.  Additionally proceed with basic inflammatory workup listed below.  Recheck in 3 weeks.   PDMP not reviewed this  encounter. Orders Placed This Encounter  Procedures   DG Lumbar Spine 2-3 Views    Standing Status:   Future    Number of Occurrences:   1    Expiration Date:   10/05/2023    Reason for Exam (SYMPTOM  OR DIAGNOSIS REQUIRED):   bilateral leg pain    Preferred imaging location?:   [facility]   DG Knee AP/LAT W/Sun[place] Left    Standing Status:   Future    Number of Occurrences:   1    Expiration Date:   10/05/2023    Reason for Exam (SYMPTOM  OR DIAGNOSIS REQUIRED):   bilateral knee pain    Preferred imaging location?:   [facility]   DG Knee AP/LAT W/Sunrise Right    Standing Status:   Future    Number of Occurrences:   1    Expiration Date:   10/05/2023    Reason for Exam (SYMPTOM  OR DIAGNOSIS REQUIRED):   bilateral knee pain    Preferred imaging location?:   [place]   C-reactive protein    Standing Status:   Future    Number of Occurrences:   1    Expiration Date:   09/04/2024   Sedimentation rate    Standing Status:   Future    Number of Occurrences:   1    Expiration Date:   09/04/2024   CK (Creatine Kinase)   Ambulatory referral to Physical Therapy    Referral Priority:   Routine  Referral Type:   Physical Medicine    Referral Reason:   Specialty Services Required    Requested Specialty:   Physical Therapy    Number of Visits Requested:   1   No orders of the defined types were placed in this encounter.    Discussed warning signs or symptoms. Please see discharge instructions. Patient expresses understanding.   The above documentation has been reviewed and is accurate and complete Artist Lloyd, M.D.

## 2023-09-05 ENCOUNTER — Ambulatory Visit (INDEPENDENT_AMBULATORY_CARE_PROVIDER_SITE_OTHER): Admitting: Family Medicine

## 2023-09-05 ENCOUNTER — Telehealth: Payer: Self-pay

## 2023-09-05 ENCOUNTER — Ambulatory Visit

## 2023-09-05 VITALS — BP 162/76 | HR 98 | Ht 68.0 in | Wt 218.0 lb

## 2023-09-05 DIAGNOSIS — M25562 Pain in left knee: Secondary | ICD-10-CM

## 2023-09-05 DIAGNOSIS — M25561 Pain in right knee: Secondary | ICD-10-CM | POA: Diagnosis not present

## 2023-09-05 DIAGNOSIS — G8929 Other chronic pain: Secondary | ICD-10-CM

## 2023-09-05 DIAGNOSIS — M79604 Pain in right leg: Secondary | ICD-10-CM

## 2023-09-05 DIAGNOSIS — M48061 Spinal stenosis, lumbar region without neurogenic claudication: Secondary | ICD-10-CM | POA: Diagnosis not present

## 2023-09-05 DIAGNOSIS — M51379 Other intervertebral disc degeneration, lumbosacral region without mention of lumbar back pain or lower extremity pain: Secondary | ICD-10-CM | POA: Diagnosis not present

## 2023-09-05 DIAGNOSIS — M79605 Pain in left leg: Secondary | ICD-10-CM | POA: Diagnosis not present

## 2023-09-05 LAB — C-REACTIVE PROTEIN: CRP: <1 mg/dL (ref 0.5–20.0)

## 2023-09-05 LAB — CK: Total CK: 39 U/L (ref 17–232)

## 2023-09-05 LAB — SEDIMENTATION RATE: Sed Rate: 3 mm/h (ref 0–20)

## 2023-09-05 NOTE — Addendum Note (Signed)
 Addended by: GEROME ILEANA RAMAN on: 09/05/2023 01:53 PM   Modules accepted: Orders

## 2023-09-05 NOTE — Telephone Encounter (Signed)
No precert required 

## 2023-09-05 NOTE — Telephone Encounter (Signed)
 Ordered a vasc US . Please check for pre-cert

## 2023-09-05 NOTE — Patient Instructions (Addendum)
 Thank you for coming in today.   Please get an Xray today before you leave   Please get labs today before you leave   I've referred you to Physical Therapy.  Let us  know if you don't hear from them in one week.   Check back in 3 weeks

## 2023-09-06 ENCOUNTER — Ambulatory Visit: Payer: Self-pay | Admitting: Family Medicine

## 2023-09-06 NOTE — Progress Notes (Signed)
 Inflammatory labs look just fine.  No evidence of muscle inflammation.

## 2023-09-12 ENCOUNTER — Ambulatory Visit (HOSPITAL_COMMUNITY)
Admission: RE | Admit: 2023-09-12 | Discharge: 2023-09-12 | Disposition: A | Discharge status: Home / Self Care | Source: Ambulatory Visit | Attending: Vascular Surgery | Admitting: Vascular Surgery

## 2023-09-12 DIAGNOSIS — M79604 Pain in right leg: Secondary | ICD-10-CM | POA: Diagnosis present

## 2023-09-12 DIAGNOSIS — M79605 Pain in left leg: Secondary | ICD-10-CM | POA: Insufficient documentation

## 2023-09-12 LAB — VAS US ABI WITH/WO TBI
Left ABI: 1.14
Right ABI: 1.1

## 2023-09-13 NOTE — Progress Notes (Signed)
 Vascular study is reassuring.  No evidence of impaired artery blood flow in the leg.

## 2023-09-16 NOTE — Progress Notes (Signed)
 Low back x-ray shows medium arthritis worse at the base of the spine.

## 2023-09-16 NOTE — Progress Notes (Signed)
Right knee x-ray looks pretty normal to radiology.

## 2023-09-16 NOTE — Progress Notes (Signed)
Left knee x-ray looks pretty normal to radiology.

## 2023-09-25 ENCOUNTER — Ambulatory Visit (INDEPENDENT_AMBULATORY_CARE_PROVIDER_SITE_OTHER): Admitting: Adult Health

## 2023-09-25 ENCOUNTER — Ambulatory Visit: Attending: Family Medicine

## 2023-09-25 ENCOUNTER — Encounter: Payer: Self-pay | Admitting: Adult Health

## 2023-09-25 VITALS — BP 120/60 | HR 100 | Temp 98.2°F | Ht 68.0 in | Wt 217.0 lb

## 2023-09-25 DIAGNOSIS — R262 Difficulty in walking, not elsewhere classified: Secondary | ICD-10-CM | POA: Insufficient documentation

## 2023-09-25 DIAGNOSIS — M79661 Pain in right lower leg: Secondary | ICD-10-CM | POA: Insufficient documentation

## 2023-09-25 DIAGNOSIS — I10 Essential (primary) hypertension: Secondary | ICD-10-CM

## 2023-09-25 DIAGNOSIS — M79605 Pain in left leg: Secondary | ICD-10-CM | POA: Insufficient documentation

## 2023-09-25 DIAGNOSIS — M79604 Pain in right leg: Secondary | ICD-10-CM | POA: Insufficient documentation

## 2023-09-25 DIAGNOSIS — M79662 Pain in left lower leg: Secondary | ICD-10-CM | POA: Diagnosis present

## 2023-09-25 MED ORDER — HYDROCHLOROTHIAZIDE 12.5 MG PO TABS: 12.5000 mg | ORAL_TABLET | Freq: Every day | ORAL | 3 refills | Status: AC

## 2023-09-25 NOTE — Therapy (Unsigned)
 OUTPATIENT PHYSICAL THERAPY LOWER EXTREMITY EVALUATION   Patient Name: Todd Larson MRN: [id number] DOB:1940/10/16, 83 y.o., male Today's Date: 09/26/2023  END OF SESSION:  PT End of Session - 09/25/23 1644     Visit Number 1    Date for Recertification  11/21/23    PT Start Time 1015    PT Stop Time 1100    PT Time Calculation (min) 45 min    Activity Tolerance Patient tolerated treatment well    Behavior During Therapy WFL for tasks assessed/performed          Past Medical History:  Diagnosis Date   Arthritis    cervical spine - T7-8 & lumbar area - bone spurs up & down the back    Bulging discs     thoracic spine   Diverticulosis of colon    GERD (gastroesophageal reflux disease)    related to intestinal system, - has started Align  4-5 days ago-    Hyperlipidemia    Hypertension    Inguinal hernia    OSA (obstructive sleep apnea)    saw Dr. Neysa- 02/2015- pt. remarks that as a result BP med. was changed , pt. has a sleep number bed & he reports the score averages in the 80% range    PSA elevation    History of    Recurrent sinus infections    related to fracture nose x2, also reports that he has a deviated septum   Past Surgical History:  Procedure Laterality Date   COLONOSCOPY  2008   3 internal hemorrhoids, diverticulosis;Dr.Jacobs   INSERTION OF MESH N/A 11/13/2016   Procedure: INSERTION OF MESH;  Surgeon: Rubin Calamity, MD;  Location: [facility] OR;  Service: General;  Laterality: N/A;   LAPAROSCOPIC INGUINAL HERNIA WITH UMBILICAL HERNIA Left 11/13/2016   Procedure: LAPAROSCOPIC LEFT INGUINAL HERNIA  AND UMBILICAL HERNIA REPAIRS WITH MESH;  Surgeon: Rubin Calamity, MD;  Location: [facility] OR;  Service: General;  Laterality: Left;   PROSTATE BIOPSY     x 2, once abnormal, once normal   TONSILLECTOMY     Patient Active Problem List   Diagnosis Date Noted   Right knee meniscal tear 06/01/2015   Obstructive sleep apnea 02/07/2015   Thoracic spine pain 03/29/2014    Tenosynovitis of thumb 03/29/2014   Cervical radiculopathy due to degenerative joint disease of spine 10/10/2013   Bursitis of left shoulder 09/23/2013   Chronic low back pain 12/15/2012   Chronic cervical pain 06/09/2012   Hyperlipidemia 07/06/2010   Essential hypertension 11/03/2009   Inguinal hernia 11/03/2009   Nonallergic rhinitis 08/24/2009   Ventral hernia 05/20/2009   Diverticulosis of colon 05/20/2009   Benign neoplasm of skin 10/26/2008   GERD 10/01/2006    PCP: Merna Huxley NP  REFERRING PROVIDER: Joane Birmingham, MD  REFERRING DIAG: lumbar radiculitis  THERAPY DIAG:  Difficulty in walking, not elsewhere classified  Pain in left lower leg  Pain in right lower leg  Rationale for Evaluation and Treatment: Rehabilitation  ONSET DATE: 6 months march 2025  SUBJECTIVE:   SUBJECTIVE STATEMENT: For 8 months I walked 30 min, every day, now something has changed and I just can't do it , it feels like my legs are logs or full of water  PERTINENT HISTORY: Reports wakes up every morning with pain B lower legs, ant and lateral lower legs, also same pain pattern with walking .  Referred by sports med MD  PAIN:  Are you having pain? Yes: NPRS scale: 0  to6 Pain location: B anteirolateral lower legs  Pain description: deep pain, feels heavy like I can't lift them Aggravating factors: in am when waking, lying on L side with R leg pain, lying on R side with L leg pain Relieving factors: sitting,  PRECAUTIONS: None  RED FLAGS: None   WEIGHT BEARING RESTRICTIONS: No  FALLS:  Has patient fallen in last 6 months? No  LIVING ENVIRONMENT: Lives with: lives alone Lives in: House/apartment Stairs: No Has following equipment at home: Single point cane  OCCUPATION: retired, does foster cats  PLOF: Independent  PATIENT GOALS: be able to walk 30 min to get my activity level also wake up without pain  NEXT MD VISIT: 09/26/23  OBJECTIVE:  Note: Objective measures were  completed at Evaluation unless otherwise noted.  DIAGNOSTIC FINDINGS: x rays with arthritic changes L5/S1  PATIENT SURVEYS:  PSFS: THE PATIENT SPECIFIC FUNCTIONAL SCALE  Place score of 0-10 (0 = unable to perform activity and 10 = able to perform activity at the same level as before injury or problem)  Activity Date: 09/25/23    Walk greater than 20 min 2    2.stand from sleeping/lying down position  0    3.ascend/ descend 4 steps  0         Total Score 2      Total Score = Sum of activity scores/number of activities  Minimally Detectable Change: 3 points (for single activity); 2 points (for average score)  [name] (nd). The Patient Specific Functional Scale . Retrieved from [email or web address]   COGNITION: Overall cognitive status: Within functional limits for tasks assessed     SENSATION: WFL  EDEMA:  None noted  POSTURE: loss of lumbar lordosis, Er B hips, post pelvic tilt in standing and with gait  PALPATION: Non tender over post hips, tender with PA glide L2/3 Non tender B lower legs, ant, post musculature  LOWER EXTREMITY ROM:wfl B LUMBAR ROM: flex wnl, ext 50%, sb R with pain central lumbar, L sb wnl  LOWER EXTREMITY MMT:  MMT Right eval Left eval  Hip flexion wfl wfl  Hip extension    Hip abduction wfl wfl  Hip adduction    Hip internal rotation    Hip external rotation    Knee flexion    Knee extension    Ankle dorsiflexion    Ankle plantarflexion toe walking 4 3-  Ankle inversion    Ankle eversion     (Blank rows = not tested)  LOWER EXTREMITY SPECIAL TESTS:  na  FUNCTIONAL TESTS:  30 sec sit to sit 13 reps    GAIT: Distance walked: 21' in clinic, no deficits noted, no device                                                                                                                               TREATMENT DATE: 09/26/23  Eval, education in anatomy of spine,  positioning to decompress  lumbar spine,  Education in sleeping position to reduce excessive side bending lumbar Inst in 2 ex indicated below to improve lumbar spine mobility and L ankle plantarflexion strength  PATIENT EDUCATION:  Education details: POC, goals Person educated: Patient Education method: Explanation, Demonstration, Tactile cues, Verbal cues, and Handouts Education comprehension: verbalized understanding  HOME EXERCISE PROGRAM: Access Code: [email or web address] URL: [email or web address] Date: 09/25/2023 Prepared by: Geo Slone  Exercises - Supine Lower Trunk Rotation  - 1 x daily - 7 x weekly - 3 sets - 10 reps - Long Sitting Ankle Plantar Flexion with Resistance  - 1 x daily - 7 x weekly - 3 sets - 10 reps  ASSESSMENT:  CLINICAL IMPRESSION: Patient is a 84 y.o. male who was evaluated today by physical therapy due to onset of B lower leg pain and reduced ability to walk. Referred by sport medicine MD.  Minimal back pain noted by pt, but he does have marked weakness L plantarflexors, and some provocation of lower back pain with R side bending.  He should benefit from physical therapy to address his pain and improve his activity tolerance, sleeping comfort.. primarily needs activities to reduce compression lumbar region  OBJECTIVE IMPAIRMENTS: decreased balance, decreased endurance, decreased mobility, difficulty walking, decreased strength, hypomobility, and pain.   ACTIVITY LIMITATIONS: sleeping, stairs, transfers, bed mobility, and locomotion level  PARTICIPATION LIMITATIONS: laundry, shopping, and community activity  PERSONAL FACTORS: Age, Behavior pattern, Fitness, Time since onset of injury/illness/exacerbation, and 1 comorbidity: cervical radiculopathy are also affecting patient's functional outcome.   REHAB POTENTIAL: Good  CLINICAL DECISION MAKING: Evolving/moderate complexity  EVALUATION COMPLEXITY: Moderate   GOALS: Goals reviewed with patient?  Yes  SHORT TERM GOALS: Target date: 2 weeks ,10/09/23 I HEP Baseline: Goal status: INITIAL   LONG TERM GOALS: Target date: 8 weeks 11/21/23  Able to walk 30 min 3 to 5 x week without interference from B lower leg pain Baseline:  Goal status: INITIAL  2.  Improve B plantarflexor strength L from 3+/5  and R form 4/5 to 5/5 for improved balance Baseline:  Goal status: INITIAL  3.  Improve lumbar side bending to R to wnl without provocation of Sx Baseline:  Goal status: INITIAL   PLAN:  PT FREQUENCY: 2x/week  PT DURATION: 8 weeks  PLANNED INTERVENTIONS: 97110-Therapeutic exercises, 97530- Therapeutic activity, [id number]- Neuromuscular re-education, 97535- Self Care, [id number]- Manual therapy, [id number]- Electrical stimulation (unattended), 97035- Ultrasound, [id number]- Traction (mechanical), and Patient/Family education  PLAN FOR NEXT SESSION: activities to improve B ankle strength, stretching Lumbar region   Gamaliel Charney L Osborne Serio, PT, DPT, OCS 09/26/2023, 5:04 PM

## 2023-09-25 NOTE — Progress Notes (Signed)
 Subjective:    Patient ID: Todd Larson, male    DOB: 1940-04-24, 83 y.o.   MRN: [id number]  HPI He presents to the office today for follow up regarding hypertension. He was seen by another provider in the office a month ago and his blood pressure was trending on the low side 110-120 systolic.  He was getting spells where he felt lightheaded especially when standing up and shower.  It was thought that his blood pressure was overtreated and he was advised to stop hydrochlorothiazide .  A few days later he checked his blood pressure at home and he reports that it was in the 180s systolic, he went to urgent care where his blood pressure was in the 150s systolic and was advised to restart HCTZ at 12.5 mg which he did.  He has not been feeling dizziness or lightheadedness shower since starting HCTZ 12.5 mg with his dose of losartan  and his BP has been within goal.  He does need a refill  Review of Systems See HPI   Past Medical History:  Diagnosis Date   Arthritis    cervical spine - T7-8 & lumbar area - bone spurs up & down the back    Bulging discs     thoracic spine   Diverticulosis of colon    GERD (gastroesophageal reflux disease)    related to intestinal system, - has started Align  4-5 days ago-    Hyperlipidemia    Hypertension    Inguinal hernia    OSA (obstructive sleep apnea)    saw Dr. Neysa- 02/2015- pt. remarks that as a result BP med. was changed , pt. has a sleep number bed & he reports the score averages in the 80% range    PSA elevation    History of    Recurrent sinus infections    related to fracture nose x2, also reports that he has a deviated septum    Social History   Socioeconomic History   Marital status: Single    Spouse name: Not on file   Number of children: 0   Years of education: Not on file   Highest education level: Not on file  Occupational History   Occupation: retired  Tobacco Use   Smoking status: Never   Smokeless tobacco: Never  Vaping Use    Vaping status: Never Used  Substance and Sexual Activity   Alcohol use: Yes    Comment: daily   Drug use: No   Sexual activity: Not on file  Other Topics Concern   Not on file  Social History Narrative   Retired    Single    No kids       He likes to read, volunteers with an animal rescue group.       Social Drivers of Corporate investment banker Strain: Low Risk  (12/19/2022)   Overall Financial Resource Strain (CARDIA)    Difficulty of Paying Living Expenses: Not hard at all  Food Insecurity: No Food Insecurity (12/19/2022)   Hunger Vital Sign    Worried About Running Out of Food in the Last Year: Never true    Ran Out of Food in the Last Year: Never true  Transportation Needs: No Transportation Needs (12/19/2022)   PRAPARE - Administrator, Civil Service (Medical): No    Lack of Transportation (Non-Medical): No  Physical Activity: Sufficiently Active (12/19/2022)   Exercise Vital Sign    Days of Exercise per Week: 7 days  Minutes of Exercise per Session: 30 min  Stress: No Stress Concern Present (12/19/2022)   [employer] of Occupational Health - Occupational Stress Questionnaire    Feeling of Stress : Not at all  Social Connections: Socially Isolated (12/19/2022)   Social Connection and Isolation Panel    Frequency of Communication with Friends and Family: More than three times a week    Frequency of Social Gatherings with Friends and Family: More than three times a week    Attends Religious Services: Never    Database administrator or Organizations: No    Attends Banker Meetings: Never    Marital Status: Never married  Intimate Partner Violence: Not At Risk (12/19/2022)   Humiliation, Afraid, Rape, and Kick questionnaire    Fear of Current or Ex-Partner: No    Emotionally Abused: No    Physically Abused: No    Sexually Abused: No    Past Surgical History:  Procedure Laterality Date   COLONOSCOPY  2008   3 internal  hemorrhoids, diverticulosis;Dr.Jacobs   INSERTION OF MESH N/A 11/13/2016   Procedure: INSERTION OF MESH;  Surgeon: Rubin Calamity, MD;  Location: [facility] OR;  Service: General;  Laterality: N/A;   LAPAROSCOPIC INGUINAL HERNIA WITH UMBILICAL HERNIA Left 11/13/2016   Procedure: LAPAROSCOPIC LEFT INGUINAL HERNIA  AND UMBILICAL HERNIA REPAIRS WITH MESH;  Surgeon: Rubin Calamity, MD;  Location: [facility] OR;  Service: General;  Laterality: Left;   PROSTATE BIOPSY     x 2, once abnormal, once normal   TONSILLECTOMY      Family History  Problem Relation Age of Onset   Lung cancer Maternal Grandfather        enviromental    Heart attack Paternal Uncle    Prostate cancer Brother     Allergies  Allergen Reactions   Amoxicillin -Pot Clavulanate Diarrhea and Other (See Comments)    Night sweats, not allergic to Amoxicillin , just can't tolerate augmentin    Current Outpatient Medications on File Prior to Visit  Medication Sig Dispense Refill   Blood Pressure Monitoring (ADULT BLOOD PRESSURE CUFF LG) KIT To monitor Blood pressure cuff 1 kit 0   carbamide peroxide (DEBROX) 6.5 % OTIC solution Place 5 drops into both ears daily as needed. 15 mL 0   Cholecalciferol (VITAMIN D -3 PO) Take 2,000 Units by mouth daily at 12 noon.     finasteride (PROSCAR) 5 MG tablet Take 5 mg by mouth daily.     fluticasone  (FLONASE ) 50 MCG/ACT nasal spray USE 1 SPRAY(S) IN EACH NOSTRIL TWICE DAILY AS NEEDED FOR RUNNY NOSE 16 g 0   hydrochlorothiazide  (HYDRODIURIL ) 12.5 MG tablet Take 1 tablet (12.5 mg total) by mouth daily. 30 tablet 0   loratadine  (CLARITIN ) 10 MG tablet Take 1 tablet (10 mg total) by mouth daily. 30 tablet 0   losartan  (COZAAR ) 50 MG tablet Take 1 tablet (50 mg total) by mouth daily. 90 tablet 3   Multiple Vitamins-Minerals (ONE A DAY MEN 50 PLUS PO) Take by mouth daily.     Polyvinyl Alcohol-Povidone (REFRESH OP) Place 1 drop as needed into both eyes (for dry eyes).     No current facility-administered  medications on file prior to visit.    BP 120/60   Pulse 100   Temp 98.2 F (36.8 C) (Oral)   Ht 5' 8 (1.727 m)   Wt 217 lb (98.4 kg)   SpO2 97%   BMI 32.99 kg/m       Objective:   Physical  Exam Vitals and nursing note reviewed.  Constitutional:      Appearance: Normal appearance. He is obese.  Cardiovascular:     Rate and Rhythm: Normal rate and regular rhythm.     Pulses: Normal pulses.     Heart sounds: Normal heart sounds.  Pulmonary:     Effort: Pulmonary effort is normal.     Breath sounds: Normal breath sounds.  Skin:    General: Skin is warm and dry.     Capillary Refill: Capillary refill takes less than 2 seconds.  Neurological:     General: No focal deficit present.     Mental Status: He is alert and oriented to person, place, and time.  Psychiatric:        Mood and Affect: Mood normal.        Behavior: Behavior normal.        Thought Content: Thought content normal.        Judgment: Judgment normal.        Assessment & Plan:  1. Essential hypertension (Primary) - At goal. No change in therapy  - hydrochlorothiazide  (HYDRODIURIL ) 12.5 MG tablet; Take 1 tablet (12.5 mg total) by mouth daily.  Dispense: 90 tablet; Refill: 3  Previn Jian, NP

## 2023-09-26 ENCOUNTER — Ambulatory Visit (INDEPENDENT_AMBULATORY_CARE_PROVIDER_SITE_OTHER): Admitting: Family Medicine

## 2023-09-26 ENCOUNTER — Encounter: Payer: Self-pay | Admitting: Family Medicine

## 2023-09-26 ENCOUNTER — Other Ambulatory Visit: Payer: Self-pay

## 2023-09-26 VITALS — BP 130/62 | HR 97 | Ht 68.0 in | Wt 216.0 lb

## 2023-09-26 DIAGNOSIS — M5416 Radiculopathy, lumbar region: Secondary | ICD-10-CM | POA: Diagnosis not present

## 2023-09-26 DIAGNOSIS — M79604 Pain in right leg: Secondary | ICD-10-CM

## 2023-09-26 DIAGNOSIS — M79605 Pain in left leg: Secondary | ICD-10-CM | POA: Diagnosis not present

## 2023-09-26 NOTE — Patient Instructions (Addendum)
 Thank you for coming in today.   We are ordering an MRI for you today.  The imaging office will be calling you to schedule your appointment after we obtain authorization from your insurance company.  If you have not heard from [facility] within a week, please call [phone] to schedule your MRI.   Please be sure you have signed up for MyChart so that we can get your results to you.  We will be in touch with you as soon as we can.  Please know, it can take up to 3-4 business days for the radiologist and Dr. Joane to have time to review the results and determine the best plan of care.  If there is something that appears to be surgical or needs a referral to other specialists we will let you know through MyChart or telephone.  Otherwise we will plan to schedule a follow up appointment with Dr. Joane once we have the results.    See you back for MRI review.

## 2023-09-26 NOTE — Progress Notes (Signed)
   I, Leotis Batter, CMA acting as a scribe for Artist Lloyd, MD.  Todd Larson is a 83 y.o. male who presents to [facility] at [facility] today for f/u bilat leg pain. Pt was last seen by Dr. Lloyd on 09/05/23 and labs were obtained and ABI ordered. Also referred to PT, 1st visit scheduled for tomorrow.   Today, pt reports continued leg pain. Unable to walk longer than 30 mins at a time. Denies n/t/w. Soreness is worse in the morning and improve throughout the day with movement, though it never resolves.   Dx testing: 09/12/23 Vasc US  09/05/23 R & L knee, L-spine XR, and labs  Pertinent review of systems: No fevers or chills  Relevant historical information: Hypertension   Exam:  BP 130/62   Pulse 97   Ht 5' 8 (1.727 m)   Wt 216 lb (98 kg)   SpO2 96%   BMI 32.84 kg/m  General: Well Developed, well nourished, and in no acute distress.   MSK: Lower extremity strength mildly reduced.    Lab and Radiology Results  EXAM: LUMBAR SPINE - 2-3 VIEW   COMPARISON:  CT abdomen and pelvis 08/09/2017   FINDINGS: There is mild-to-moderate disc space narrowing throughout all levels of the lumbar spine most significant at L5-S1. Endplate osteophytes are seen throughout. Findings are similar to 2019. Alignment is anatomic. No evidence for fracture. There are atherosclerotic calcifications of the aorta.   IMPRESSION: Mild-to-moderate multilevel degenerative disc disease, most significant at L5-S1.     Electronically Signed   By: Greig Pique M.D.   On: 09/13/2023 19:51 I, Artist Lloyd, personally (independently) visualized and performed the interpretation of the images attached in this note.     Assessment and Plan: 83 y.o. male with bilateral lower extremity pain with some mild weakness found in physical therapy.  At this point we will proceed to lumbar spine MRI to characterize radiculopathy.  X-rays and symptoms are concerning for spinal stenosis with  neurogenic claudication.  Fortunately he did have a normal ABI recently.  Plan for lumbar spine MRI.  Anticipate epidural steroid injection or neurosurgery referral.   PDMP not reviewed this encounter. Orders Placed This Encounter  Procedures   MR LUMBAR SPINE WO CONTRAST    Standing Status:   Future    Expiration Date:   10/26/2023    What is the patient's sedation requirement?:   No Sedation    Does the patient have a pacemaker or implanted devices?:   No    Preferred imaging location?:   [facility] (table limit-550lbs)   No orders of the defined types were placed in this encounter.    Discussed warning signs or symptoms. Please see discharge instructions. Patient expresses understanding.   The above documentation has been reviewed and is accurate and complete Artist Lloyd, M.D.

## 2023-09-27 ENCOUNTER — Ambulatory Visit: Admitting: Physical Therapy

## 2023-09-27 ENCOUNTER — Encounter: Payer: Self-pay | Admitting: Physical Therapy

## 2023-09-27 DIAGNOSIS — M79604 Pain in right leg: Secondary | ICD-10-CM | POA: Diagnosis not present

## 2023-09-27 DIAGNOSIS — M79661 Pain in right lower leg: Secondary | ICD-10-CM

## 2023-09-27 DIAGNOSIS — M79605 Pain in left leg: Secondary | ICD-10-CM | POA: Diagnosis not present

## 2023-09-27 DIAGNOSIS — R262 Difficulty in walking, not elsewhere classified: Secondary | ICD-10-CM | POA: Diagnosis not present

## 2023-09-27 DIAGNOSIS — M79662 Pain in left lower leg: Secondary | ICD-10-CM | POA: Diagnosis not present

## 2023-09-27 NOTE — Therapy (Signed)
 OUTPATIENT PHYSICAL THERAPY LOWER EXTREMITY EVALUATION   Patient Name: Todd Larson MRN: [id number] DOB:1940-07-08, 83 y.o., male Today's Date: 09/27/2023  END OF SESSION:  PT End of Session - 09/27/23 0751     Visit Number 2    Date for Recertification  11/21/23    Authorization Type Medicare    PT Start Time 0751    PT Stop Time 0840    PT Time Calculation (min) 49 min    Activity Tolerance Patient tolerated treatment well    Behavior During Therapy WFL for tasks assessed/performed          Past Medical History:  Diagnosis Date   Arthritis    cervical spine - T7-8 & lumbar area - bone spurs up & down the back    Bulging discs     thoracic spine   Diverticulosis of colon    GERD (gastroesophageal reflux disease)    related to intestinal system, - has started Align  4-5 days ago-    Hyperlipidemia    Hypertension    Inguinal hernia    OSA (obstructive sleep apnea)    saw Dr. Neysa- 02/2015- pt. remarks that as a result BP med. was changed , pt. has a sleep number bed & he reports the score averages in the 80% range    PSA elevation    History of    Recurrent sinus infections    related to fracture nose x2, also reports that he has a deviated septum   Past Surgical History:  Procedure Laterality Date   COLONOSCOPY  2008   3 internal hemorrhoids, diverticulosis;Dr.Jacobs   INSERTION OF MESH N/A 11/13/2016   Procedure: INSERTION OF MESH;  Surgeon: Rubin Calamity, MD;  Location: [facility] OR;  Service: General;  Laterality: N/A;   LAPAROSCOPIC INGUINAL HERNIA WITH UMBILICAL HERNIA Left 11/13/2016   Procedure: LAPAROSCOPIC LEFT INGUINAL HERNIA  AND UMBILICAL HERNIA REPAIRS WITH MESH;  Surgeon: Rubin Calamity, MD;  Location: [facility] OR;  Service: General;  Laterality: Left;   PROSTATE BIOPSY     x 2, once abnormal, once normal   TONSILLECTOMY     Patient Active Problem List   Diagnosis Date Noted   Right knee meniscal tear 06/01/2015   Obstructive sleep apnea 02/07/2015    Thoracic spine pain 03/29/2014   Tenosynovitis of thumb 03/29/2014   Cervical radiculopathy due to degenerative joint disease of spine 10/10/2013   Bursitis of left shoulder 09/23/2013   Chronic low back pain 12/15/2012   Chronic cervical pain 06/09/2012   Hyperlipidemia 07/06/2010   Essential hypertension 11/03/2009   Inguinal hernia 11/03/2009   Nonallergic rhinitis 08/24/2009   Ventral hernia 05/20/2009   Diverticulosis of colon 05/20/2009   Benign neoplasm of skin 10/26/2008   GERD 10/01/2006    PCP: Merna Huxley NP  REFERRING PROVIDER: Joane Birmingham, MD  REFERRING DIAG: lumbar radiculitis  THERAPY DIAG:  Difficulty in walking, not elsewhere classified  Pain in left lower leg  Pain in right lower leg  Rationale for Evaluation and Treatment: Rehabilitation  ONSET DATE: 6 months march 2025  SUBJECTIVE:   SUBJECTIVE STATEMENT: Doing well, just frustrated that I cannot walk  EVAL:  For 8 months I walked 30 min, every day, now something has changed and I just can't do it , it feels like my legs are logs or full of water  PERTINENT HISTORY: Reports wakes up every morning with pain B lower legs, ant and lateral lower legs, also same pain pattern with walking .  Referred by sports med MD  PAIN:  Are you having pain? Yes: NPRS scale: 0 to6 Pain location: B anteirolateral lower legs  Pain description: deep pain, feels heavy like I can't lift them Aggravating factors: in am when waking, lying on L side with R leg pain, lying on R side with L leg pain Relieving factors: sitting,  PRECAUTIONS: None  RED FLAGS: None   WEIGHT BEARING RESTRICTIONS: No  FALLS:  Has patient fallen in last 6 months? No  LIVING ENVIRONMENT: Lives with: lives alone Lives in: House/apartment Stairs: No Has following equipment at home: Single point cane  OCCUPATION: retired, does foster cats  PLOF: Independent  PATIENT GOALS: be able to walk 30 min to get my activity level also wake  up without pain  NEXT MD VISIT: 09/26/23  OBJECTIVE:  Note: Objective measures were completed at Evaluation unless otherwise noted.  DIAGNOSTIC FINDINGS: x rays with arthritic changes L5/S1  PATIENT SURVEYS:  PSFS: THE PATIENT SPECIFIC FUNCTIONAL SCALE  Place score of 0-10 (0 = unable to perform activity and 10 = able to perform activity at the same level as before injury or problem)  Activity Date: 09/25/23    Walk greater than 20 min 2    2.stand from sleeping/lying down position  0    3.ascend/ descend 4 steps  0         Total Score 2      Total Score = Sum of activity scores/number of activities  Minimally Detectable Change: 3 points (for single activity); 2 points (for average score)  [name] (nd). The Patient Specific Functional Scale . Retrieved from [email or web address]   COGNITION: Overall cognitive status: Within functional limits for tasks assessed     SENSATION: WFL  EDEMA:  None noted  POSTURE: loss of lumbar lordosis, Er B hips, post pelvic tilt in standing and with gait  PALPATION: Non tender over post hips, tender with PA glide L2/3 Non tender B lower legs, ant, post musculature  LOWER EXTREMITY ROM:wfl B LUMBAR ROM: flex wnl, ext 50%, sb R with pain central lumbar, L sb wnl  LOWER EXTREMITY MMT:  MMT Right eval Left eval  Hip flexion wfl wfl  Hip extension    Hip abduction wfl wfl  Hip adduction    Hip internal rotation    Hip external rotation    Knee flexion    Knee extension    Ankle dorsiflexion    Ankle plantarflexion toe walking 4 3-  Ankle inversion    Ankle eversion     (Blank rows = not tested)  LOWER EXTREMITY SPECIAL TESTS:  na  FUNCTIONAL TESTS:  30 sec sit to sit 13 reps    GAIT: Distance walked: 51' in clinic, no deficits noted, no device  TREATMENT DATE:  09/27/23 Nustep level 5 x 6 minutes 20# Row 20# Lats Slant board stretch REviewed and performed HEP with him, needed a lot of cues due to his tightness Passive HS stretch a lot of cues needed to relax, very very tight Passive piriformis stretch Feet on ball K2C, rotation, posterior activation, isometric abs Added HEP and performed  09/26/23  Eval, education in anatomy of spine, positioning to decompress lumbar spine,  Education in sleeping position to reduce excessive side bending lumbar Inst in 2 ex indicated below to improve lumbar spine mobility and L ankle plantarflexion strength  PATIENT EDUCATION:  Education details: POC, goals Person educated: Patient Education method: Explanation, Demonstration, Tactile cues, Verbal cues, and Handouts Education comprehension: verbalized understanding  HOME EXERCISE PROGRAM: Access Code: [email or web address] URL: [email or web address] Date: 09/25/2023 Prepared by: Amy Speaks  Exercises - Supine Lower Trunk Rotation  - 1 x daily - 7 x weekly - 3 sets - 10 reps - Long Sitting Ankle Plantar Flexion with Resistance  - 1 x daily - 7 x weekly - 3 sets - 10 reps Access Code: [email or web address] URL: [email or web address] Date: 09/27/2023 Prepared by: Ozell Mainland  Exercises - Hooklying Single Knee to Chest Stretch  - 2 x daily - 7 x weekly - 1 sets - 10 reps - 5 hold - Seated Table Hamstring Stretch  - 2 x daily - 7 x weekly - 1 sets - 5 reps - 30 hold - Seated Hamstring Stretch  - 2 x daily - 7 x weekly - 1 sets - 5 reps - 30 hold - Seated Hamstring Stretch with Chair  - 2 x daily - 7 x weekly - 1 sets - 5 reps - 30 hold - Supine Piriformis Stretch Pulling Heel to Hip  - 2 x daily - 7 x weekly - 1 sets - 5 reps - 30 hold ASSESSMENT:  CLINICAL IMPRESSION: Patient is extremely tight in the calves and HS, he avoids stretching, does not like the stretches.  We worked on low load long duration  stretching today, he needs a lot of cues due to his body compensating for the tightness  Patient is a 83 y.o. male who was evaluated today by physical therapy due to onset of B lower leg pain and reduced ability to walk. Referred by sport medicine MD.  Minimal back pain noted by pt, but he does have marked weakness L plantarflexors, and some provocation of lower back pain with R side bending.  He should benefit from physical therapy to address his pain and improve his activity tolerance, sleeping comfort.. primarily needs activities to reduce compression lumbar region  OBJECTIVE IMPAIRMENTS: decreased balance, decreased endurance, decreased mobility, difficulty walking, decreased strength, hypomobility, and pain.   ACTIVITY LIMITATIONS: sleeping, stairs, transfers, bed mobility, and locomotion level  PARTICIPATION LIMITATIONS: laundry, shopping, and community activity  PERSONAL FACTORS: Age, Behavior pattern, Fitness, Time since onset of injury/illness/exacerbation, and 1 comorbidity: cervical radiculopathy are also affecting patient's functional outcome.   REHAB POTENTIAL: Good  CLINICAL DECISION MAKING: Evolving/moderate complexity  EVALUATION COMPLEXITY: Moderate   GOALS: Goals reviewed with patient? Yes  SHORT TERM GOALS: Target date: 2 weeks ,10/09/23 I HEP Baseline: Goal status: INITIAL   LONG TERM GOALS: Target date: 8 weeks 11/21/23  Able to walk 30 min 3 to 5 x week without interference from B lower leg pain Baseline:  Goal status: INITIAL  2.  Improve B plantarflexor strength L from 3+/5  and R form 4/5 to 5/5 for improved balance  Baseline:  Goal status: INITIAL  3.  Improve lumbar side bending to R to wnl without provocation of Sx Baseline:  Goal status: INITIAL   PLAN:  PT FREQUENCY: 2x/week  PT DURATION: 8 weeks  PLANNED INTERVENTIONS: 97110-Therapeutic exercises, 97530- Therapeutic activity, [id number]- Neuromuscular re-education, 97535- Self Care, [id number]-  Manual therapy, [id number]- Electrical stimulation (unattended), 97035- Ultrasound, 02987- Traction (mechanical), and Patient/Family education  PLAN FOR NEXT SESSION: activities to improve B ankle strength, stretching Lumbar region   Seema Blum W, PT, 09/27/2023, 7:52 AM

## 2023-10-03 ENCOUNTER — Encounter: Payer: Self-pay | Admitting: Physical Therapy

## 2023-10-03 ENCOUNTER — Ambulatory Visit: Attending: Family Medicine | Admitting: Physical Therapy

## 2023-10-03 DIAGNOSIS — M79662 Pain in left lower leg: Secondary | ICD-10-CM | POA: Diagnosis not present

## 2023-10-03 DIAGNOSIS — R262 Difficulty in walking, not elsewhere classified: Secondary | ICD-10-CM | POA: Insufficient documentation

## 2023-10-03 DIAGNOSIS — M79661 Pain in right lower leg: Secondary | ICD-10-CM | POA: Diagnosis not present

## 2023-10-03 NOTE — Therapy (Signed)
 OUTPATIENT PHYSICAL THERAPY LOWER EXTREMITY EVALUATION   Patient Name: Todd Larson MRN: [id number] DOB:Nov 01, 1940, 83 y.o., male Today's Date: 10/03/2023  END OF SESSION:  PT End of Session - 10/03/23 1401     Visit Number 3    Date for Recertification  11/21/23    Authorization Type Medicare    PT Start Time 1357    PT Stop Time 1442    PT Time Calculation (min) 45 min    Activity Tolerance Patient tolerated treatment well    Behavior During Therapy WFL for tasks assessed/performed          Past Medical History:  Diagnosis Date   Arthritis    cervical spine - T7-8 & lumbar area - bone spurs up & down the back    Bulging discs     thoracic spine   Diverticulosis of colon    GERD (gastroesophageal reflux disease)    related to intestinal system, - has started Align  4-5 days ago-    Hyperlipidemia    Hypertension    Inguinal hernia    OSA (obstructive sleep apnea)    saw Dr. Neysa- 02/2015- pt. remarks that as a result BP med. was changed , pt. has a sleep number bed & he reports the score averages in the 80% range    PSA elevation    History of    Recurrent sinus infections    related to fracture nose x2, also reports that he has a deviated septum   Past Surgical History:  Procedure Laterality Date   COLONOSCOPY  2008   3 internal hemorrhoids, diverticulosis;Dr.Jacobs   INSERTION OF MESH N/A 11/13/2016   Procedure: INSERTION OF MESH;  Surgeon: Rubin Calamity, MD;  Location: [facility] OR;  Service: General;  Laterality: N/A;   LAPAROSCOPIC INGUINAL HERNIA WITH UMBILICAL HERNIA Left 11/13/2016   Procedure: LAPAROSCOPIC LEFT INGUINAL HERNIA  AND UMBILICAL HERNIA REPAIRS WITH MESH;  Surgeon: Rubin Calamity, MD;  Location: [facility] OR;  Service: General;  Laterality: Left;   PROSTATE BIOPSY     x 2, once abnormal, once normal   TONSILLECTOMY     Patient Active Problem List   Diagnosis Date Noted   Right knee meniscal tear 06/01/2015   Obstructive sleep apnea 02/07/2015    Thoracic spine pain 03/29/2014   Tenosynovitis of thumb 03/29/2014   Cervical radiculopathy due to degenerative joint disease of spine 10/10/2013   Bursitis of left shoulder 09/23/2013   Chronic low back pain 12/15/2012   Chronic cervical pain 06/09/2012   Hyperlipidemia 07/06/2010   Essential hypertension 11/03/2009   Inguinal hernia 11/03/2009   Nonallergic rhinitis 08/24/2009   Ventral hernia 05/20/2009   Diverticulosis of colon 05/20/2009   Benign neoplasm of skin 10/26/2008   GERD 10/01/2006    PCP: Merna Huxley NP  REFERRING PROVIDER: Joane Birmingham, MD  REFERRING DIAG: lumbar radiculitis  THERAPY DIAG:  Difficulty in walking, not elsewhere classified  Pain in left lower leg  Pain in right lower leg  Rationale for Evaluation and Treatment: Rehabilitation  ONSET DATE: 6 months march 2025  SUBJECTIVE:   SUBJECTIVE STATEMENT: I have questions about the HEP  EVAL:  For 8 months I walked 30 min, every day, now something has changed and I just can't do it , it feels like my legs are logs or full of water  PERTINENT HISTORY: Reports wakes up every morning with pain B lower legs, ant and lateral lower legs, also same pain pattern with walking .  Referred  by sports med MD  PAIN:  Are you having pain? Yes: NPRS scale: 0 to6 Pain location: B anteirolateral lower legs  Pain description: deep pain, feels heavy like I can't lift them Aggravating factors: in am when waking, lying on L side with R leg pain, lying on R side with L leg pain Relieving factors: sitting,  PRECAUTIONS: None  RED FLAGS: None   WEIGHT BEARING RESTRICTIONS: No  FALLS:  Has patient fallen in last 6 months? No  LIVING ENVIRONMENT: Lives with: lives alone Lives in: House/apartment Stairs: No Has following equipment at home: Single point cane  OCCUPATION: retired, does foster cats  PLOF: Independent  PATIENT GOALS: be able to walk 30 min to get my activity level also wake up without  pain  NEXT MD VISIT: 09/26/23  OBJECTIVE:  Note: Objective measures were completed at Evaluation unless otherwise noted.  DIAGNOSTIC FINDINGS: x rays with arthritic changes L5/S1  PATIENT SURVEYS:  PSFS: THE PATIENT SPECIFIC FUNCTIONAL SCALE  Place score of 0-10 (0 = unable to perform activity and 10 = able to perform activity at the same level as before injury or problem)  Activity Date: 09/25/23    Walk greater than 20 min 2    2.stand from sleeping/lying down position  0    3.ascend/ descend 4 steps  0         Total Score 2      Total Score = Sum of activity scores/number of activities  Minimally Detectable Change: 3 points (for single activity); 2 points (for average score)  [name] (nd). The Patient Specific Functional Scale . Retrieved from [email or web address]   COGNITION: Overall cognitive status: Within functional limits for tasks assessed     SENSATION: WFL  EDEMA:  None noted  POSTURE: loss of lumbar lordosis, Er B hips, post pelvic tilt in standing and with gait  PALPATION: Non tender over post hips, tender with PA glide L2/3 Non tender B lower legs, ant, post musculature  LOWER EXTREMITY ROM:wfl B LUMBAR ROM: flex wnl, ext 50%, sb R with pain central lumbar, L sb wnl  LOWER EXTREMITY MMT:  MMT Right eval Left eval  Hip flexion wfl wfl  Hip extension    Hip abduction wfl wfl  Hip adduction    Hip internal rotation    Hip external rotation    Knee flexion    Knee extension    Ankle dorsiflexion    Ankle plantarflexion toe walking 4 3-  Ankle inversion    Ankle eversion     (Blank rows = not tested)  LOWER EXTREMITY SPECIAL TESTS:  na  FUNCTIONAL TESTS:  30 sec sit to sit 13 reps    GAIT: Distance walked: 36' in clinic, no deficits noted, no device  TREATMENT DATE:  10/03/23 Nustep level 5 x 5 minutes Reviewed HEP gave cues Gait outside around the back building and then to our front door, took 7 minutes and wsa tired in the legs, no extra pain  STS 2x 5 with weighted ball 20# lats 2x10 20# rows 2x10 Slant board stretch 3 x20 s LEg press 20# 2x10 Back to wall postural alignment and trying to hold 15-20 seconds Feet on ball K2C, rotation, bridge, isometric abs Passive LE stretch  09/27/23 Nustep level 5 x 6 minutes 20# Row 20# Lats Slant board stretch REviewed and performed HEP with him, needed a lot of cues due to his tightness Passive HS stretch a lot of cues needed to relax, very very tight Passive piriformis stretch Feet on ball K2C, rotation, posterior activation, isometric abs Added HEP and performed  09/26/23  Eval, education in anatomy of spine, positioning to decompress lumbar spine,  Education in sleeping position to reduce excessive side bending lumbar Inst in 2 ex indicated below to improve lumbar spine mobility and L ankle plantarflexion strength  PATIENT EDUCATION:  Education details: POC, goals Person educated: Patient Education method: Explanation, Demonstration, Tactile cues, Verbal cues, and Handouts Education comprehension: verbalized understanding  HOME EXERCISE PROGRAM: Access Code: [email or web address] URL: [email or web address] Date: 09/25/2023 Prepared by: Amy Speaks  Exercises - Supine Lower Trunk Rotation  - 1 x daily - 7 x weekly - 3 sets - 10 reps - Long Sitting Ankle Plantar Flexion with Resistance  - 1 x daily - 7 x weekly - 3 sets - 10 reps Access Code: [email or web address] URL: [email or web address] Date: 09/27/2023 Prepared by: Ozell Mainland  Exercises - Hooklying Single Knee to Chest Stretch  - 2 x daily - 7 x weekly - 1 sets - 10 reps - 5 hold - Seated Table Hamstring Stretch  - 2 x daily - 7 x weekly - 1 sets - 5 reps - 30 hold - Seated Hamstring Stretch  - 2 x daily  - 7 x weekly - 1 sets - 5 reps - 30 hold - Seated Hamstring Stretch with Chair  - 2 x daily - 7 x weekly - 1 sets - 5 reps - 30 hold - Supine Piriformis Stretch Pulling Heel to Hip  - 2 x daily - 7 x weekly - 1 sets - 5 reps - 30 hold ASSESSMENT:  CLINICAL IMPRESSION: Patient is extremely tight in the calves and HS, he avoids stretching, does not like the stretches. He reports cannot do the stretches in the bed due to the bed being soft and cannot do floor.  His posture is very poor and had difficulty trying to correct and hold.    Patient is a 83 y.o. male who was evaluated today by physical therapy due to onset of B lower leg pain and reduced ability to walk. Referred by sport medicine MD.  Minimal back pain noted by pt, but he does have marked weakness L plantarflexors, and some provocation of lower back pain with R side bending.  He should benefit from physical therapy to address his pain and improve his activity tolerance, sleeping comfort.. primarily needs activities to reduce compression lumbar region  OBJECTIVE IMPAIRMENTS: decreased balance, decreased endurance, decreased mobility, difficulty walking, decreased strength, hypomobility, and pain.   ACTIVITY LIMITATIONS: sleeping, stairs, transfers, bed mobility, and locomotion level  PARTICIPATION LIMITATIONS: laundry, shopping, and community activity  PERSONAL FACTORS: Age, Behavior pattern, Fitness, Time since onset of injury/illness/exacerbation, and 1 comorbidity: cervical radiculopathy are also affecting patient's functional  outcome.   REHAB POTENTIAL: Good  CLINICAL DECISION MAKING: Evolving/moderate complexity  EVALUATION COMPLEXITY: Moderate   GOALS: Goals reviewed with patient? Yes  SHORT TERM GOALS: Target date: 2 weeks ,10/09/23 I HEP Baseline: Goal status:progressing 10/03/23   LONG TERM GOALS: Target date: 8 weeks 11/21/23  Able to walk 30 min 3 to 5 x week without interference from B lower leg pain Baseline:   Goal status: INITIAL  2.  Improve B plantarflexor strength L from 3+/5  and R form 4/5 to 5/5 for improved balance Baseline:  Goal status: INITIAL  3.  Improve lumbar side bending to R to wnl without provocation of Sx Baseline:  Goal status: INITIAL   PLAN:  PT FREQUENCY: 2x/week  PT DURATION: 8 weeks  PLANNED INTERVENTIONS: 97110-Therapeutic exercises, 97530- Therapeutic activity, [id number]- Neuromuscular re-education, 97535- Self Care, 02859- Manual therapy, G0283- Electrical stimulation (unattended), 97035- Ultrasound, 02987- Traction (mechanical), and Patient/Family education  PLAN FOR NEXT SESSION: activities to improve B ankle strength, stretching Lumbar region   Zulay Corrie W, PT, 10/03/2023, 2:03 PM

## 2023-10-08 ENCOUNTER — Ambulatory Visit: Admitting: Physical Therapy

## 2023-10-08 DIAGNOSIS — M79661 Pain in right lower leg: Secondary | ICD-10-CM

## 2023-10-08 DIAGNOSIS — R262 Difficulty in walking, not elsewhere classified: Secondary | ICD-10-CM | POA: Diagnosis not present

## 2023-10-08 DIAGNOSIS — M79662 Pain in left lower leg: Secondary | ICD-10-CM

## 2023-10-08 NOTE — Therapy (Signed)
 OUTPATIENT PHYSICAL THERAPY LOWER EXTREMITY EVALUATION   Patient Name: Todd Larson MRN: [id number] DOB:[birth year], 83 y.o., male Today's Date: 10/08/2023  END OF SESSION:  PT End of Session - 10/08/23 1228     Visit Number 4    Date for Recertification  11/21/23    Authorization Type Medicare    PT Start Time 1230    PT Stop Time 1315    PT Time Calculation (min) 45 min          Past Medical History:  Diagnosis Date   Arthritis    cervical spine - T7-8 & lumbar area - bone spurs up & down the back    Bulging discs     thoracic spine   Diverticulosis of colon    GERD (gastroesophageal reflux disease)    related to intestinal system, - has started Align  4-5 days ago-    Hyperlipidemia    Hypertension    Inguinal hernia    OSA (obstructive sleep apnea)    saw Dr. Neysa- 02/2015- pt. remarks that as a result BP med. was changed , pt. has a sleep number bed & he reports the score averages in the 80% range    PSA elevation    History of    Recurrent sinus infections    related to fracture nose x2, also reports that he has a deviated septum   Past Surgical History:  Procedure Laterality Date   COLONOSCOPY  2008   3 internal hemorrhoids, diverticulosis;Dr.Jacobs   INSERTION OF MESH N/A 11/13/2016   Procedure: INSERTION OF MESH;  Surgeon: Rubin Calamity, MD;  Location: [facility] OR;  Service: General;  Laterality: N/A;   LAPAROSCOPIC INGUINAL HERNIA WITH UMBILICAL HERNIA Left 11/13/2016   Procedure: LAPAROSCOPIC LEFT INGUINAL HERNIA  AND UMBILICAL HERNIA REPAIRS WITH MESH;  Surgeon: Rubin Calamity, MD;  Location: [facility] OR;  Service: General;  Laterality: Left;   PROSTATE BIOPSY     x 2, once abnormal, once normal   TONSILLECTOMY     Patient Active Problem List   Diagnosis Date Noted   Right knee meniscal tear 06/01/2015   Obstructive sleep apnea 02/07/2015   Thoracic spine pain 03/29/2014   Tenosynovitis of thumb 03/29/2014   Cervical radiculopathy due to degenerative joint  disease of spine 10/10/2013   Bursitis of left shoulder 09/23/2013   Chronic low back pain 12/15/2012   Chronic cervical pain 06/09/2012   Hyperlipidemia 07/06/2010   Essential hypertension 11/03/2009   Inguinal hernia 11/03/2009   Nonallergic rhinitis 08/24/2009   Ventral hernia 05/20/2009   Diverticulosis of colon 05/20/2009   Benign neoplasm of skin 10/26/2008   GERD 10/01/2006    PCP: Merna Huxley NP  REFERRING PROVIDER: Joane Birmingham, MD  REFERRING DIAG: lumbar radiculitis  THERAPY DIAG:  Difficulty in walking, not elsewhere classified  Pain in left lower leg  Pain in right lower leg  Rationale for Evaluation and Treatment: Rehabilitation  ONSET DATE: 6 months march 2025  SUBJECTIVE:   SUBJECTIVE STATEMENT: Bed just doesn't work for exercises, too soft. Seated HS stretch helps. MRI 10/9  EVAL:  For 8 months I walked 30 min, every day, now something has changed and I just can't do it , it feels like my legs are logs or full of water  PERTINENT HISTORY: Reports wakes up every morning with pain B lower legs, ant and lateral lower legs, also same pain pattern with walking .  Referred by sports med MD  PAIN:  Are you having pain?  2/10. Afternoon better than morning  PRECAUTIONS: None  RED FLAGS: None   WEIGHT BEARING RESTRICTIONS: No  FALLS:  Has patient fallen in last 6 months? No  LIVING ENVIRONMENT: Lives with: lives alone Lives in: House/apartment Stairs: No Has following equipment at home: Single point cane  OCCUPATION: retired, does foster cats  PLOF: Independent  PATIENT GOALS: be able to walk 30 min to get my activity level also wake up without pain  NEXT MD VISIT: 09/26/23  OBJECTIVE:  Note: Objective measures were completed at Evaluation unless otherwise noted.  DIAGNOSTIC FINDINGS: x rays with arthritic changes L5/S1  PATIENT SURVEYS:  PSFS: THE PATIENT SPECIFIC FUNCTIONAL SCALE  Place score of 0-10 (0 = unable to perform activity  and 10 = able to perform activity at the same level as before injury or problem)  Activity Date: 09/25/23    Walk greater than 20 min 2    2.stand from sleeping/lying down position  0    3.ascend/ descend 4 steps  0         Total Score 2      Total Score = Sum of activity scores/number of activities  Minimally Detectable Change: 3 points (for single activity); 2 points (for average score)  [name] (nd). The Patient Specific Functional Scale . Retrieved from [email or web address]   COGNITION: Overall cognitive status: Within functional limits for tasks assessed     SENSATION: WFL  EDEMA:  None noted  POSTURE: loss of lumbar lordosis, Er B hips, post pelvic tilt in standing and with gait  PALPATION: Non tender over post hips, tender with PA glide L2/3 Non tender B lower legs, ant, post musculature  LOWER EXTREMITY ROM:wfl B LUMBAR ROM: flex wnl, ext 50%, sb R with pain central lumbar, L sb wnl  LOWER EXTREMITY MMT:  MMT Right eval Left eval  Hip flexion wfl wfl  Hip extension    Hip abduction wfl wfl  Hip adduction    Hip internal rotation    Hip external rotation    Knee flexion    Knee extension    Ankle dorsiflexion    Ankle plantarflexion toe walking 4 3-  Ankle inversion    Ankle eversion     (Blank rows = not tested)  LOWER EXTREMITY SPECIAL TESTS:  na  FUNCTIONAL TESTS:  30 sec sit to sit 13 reps    GAIT: Distance walked: 26' in clinic, no deficits noted, no device                                                                                                                               TREATMENT DATE:   10/08/23 Nustep L 5 Slant board 3 x 20 s Black bar heel raises and toe raises 20 x each Leg press 30# 2 sets 10 Isometric abdominals 10 x hold 5 sec STS with wt ball 3 sets 5, yellow ball chest press 20# lats and rows  2 sets 10 Black tband trunk flex and ext 20  x Passive stretching LE and trunk   10/03/23 Nustep level 5 x 5 minutes Reviewed HEP gave cues Gait outside around the back building and then to our front door, took 7 minutes and wsa tired in the legs, no extra pain  STS 2x 5 with weighted ball 20# lats 2x10 20# rows 2x10 Slant board stretch 3 x20 s LEg press 20# 2x10 Back to wall postural alignment and trying to hold 15-20 seconds Feet on ball K2C, rotation, bridge, isometric abs Passive LE stretch  09/27/23 Nustep level 5 x 6 minutes 20# Row 20# Lats Slant board stretch REviewed and performed HEP with him, needed a lot of cues due to his tightness Passive HS stretch a lot of cues needed to relax, very very tight Passive piriformis stretch Feet on ball K2C, rotation, posterior activation, isometric abs Added HEP and performed  09/26/23  Eval, education in anatomy of spine, positioning to decompress lumbar spine,  Education in sleeping position to reduce excessive side bending lumbar Inst in 2 ex indicated below to improve lumbar spine mobility and L ankle plantarflexion strength  PATIENT EDUCATION:  Education details: POC, goals Person educated: Patient Education method: Explanation, Demonstration, Tactile cues, Verbal cues, and Handouts Education comprehension: verbalized understanding  HOME EXERCISE PROGRAM: Access Code: [email or web address] URL: [email or web address] Date: 09/25/2023 Prepared by: Amy Speaks  Exercises - Supine Lower Trunk Rotation  - 1 x daily - 7 x weekly - 3 sets - 10 reps - Long Sitting Ankle Plantar Flexion with Resistance  - 1 x daily - 7 x weekly - 3 sets - 10 reps Access Code: [email or web address] URL: [email or web address] Date: 09/27/2023 Prepared by: Ozell Mainland  Exercises - Hooklying Single Knee to Chest Stretch  - 2 x daily - 7 x weekly - 1 sets - 10 reps - 5 hold - Seated Table Hamstring Stretch  - 2 x daily - 7 x weekly - 1 sets - 5 reps - 30 hold - Seated Hamstring  Stretch  - 2 x daily - 7 x weekly - 1 sets - 5 reps - 30 hold - Seated Hamstring Stretch with Chair  - 2 x daily - 7 x weekly - 1 sets - 5 reps - 30 hold - Supine Piriformis Stretch Pulling Heel to Hip  - 2 x daily - 7 x weekly - 1 sets - 5 reps - 30 hold ASSESSMENT:  CLINICAL IMPRESSION: Patient is extremely tight in the calves and HS, states seated stretch is better. His posture is very poor and had difficulty trying to correct and hold so needs cuing. Progressed ex and talked about progressing to some ex in apartment gym.   Patient is a 83 y.o. male who was evaluated today by physical therapy due to onset of B lower leg pain and reduced ability to walk. Referred by sport medicine MD.  Minimal back pain noted by pt, but he does have marked weakness L plantarflexors, and some provocation of lower back pain with R side bending.  He should benefit from physical therapy to address his pain and improve his activity tolerance, sleeping comfort.. primarily needs activities to reduce compression lumbar region  OBJECTIVE IMPAIRMENTS: decreased balance, decreased endurance, decreased mobility, difficulty walking, decreased strength, hypomobility, and pain.   ACTIVITY LIMITATIONS: sleeping, stairs, transfers, bed mobility, and locomotion level  PARTICIPATION LIMITATIONS: laundry, shopping, and community activity  PERSONAL FACTORS: Age, Behavior pattern, Fitness, Time since onset of injury/illness/exacerbation, and 1 comorbidity:  cervical radiculopathy are also affecting patient's functional outcome.   REHAB POTENTIAL: Good  CLINICAL DECISION MAKING: Evolving/moderate complexity  EVALUATION COMPLEXITY: Moderate   GOALS: Goals reviewed with patient? Yes  SHORT TERM GOALS: Target date: 2 weeks ,10/09/23 I HEP Baseline: Goal status:progressing 10/03/23   LONG TERM GOALS: Target date: 8 weeks 11/21/23  Able to walk 30 min 3 to 5 x week without interference from B lower leg pain Baseline:  Goal  status: INITIAL  2.  Improve B plantarflexor strength L from 3+/5  and R form 4/5 to 5/5 for improved balance Baseline:  Goal status: INITIAL  3.  Improve lumbar side bending to R to wnl without provocation of Sx Baseline:  Goal status: INITIAL   PLAN:  PT FREQUENCY: 2x/week  PT DURATION: 8 weeks  PLANNED INTERVENTIONS: 97110-Therapeutic exercises, 97530- Therapeutic activity, [id number]- Neuromuscular re-education, 97535- Self Care, 02859- Manual therapy, G0283- Electrical stimulation (unattended), 97035- Ultrasound, 02987- Traction (mechanical), and Patient/Family education  PLAN FOR NEXT SESSION: activities to improve B ankle strength, stretching Lumbar region   Dora Simeone,ANGIE, PTA, 10/08/2023, 12:28 PM

## 2023-10-10 ENCOUNTER — Ambulatory Visit
Admission: RE | Admit: 2023-10-10 | Discharge: 2023-10-10 | Disposition: A | Source: Ambulatory Visit | Attending: Family Medicine | Admitting: Family Medicine

## 2023-10-10 DIAGNOSIS — M47817 Spondylosis without myelopathy or radiculopathy, lumbosacral region: Secondary | ICD-10-CM | POA: Diagnosis not present

## 2023-10-10 DIAGNOSIS — M5127 Other intervertebral disc displacement, lumbosacral region: Secondary | ICD-10-CM | POA: Diagnosis not present

## 2023-10-10 DIAGNOSIS — M79604 Pain in right leg: Secondary | ICD-10-CM

## 2023-10-10 DIAGNOSIS — M5416 Radiculopathy, lumbar region: Secondary | ICD-10-CM

## 2023-10-14 ENCOUNTER — Ambulatory Visit: Payer: Self-pay | Admitting: Family Medicine

## 2023-10-14 DIAGNOSIS — M5416 Radiculopathy, lumbar region: Secondary | ICD-10-CM

## 2023-10-14 NOTE — Progress Notes (Signed)
 MRI shows areas of pinched nerve in your back and that is causing the leg pain.  I have ordered an epidural steroid injection.  You should hear soon about scheduling a back injection with [facility] imaging.

## 2023-10-15 ENCOUNTER — Ambulatory Visit: Admitting: Physical Therapy

## 2023-10-15 DIAGNOSIS — R262 Difficulty in walking, not elsewhere classified: Secondary | ICD-10-CM

## 2023-10-15 DIAGNOSIS — M79661 Pain in right lower leg: Secondary | ICD-10-CM

## 2023-10-15 DIAGNOSIS — M79662 Pain in left lower leg: Secondary | ICD-10-CM | POA: Diagnosis not present

## 2023-10-15 NOTE — Therapy (Addendum)
 OUTPATIENT PHYSICAL THERAPY LOWER EXTREMITY EVALUATION   Patient Name: Todd Larson MRN: [id number] DOB:11-06-1940, 83 y.o., male Today's Date: 10/15/2023  END OF SESSION:  PT End of Session - 10/15/23 1003     Visit Number 5    Date for Recertification  11/21/23    Authorization Type Medicare    PT Start Time 1005    PT Stop Time 1050    PT Time Calculation (min) 45 min          Past Medical History:  Diagnosis Date   Arthritis    cervical spine - T7-8 & lumbar area - bone spurs up & down the back    Bulging discs     thoracic spine   Diverticulosis of colon    GERD (gastroesophageal reflux disease)    related to intestinal system, - has started Align  4-5 days ago-    Hyperlipidemia    Hypertension    Inguinal hernia    OSA (obstructive sleep apnea)    saw Dr. Neysa- 02/2015- pt. remarks that as a result BP med. was changed , pt. has a sleep number bed & he reports the score averages in the 80% range    PSA elevation    History of    Recurrent sinus infections    related to fracture nose x2, also reports that he has a deviated septum   Past Surgical History:  Procedure Laterality Date   COLONOSCOPY  2008   3 internal hemorrhoids, diverticulosis;Dr.Jacobs   INSERTION OF MESH N/A 11/13/2016   Procedure: INSERTION OF MESH;  Surgeon: Rubin Calamity, MD;  Location: [facility] OR;  Service: General;  Laterality: N/A;   LAPAROSCOPIC INGUINAL HERNIA WITH UMBILICAL HERNIA Left 11/13/2016   Procedure: LAPAROSCOPIC LEFT INGUINAL HERNIA  AND UMBILICAL HERNIA REPAIRS WITH MESH;  Surgeon: Rubin Calamity, MD;  Location: [facility] OR;  Service: General;  Laterality: Left;   PROSTATE BIOPSY     x 2, once abnormal, once normal   TONSILLECTOMY     Patient Active Problem List   Diagnosis Date Noted   Right knee meniscal tear 06/01/2015   Obstructive sleep apnea 02/07/2015   Thoracic spine pain 03/29/2014   Tenosynovitis of thumb 03/29/2014   Cervical radiculopathy due to degenerative  joint disease of spine 10/10/2013   Bursitis of left shoulder 09/23/2013   Chronic low back pain 12/15/2012   Chronic cervical pain 06/09/2012   Hyperlipidemia 07/06/2010   Essential hypertension 11/03/2009   Inguinal hernia 11/03/2009   Nonallergic rhinitis 08/24/2009   Ventral hernia 05/20/2009   Diverticulosis of colon 05/20/2009   Benign neoplasm of skin 10/26/2008   GERD 10/01/2006    PCP: Merna Huxley NP  REFERRING PROVIDER: Joane Birmingham, MD  REFERRING DIAG: lumbar radiculitis  THERAPY DIAG:  Difficulty in walking, not elsewhere classified  Pain in left lower leg  Pain in right lower leg  Rationale for Evaluation and Treatment: Rehabilitation  ONSET DATE: 6 months march 2025  SUBJECTIVE:   SUBJECTIVE STATEMENT:  walked 24 min yesterday ( 35 min mile) walking longer but not same pace ( previously 24 min mile pace ) Awaiting to get back injection per MD pinched nerve. Thigh pain left > RT . Nights worse. Only stretching ex at home that works on seated HS stretch.    Bed just doesn't work for exercises, too soft. Seated HS stretch helps. MRI 10/9  EVAL:  For 8 months I walked 30 min, every day, now something has changed and I just  can't do it , it feels like my legs are logs or full of water  PERTINENT HISTORY: Reports wakes up every morning with pain B lower legs, ant and lateral lower legs, also same pain pattern with walking .  Referred by sports med MD  PAIN:  Are you having pain?  2/10.last night 5/10  PRECAUTIONS: None  RED FLAGS: None   WEIGHT BEARING RESTRICTIONS: No  FALLS:  Has patient fallen in last 6 months? No  LIVING ENVIRONMENT: Lives with: lives alone Lives in: House/apartment Stairs: No Has following equipment at home: Single point cane  OCCUPATION: retired, does foster cats  PLOF: Independent  PATIENT GOALS: be able to walk 30 min to get my activity level also wake up without pain  NEXT MD VISIT: 09/26/23  OBJECTIVE:  Note:  Objective measures were completed at Evaluation unless otherwise noted.  DIAGNOSTIC FINDINGS: x rays with arthritic changes L5/S1  PATIENT SURVEYS:  PSFS: THE PATIENT SPECIFIC FUNCTIONAL SCALE  Place score of 0-10 (0 = unable to perform activity and 10 = able to perform activity at the same level as before injury or problem)  Activity Date: 09/25/23    Walk greater than 20 min 2    2.stand from sleeping/lying down position  0    3.ascend/ descend 4 steps  0         Total Score 2      Total Score = Sum of activity scores/number of activities  Minimally Detectable Change: 3 points (for single activity); 2 points (for average score)  [name] (nd). The Patient Specific Functional Scale . Retrieved from [email or web address]   COGNITION: Overall cognitive status: Within functional limits for tasks assessed     SENSATION: WFL  EDEMA:  None noted  POSTURE: loss of lumbar lordosis, Er B hips, post pelvic tilt in standing and with gait  PALPATION: Non tender over post hips, tender with PA glide L2/3 Non tender B lower legs, ant, post musculature  LOWER EXTREMITY ROM:wfl B LUMBAR ROM: flex wnl, ext 50%, sb R with pain central lumbar, L sb wnl  LOWER EXTREMITY MMT:  MMT Right eval Left eval  Hip flexion wfl wfl  Hip extension    Hip abduction wfl wfl  Hip adduction    Hip internal rotation    Hip external rotation    Knee flexion    Knee extension    Ankle dorsiflexion    Ankle plantarflexion toe walking 4 3-  Ankle inversion    Ankle eversion     (Blank rows = not tested)  LOWER EXTREMITY SPECIAL TESTS:  na  FUNCTIONAL TESTS:  30 sec sit to sit 13 reps    GAIT: Distance walked: 34' in clinic, no deficits noted, no device                                                                                                                               TREATMENT  DATE:   10/15/23 Nustep L 5  Standing HS stretch 20 sec 2 x BIL Standing step calf stretch 20 sec 3 x Black bar heel raises and toe raises 20 x 25# lat pull down and seated row 2 set 10 Black tband trunk flex and ext 20 x Wt ball trunk ext 10 x, trunk rotation 10 x each way Standing red tband hip ext and abd 2 sets 10- c/o increased increased pain with SLS Feet on ball bridge, KTC,SLR,obl and iso abdominals PROM LE and trunk   10/08/23 Nustep L 5 Slant board 3 x 20 s Black bar heel raises and toe raises 20 x each Leg press 30# 2 sets 10 Isometric abdominals 10 x hold 5 sec STS with wt ball 3 sets 5, yellow ball chest press 20# lats and rows 2 sets 10 Black tband trunk flex and ext 20 x Passive stretching LE and trunk   10/03/23 Nustep level 5 x 5 minutes Reviewed HEP gave cues Gait outside around the back building and then to our front door, took 7 minutes and wsa tired in the legs, no extra pain  STS 2x 5 with weighted ball 20# lats 2x10 20# rows 2x10 Slant board stretch 3 x20 s LEg press 20# 2x10 Back to wall postural alignment and trying to hold 15-20 seconds Feet on ball K2C, rotation, bridge, isometric abs Passive LE stretch  09/27/23 Nustep level 5 x 6 minutes 20# Row 20# Lats Slant board stretch REviewed and performed HEP with him, needed a lot of cues due to his tightness Passive HS stretch a lot of cues needed to relax, very very tight Passive piriformis stretch Feet on ball K2C, rotation, posterior activation, isometric abs Added HEP and performed  09/26/23  Eval, education in anatomy of spine, positioning to decompress lumbar spine,  Education in sleeping position to reduce excessive side bending lumbar Inst in 2 ex indicated below to improve lumbar spine mobility and L ankle plantarflexion strength  PATIENT EDUCATION:  Education details: POC, goals Person educated: Patient Education method: Explanation, Demonstration, Tactile cues, Verbal cues, and Handouts Education  comprehension: verbalized understanding  HOME EXERCISE PROGRAM: Access Code: [email or web address] URL: [email or web address] Date: 09/25/2023 Prepared by: Amy Speaks  Exercises - Supine Lower Trunk Rotation  - 1 x daily - 7 x weekly - 3 sets - 10 reps - Long Sitting Ankle Plantar Flexion with Resistance  - 1 x daily - 7 x weekly - 3 sets - 10 reps Access Code: [email or web address] URL: [email or web address] Date: 09/27/2023 Prepared by: Ozell Mainland  Exercises - Hooklying Single Knee to Chest Stretch  - 2 x daily - 7 x weekly - 1 sets - 10 reps - 5 hold - Seated Table Hamstring Stretch  - 2 x daily - 7 x weekly - 1 sets - 5 reps - 30 hold - Seated Hamstring Stretch  - 2 x daily - 7 x weekly - 1 sets - 5 reps - 30 hold - Seated Hamstring Stretch with Chair  - 2 x daily - 7 x weekly - 1 sets - 5 reps - 30 hold - Supine Piriformis Stretch Pulling Heel to Hip  - 2 x daily - 7 x weekly - 1 sets - 5 reps - 30 hold ASSESSMENT:  CLINICAL IMPRESSION: walked 24 min yesterday ( 35 min mile) walking longer but not same pace ( previously 24 min mile pace ) Awaiting to get back injection per MD pinched nerve. Educ on need to still strengthen  and stretch with and after injection. Injection to decrease inflammation and stop radiating pain.Thigh pain left > RT . Nights worse. Only stretching ex at home that works on seated HS stretch.- showed standing modification. Postural cuing needed throughout ex . Instability with SLS hip ex    OBJECTIVE IMPAIRMENTS: decreased balance, decreased endurance, decreased mobility, difficulty walking, decreased strength, hypomobility, and pain.   ACTIVITY LIMITATIONS: sleeping, stairs, transfers, bed mobility, and locomotion level  PARTICIPATION LIMITATIONS: laundry, shopping, and community activity  PERSONAL FACTORS: Age, Behavior pattern, Fitness, Time since onset of injury/illness/exacerbation, and 1 comorbidity: cervical radiculopathy are also  affecting patient's functional outcome.   REHAB POTENTIAL: Good  CLINICAL DECISION MAKING: Evolving/moderate complexity  EVALUATION COMPLEXITY: Moderate   GOALS: Goals reviewed with patient? Yes  SHORT TERM GOALS: Target date: 2 weeks ,10/09/23 I HEP Baseline: Goal status:progressing 10/03/23   LONG TERM GOALS: Target date: 8 weeks 11/21/23  Able to walk 30 min 3 to 5 x week without interference from B lower leg pain Baseline:  Goal status: INITIAL  2.  Improve B plantarflexor strength L from 3+/5  and R form 4/5 to 5/5 for improved balance Baseline:  Goal status: INITIAL  3.  Improve lumbar side bending to R to wnl without provocation of Sx Baseline:  Goal status: INITIAL   PLAN:  PT FREQUENCY: 2x/week  PT DURATION: 8 weeks  PLANNED INTERVENTIONS: 97110-Therapeutic exercises, 97530- Therapeutic activity, [id number]- Neuromuscular re-education, 97535- Self Care, 02859- Manual therapy, [id number]- Electrical stimulation (unattended), 97035- Ultrasound, 02987- Traction (mechanical), and Patient/Family education  PLAN FOR NEXT SESSION: activities to improve B LE  strength, stretching Lumbar region and LE   Cordaryl Decelles,ANGIE, PTA, 10/15/2023, 10:05 AM

## 2023-10-17 ENCOUNTER — Encounter: Payer: Self-pay | Admitting: Physical Therapy

## 2023-10-17 ENCOUNTER — Ambulatory Visit: Admitting: Physical Therapy

## 2023-10-17 DIAGNOSIS — M79661 Pain in right lower leg: Secondary | ICD-10-CM

## 2023-10-17 DIAGNOSIS — M79662 Pain in left lower leg: Secondary | ICD-10-CM | POA: Diagnosis not present

## 2023-10-17 DIAGNOSIS — Z23 Encounter for immunization: Secondary | ICD-10-CM | POA: Diagnosis not present

## 2023-10-17 DIAGNOSIS — R262 Difficulty in walking, not elsewhere classified: Secondary | ICD-10-CM

## 2023-10-17 NOTE — Therapy (Signed)
 OUTPATIENT PHYSICAL THERAPY LOWER EXTREMITY EVALUATION   Patient Name: Todd Larson MRN: [id number] DOB:14-Sep-1940, 83 y.o., male Today's Date: 10/17/2023  END OF SESSION:  PT End of Session - 10/17/23 1013     Visit Number 6    Date for Recertification  11/21/23    PT Start Time 1015    PT Stop Time 1100    PT Time Calculation (min) 45 min    Activity Tolerance Patient tolerated treatment well    Behavior During Therapy WFL for tasks assessed/performed          Past Medical History:  Diagnosis Date   Arthritis    cervical spine - T7-8 & lumbar area - bone spurs up & down the back    Bulging discs     thoracic spine   Diverticulosis of colon    GERD (gastroesophageal reflux disease)    related to intestinal system, - has started Align  4-5 days ago-    Hyperlipidemia    Hypertension    Inguinal hernia    OSA (obstructive sleep apnea)    saw Dr. Neysa- 02/2015- pt. remarks that as a result BP med. was changed , pt. has a sleep number bed & he reports the score averages in the 80% range    PSA elevation    History of    Recurrent sinus infections    related to fracture nose x2, also reports that he has a deviated septum   Past Surgical History:  Procedure Laterality Date   COLONOSCOPY  2008   3 internal hemorrhoids, diverticulosis;Dr.Jacobs   INSERTION OF MESH N/A 11/13/2016   Procedure: INSERTION OF MESH;  Surgeon: Rubin Calamity, MD;  Location: [facility] OR;  Service: General;  Laterality: N/A;   LAPAROSCOPIC INGUINAL HERNIA WITH UMBILICAL HERNIA Left 11/13/2016   Procedure: LAPAROSCOPIC LEFT INGUINAL HERNIA  AND UMBILICAL HERNIA REPAIRS WITH MESH;  Surgeon: Rubin Calamity, MD;  Location: [facility] OR;  Service: General;  Laterality: Left;   PROSTATE BIOPSY     x 2, once abnormal, once normal   TONSILLECTOMY     Patient Active Problem List   Diagnosis Date Noted   Right knee meniscal tear 06/01/2015   Obstructive sleep apnea 02/07/2015   Thoracic spine pain 03/29/2014    Tenosynovitis of thumb 03/29/2014   Cervical radiculopathy due to degenerative joint disease of spine 10/10/2013   Bursitis of left shoulder 09/23/2013   Chronic low back pain 12/15/2012   Chronic cervical pain 06/09/2012   Hyperlipidemia 07/06/2010   Essential hypertension 11/03/2009   Inguinal hernia 11/03/2009   Nonallergic rhinitis 08/24/2009   Ventral hernia 05/20/2009   Diverticulosis of colon 05/20/2009   Benign neoplasm of skin 10/26/2008   GERD 10/01/2006    PCP: Merna Huxley NP  REFERRING PROVIDER: Joane Birmingham, MD  REFERRING DIAG: lumbar radiculitis  THERAPY DIAG:  Difficulty in walking, not elsewhere classified  Pain in left lower leg  Pain in right lower leg  Rationale for Evaluation and Treatment: Rehabilitation  ONSET DATE: 6 months march 2025  SUBJECTIVE:   SUBJECTIVE STATEMENT:  Overall not bad   walked 24 min yesterday ( 35 min mile) walking longer but not same pace ( previously 24 min mile pace ) Awaiting to get back injection per MD pinched nerve. Thigh pain left > RT . Nights worse. Only stretching ex at home that works on seated HS stretch.    Bed just doesn't work for exercises, too soft. Seated HS stretch helps. MRI 10/9  EVAL:  For 8 months I walked 30 min, every day, now something has changed and I just can't do it , it feels like my legs are logs or full of water  PERTINENT HISTORY: Reports wakes up every morning with pain B lower legs, ant and lateral lower legs, also same pain pattern with walking .  Referred by sports med MD  PAIN:  Are you having pain?  3/10 L distal leg  PRECAUTIONS: None  RED FLAGS: None   WEIGHT BEARING RESTRICTIONS: No  FALLS:  Has patient fallen in last 6 months? No  LIVING ENVIRONMENT: Lives with: lives alone Lives in: House/apartment Stairs: No Has following equipment at home: Single point cane  OCCUPATION: retired, does foster cats  PLOF: Independent  PATIENT GOALS: be able to walk 30 min  to get my activity level also wake up without pain  NEXT MD VISIT: 09/26/23  OBJECTIVE:  Note: Objective measures were completed at Evaluation unless otherwise noted.  DIAGNOSTIC FINDINGS: x rays with arthritic changes L5/S1  PATIENT SURVEYS:  PSFS: THE PATIENT SPECIFIC FUNCTIONAL SCALE  Place score of 0-10 (0 = unable to perform activity and 10 = able to perform activity at the same level as before injury or problem)  Activity Date: 09/25/23    Walk greater than 20 min 2    2.stand from sleeping/lying down position  0    3.ascend/ descend 4 steps  0         Total Score 2      Total Score = Sum of activity scores/number of activities  Minimally Detectable Change: 3 points (for single activity); 2 points (for average score)  [name] (nd). The Patient Specific Functional Scale . Retrieved from [email or web address]   COGNITION: Overall cognitive status: Within functional limits for tasks assessed     SENSATION: WFL  EDEMA:  None noted  POSTURE: loss of lumbar lordosis, Er B hips, post pelvic tilt in standing and with gait  PALPATION: Non tender over post hips, tender with PA glide L2/3 Non tender B lower legs, ant, post musculature  LOWER EXTREMITY ROM:wfl B LUMBAR ROM: flex wnl, ext 50%, sb R with pain central lumbar, L sb wnl  LOWER EXTREMITY MMT:  MMT Right eval Left eval  Hip flexion wfl wfl  Hip extension    Hip abduction wfl wfl  Hip adduction    Hip internal rotation    Hip external rotation    Knee flexion    Knee extension    Ankle dorsiflexion    Ankle plantarflexion toe walking 4 3-  Ankle inversion    Ankle eversion     (Blank rows = not tested)  LOWER EXTREMITY SPECIAL TESTS:  na  FUNCTIONAL TESTS:  30 sec sit to sit 13 reps    GAIT: Distance walked: 12' in clinic, no deficits noted, no device  TREATMENT DATE:  10/17/23 Nustep L 5 Gait around the back building around widest part of parking lor Seated Rows & Lats 35lb 2x12 Shoulder Ext 10lb 2x10 Lumbar ROM  WFL  S2S OHP yellow ball 2x10 Bridges x10 Feet on ball bridge, KTC,SLR,obl and iso abdominals PROM LE and trunk w/ end range holds   10/15/23 Nustep L 5 Standing HS stretch 20 sec 2 x BIL Standing step calf stretch 20 sec 3 x Black bar heel raises and toe raises 20 x 25# lat pull down and seated row 2 set 10 Black tband trunk flex and ext 20 x Wt ball trunk ext 10 x, trunk rotation 10 x each way Standing red tband hip ext and abd 2 sets 10- c/o increased increased pain with SLS Feet on ball bridge, KTC,SLR,obl and iso abdominals PROM LE and trunk   10/08/23 Nustep L 5 Slant board 3 x 20 s Black bar heel raises and toe raises 20 x each Leg press 30# 2 sets 10 Isometric abdominals 10 x hold 5 sec STS with wt ball 3 sets 5, yellow ball chest press 20# lats and rows 2 sets 10 Black tband trunk flex and ext 20 x Passive stretching LE and trunk   10/03/23 Nustep level 5 x 5 minutes Reviewed HEP gave cues Gait outside around the back building and then to our front door, took 7 minutes and wsa tired in the legs, no extra pain  STS 2x 5 with weighted ball 20# lats 2x10 20# rows 2x10 Slant board stretch 3 x20 s LEg press 20# 2x10 Back to wall postural alignment and trying to hold 15-20 seconds Feet on ball K2C, rotation, bridge, isometric abs Passive LE stretch  09/27/23 Nustep level 5 x 6 minutes 20# Row 20# Lats Slant board stretch REviewed and performed HEP with him, needed a lot of cues due to his tightness Passive HS stretch a lot of cues needed to relax, very very tight Passive piriformis stretch Feet on ball K2C, rotation, posterior activation, isometric abs Added HEP and performed  09/26/23  Eval, education in anatomy of spine,  positioning to decompress lumbar spine,  Education in sleeping position to reduce excessive side bending lumbar Inst in 2 ex indicated below to improve lumbar spine mobility and L ankle plantarflexion strength  PATIENT EDUCATION:  Education details: POC, goals Person educated: Patient Education method: Explanation, Demonstration, Tactile cues, Verbal cues, and Handouts Education comprehension: verbalized understanding  HOME EXERCISE PROGRAM: Access Code: [email or web address] URL: [email or web address] Date: 09/25/2023 Prepared by: Amy Speaks  Exercises - Supine Lower Trunk Rotation  - 1 x daily - 7 x weekly - 3 sets - 10 reps - Long Sitting Ankle Plantar Flexion with Resistance  - 1 x daily - 7 x weekly - 3 sets - 10 reps Access Code: [email or web address] URL: [email or web address] Date: 09/27/2023 Prepared by: Ozell Mainland  Exercises - Hooklying Single Knee to Chest Stretch  - 2 x daily - 7 x weekly - 1 sets - 10 reps - 5 hold - Seated Table Hamstring Stretch  - 2 x daily - 7 x weekly - 1 sets - 5 reps - 30 hold - Seated Hamstring Stretch  - 2 x daily - 7 x weekly - 1 sets - 5 reps - 30 hold - Seated Hamstring Stretch with Chair  - 2 x daily - 7 x weekly - 1 sets - 5 reps - 30 hold - Supine Piriformis Stretch Pulling Heel to Hip  - 2  x daily - 7 x weekly - 1 sets - 5 reps - 30 hold ASSESSMENT:  CLINICAL IMPRESSION:  Pt awaiting to get back injection per MD pinched nerve. Advanced to outdoor ambulation, pace decreases as pt fatigues. HE wished to return to his 24 min mile pace, not he reports 28 min miles Postural cuing needed throughout ex. Bilateral HS tightness with stretching. Progressing towards goals.  OBJECTIVE IMPAIRMENTS: decreased balance, decreased endurance, decreased mobility, difficulty walking, decreased strength, hypomobility, and pain.   ACTIVITY LIMITATIONS: sleeping, stairs, transfers, bed mobility, and locomotion level  PARTICIPATION LIMITATIONS:  laundry, shopping, and community activity  PERSONAL FACTORS: Age, Behavior pattern, Fitness, Time since onset of injury/illness/exacerbation, and 1 comorbidity: cervical radiculopathy are also affecting patient's functional outcome.   REHAB POTENTIAL: Good  CLINICAL DECISION MAKING: Evolving/moderate complexity  EVALUATION COMPLEXITY: Moderate   GOALS: Goals reviewed with patient? Yes  SHORT TERM GOALS: Target date: 2 weeks ,10/09/23 I HEP Baseline: Goal status:progressing 10/03/23   LONG TERM GOALS: Target date: 8 weeks 11/21/23  Able to walk 30 min 3 to 5 x week without interference from B lower leg pain Baseline:  Goal status: Progressing 15-20 min a day 10/17/23  2.  Improve B plantarflexor strength L from 3+/5  and R form 4/5 to 5/5 for improved balance Baseline:  Goal status: INITIAL  3.  Improve lumbar side bending to R to wnl without provocation of Sx Baseline:  Goal status: 10/17/23 Met    PLAN:  PT FREQUENCY: 2x/week  PT DURATION: 8 weeks  PLANNED INTERVENTIONS: 97110-Therapeutic exercises, 97530- Therapeutic activity, 97112- Neuromuscular re-education, 97535- Self Care, 02859- Manual therapy, [id number]- Electrical stimulation (unattended), 97035- Ultrasound, 02987- Traction (mechanical), and Patient/Family education  PLAN FOR NEXT SESSION: activities to improve B LE  strength, stretching Lumbar region and LE   Tanda KANDICE Sorrow, PTA, 10/17/2023, 10:14 AM

## 2023-10-21 NOTE — Discharge Instructions (Signed)

## 2023-10-22 ENCOUNTER — Inpatient Hospital Stay: Admission: RE | Admit: 2023-10-22 | Discharge: 2023-10-22 | Attending: Family Medicine | Admitting: Family Medicine

## 2023-10-22 ENCOUNTER — Encounter: Payer: Self-pay | Admitting: Physical Therapy

## 2023-10-22 ENCOUNTER — Ambulatory Visit: Admitting: Physical Therapy

## 2023-10-22 DIAGNOSIS — M79661 Pain in right lower leg: Secondary | ICD-10-CM

## 2023-10-22 DIAGNOSIS — M79662 Pain in left lower leg: Secondary | ICD-10-CM | POA: Diagnosis not present

## 2023-10-22 DIAGNOSIS — M5416 Radiculopathy, lumbar region: Secondary | ICD-10-CM

## 2023-10-22 DIAGNOSIS — R262 Difficulty in walking, not elsewhere classified: Secondary | ICD-10-CM

## 2023-10-22 DIAGNOSIS — M545 Low back pain, unspecified: Secondary | ICD-10-CM | POA: Diagnosis not present

## 2023-10-22 MED ORDER — METHYLPREDNISOLONE ACETATE 40 MG/ML INJ SUSP (RADIOLOG
80.0000 mg | Freq: Once | INTRAMUSCULAR | Status: AC
  Administered 2023-10-22: 80 mg via EPIDURAL

## 2023-10-22 MED ORDER — IOPAMIDOL (ISOVUE-M 200) INJECTION 41%
1.0000 mL | Freq: Once | INTRAMUSCULAR | Status: AC
  Administered 2023-10-22: 1 mL via EPIDURAL

## 2023-10-22 NOTE — Therapy (Signed)
 OUTPATIENT PHYSICAL THERAPY LOWER EXTREMITY EVALUATION   Patient Name: DNAIEL VOLLER MRN: [id number] DOB:07-09-1940, 83 y.o., male Today's Date: 10/22/2023  END OF SESSION:  PT End of Session - 10/22/23 1017     Visit Number 7    Date for Recertification  11/21/23    PT Start Time 1016    PT Stop Time 1100    PT Time Calculation (min) 44 min    Activity Tolerance Patient tolerated treatment well    Behavior During Therapy WFL for tasks assessed/performed          Past Medical History:  Diagnosis Date   Arthritis    cervical spine - T7-8 & lumbar area - bone spurs up & down the back    Bulging discs     thoracic spine   Diverticulosis of colon    GERD (gastroesophageal reflux disease)    related to intestinal system, - has started Align  4-5 days ago-    Hyperlipidemia    Hypertension    Inguinal hernia    OSA (obstructive sleep apnea)    saw Dr. Neysa- 02/2015- pt. remarks that as a result BP med. was changed , pt. has a sleep number bed & he reports the score averages in the 80% range    PSA elevation    History of    Recurrent sinus infections    related to fracture nose x2, also reports that he has a deviated septum   Past Surgical History:  Procedure Laterality Date   COLONOSCOPY  2008   3 internal hemorrhoids, diverticulosis;Dr.Jacobs   INSERTION OF MESH N/A 11/13/2016   Procedure: INSERTION OF MESH;  Surgeon: Rubin Calamity, MD;  Location: [facility] OR;  Service: General;  Laterality: N/A;   LAPAROSCOPIC INGUINAL HERNIA WITH UMBILICAL HERNIA Left 11/13/2016   Procedure: LAPAROSCOPIC LEFT INGUINAL HERNIA  AND UMBILICAL HERNIA REPAIRS WITH MESH;  Surgeon: Rubin Calamity, MD;  Location: [facility] OR;  Service: General;  Laterality: Left;   PROSTATE BIOPSY     x 2, once abnormal, once normal   TONSILLECTOMY     Patient Active Problem List   Diagnosis Date Noted   Right knee meniscal tear 06/01/2015   Obstructive sleep apnea 02/07/2015   Thoracic spine pain 03/29/2014    Tenosynovitis of thumb 03/29/2014   Cervical radiculopathy due to degenerative joint disease of spine 10/10/2013   Bursitis of left shoulder 09/23/2013   Chronic low back pain 12/15/2012   Chronic cervical pain 06/09/2012   Hyperlipidemia 07/06/2010   Essential hypertension 11/03/2009   Inguinal hernia 11/03/2009   Nonallergic rhinitis 08/24/2009   Ventral hernia 05/20/2009   Diverticulosis of colon 05/20/2009   Benign neoplasm of skin 10/26/2008   GERD 10/01/2006    PCP: Merna Huxley NP  REFERRING PROVIDER: Joane Birmingham, MD  REFERRING DIAG: lumbar radiculitis  THERAPY DIAG:  Difficulty in walking, not elsewhere classified  Pain in left lower leg  Pain in right lower leg  Rationale for Evaluation and Treatment: Rehabilitation  ONSET DATE: 6 months march 2025  SUBJECTIVE:   SUBJECTIVE STATEMENT:   No too bad today, legs were sore getting out of the bed this morning. Epidural shot today.   walked 24 min yesterday ( 35 min mile) walking longer but not same pace ( previously 24 min mile pace ) Awaiting to get back injection per MD pinched nerve. Thigh pain left > RT . Nights worse. Only stretching ex at home that works on seated HS stretch.  Bed just doesn't work for exercises, too soft. Seated HS stretch helps. MRI 10/9  EVAL:  For 8 months I walked 30 min, every day, now something has changed and I just can't do it , it feels like my legs are logs or full of water  PERTINENT HISTORY: Reports wakes up every morning with pain B lower legs, ant and lateral lower legs, also same pain pattern with walking .  Referred by sports med MD  PAIN:  Are you having pain?  3/10 L distal leg  PRECAUTIONS: None  RED FLAGS: None   WEIGHT BEARING RESTRICTIONS: No  FALLS:  Has patient fallen in last 6 months? No  LIVING ENVIRONMENT: Lives with: lives alone Lives in: House/apartment Stairs: No Has following equipment at home: Single point cane  OCCUPATION: retired,  does foster cats  PLOF: Independent  PATIENT GOALS: be able to walk 30 min to get my activity level also wake up without pain  NEXT MD VISIT: 09/26/23  OBJECTIVE:  Note: Objective measures were completed at Evaluation unless otherwise noted.  DIAGNOSTIC FINDINGS: x rays with arthritic changes L5/S1  PATIENT SURVEYS:  PSFS: THE PATIENT SPECIFIC FUNCTIONAL SCALE  Place score of 0-10 (0 = unable to perform activity and 10 = able to perform activity at the same level as before injury or problem)  Activity Date: 09/25/23    Walk greater than 20 min 2    2.stand from sleeping/lying down position  0    3.ascend/ descend 4 steps  0         Total Score 2      Total Score = Sum of activity scores/number of activities  Minimally Detectable Change: 3 points (for single activity); 2 points (for average score)  [name] (nd). The Patient Specific Functional Scale . Retrieved from [email or web address]   COGNITION: Overall cognitive status: Within functional limits for tasks assessed     SENSATION: WFL  EDEMA:  None noted  POSTURE: loss of lumbar lordosis, Er B hips, post pelvic tilt in standing and with gait  PALPATION: Non tender over post hips, tender with PA glide L2/3 Non tender B lower legs, ant, post musculature  LOWER EXTREMITY ROM:wfl B LUMBAR ROM: flex wnl, ext 50%, sb R with pain central lumbar, L sb wnl  LOWER EXTREMITY MMT:  MMT Right eval Left eval  Hip flexion wfl wfl  Hip extension    Hip abduction wfl wfl  Hip adduction    Hip internal rotation    Hip external rotation    Knee flexion    Knee extension    Ankle dorsiflexion    Ankle plantarflexion toe walking 4 3-  Ankle inversion    Ankle eversion     (Blank rows = not tested)  LOWER EXTREMITY SPECIAL TESTS:  na  FUNCTIONAL TESTS:  30 sec sit to sit 13 reps    GAIT: Distance walked: 40' in clinic, no deficits noted, no  device  TREATMENT DATE:  10/22/23 NuStep L 5 x 6 min  Shoulder ER green 2x10  Cervical retractions  Shoulder horiz abd green 2x10  Seated OHP blue ball 2x10 Sit to stand holding blue ball 2x12 Bridges x15  W/ March    Stopped after a few reps due to L HS cramping  10/17/23 Nustep L 5 Gait around the back building around widest part of parking lor Seated Rows & Lats 35lb 2x12 Shoulder Ext 10lb 2x10 Lumbar ROM  WFL  S2S OHP yellow ball 2x10 Bridges x10 Feet on ball bridge, KTC,SLR,obl and iso abdominals PROM LE and trunk w/ end range holds   10/15/23 Nustep L 5 Standing HS stretch 20 sec 2 x BIL Standing step calf stretch 20 sec 3 x Black bar heel raises and toe raises 20 x 25# lat pull down and seated row 2 set 10 Black tband trunk flex and ext 20 x Wt ball trunk ext 10 x, trunk rotation 10 x each way Standing red tband hip ext and abd 2 sets 10- c/o increased increased pain with SLS Feet on ball bridge, KTC,SLR,obl and iso abdominals PROM LE and trunk   10/08/23 Nustep L 5 Slant board 3 x 20 s Black bar heel raises and toe raises 20 x each Leg press 30# 2 sets 10 Isometric abdominals 10 x hold 5 sec STS with wt ball 3 sets 5, yellow ball chest press 20# lats and rows 2 sets 10 Black tband trunk flex and ext 20 x Passive stretching LE and trunk   10/03/23 Nustep level 5 x 5 minutes Reviewed HEP gave cues Gait outside around the back building and then to our front door, took 7 minutes and wsa tired in the legs, no extra pain  STS 2x 5 with weighted ball 20# lats 2x10 20# rows 2x10 Slant board stretch 3 x20 s LEg press 20# 2x10 Back to wall postural alignment and trying to hold 15-20 seconds Feet on ball K2C, rotation, bridge, isometric abs Passive LE stretch  09/27/23 Nustep level 5 x 6 minutes 20# Row 20#  Lats Slant board stretch REviewed and performed HEP with him, needed a lot of cues due to his tightness Passive HS stretch a lot of cues needed to relax, very very tight Passive piriformis stretch Feet on ball K2C, rotation, posterior activation, isometric abs Added HEP and performed  09/26/23  Eval, education in anatomy of spine, positioning to decompress lumbar spine,  Education in sleeping position to reduce excessive side bending lumbar Inst in 2 ex indicated below to improve lumbar spine mobility and L ankle plantarflexion strength  PATIENT EDUCATION:  Education details: POC, goals Person educated: Patient Education method: Explanation, Demonstration, Tactile cues, Verbal cues, and Handouts Education comprehension: verbalized understanding  HOME EXERCISE PROGRAM: Access Code: [email or web address] URL: [email or web address] Date: 09/25/2023 Prepared by: Amy Speaks  Exercises - Supine Lower Trunk Rotation  - 1 x daily - 7 x weekly - 3 sets - 10 reps - Long Sitting Ankle Plantar Flexion with Resistance  - 1 x daily - 7 x weekly - 3 sets - 10 reps Access Code: [email or web address] URL: [email or web address] Date: 09/27/2023 Prepared by: Ozell Mainland  Exercises - Hooklying Single Knee to Chest Stretch  - 2 x daily - 7 x weekly - 1 sets - 10 reps - 5 hold - Seated Table Hamstring Stretch  - 2 x daily - 7 x weekly - 1 sets - 5 reps - 30 hold - Seated Hamstring Stretch  -  2 x daily - 7 x weekly - 1 sets - 5 reps - 30 hold - Seated Hamstring Stretch with Chair  - 2 x daily - 7 x weekly - 1 sets - 5 reps - 30 hold - Supine Piriformis Stretch Pulling Heel to Hip  - 2 x daily - 7 x weekly - 1 sets - 5 reps - 30 hold ASSESSMENT:  CLINICAL IMPRESSION:  Injection this afternoon.  He reports decrease cervical ROM, this is due to his forward flexed posture, forward head and rounded shoulders. Postural cuing needed throughout ex. Bilateral HS tightness with stretching. Some L arm  pain reported holding blue ball with sit to stands. Will continues to work on posture.   OBJECTIVE IMPAIRMENTS: decreased balance, decreased endurance, decreased mobility, difficulty walking, decreased strength, hypomobility, and pain.   ACTIVITY LIMITATIONS: sleeping, stairs, transfers, bed mobility, and locomotion level  PARTICIPATION LIMITATIONS: laundry, shopping, and community activity  PERSONAL FACTORS: Age, Behavior pattern, Fitness, Time since onset of injury/illness/exacerbation, and 1 comorbidity: cervical radiculopathy are also affecting patient's functional outcome.   REHAB POTENTIAL: Good  CLINICAL DECISION MAKING: Evolving/moderate complexity  EVALUATION COMPLEXITY: Moderate   GOALS: Goals reviewed with patient? Yes  SHORT TERM GOALS: Target date: 2 weeks ,10/09/23 I HEP Baseline: Goal status:progressing 10/03/23   LONG TERM GOALS: Target date: 8 weeks 11/21/23  Able to walk 30 min 3 to 5 x week without interference from B lower leg pain Baseline:  Goal status: Progressing 15-20 min a day 10/17/23  2.  Improve B plantarflexor strength L from 3+/5  and R form 4/5 to 5/5 for improved balance Baseline:  Goal status: INITIAL  3.  Improve lumbar side bending to R to wnl without provocation of Sx Baseline:  Goal status: 10/17/23 Met    PLAN:  PT FREQUENCY: 2x/week  PT DURATION: 8 weeks  PLANNED INTERVENTIONS: 97110-Therapeutic exercises, 97530- Therapeutic activity, 97112- Neuromuscular re-education, 97535- Self Care, 02859- Manual therapy, G0283- Electrical stimulation (unattended), 97035- Ultrasound, 02987- Traction (mechanical), and Patient/Family education  PLAN FOR NEXT SESSION: activities to improve B LE  strength, stretching Lumbar region and LE   Tanda KANDICE Sorrow, PTA, 10/22/2023, 10:17 AM

## 2023-10-24 ENCOUNTER — Ambulatory Visit: Admitting: Physical Therapy

## 2023-10-24 ENCOUNTER — Encounter: Payer: Self-pay | Admitting: Physical Therapy

## 2023-10-24 DIAGNOSIS — M79662 Pain in left lower leg: Secondary | ICD-10-CM

## 2023-10-24 DIAGNOSIS — R262 Difficulty in walking, not elsewhere classified: Secondary | ICD-10-CM | POA: Diagnosis not present

## 2023-10-24 DIAGNOSIS — M79661 Pain in right lower leg: Secondary | ICD-10-CM | POA: Diagnosis not present

## 2023-10-24 NOTE — Therapy (Signed)
 OUTPATIENT PHYSICAL THERAPY LOWER EXTREMITY TREATMENT   Patient Name: Todd Larson MRN: [id number] DOB:June 06, 1940, 83 y.o., male Today's Date: 10/24/2023  END OF SESSION:  PT End of Session - 10/24/23 1010     Visit Number 8    Date for Recertification  11/21/23    Authorization Type Medicare    PT Start Time 1008    PT Stop Time 1050    PT Time Calculation (min) 42 min    Activity Tolerance Patient tolerated treatment well    Behavior During Therapy WFL for tasks assessed/performed          Past Medical History:  Diagnosis Date   Arthritis    cervical spine - T7-8 & lumbar area - bone spurs up & down the back    Bulging discs     thoracic spine   Diverticulosis of colon    GERD (gastroesophageal reflux disease)    related to intestinal system, - has started Align  4-5 days ago-    Hyperlipidemia    Hypertension    Inguinal hernia    OSA (obstructive sleep apnea)    saw Dr. Neysa- 02/2015- pt. remarks that as a result BP med. was changed , pt. has a sleep number bed & he reports the score averages in the 80% range    PSA elevation    History of    Recurrent sinus infections    related to fracture nose x2, also reports that he has a deviated septum   Past Surgical History:  Procedure Laterality Date   COLONOSCOPY  2008   3 internal hemorrhoids, diverticulosis;Dr.Jacobs   INSERTION OF MESH N/A 11/13/2016   Procedure: INSERTION OF MESH;  Surgeon: Rubin Calamity, MD;  Location: [facility] OR;  Service: General;  Laterality: N/A;   LAPAROSCOPIC INGUINAL HERNIA WITH UMBILICAL HERNIA Left 11/13/2016   Procedure: LAPAROSCOPIC LEFT INGUINAL HERNIA  AND UMBILICAL HERNIA REPAIRS WITH MESH;  Surgeon: Rubin Calamity, MD;  Location: [facility] OR;  Service: General;  Laterality: Left;   PROSTATE BIOPSY     x 2, once abnormal, once normal   TONSILLECTOMY     Patient Active Problem List   Diagnosis Date Noted   Right knee meniscal tear 06/01/2015   Obstructive sleep apnea 02/07/2015    Thoracic spine pain 03/29/2014   Tenosynovitis of thumb 03/29/2014   Cervical radiculopathy due to degenerative joint disease of spine 10/10/2013   Bursitis of left shoulder 09/23/2013   Chronic low back pain 12/15/2012   Chronic cervical pain 06/09/2012   Hyperlipidemia 07/06/2010   Essential hypertension 11/03/2009   Inguinal hernia 11/03/2009   Nonallergic rhinitis 08/24/2009   Ventral hernia 05/20/2009   Diverticulosis of colon 05/20/2009   Benign neoplasm of skin 10/26/2008   GERD 10/01/2006    PCP: Merna Huxley NP  REFERRING PROVIDER: Joane Birmingham, MD  REFERRING DIAG: lumbar radiculitis  THERAPY DIAG:  Difficulty in walking, not elsewhere classified  Pain in left lower leg  Pain in right lower leg  Rationale for Evaluation and Treatment: Rehabilitation  ONSET DATE: 6 months march 2025  SUBJECTIVE:   SUBJECTIVE STATEMENT:   Had epidural injection Tuesday.  Slept good no pain until getting up in Wednesday morning.  Reports having  a lot of upper and mid back pain today, reports he feels like it was from the position of him being in his stomach with a towel roll under his chest.   Pain in the legs and the low back a 2-3/10   walked  24 min yesterday ( 35 min mile) walking longer but not same pace ( previously 24 min mile pace ) Awaiting to get back injection per MD pinched nerve. Thigh pain left > RT . Nights worse. Only stretching ex at home that works on seated HS stretch.    Bed just doesn't work for exercises, too soft. Seated HS stretch helps. MRI 10/9  EVAL:  For 8 months I walked 30 min, every day, now something has changed and I just can't do it , it feels like my legs are logs or full of water  PERTINENT HISTORY: Reports wakes up every morning with pain B lower legs, ant and lateral lower legs, also same pain pattern with walking .  Referred by sports med MD  PAIN:  Are you having pain?  3/10 L distal leg  PRECAUTIONS: None  RED  FLAGS: None   WEIGHT BEARING RESTRICTIONS: No  FALLS:  Has patient fallen in last 6 months? No  LIVING ENVIRONMENT: Lives with: lives alone Lives in: House/apartment Stairs: No Has following equipment at home: Single point cane  OCCUPATION: retired, does foster cats  PLOF: Independent  PATIENT GOALS: be able to walk 30 min to get my activity level also wake up without pain  NEXT MD VISIT: 09/26/23  OBJECTIVE:  Note: Objective measures were completed at Evaluation unless otherwise noted.  DIAGNOSTIC FINDINGS: x rays with arthritic changes L5/S1  PATIENT SURVEYS:  PSFS: THE PATIENT SPECIFIC FUNCTIONAL SCALE  Place score of 0-10 (0 = unable to perform activity and 10 = able to perform activity at the same level as before injury or problem)  Activity Date: 09/25/23    Walk greater than 20 min 2    2.stand from sleeping/lying down position  0    3.ascend/ descend 4 steps  0         Total Score 2      Total Score = Sum of activity scores/number of activities  Minimally Detectable Change: 3 points (for single activity); 2 points (for average score)  [name] (nd). The Patient Specific Functional Scale . Retrieved from [email or web address]   COGNITION: Overall cognitive status: Within functional limits for tasks assessed     SENSATION: WFL  EDEMA:  None noted  POSTURE: loss of lumbar lordosis, Er B hips, post pelvic tilt in standing and with gait  PALPATION: Non tender over post hips, tender with PA glide L2/3 Non tender B lower legs, ant, post musculature  LOWER EXTREMITY ROM:wfl B LUMBAR ROM: flex wnl, ext 50%, sb R with pain central lumbar, L sb wnl  LOWER EXTREMITY MMT:  MMT Right eval Left eval  Hip flexion wfl wfl  Hip extension    Hip abduction wfl wfl  Hip adduction    Hip internal rotation    Hip external rotation    Knee flexion    Knee extension    Ankle dorsiflexion     Ankle plantarflexion toe walking 4 3-  Ankle inversion    Ankle eversion     (Blank rows = not tested)  LOWER EXTREMITY SPECIAL TESTS:  na  FUNCTIONAL TESTS:  30 sec sit to sit 13 reps    GAIT: Distance walked: 69' in clinic, no deficits noted, no device  TREATMENT DATE:  10/24/23 Feet on ball K2C, small rotations, posterior isometrics, isometric abs PPT PAssive stretches calves, HS, piriformis, adductors, quads  10/22/23 NuStep L 5 x 6 min  Shoulder ER green 2x10  Cervical retractions  Shoulder horiz abd green 2x10  Seated OHP blue ball 2x10 Sit to stand holding blue ball 2x12 Bridges x15  W/ March    Stopped after a few reps due to L HS cramping  10/17/23 Nustep L 5 Gait around the back building around widest part of parking lor Seated Rows & Lats 35lb 2x12 Shoulder Ext 10lb 2x10 Lumbar ROM  WFL  S2S OHP yellow ball 2x10 Bridges x10 Feet on ball bridge, KTC,SLR,obl and iso abdominals PROM LE and trunk w/ end range holds   10/15/23 Nustep L 5 Standing HS stretch 20 sec 2 x BIL Standing step calf stretch 20 sec 3 x Black bar heel raises and toe raises 20 x 25# lat pull down and seated row 2 set 10 Black tband trunk flex and ext 20 x Wt ball trunk ext 10 x, trunk rotation 10 x each way Standing red tband hip ext and abd 2 sets 10- c/o increased increased pain with SLS Feet on ball bridge, KTC,SLR,obl and iso abdominals PROM LE and trunk   10/08/23 Nustep L 5 Slant board 3 x 20 s Black bar heel raises and toe raises 20 x each Leg press 30# 2 sets 10 Isometric abdominals 10 x hold 5 sec STS with wt ball 3 sets 5, yellow ball chest press 20# lats and rows 2 sets 10 Black tband trunk flex and ext 20 x Passive stretching LE and trunk   10/03/23 Nustep level 5 x 5 minutes Reviewed HEP gave cues Gait outside  around the back building and then to our front door, took 7 minutes and wsa tired in the legs, no extra pain  STS 2x 5 with weighted ball 20# lats 2x10 20# rows 2x10 Slant board stretch 3 x20 s LEg press 20# 2x10 Back to wall postural alignment and trying to hold 15-20 seconds Feet on ball K2C, rotation, bridge, isometric abs Passive LE stretch  09/27/23 Nustep level 5 x 6 minutes 20# Row 20# Lats Slant board stretch REviewed and performed HEP with him, needed a lot of cues due to his tightness Passive HS stretch a lot of cues needed to relax, very very tight Passive piriformis stretch Feet on ball K2C, rotation, posterior activation, isometric abs Added HEP and performed  09/26/23  Eval, education in anatomy of spine, positioning to decompress lumbar spine,  Education in sleeping position to reduce excessive side bending lumbar Inst in 2 ex indicated below to improve lumbar spine mobility and L ankle plantarflexion strength  PATIENT EDUCATION:  Education details: POC, goals Person educated: Patient Education method: Explanation, Demonstration, Tactile cues, Verbal cues, and Handouts Education comprehension: verbalized understanding  HOME EXERCISE PROGRAM: Access Code: [email or web address] URL: [email or web address] Date: 09/25/2023 Prepared by: Amy Speaks  Exercises - Supine Lower Trunk Rotation  - 1 x daily - 7 x weekly - 3 sets - 10 reps - Long Sitting Ankle Plantar Flexion with Resistance  - 1 x daily - 7 x weekly - 3 sets - 10 reps Access Code: [email or web address] URL: [email or web address] Date: 09/27/2023 Prepared by: Ozell Mainland  Exercises - Hooklying Single Knee to Chest Stretch  - 2 x daily - 7 x weekly - 1 sets - 10 reps - 5 hold - Seated Table Hamstring Stretch  -  2 x daily - 7 x weekly - 1 sets - 5 reps - 30 hold - Seated Hamstring Stretch  - 2 x daily - 7 x weekly - 1 sets - 5 reps - 30 hold - Seated Hamstring Stretch with Chair  - 2 x daily - 7  x weekly - 1 sets - 5 reps - 30 hold - Supine Piriformis Stretch Pulling Heel to Hip  - 2 x daily - 7 x weekly - 1 sets - 5 reps - 30 hold ASSESSMENT:  CLINICAL IMPRESSION:  Patient had epidural injection, he reports due to the positioning on his stomach his upper back was really hurting, due to the recent injection we did very light exercise and focused on stretching the LE's  He reports decrease cervical ROM, this is due to his forward flexed posture, forward head and rounded shoulders. Postural cuing needed throughout ex. Bilateral HS tightness with stretching. Some L arm pain reported holding blue ball with sit to stands. Will continues to work on posture.   OBJECTIVE IMPAIRMENTS: decreased balance, decreased endurance, decreased mobility, difficulty walking, decreased strength, hypomobility, and pain.   ACTIVITY LIMITATIONS: sleeping, stairs, transfers, bed mobility, and locomotion level  PARTICIPATION LIMITATIONS: laundry, shopping, and community activity  PERSONAL FACTORS: Age, Behavior pattern, Fitness, Time since onset of injury/illness/exacerbation, and 1 comorbidity: cervical radiculopathy are also affecting patient's functional outcome.   REHAB POTENTIAL: Good  CLINICAL DECISION MAKING: Evolving/moderate complexity  EVALUATION COMPLEXITY: Moderate   GOALS: Goals reviewed with patient? Yes  SHORT TERM GOALS: Target date: 2 weeks ,10/09/23 I HEP Baseline: Goal status:progressing 10/03/23   LONG TERM GOALS: Target date: 8 weeks 11/21/23  Able to walk 30 min 3 to 5 x week without interference from B lower leg pain Baseline:  Goal status: Progressing 15-20 min a day 10/24/23  2.  Improve B plantarflexor strength L from 3+/5  and R form 4/5 to 5/5 for improved balance Baseline:  Goal status: INITIAL  3.  Improve lumbar side bending to R to wnl without provocation of Sx Baseline:  Goal status: 10/17/23 Met    PLAN:  PT FREQUENCY: 2x/week  PT DURATION: 8  weeks  PLANNED INTERVENTIONS: 97110-Therapeutic exercises, 97530- Therapeutic activity, [id number]- Neuromuscular re-education, 97535- Self Care, 02859- Manual therapy, G0283- Electrical stimulation (unattended), 97035- Ultrasound, 02987- Traction (mechanical), and Patient/Family education  PLAN FOR NEXT SESSION: activities to improve B LE  strength, stretching Lumbar region and LE   Glendora Clouatre W, PT, 10/24/2023, 10:10 AM

## 2023-10-29 ENCOUNTER — Ambulatory Visit: Admitting: Physical Therapy

## 2023-10-29 DIAGNOSIS — M79661 Pain in right lower leg: Secondary | ICD-10-CM | POA: Diagnosis not present

## 2023-10-29 DIAGNOSIS — N138 Other obstructive and reflux uropathy: Secondary | ICD-10-CM | POA: Diagnosis not present

## 2023-10-29 DIAGNOSIS — M79662 Pain in left lower leg: Secondary | ICD-10-CM | POA: Diagnosis not present

## 2023-10-29 DIAGNOSIS — N401 Enlarged prostate with lower urinary tract symptoms: Secondary | ICD-10-CM | POA: Diagnosis not present

## 2023-10-29 DIAGNOSIS — R262 Difficulty in walking, not elsewhere classified: Secondary | ICD-10-CM | POA: Diagnosis not present

## 2023-10-29 NOTE — Therapy (Signed)
 OUTPATIENT PHYSICAL THERAPY LOWER EXTREMITY TREATMENT   Patient Name: Todd Larson MRN: [id number] DOB:04/07/1940, 83 y.o., male Today's Date: 10/29/2023  END OF SESSION:  PT End of Session - 10/29/23 1005     Visit Number 9    Date for Recertification  11/21/23    Authorization Type Medicare    PT Start Time 1005    PT Stop Time 1050    PT Time Calculation (min) 45 min          Past Medical History:  Diagnosis Date   Arthritis    cervical spine - T7-8 & lumbar area - bone spurs up & down the back    Bulging discs     thoracic spine   Diverticulosis of colon    GERD (gastroesophageal reflux disease)    related to intestinal system, - has started Align  4-5 days ago-    Hyperlipidemia    Hypertension    Inguinal hernia    OSA (obstructive sleep apnea)    saw Dr. Neysa- 02/2015- pt. remarks that as a result BP med. was changed , pt. has a sleep number bed & he reports the score averages in the 80% range    PSA elevation    History of    Recurrent sinus infections    related to fracture nose x2, also reports that he has a deviated septum   Past Surgical History:  Procedure Laterality Date   COLONOSCOPY  2008   3 internal hemorrhoids, diverticulosis;Dr.Jacobs   INSERTION OF MESH N/A 11/13/2016   Procedure: INSERTION OF MESH;  Surgeon: Rubin Calamity, MD;  Location: [facility] OR;  Service: General;  Laterality: N/A;   LAPAROSCOPIC INGUINAL HERNIA WITH UMBILICAL HERNIA Left 11/13/2016   Procedure: LAPAROSCOPIC LEFT INGUINAL HERNIA  AND UMBILICAL HERNIA REPAIRS WITH MESH;  Surgeon: Rubin Calamity, MD;  Location: [facility] OR;  Service: General;  Laterality: Left;   PROSTATE BIOPSY     x 2, once abnormal, once normal   TONSILLECTOMY     Patient Active Problem List   Diagnosis Date Noted   Right knee meniscal tear 06/01/2015   Obstructive sleep apnea 02/07/2015   Thoracic spine pain 03/29/2014   Tenosynovitis of thumb 03/29/2014   Cervical radiculopathy due to degenerative joint  disease of spine 10/10/2013   Bursitis of left shoulder 09/23/2013   Chronic low back pain 12/15/2012   Chronic cervical pain 06/09/2012   Hyperlipidemia 07/06/2010   Essential hypertension 11/03/2009   Inguinal hernia 11/03/2009   Nonallergic rhinitis 08/24/2009   Ventral hernia 05/20/2009   Diverticulosis of colon 05/20/2009   Benign neoplasm of skin 10/26/2008   GERD 10/01/2006    PCP: Merna Huxley NP  REFERRING PROVIDER: Joane Birmingham, MD  REFERRING DIAG: lumbar radiculitis  THERAPY DIAG:  Difficulty in walking, not elsewhere classified  Pain in left lower leg  Pain in right lower leg  Rationale for Evaluation and Treatment: Rehabilitation  ONSET DATE: 6 months march 2025  SUBJECTIVE:   SUBJECTIVE STATEMENT:    Has not been a great week.positioning from injection hurt entire spine. Pain comes  and goes and radiating pain changes .  Not intense and not all day long pain.   EVAL:  For 8 months I walked 30 min, every day, now something has changed and I just can't do it , it feels like my legs are logs or full of water  PERTINENT HISTORY: Reports wakes up every morning with pain B lower legs, ant and lateral lower legs,  also same pain pattern with walking .  Referred by sports med MD  PAIN:  Are you having pain?  3/10 L distal leg  PRECAUTIONS: None  RED FLAGS: None   WEIGHT BEARING RESTRICTIONS: No  FALLS:  Has patient fallen in last 6 months? No  LIVING ENVIRONMENT: Lives with: lives alone Lives in: House/apartment Stairs: No Has following equipment at home: Single point cane  OCCUPATION: retired, does foster cats  PLOF: Independent  PATIENT GOALS: be able to walk 30 min to get my activity level also wake up without pain  NEXT MD VISIT: 09/26/23  OBJECTIVE:  Note: Objective measures were completed at Evaluation unless otherwise noted.  DIAGNOSTIC FINDINGS: x rays with arthritic changes L5/S1  PATIENT SURVEYS:  PSFS: THE PATIENT SPECIFIC  FUNCTIONAL SCALE  Place score of 0-10 (0 = unable to perform activity and 10 = able to perform activity at the same level as before injury or problem)  Activity Date: 09/25/23    Walk greater than 20 min 2    2.stand from sleeping/lying down position  0    3.ascend/ descend 4 steps  0         Total Score 2      Total Score = Sum of activity scores/number of activities  Minimally Detectable Change: 3 points (for single activity); 2 points (for average score)  [name] (nd). The Patient Specific Functional Scale . Retrieved from [email or web address]   COGNITION: Overall cognitive status: Within functional limits for tasks assessed     SENSATION: WFL  EDEMA:  None noted  POSTURE: loss of lumbar lordosis, Er B hips, post pelvic tilt in standing and with gait  PALPATION: Non tender over post hips, tender with PA glide L2/3 Non tender B lower legs, ant, post musculature  LOWER EXTREMITY ROM:wfl B LUMBAR ROM: flex wnl, ext 50%, sb R with pain central lumbar, L sb wnl  LOWER EXTREMITY MMT:  MMT Right eval Left eval  Hip flexion wfl wfl  Hip extension    Hip abduction wfl wfl  Hip adduction    Hip internal rotation    Hip external rotation    Knee flexion    Knee extension    Ankle dorsiflexion    Ankle plantarflexion toe walking 4 3-  Ankle inversion    Ankle eversion     (Blank rows = not tested)  LOWER EXTREMITY SPECIAL TESTS:  na  FUNCTIONAL TESTS:  30 sec sit to sit 13 reps    GAIT: Distance walked: 35' in clinic, no deficits noted, no device                                                                                                                               TREATMENT DATE:   10/29/23 Nustep L 6 -  can feel this more today in LB Seated row and lat pull down 25# 2 sets 10 Black tband trunk ext 2 sets  10 Shoulder ER green 2x10 - at wall Cervical retractions - at  wall Shoulder horiz abd green 2x10 - at wall Ball vs wall push ups 5 x CC and CCW Seated isometric abdominals 2 sets 10 Supine feet on ball bridge,KTC and obl LE PROM and trunk    10/24/23 Feet on ball K2C, small rotations, posterior isometrics, isometric abs PPT PAssive stretches calves, HS, piriformis, adductors, quads  10/22/23 NuStep L 5 x 6 min  Shoulder ER green 2x10  Cervical retractions  Shoulder horiz abd green 2x10  Seated OHP blue ball 2x10 Sit to stand holding blue ball 2x12 Bridges x15  W/ March    Stopped after a few reps due to L HS cramping  10/17/23 Nustep L 5 Gait around the back building around widest part of parking lor Seated Rows & Lats 35lb 2x12 Shoulder Ext 10lb 2x10 Lumbar ROM  WFL  S2S OHP yellow ball 2x10 Bridges x10 Feet on ball bridge, KTC,SLR,obl and iso abdominals PROM LE and trunk w/ end range holds   10/15/23 Nustep L 5 Standing HS stretch 20 sec 2 x BIL Standing step calf stretch 20 sec 3 x Black bar heel raises and toe raises 20 x 25# lat pull down and seated row 2 set 10 Black tband trunk flex and ext 20 x Wt ball trunk ext 10 x, trunk rotation 10 x each way Standing red tband hip ext and abd 2 sets 10- c/o increased increased pain with SLS Feet on ball bridge, KTC,SLR,obl and iso abdominals PROM LE and trunk   10/08/23 Nustep L 5 Slant board 3 x 20 s Black bar heel raises and toe raises 20 x each Leg press 30# 2 sets 10 Isometric abdominals 10 x hold 5 sec STS with wt ball 3 sets 5, yellow ball chest press 20# lats and rows 2 sets 10 Black tband trunk flex and ext 20 x Passive stretching LE and trunk   10/03/23 Nustep level 5 x 5 minutes Reviewed HEP gave cues Gait outside around the back building and then to our front door, took 7 minutes and wsa tired in the legs, no extra pain  STS 2x 5 with weighted ball 20# lats 2x10 20# rows 2x10 Slant board stretch 3 x20 s LEg press 20# 2x10 Back to wall  postural alignment and trying to hold 15-20 seconds Feet on ball K2C, rotation, bridge, isometric abs Passive LE stretch  09/27/23 Nustep level 5 x 6 minutes 20# Row 20# Lats Slant board stretch REviewed and performed HEP with him, needed a lot of cues due to his tightness Passive HS stretch a lot of cues needed to relax, very very tight Passive piriformis stretch Feet on ball K2C, rotation, posterior activation, isometric abs Added HEP and performed  09/26/23  Eval, education in anatomy of spine, positioning to decompress lumbar spine,  Education in sleeping position to reduce excessive side bending lumbar Inst in 2 ex indicated below to improve lumbar spine mobility and L ankle plantarflexion strength  PATIENT EDUCATION:  Education details: POC, goals Person educated: Patient Education method: Explanation, Demonstration, Tactile cues, Verbal cues, and Handouts Education comprehension: verbalized understanding  HOME EXERCISE PROGRAM: Access Code: [email or web address] URL: [email or web address] Date: 09/25/2023 Prepared by: Amy Speaks  Exercises - Supine Lower Trunk Rotation  - 1 x daily - 7 x weekly - 3 sets - 10 reps - Long Sitting Ankle Plantar Flexion with Resistance  - 1 x daily - 7 x weekly - 3  sets - 10 reps Access Code: [email or web address] URL: [email or web address] Date: 09/27/2023 Prepared by: Ozell Mainland  Exercises - Hooklying Single Knee to Chest Stretch  - 2 x daily - 7 x weekly - 1 sets - 10 reps - 5 hold - Seated Table Hamstring Stretch  - 2 x daily - 7 x weekly - 1 sets - 5 reps - 30 hold - Seated Hamstring Stretch  - 2 x daily - 7 x weekly - 1 sets - 5 reps - 30 hold - Seated Hamstring Stretch with Chair  - 2 x daily - 7 x weekly - 1 sets - 5 reps - 30 hold - Supine Piriformis Stretch Pulling Heel to Hip  - 2 x daily - 7 x weekly - 1 sets - 5 reps - 30 hold ASSESSMENT:  CLINICAL IMPRESSION  pt arrives stating is has not been a great  week.positioning from injection hurt entire spine. Pain comes  and goes and radiating pain changes .  Not intense and not all day long pain.Continue to progress core and posture strength with cuing and stretching as pnt is very tight.    OBJECTIVE IMPAIRMENTS: decreased balance, decreased endurance, decreased mobility, difficulty walking, decreased strength, hypomobility, and pain.   ACTIVITY LIMITATIONS: sleeping, stairs, transfers, bed mobility, and locomotion level  PARTICIPATION LIMITATIONS: laundry, shopping, and community activity  PERSONAL FACTORS: Age, Behavior pattern, Fitness, Time since onset of injury/illness/exacerbation, and 1 comorbidity: cervical radiculopathy are also affecting patient's functional outcome.   REHAB POTENTIAL: Good  CLINICAL DECISION MAKING: Evolving/moderate complexity  EVALUATION COMPLEXITY: Moderate   GOALS: Goals reviewed with patient? Yes  SHORT TERM GOALS: Target date: 2 weeks ,10/09/23 I HEP Baseline: Goal status:progressing 10/03/23   LONG TERM GOALS: Target date: 8 weeks 11/21/23  Able to walk 30 min 3 to 5 x week without interference from B lower leg pain Baseline:  Goal status: Progressing 15-20 min a day 10/24/23  2.  Improve B plantarflexor strength L from 3+/5  and R form 4/5 to 5/5 for improved balance Baseline:  Goal status: INITIAL  3.  Improve lumbar side bending to R to wnl without provocation of Sx Baseline:  Goal status: 10/17/23 Met    PLAN:  PT FREQUENCY: 2x/week  PT DURATION: 8 weeks  PLANNED INTERVENTIONS: 97110-Therapeutic exercises, 97530- Therapeutic activity, [id number]- Neuromuscular re-education, 97535- Self Care, [id number]- Manual therapy, [id number]- Electrical stimulation (unattended), 97035- Ultrasound, 02987- Traction (mechanical), and Patient/Family education  PLAN FOR NEXT SESSION: goals and 10th visit next session  Karlei Waldo,ANGIE, PTA, 10/29/2023, 10:05 AM

## 2023-10-31 ENCOUNTER — Encounter: Payer: Self-pay | Admitting: Physical Therapy

## 2023-10-31 ENCOUNTER — Ambulatory Visit: Admitting: Physical Therapy

## 2023-10-31 DIAGNOSIS — R262 Difficulty in walking, not elsewhere classified: Secondary | ICD-10-CM

## 2023-10-31 DIAGNOSIS — M79662 Pain in left lower leg: Secondary | ICD-10-CM

## 2023-10-31 DIAGNOSIS — M79661 Pain in right lower leg: Secondary | ICD-10-CM | POA: Diagnosis not present

## 2023-10-31 NOTE — Therapy (Signed)
 OUTPATIENT PHYSICAL THERAPY LOWER EXTREMITY TREATMENT Progress Note Reporting Period 09/25/23 to 10/31/23  See note below for Objective Data and Assessment of Progress/Goals.      Patient Name: Todd Larson MRN: [id number] DOB:Jan 27, 1940, 83 y.o., male Today's Date: 10/31/2023  END OF SESSION:  PT End of Session - 10/31/23 1400     Visit Number 10    Date for Recertification  11/21/23    Authorization Type Medicare    PT Start Time 1400    PT Stop Time 1445    PT Time Calculation (min) 45 min    Activity Tolerance Patient tolerated treatment well    Behavior During Therapy WFL for tasks assessed/performed          Past Medical History:  Diagnosis Date   Arthritis    cervical spine - T7-8 & lumbar area - bone spurs up & down the back    Bulging discs     thoracic spine   Diverticulosis of colon    GERD (gastroesophageal reflux disease)    related to intestinal system, - has started Align  4-5 days ago-    Hyperlipidemia    Hypertension    Inguinal hernia    OSA (obstructive sleep apnea)    saw Dr. Neysa- 02/2015- pt. remarks that as a result BP med. was changed , pt. has a sleep number bed & he reports the score averages in the 80% range    PSA elevation    History of    Recurrent sinus infections    related to fracture nose x2, also reports that he has a deviated septum   Past Surgical History:  Procedure Laterality Date   COLONOSCOPY  2008   3 internal hemorrhoids, diverticulosis;Dr.Jacobs   INSERTION OF MESH N/A 11/13/2016   Procedure: INSERTION OF MESH;  Surgeon: Rubin Calamity, MD;  Location: [facility] OR;  Service: General;  Laterality: N/A;   LAPAROSCOPIC INGUINAL HERNIA WITH UMBILICAL HERNIA Left 11/13/2016   Procedure: LAPAROSCOPIC LEFT INGUINAL HERNIA  AND UMBILICAL HERNIA REPAIRS WITH MESH;  Surgeon: Rubin Calamity, MD;  Location: [facility] OR;  Service: General;  Laterality: Left;   PROSTATE BIOPSY     x 2, once abnormal, once normal   TONSILLECTOMY      Patient Active Problem List   Diagnosis Date Noted   Right knee meniscal tear 06/01/2015   Obstructive sleep apnea 02/07/2015   Thoracic spine pain 03/29/2014   Tenosynovitis of thumb 03/29/2014   Cervical radiculopathy due to degenerative joint disease of spine 10/10/2013   Bursitis of left shoulder 09/23/2013   Chronic low back pain 12/15/2012   Chronic cervical pain 06/09/2012   Hyperlipidemia 07/06/2010   Essential hypertension 11/03/2009   Inguinal hernia 11/03/2009   Nonallergic rhinitis 08/24/2009   Ventral hernia 05/20/2009   Diverticulosis of colon 05/20/2009   Benign neoplasm of skin 10/26/2008   GERD 10/01/2006    PCP: Merna Huxley NP  REFERRING PROVIDER: Joane Birmingham, MD  REFERRING DIAG: lumbar radiculitis  THERAPY DIAG:  Difficulty in walking, not elsewhere classified  Pain in left lower leg  Pain in right lower leg  Rationale for Evaluation and Treatment: Rehabilitation  ONSET DATE: 6 months march 2025  SUBJECTIVE:   SUBJECTIVE STATEMENT:    Reports no real help from the injection, c/o some tightness in the HS   EVAL:  For 8 months I walked 30 min, every day, now something has changed and I just can't do it , it feels like my legs are  logs or full of water  PERTINENT HISTORY: Reports wakes up every morning with pain B lower legs, ant and lateral lower legs, also same pain pattern with walking .  Referred by sports med MD  PAIN:  Are you having pain?  3/10 L distal leg  PRECAUTIONS: None  RED FLAGS: None   WEIGHT BEARING RESTRICTIONS: No  FALLS:  Has patient fallen in last 6 months? No  LIVING ENVIRONMENT: Lives with: lives alone Lives in: House/apartment Stairs: No Has following equipment at home: Single point cane  OCCUPATION: retired, does foster cats  PLOF: Independent  PATIENT GOALS: be able to walk 30 min to get my activity level also wake up without pain  NEXT MD VISIT: 09/26/23  OBJECTIVE:  Note: Objective measures  were completed at Evaluation unless otherwise noted.  DIAGNOSTIC FINDINGS: x rays with arthritic changes L5/S1  PATIENT SURVEYS:  PSFS: THE PATIENT SPECIFIC FUNCTIONAL SCALE  Place score of 0-10 (0 = unable to perform activity and 10 = able to perform activity at the same level as before injury or problem)  Activity Date: 09/25/23    Walk greater than 20 min 2    2.stand from sleeping/lying down position  0    3.ascend/ descend 4 steps  0         Total Score 2      Total Score = Sum of activity scores/number of activities  Minimally Detectable Change: 3 points (for single activity); 2 points (for average score)  [name] (nd). The Patient Specific Functional Scale . Retrieved from [email or web address]   COGNITION: Overall cognitive status: Within functional limits for tasks assessed     SENSATION: WFL  EDEMA:  None noted  POSTURE: loss of lumbar lordosis, Er B hips, post pelvic tilt in standing and with gait  PALPATION: Non tender over post hips, tender with PA glide L2/3 Non tender B lower legs, ant, post musculature  LOWER EXTREMITY ROM:wfl B LUMBAR ROM: flex wnl, ext 50%, sb R with pain central lumbar, L sb wnl  LOWER EXTREMITY MMT:  MMT Right eval Left eval  Hip flexion wfl wfl  Hip extension    Hip abduction wfl wfl  Hip adduction    Hip internal rotation    Hip external rotation    Knee flexion    Knee extension    Ankle dorsiflexion    Ankle plantarflexion toe walking 4 3-  Ankle inversion    Ankle eversion     (Blank rows = not tested)  LOWER EXTREMITY SPECIAL TESTS:  na  FUNCTIONAL TESTS:  30 sec sit to sit 13 reps    GAIT: Distance walked: 52' in clinic, no deficits noted, no device                                                                                                                               TREATMENT DATE:  10/31/23 Gait around the building, fast pace  no  rest around the building Nutep level 5 x 6 minutes Back to wall red tband horizontal abduction 20# lats Slant board stretch 5# straight arm pulls STM to the upper traps Supine Feet on ball K2C, rotation, bridge, isometric abs Passive LE stretch, trunk strethces  10/29/23 Nustep L 6 -  can feel this more today in LB Seated row and lat pull down 25# 2 sets 10 Black tband trunk ext 2 sets 10 Shoulder ER green 2x10 - at wall Cervical retractions - at wall Shoulder horiz abd green 2x10 - at wall Ball vs wall push ups 5 x CC and CCW Seated isometric abdominals 2 sets 10 Supine feet on ball bridge,KTC and obl LE PROM and trunk    10/24/23 Feet on ball K2C, small rotations, posterior isometrics, isometric abs PPT PAssive stretches calves, HS, piriformis, adductors, quads  10/22/23 NuStep L 5 x 6 min  Shoulder ER green 2x10  Cervical retractions  Shoulder horiz abd green 2x10  Seated OHP blue ball 2x10 Sit to stand holding blue ball 2x12 Bridges x15  W/ March    Stopped after a few reps due to L HS cramping  10/17/23 Nustep L 5 Gait around the back building around widest part of parking lor Seated Rows & Lats 35lb 2x12 Shoulder Ext 10lb 2x10 Lumbar ROM  WFL  S2S OHP yellow ball 2x10 Bridges x10 Feet on ball bridge, KTC,SLR,obl and iso abdominals PROM LE and trunk w/ end range holds   10/15/23 Nustep L 5 Standing HS stretch 20 sec 2 x BIL Standing step calf stretch 20 sec 3 x Black bar heel raises and toe raises 20 x 25# lat pull down and seated row 2 set 10 Black tband trunk flex and ext 20 x Wt ball trunk ext 10 x, trunk rotation 10 x each way Standing red tband hip ext and abd 2 sets 10- c/o increased increased pain with SLS Feet on ball bridge, KTC,SLR,obl and iso abdominals PROM LE and trunk   10/08/23 Nustep L 5 Slant board 3 x 20 s Black bar heel raises and toe raises 20 x each Leg press 30# 2 sets 10 Isometric abdominals 10 x  hold 5 sec STS with wt ball 3 sets 5, yellow ball chest press 20# lats and rows 2 sets 10 Black tband trunk flex and ext 20 x Passive stretching LE and trunk   10/03/23 Nustep level 5 x 5 minutes Reviewed HEP gave cues Gait outside around the back building and then to our front door, took 7 minutes and wsa tired in the legs, no extra pain  STS 2x 5 with weighted ball 20# lats 2x10 20# rows 2x10 Slant board stretch 3 x20 s LEg press 20# 2x10 Back to wall postural alignment and trying to hold 15-20 seconds Feet on ball K2C, rotation, bridge, isometric abs Passive LE stretch  09/27/23 Nustep level 5 x 6 minutes 20# Row 20# Lats Slant board stretch REviewed and performed HEP with him, needed a lot of cues due to his tightness Passive HS stretch a lot of cues needed to relax, very very tight Passive piriformis stretch Feet on ball K2C, rotation, posterior activation, isometric abs Added HEP and performed  09/26/23  Eval, education in anatomy of spine, positioning to decompress lumbar spine,  Education in sleeping position to reduce excessive side bending lumbar Inst in 2 ex indicated below to improve lumbar spine mobility and L ankle plantarflexion strength  PATIENT EDUCATION:  Education  details: POC, goals Person educated: Patient Education method: Explanation, Demonstration, Tactile cues, Verbal cues, and Handouts Education comprehension: verbalized understanding  HOME EXERCISE PROGRAM: Access Code: [email or web address] URL: [email or web address] Date: 09/25/2023 Prepared by: Amy Speaks  Exercises - Supine Lower Trunk Rotation  - 1 x daily - 7 x weekly - 3 sets - 10 reps - Long Sitting Ankle Plantar Flexion with Resistance  - 1 x daily - 7 x weekly - 3 sets - 10 reps Access Code: [email or web address] URL: [email or web address] Date: 09/27/2023 Prepared by: Ozell Mainland  Exercises - Hooklying Single Knee to Chest Stretch  - 2 x daily - 7 x weekly - 1 sets -  10 reps - 5 hold - Seated Table Hamstring Stretch  - 2 x daily - 7 x weekly - 1 sets - 5 reps - 30 hold - Seated Hamstring Stretch  - 2 x daily - 7 x weekly - 1 sets - 5 reps - 30 hold - Seated Hamstring Stretch with Chair  - 2 x daily - 7 x weekly - 1 sets - 5 reps - 30 hold - Supine Piriformis Stretch Pulling Heel to Hip  - 2 x daily - 7 x weekly - 1 sets - 5 reps - 30 hold ASSESSMENT:  CLINICAL IMPRESSION   Patient reports no real changes with the injection, I did work a little more on posture today, HS are very tight, he has severe forward head posture, he did not get as tired or have as much pain with the walk today  OBJECTIVE IMPAIRMENTS: decreased balance, decreased endurance, decreased mobility, difficulty walking, decreased strength, hypomobility, and pain.   ACTIVITY LIMITATIONS: sleeping, stairs, transfers, bed mobility, and locomotion level  PARTICIPATION LIMITATIONS: laundry, shopping, and community activity  PERSONAL FACTORS: Age, Behavior pattern, Fitness, Time since onset of injury/illness/exacerbation, and 1 comorbidity: cervical radiculopathy are also affecting patient's functional outcome.   REHAB POTENTIAL: Good  CLINICAL DECISION MAKING: Evolving/moderate complexity  EVALUATION COMPLEXITY: Moderate   GOALS: Goals reviewed with patient? Yes  SHORT TERM GOALS: Target date: 2 weeks ,10/09/23 I HEP Baseline: Goal status:progressing 10/03/23   LONG TERM GOALS: Target date: 8 weeks 11/21/23  Able to walk 30 min 3 to 5 x week without interference from B lower leg pain Baseline:  Goal status: Progressing 15-20 min a day 10/31/23  2.  Improve B plantarflexor strength L from 3+/5  and R form 4/5 to 5/5 for improved balance Baseline:  Goal status: INITIAL  3.  Improve lumbar side bending to R to wnl without provocation of Sx Baseline:  Goal status: 10/17/23 Met    PLAN:  PT FREQUENCY: 2x/week  PT DURATION: 8 weeks  PLANNED INTERVENTIONS:  97110-Therapeutic exercises, 97530- Therapeutic activity, [id number]- Neuromuscular re-education, 97535- Self Care, 02859- Manual therapy, [id number]- Electrical stimulation (unattended), 97035- Ultrasound, 02987- Traction (mechanical), and Patient/Family education  PLAN FOR NEXT SESSION: Continue with posture, flexibility and strength  Mansa Willers W, PT, 10/31/2023, 2:09 PM

## 2023-11-05 ENCOUNTER — Ambulatory Visit: Attending: Family Medicine | Admitting: Physical Therapy

## 2023-11-05 ENCOUNTER — Encounter: Payer: Self-pay | Admitting: Physical Therapy

## 2023-11-05 DIAGNOSIS — R262 Difficulty in walking, not elsewhere classified: Secondary | ICD-10-CM | POA: Diagnosis present

## 2023-11-05 DIAGNOSIS — M79661 Pain in right lower leg: Secondary | ICD-10-CM | POA: Diagnosis present

## 2023-11-05 DIAGNOSIS — M79662 Pain in left lower leg: Secondary | ICD-10-CM | POA: Insufficient documentation

## 2023-11-05 NOTE — Therapy (Signed)
 OUTPATIENT PHYSICAL THERAPY LOWER EXTREMITY TREATMENT    Patient Name: Todd Larson MRN: [id number] DOB:[birth year], 83 y.o., male Today's Date: 11/05/2023  END OF SESSION:  PT End of Session - 11/05/23 1028     Visit Number 11    Date for Recertification  11/21/23    Authorization Type Medicare    PT Start Time 1001    PT Stop Time 1050    PT Time Calculation (min) 49 min    Activity Tolerance Patient tolerated treatment well    Behavior During Therapy WFL for tasks assessed/performed          Past Medical History:  Diagnosis Date   Arthritis    cervical spine - T7-8 & lumbar area - bone spurs up & down the back    Bulging discs     thoracic spine   Diverticulosis of colon    GERD (gastroesophageal reflux disease)    related to intestinal system, - has started Align  4-5 days ago-    Hyperlipidemia    Hypertension    Inguinal hernia    OSA (obstructive sleep apnea)    saw Dr. Neysa- 02/2015- pt. remarks that as a result BP med. was changed , pt. has a sleep number bed & he reports the score averages in the 80% range    PSA elevation    History of    Recurrent sinus infections    related to fracture nose x2, also reports that he has a deviated septum   Past Surgical History:  Procedure Laterality Date   COLONOSCOPY  2008   3 internal hemorrhoids, diverticulosis;Dr.Jacobs   INSERTION OF MESH N/A 11/13/2016   Procedure: INSERTION OF MESH;  Surgeon: Rubin Calamity, MD;  Location: [facility] OR;  Service: General;  Laterality: N/A;   LAPAROSCOPIC INGUINAL HERNIA WITH UMBILICAL HERNIA Left 11/13/2016   Procedure: LAPAROSCOPIC LEFT INGUINAL HERNIA  AND UMBILICAL HERNIA REPAIRS WITH MESH;  Surgeon: Rubin Calamity, MD;  Location: [facility] OR;  Service: General;  Laterality: Left;   PROSTATE BIOPSY     x 2, once abnormal, once normal   TONSILLECTOMY     Patient Active Problem List   Diagnosis Date Noted   Right knee meniscal tear 06/01/2015   Obstructive sleep apnea 02/07/2015    Thoracic spine pain 03/29/2014   Tenosynovitis of thumb 03/29/2014   Cervical radiculopathy due to degenerative joint disease of spine 10/10/2013   Bursitis of left shoulder 09/23/2013   Chronic low back pain 12/15/2012   Chronic cervical pain 06/09/2012   Hyperlipidemia 07/06/2010   Essential hypertension 11/03/2009   Inguinal hernia 11/03/2009   Nonallergic rhinitis 08/24/2009   Ventral hernia 05/20/2009   Diverticulosis of colon 05/20/2009   Benign neoplasm of skin 10/26/2008   GERD 10/01/2006    PCP: Merna Huxley NP  REFERRING PROVIDER: Joane Birmingham, MD  REFERRING DIAG: lumbar radiculitis  THERAPY DIAG:  Difficulty in walking, not elsewhere classified  Pain in left lower leg  Pain in right lower leg  Rationale for Evaluation and Treatment: Rehabilitation  ONSET DATE: 6 months march 2025  SUBJECTIVE:   SUBJECTIVE STATEMENT:    I had some ache in my HS last night, I did seem to twist my knee over the weekend and I have been careful since then   EVAL:  For 8 months I walked 30 min, every day, now something has changed and I just can't do it , it feels like my legs are logs or full of water  PERTINENT  HISTORY: Reports wakes up every morning with pain B lower legs, ant and lateral lower legs, also same pain pattern with walking .  Referred by sports med MD  PAIN:  Are you having pain?  3/10 L distal leg  PRECAUTIONS: None  RED FLAGS: None   WEIGHT BEARING RESTRICTIONS: No  FALLS:  Has patient fallen in last 6 months? No  LIVING ENVIRONMENT: Lives with: lives alone Lives in: House/apartment Stairs: No Has following equipment at home: Single point cane  OCCUPATION: retired, does foster cats  PLOF: Independent  PATIENT GOALS: be able to walk 30 min to get my activity level also wake up without pain  NEXT MD VISIT: 09/26/23  OBJECTIVE:  Note: Objective measures were completed at Evaluation unless otherwise noted.  DIAGNOSTIC FINDINGS: x rays with  arthritic changes L5/S1  PATIENT SURVEYS:  PSFS: THE PATIENT SPECIFIC FUNCTIONAL SCALE  Place score of 0-10 (0 = unable to perform activity and 10 = able to perform activity at the same level as before injury or problem)  Activity Date: 09/25/23    Walk greater than 20 min 2    2.stand from sleeping/lying down position  0    3.ascend/ descend 4 steps  0         Total Score 2      Total Score = Sum of activity scores/number of activities  Minimally Detectable Change: 3 points (for single activity); 2 points (for average score)  [name] (nd). The Patient Specific Functional Scale . Retrieved from [email or web address].[email or web address]   COGNITION: Overall cognitive status: Within functional limits for tasks assessed     SENSATION: WFL  EDEMA:  None noted  POSTURE: loss of lumbar lordosis, Er B hips, post pelvic tilt in standing and with gait  PALPATION: Non tender over post hips, tender with PA glide L2/3 Non tender B lower legs, ant, post musculature  LOWER EXTREMITY ROM:wfl B LUMBAR ROM: flex wnl, ext 50%, sb R with pain central lumbar, L sb wnl  LOWER EXTREMITY MMT:  MMT Right eval Left eval  Hip flexion wfl wfl  Hip extension    Hip abduction wfl wfl  Hip adduction    Hip internal rotation    Hip external rotation    Knee flexion    Knee extension    Ankle dorsiflexion    Ankle plantarflexion toe walking 4 3-  Ankle inversion    Ankle eversion     (Blank rows = not tested)  LOWER EXTREMITY SPECIAL TESTS:  na  FUNCTIONAL TESTS:  30 sec sit to sit 13 reps    GAIT: Distance walked: 55' in clinic, no deficits noted, no device                                                                                                                               TREATMENT DATE:  11/05/23 Nustep level 5 x 5 minutes Gait outside 2 laps no rest Seated  row 20# Lats 20# Passive LE stretches STM to the  HS  10/31/23 Gait around the building, fast pace no rest around the building Nutep level 5 x 6 minutes Back to wall red tband horizontal abduction 20# lats Slant board stretch 5# straight arm pulls STM to the upper traps Supine Feet on ball K2C, rotation, bridge, isometric abs Passive LE stretch, trunk strethces  10/29/23 Nustep L 6 -  can feel this more today in LB Seated row and lat pull down 25# 2 sets 10 Black tband trunk ext 2 sets 10 Shoulder ER green 2x10 - at wall Cervical retractions - at wall Shoulder horiz abd green 2x10 - at wall Ball vs wall push ups 5 x CC and CCW Seated isometric abdominals 2 sets 10 Supine feet on ball bridge,KTC and obl LE PROM and trunk    10/24/23 Feet on ball K2C, small rotations, posterior isometrics, isometric abs PPT PAssive stretches calves, HS, piriformis, adductors, quads  10/22/23 NuStep L 5 x 6 min  Shoulder ER green 2x10  Cervical retractions  Shoulder horiz abd green 2x10  Seated OHP blue ball 2x10 Sit to stand holding blue ball 2x12 Bridges x15  W/ March    Stopped after a few reps due to L HS cramping  10/17/23 Nustep L 5 Gait around the back building around widest part of parking lor Seated Rows & Lats 35lb 2x12 Shoulder Ext 10lb 2x10 Lumbar ROM  WFL  S2S OHP yellow ball 2x10 Bridges x10 Feet on ball bridge, KTC,SLR,obl and iso abdominals PROM LE and trunk w/ end range holds   10/15/23 Nustep L 5 Standing HS stretch 20 sec 2 x BIL Standing step calf stretch 20 sec 3 x Black bar heel raises and toe raises 20 x 25# lat pull down and seated row 2 set 10 Black tband trunk flex and ext 20 x Wt ball trunk ext 10 x, trunk rotation 10 x each way Standing red tband hip ext and abd 2 sets 10- c/o increased increased pain with SLS Feet on ball bridge, KTC,SLR,obl and iso abdominals PROM LE and trunk   10/08/23 Nustep L 5 Slant board 3 x 20 s Black bar heel raises and toe raises 20 x  each Leg press 30# 2 sets 10 Isometric abdominals 10 x hold 5 sec STS with wt ball 3 sets 5, yellow ball chest press 20# lats and rows 2 sets 10 Black tband trunk flex and ext 20 x Passive stretching LE and trunk   10/03/23 Nustep level 5 x 5 minutes Reviewed HEP gave cues Gait outside around the back building and then to our front door, took 7 minutes and wsa tired in the legs, no extra pain  STS 2x 5 with weighted ball 20# lats 2x10 20# rows 2x10 Slant board stretch 3 x20 s LEg press 20# 2x10 Back to wall postural alignment and trying to hold 15-20 seconds Feet on ball K2C, rotation, bridge, isometric abs Passive LE stretch  09/27/23 Nustep level 5 x 6 minutes 20# Row 20# Lats Slant board stretch REviewed and performed HEP with him, needed a lot of cues due to his tightness Passive HS stretch a lot of cues needed to relax, very very tight Passive piriformis stretch Feet on ball K2C, rotation, posterior activation, isometric abs Added HEP and performed  09/26/23  Eval, education in anatomy of spine, positioning to decompress lumbar spine,  Education in sleeping position to reduce excessive side bending lumbar Inst in  2 ex indicated below to improve lumbar spine mobility and L ankle plantarflexion strength  PATIENT EDUCATION:  Education details: POC, goals Person educated: Patient Education method: Explanation, Demonstration, Tactile cues, Verbal cues, and Handouts Education comprehension: verbalized understanding  HOME EXERCISE PROGRAM: Access Code: [email or web address] URL: [email or web address] Date: 09/25/2023 Prepared by: Amy Speaks  Exercises - Supine Lower Trunk Rotation  - 1 x daily - 7 x weekly - 3 sets - 10 reps - Long Sitting Ankle Plantar Flexion with Resistance  - 1 x daily - 7 x weekly - 3 sets - 10 reps Access Code: [email or web address] URL: [email or web address] Date: 09/27/2023 Prepared by: Ozell Mainland  Exercises - Hooklying Single  Knee to Chest Stretch  - 2 x daily - 7 x weekly - 1 sets - 10 reps - 5 hold - Seated Table Hamstring Stretch  - 2 x daily - 7 x weekly - 1 sets - 5 reps - 30 hold - Seated Hamstring Stretch  - 2 x daily - 7 x weekly - 1 sets - 5 reps - 30 hold - Seated Hamstring Stretch with Chair  - 2 x daily - 7 x weekly - 1 sets - 5 reps - 30 hold - Supine Piriformis Stretch Pulling Heel to Hip  - 2 x daily - 7 x weekly - 1 sets - 5 reps - 30 hold ASSESSMENT:  CLINICAL IMPRESSION   Patient reports no real changes with the injection, He was able to do two laps around the building a little limp on the left leg, he reports twisting the knee over the weekend, seemed to tolerate the stretches better today  OBJECTIVE IMPAIRMENTS: decreased balance, decreased endurance, decreased mobility, difficulty walking, decreased strength, hypomobility, and pain.   ACTIVITY LIMITATIONS: sleeping, stairs, transfers, bed mobility, and locomotion level  PARTICIPATION LIMITATIONS: laundry, shopping, and community activity  PERSONAL FACTORS: Age, Behavior pattern, Fitness, Time since onset of injury/illness/exacerbation, and 1 comorbidity: cervical radiculopathy are also affecting patient's functional outcome.   REHAB POTENTIAL: Good  CLINICAL DECISION MAKING: Evolving/moderate complexity  EVALUATION COMPLEXITY: Moderate   GOALS: Goals reviewed with patient? Yes  SHORT TERM GOALS: Target date: 2 weeks ,10/09/23 I HEP Baseline: Goal status:progressing 10/03/23   LONG TERM GOALS: Target date: 8 weeks 11/21/23  Able to walk 30 min 3 to 5 x week without interference from B lower leg pain Baseline:  Goal status: Progressing 15-20 min a day 10/31/23  2.  Improve B plantarflexor strength L from 3+/5  and R form 4/5 to 5/5 for improved balance Baseline:  Goal status:progressing 11/05/23  3.  Improve lumbar side bending to R to wnl without provocation of Sx Baseline:  Goal status: 10/17/23 Met    PLAN:  PT  FREQUENCY: 2x/week  PT DURATION: 8 weeks  PLANNED INTERVENTIONS: 97110-Therapeutic exercises, 97530- Therapeutic activity, [id number]- Neuromuscular re-education, 97535- Self Care, [id number]- Manual therapy, [id number]- Electrical stimulation (unattended), 97035- Ultrasound, 02987- Traction (mechanical), and Patient/Family education  PLAN FOR NEXT SESSION: Continue with posture, flexibility and strength  Yevette Knust W, PT, 11/05/2023, 10:28 AM

## 2023-11-07 ENCOUNTER — Ambulatory Visit: Admitting: Physical Therapy

## 2023-11-07 DIAGNOSIS — R262 Difficulty in walking, not elsewhere classified: Secondary | ICD-10-CM

## 2023-11-07 DIAGNOSIS — M79662 Pain in left lower leg: Secondary | ICD-10-CM | POA: Diagnosis not present

## 2023-11-07 DIAGNOSIS — M79661 Pain in right lower leg: Secondary | ICD-10-CM

## 2023-11-07 NOTE — Therapy (Signed)
 OUTPATIENT PHYSICAL THERAPY LOWER EXTREMITY TREATMENT    Patient Name: Todd Larson MRN: [id number] DOB:01-29-1940, 83 y.o., male Today's Date: 11/07/2023  END OF SESSION:  PT End of Session - 11/07/23 1134     Visit Number 12    Date for Recertification  11/21/23    Authorization Type Medicare    PT Start Time 1135    PT Stop Time 1215    PT Time Calculation (min) 40 min          Past Medical History:  Diagnosis Date   Arthritis    cervical spine - T7-8 & lumbar area - bone spurs up & down the back    Bulging discs     thoracic spine   Diverticulosis of colon    GERD (gastroesophageal reflux disease)    related to intestinal system, - has started Align  4-5 days ago-    Hyperlipidemia    Hypertension    Inguinal hernia    OSA (obstructive sleep apnea)    saw Dr. Neysa- 02/2015- pt. remarks that as a result BP med. was changed , pt. has a sleep number bed & he reports the score averages in the 80% range    PSA elevation    History of    Recurrent sinus infections    related to fracture nose x2, also reports that he has a deviated septum   Past Surgical History:  Procedure Laterality Date   COLONOSCOPY  2008   3 internal hemorrhoids, diverticulosis;Todd Larson   INSERTION OF MESH N/A 11/13/2016   Procedure: INSERTION OF MESH;  Surgeon: Todd Calamity, MD;  Location: [facility] OR;  Service: General;  Laterality: N/A;   LAPAROSCOPIC INGUINAL HERNIA WITH UMBILICAL HERNIA Left 11/13/2016   Procedure: LAPAROSCOPIC LEFT INGUINAL HERNIA  AND UMBILICAL HERNIA REPAIRS WITH MESH;  Surgeon: Todd Calamity, MD;  Location: [facility] OR;  Service: General;  Laterality: Left;   PROSTATE BIOPSY     x 2, once abnormal, once normal   TONSILLECTOMY     Patient Active Problem List   Diagnosis Date Noted   Right knee meniscal tear 06/01/2015   Obstructive sleep apnea 02/07/2015   Thoracic spine pain 03/29/2014   Tenosynovitis of thumb 03/29/2014   Cervical radiculopathy due to degenerative  joint disease of spine 10/10/2013   Bursitis of left shoulder 09/23/2013   Chronic low back pain 12/15/2012   Chronic cervical pain 06/09/2012   Hyperlipidemia 07/06/2010   Essential hypertension 11/03/2009   Inguinal hernia 11/03/2009   Nonallergic rhinitis 08/24/2009   Ventral hernia 05/20/2009   Diverticulosis of colon 05/20/2009   Benign neoplasm of skin 10/26/2008   GERD 10/01/2006    PCP: Todd Huxley NP  REFERRING PROVIDER: Joane Birmingham, MD  REFERRING DIAG: lumbar radiculitis  THERAPY DIAG:  Difficulty in walking, not elsewhere classified  Pain in left lower leg  Pain in right lower leg  Rationale for Evaluation and Treatment: Rehabilitation  ONSET DATE: 6 months march 2025  SUBJECTIVE:   SUBJECTIVE STATEMENT:  back inj did not help at all. Knees Left> RT pain esp with twisting  EVAL:  For 8 months I walked 30 min, every day, now something has changed and I just can't do it , it feels like my legs are logs or full of water  PERTINENT HISTORY: Reports wakes up every morning with pain B lower legs, ant and lateral lower legs, also same pain pattern with walking .  Referred by sports med MD  PAIN:  Are you  having pain?  3/10 L distal leg  PRECAUTIONS: None  RED FLAGS: None   WEIGHT BEARING RESTRICTIONS: No  FALLS:  Has patient fallen in last 6 months? No  LIVING ENVIRONMENT: Lives with: lives alone Lives in: House/apartment Stairs: No Has following equipment at home: Single point cane  OCCUPATION: retired, does foster cats  PLOF: Independent  PATIENT GOALS: be able to walk 30 min to get my activity level also wake up without pain  NEXT MD VISIT: 09/26/23  OBJECTIVE:  Note: Objective measures were completed at Evaluation unless otherwise noted.  DIAGNOSTIC FINDINGS: x rays with arthritic changes L5/S1  PATIENT SURVEYS:  PSFS: THE PATIENT SPECIFIC FUNCTIONAL SCALE  Place score of 0-10 (0 = unable to perform activity and 10 = able to perform  activity at the same level as before injury or problem)  Activity Date: 09/25/23    Walk greater than 20 min 2    2.stand from sleeping/lying down position  0    3.ascend/ descend 4 steps  0         Total Score 2      Total Score = Sum of activity scores/number of activities  Minimally Detectable Change: 3 points (for single activity); 2 points (for average score)  [facility] (nd). The Patient Specific Functional Scale . Retrieved from [email or web address]   COGNITION: Overall cognitive status: Within functional limits for tasks assessed     SENSATION: WFL  EDEMA:  None noted  POSTURE: loss of lumbar lordosis, Er B hips, post pelvic tilt in standing and with gait  PALPATION: Non tender over post hips, tender with PA glide L2/3 Non tender B lower legs, ant, post musculature  LOWER EXTREMITY ROM:wfl B LUMBAR ROM: flex wnl, ext 50%, sb R with pain central lumbar, L sb wnl  LOWER EXTREMITY MMT:  MMT Right eval Left eval  Hip flexion wfl wfl  Hip extension    Hip abduction wfl wfl  Hip adduction    Hip internal rotation    Hip external rotation    Knee flexion    Knee extension    Ankle dorsiflexion    Ankle plantarflexion toe walking 4 3-  Ankle inversion    Ankle eversion     (Blank rows = not tested)  LOWER EXTREMITY SPECIAL TESTS:  na  FUNCTIONAL TESTS:  30 sec sit to sit 13 reps    GAIT: Distance walked: 93' in clinic, no deficits noted, no device                                                                                                                               TREATMENT DATE:   11/07/23 Walk outside 8 min working on increased paced, struggled with increased pace and on inclines. Fwd head, increased lumbar lordosis and kyphosis HS curl 35# 10x, 25# 10 x Knee ext 10# 2 sets 10 Seated row 25#  2 sets 10  Lats 25# 2  sets 10 Black tband trunk ext 20X Leg press 30# 10x then  decrease 20# 10 x Black bar DF and PF 2 sets 10 Attempted but increased left knee pain and uncomfortable to did not continue-Red tband SL flex,ext and abd  PROM BIL LE and trunk    11/05/23 Nustep level 5 x 5 minutes Gait outside 2 laps no rest Seated row 20# Lats 20# Passive LE stretches STM to the HS  10/31/23 Gait around the building, fast pace no rest around the building Nutep level 5 x 6 minutes Back to wall red tband horizontal abduction 20# lats Slant board stretch 5# straight arm pulls STM to the upper traps Supine Feet on ball K2C, rotation, bridge, isometric abs Passive LE stretch, trunk strethces  10/29/23 Nustep L 6 -  can feel this more today in LB Seated row and lat pull down 25# 2 sets 10 Black tband trunk ext 2 sets 10 Shoulder ER green 2x10 - at wall Cervical retractions - at wall Shoulder horiz abd green 2x10 - at wall Ball vs wall push ups 5 x CC and CCW Seated isometric abdominals 2 sets 10 Supine feet on ball bridge,KTC and obl LE PROM and trunk    10/24/23 Feet on ball K2C, small rotations, posterior isometrics, isometric abs PPT PAssive stretches calves, HS, piriformis, adductors, quads  10/22/23 NuStep L 5 x 6 min  Shoulder ER green 2x10  Cervical retractions  Shoulder horiz abd green 2x10  Seated OHP blue ball 2x10 Sit to stand holding blue ball 2x12 Bridges x15  W/ March    Stopped after a few reps due to L HS cramping  10/17/23 Nustep L 5 Gait around the back building around widest part of parking lor Seated Rows & Lats 35lb 2x12 Shoulder Ext 10lb 2x10 Lumbar ROM  WFL  S2S OHP yellow ball 2x10 Bridges x10 Feet on ball bridge, KTC,SLR,obl and iso abdominals PROM LE and trunk w/ end range holds   10/15/23 Nustep L 5 Standing HS stretch 20 sec 2 x BIL Standing step calf stretch 20 sec 3 x Black bar heel raises and toe raises 20 x 25# lat pull down and seated row 2 set 10 Black tband trunk flex and ext 20  x Wt ball trunk ext 10 x, trunk rotation 10 x each way Standing red tband hip ext and abd 2 sets 10- c/o increased increased pain with SLS Feet on ball bridge, KTC,SLR,obl and iso abdominals PROM LE and trunk   10/08/23 Nustep L 5 Slant board 3 x 20 s Black bar heel raises and toe raises 20 x each Leg press 30# 2 sets 10 Isometric abdominals 10 x hold 5 sec STS with wt ball 3 sets 5, yellow ball chest press 20# lats and rows 2 sets 10 Black tband trunk flex and ext 20 x Passive stretching LE and trunk   10/03/23 Nustep level 5 x 5 minutes Reviewed HEP gave cues Gait outside around the back building and then to our front door, took 7 minutes and wsa tired in the legs, no extra pain  STS 2x 5 with weighted ball 20# lats 2x10 20# rows 2x10 Slant board stretch 3 x20 s LEg press 20# 2x10 Back to wall postural alignment and trying to hold 15-20 seconds Feet on ball K2C, rotation, bridge, isometric abs Passive LE stretch  09/27/23 Nustep level 5 x 6 minutes 20# Row 20# Lats Slant board stretch REviewed and performed HEP with him, needed  a lot of cues due to his tightness Passive HS stretch a lot of cues needed to relax, very very tight Passive piriformis stretch Feet on ball K2C, rotation, posterior activation, isometric abs Added HEP and performed  09/26/23  Eval, education in anatomy of spine, positioning to decompress lumbar spine,  Education in sleeping position to reduce excessive side bending lumbar Inst in 2 ex indicated below to improve lumbar spine mobility and L ankle plantarflexion strength  PATIENT EDUCATION:  Education details: POC, goals Person educated: Patient Education method: Explanation, Demonstration, Tactile cues, Verbal cues, and Handouts Education comprehension: verbalized understanding  HOME EXERCISE PROGRAM: Access Code: [email or web address] URL: [email or web address] Date: 09/25/2023 Prepared by: Amy Speaks  Exercises - Supine Lower  Trunk Rotation  - 1 x daily - 7 x weekly - 3 sets - 10 reps - Long Sitting Ankle Plantar Flexion with Resistance  - 1 x daily - 7 x weekly - 3 sets - 10 reps Access Code: [email or web address] URL: [email or web address] Date: 09/27/2023 Prepared by: Ozell Mainland  Exercises - Hooklying Single Knee to Chest Stretch  - 2 x daily - 7 x weekly - 1 sets - 10 reps - 5 hold - Seated Table Hamstring Stretch  - 2 x daily - 7 x weekly - 1 sets - 5 reps - 30 hold - Seated Hamstring Stretch  - 2 x daily - 7 x weekly - 1 sets - 5 reps - 30 hold - Seated Hamstring Stretch with Chair  - 2 x daily - 7 x weekly - 1 sets - 5 reps - 30 hold - Supine Piriformis Stretch Pulling Heel to Hip  - 2 x daily - 7 x weekly - 1 sets - 5 reps - 30 hold ASSESSMENT:  CLINICAL IMPRESSION   Patient reports no real changes with the injection, knee pain Left > RT. Walk outside 8 min working on increased paced, struggled with increased pace and on inclines. Fwd head, increased lumbar lordosis and kyphosis. Increased Left knee paia nd c/o instability Goals assessed and documented. OBJECTIVE IMPAIRMENTS: decreased balance, decreased endurance, decreased mobility, difficulty walking, decreased strength, hypomobility, and pain.   ACTIVITY LIMITATIONS: sleeping, stairs, transfers, bed mobility, and locomotion level  PARTICIPATION LIMITATIONS: laundry, shopping, and community activity  PERSONAL FACTORS: Age, Behavior pattern, Fitness, Time since onset of injury/illness/exacerbation, and 1 comorbidity: cervical radiculopathy are also affecting patient's functional outcome.   REHAB POTENTIAL: Good  CLINICAL DECISION MAKING: Evolving/moderate complexity  EVALUATION COMPLEXITY: Moderate   GOALS: Goals reviewed with patient? Yes  SHORT TERM GOALS: Target date: 2 weeks ,10/09/23 I HEP Baseline: Goal status:progressing 10/03/23   11/07/23 MET   LONG TERM GOALS: Target date: 8 weeks 11/21/23  Able to walk 30 min 3 to 5 x  week without interference from B lower leg pain Baseline:  Goal status: Progressing 15-20 min a day 10/31/23  and 11/07/23  2.  Improve B plantarflexor strength L from 3+/5  and R form 4/5 to 5/5 for improved balance Baseline:  Goal status:progressing 11/05/23  3.  Improve lumbar side bending to R to wnl without provocation of Sx Baseline:  Goal status: 10/17/23 Met    PLAN:  PT FREQUENCY: 2x/week  PT DURATION: 8 weeks  PLANNED INTERVENTIONS: 97110-Therapeutic exercises, 97530- Therapeutic activity, [id number]- Neuromuscular re-education, 97535- Self Care, 02859- Manual therapy, [id number]- Electrical stimulation (unattended), 97035- Ultrasound, [id number]- Traction (mechanical), and Patient/Family education  PLAN FOR NEXT SESSION: Continue with posture, flexibility and strength  Makael Stein,ANGIE, PTA, 11/07/2023, 12:02 PM

## 2023-11-11 ENCOUNTER — Ambulatory Visit: Payer: Self-pay

## 2023-11-11 NOTE — Telephone Encounter (Signed)
 FYI Only or Action Required?: FYI only for provider: UC advised today. Pt declines UC today. Scheduled for UC visit tomorrow morning.  Patient was last seen in primary care on 09/25/2023 by Merna Huxley, NP.  Called Nurse Triage reporting Animal Bite (Left leg).  Symptoms began today.  Interventions attempted: Nothing.  Symptoms are: stable.  Triage Disposition: See HCP Within 4 Hours (Or PCP Triage)  Patient/caregiver understands and will follow disposition?: No, refuses disposition  Copied from CRM [id number]. Topic: Clinical - Red Word Triage >> Nov 11, 2023  5:46 PM Dedra B wrote: [facility] that prompted transfer to Nurse Triage: Pt said a stray cat he was caring for punctured his L leg. Warm transfer to NT. Reason for Disposition  [1] Puncture wound (hole through the skin) AND [2] from a cat bite (or deep claw puncture wound)  Answer Assessment - Initial Assessment Questions Pt reports his pet cats tooth accidentally pierced the skin on his shin while her was licking him. Very small pin prick, no fever, redness or swelling. Reports cat vaccinated against rabies on 10/20 and pt up to date on their tetanus shot. Advised UC today, pt declines. Advised pt not delay care, pt states he would rather not drive at night. Scheduled for UC visit tomorrow morning to be evaluated and to go to UC or ED for worsening symptoms.  1. APPEARANCE What does it look like?  (e.g., abrasion, bruise, puncture)      No redness or swelling, just a small prick  2. SIZE: How big is the bite? (e.g., inches, cm; or compare to size of coin, pea, grape, ping pong ball)      Small pinprick  3. LOCATION: Where is the bite located?      Front of left shin  4. ONSET: When did the bite happen? (e.g., minutes, hours ago)      This morning  5. ANIMAL: What type of animal caused the bite? Is the injury from a bite or a claw? If the animal is a dog or a cat, ask: Was it a pet or a stray? Was it acting  ill or behaving strangely?     Cat. Cat FIV positive.  6. RABIES VACCINE: For dog or cat bites, ask: Do you know if the pet is vaccinated against rabies?  (e.g., yes, no, overdue for rabies shot, unknown)     Vaccinated on 10/30  7. CIRCUMSTANCES: Tell me how this happened.      Cat was licking pt and tooth accidentally knicked leg  8. TETANUS: When was your last tetanus booster?     2024  Protocols used: Animal Bite-A-AH

## 2023-11-12 ENCOUNTER — Other Ambulatory Visit: Payer: Self-pay

## 2023-11-12 ENCOUNTER — Ambulatory Visit: Admitting: Physical Therapy

## 2023-11-12 ENCOUNTER — Ambulatory Visit

## 2023-11-12 ENCOUNTER — Ambulatory Visit
Admission: RE | Admit: 2023-11-12 | Discharge: 2023-11-12 | Disposition: A | Discharge status: Home / Self Care | Payer: Self-pay | Source: Ambulatory Visit | Attending: Family Medicine | Admitting: Family Medicine

## 2023-11-12 VITALS — BP 131/64 | HR 100 | Temp 98.4°F | Resp 17

## 2023-11-12 DIAGNOSIS — M79661 Pain in right lower leg: Secondary | ICD-10-CM

## 2023-11-12 DIAGNOSIS — Z203 Contact with and (suspected) exposure to rabies: Secondary | ICD-10-CM

## 2023-11-12 DIAGNOSIS — W5501XA Bitten by cat, initial encounter: Secondary | ICD-10-CM

## 2023-11-12 DIAGNOSIS — R262 Difficulty in walking, not elsewhere classified: Secondary | ICD-10-CM

## 2023-11-12 DIAGNOSIS — M79662 Pain in left lower leg: Secondary | ICD-10-CM

## 2023-11-12 NOTE — ED Provider Notes (Signed)
 [facility]    CSN: [id number] Arrival date & time: 11/12/23  0806      History   Chief Complaint Chief Complaint  Patient presents with   Puncture Wound    Cat Bite on 11/10 - Entered by patient    HPI Todd Larson is a 83 y.o. male presents for cat bite.  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  Yesterday when it was rubbing up against his lower leg his tooth caught his shin causing a small puncture.  He reports he had taken the cat to the vet on the day he found him and he started the rabies vaccine series.  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  patient states there is no test for rabies when I asked if the cat was tested.  Patient is unsure if he should get the rabies vaccine.  He states the cat shows no rabid behavior.  Patient states he is up-to-date on his tetanus.  He states he spoke with the vet as well as animal control but nobody wants to give him a recommendation on whether he should get the vaccine.  No other concerns at this time  HPI  Past Medical History:  Diagnosis Date   Arthritis    cervical spine - T7-8 & lumbar area - bone spurs up & down the back    Bulging discs     thoracic spine   Diverticulosis of colon    GERD (gastroesophageal reflux disease)    related to intestinal system, - has started Align  4-5 days ago-    Hyperlipidemia    Hypertension    Inguinal hernia    OSA (obstructive sleep apnea)    saw Dr. Neysa- 02/2015- pt. remarks that as a result BP med. was changed , pt. has a sleep number bed & he reports the score averages in the 80% range    PSA elevation    History of    Recurrent sinus infections    related to fracture nose x2, also reports that he has a deviated septum    Patient Active Problem List   Diagnosis Date Noted   Right knee meniscal tear 06/01/2015   Obstructive sleep apnea 02/07/2015   Thoracic spine pain 03/29/2014   Tenosynovitis of thumb 03/29/2014   Cervical  radiculopathy due to degenerative joint disease of spine 10/10/2013   Bursitis of left shoulder 09/23/2013   Chronic low back pain 12/15/2012   Chronic cervical pain 06/09/2012   Hyperlipidemia 07/06/2010   Essential hypertension 11/03/2009   Inguinal hernia 11/03/2009   Nonallergic rhinitis 08/24/2009   Ventral hernia 05/20/2009   Diverticulosis of colon 05/20/2009   Benign neoplasm of skin 10/26/2008   GERD 10/01/2006    Past Surgical History:  Procedure Laterality Date   COLONOSCOPY  2008   3 internal hemorrhoids, diverticulosis;Dr.Jacobs   INSERTION OF MESH N/A 11/13/2016   Procedure: INSERTION OF MESH;  Surgeon: Rubin Calamity, MD;  Location: [facility] OR;  Service: General;  Laterality: N/A;   LAPAROSCOPIC INGUINAL HERNIA WITH UMBILICAL HERNIA Left 11/13/2016   Procedure: LAPAROSCOPIC LEFT INGUINAL HERNIA  AND UMBILICAL HERNIA REPAIRS WITH MESH;  Surgeon: Rubin Calamity, MD;  Location: [facility] OR;  Service: General;  Laterality: Left;   PROSTATE BIOPSY     x 2, once abnormal, once normal   TONSILLECTOMY         Home Medications    Prior to Admission medications   Medication Sig Start  Date End Date Taking? Authorizing Provider  Blood Pressure Monitoring (ADULT BLOOD PRESSURE CUFF LG) KIT To monitor Blood pressure cuff 05/21/23   Nafziger, Darleene, NP  carbamide peroxide (DEBROX) 6.5 % OTIC solution Place 5 drops into both ears daily as needed. 03/04/22   [name], PA-C  Cholecalciferol (VITAMIN D -3 PO) Take 2,000 Units by mouth daily at 12 noon.    [provider]  finasteride (PROSCAR) 5 MG tablet Take 5 mg by mouth daily.    [provider]  fluticasone  (FLONASE ) 50 MCG/ACT nasal spray USE 1 SPRAY(S) IN EACH NOSTRIL TWICE DAILY AS NEEDED FOR RUNNY NOSE 05/18/22   Nafziger, Darleene, NP  hydrochlorothiazide  (HYDRODIURIL ) 12.5 MG tablet Take 1 tablet (12.5 mg total) by mouth daily. 09/25/23   Nafziger, Darleene, NP  loratadine  (CLARITIN ) 10 MG tablet Take 1 tablet (10 mg  total) by mouth daily. 04/23/23   [name], PA-C  losartan  (COZAAR ) 50 MG tablet Take 1 tablet (50 mg total) by mouth daily. 08/13/23   Nafziger, Darleene, NP  Multiple Vitamins-Minerals (ONE A DAY MEN 50 PLUS PO) Take by mouth daily.    [provider]  Polyvinyl Alcohol-Povidone (REFRESH OP) Place 1 drop as needed into both eyes (for dry eyes).    [provider]    Family History Family History  Problem Relation Age of Onset   Lung cancer Maternal Grandfather        enviromental    Heart attack Paternal Uncle    Prostate cancer Brother     Social History Social History   Tobacco Use   Smoking status: Never   Smokeless tobacco: Never  Vaping Use   Vaping status: Never Used  Substance Use Topics   Alcohol use: Yes    Alcohol/week: 14.0 standard drinks of alcohol    Types: 14 Glasses of wine per week   Drug use: No     Allergies   Amoxicillin -pot clavulanate   Review of Systems Review of Systems  Skin:  Positive for wound.     Physical Exam Triage Vital Signs ED Triage Vitals  Encounter Vitals Group     BP 11/12/23 0815 131/64     Girls Systolic BP Percentile --      Girls Diastolic BP Percentile --      Boys Systolic BP Percentile --      Boys Diastolic BP Percentile --      Pulse Rate 11/12/23 0815 100     Resp 11/12/23 0815 17     Temp 11/12/23 0815 98.4 F (36.9 C)     Temp Source 11/12/23 0815 Oral     SpO2 11/12/23 0815 97 %     Weight --      Height --      Head Circumference --      Peak Flow --      Pain Score 11/12/23 0813 0     Pain Loc --      Pain Education --      Exclude from Growth Chart --    No data found.  Updated Vital Signs BP 131/64   Pulse 100   Temp 98.4 F (36.9 C) (Oral)   Resp 17   SpO2 97%   Visual Acuity Right Eye Distance:   Left Eye Distance:   Bilateral Distance:    Right Eye Near:   Left Eye Near:    Bilateral Near:     Physical Exam Vitals and nursing note reviewed.  Constitutional:  General: He is not in acute distress.    Appearance: Normal appearance. He is not ill-appearing.  HENT:     Head: Normocephalic and atraumatic.  Eyes:     Pupils: Pupils are equal, round, and reactive to light.  Cardiovascular:     Rate and Rhythm: Normal rate.  Pulmonary:     Effort: Pulmonary effort is normal.  Skin:    General: Skin is warm and dry.         Comments: There is a single scabbed puncture wound to the left shin.  There is no drainage, erythema, warmth or swelling.  Nontender to palpation.  Neurological:     General: No focal deficit present.     Mental Status: He is alert and oriented to person, place, and time.  Psychiatric:        Mood and Affect: Mood normal.        Behavior: Behavior normal.      UC Treatments / Results  Labs (all labs ordered are listed, but only abnormal results are displayed) Labs Reviewed - No data to display  EKG   Radiology No results found.  Procedures Procedures (including critical care time)  Medications Ordered in UC Medications - No data to display  Initial Impression / Assessment and Plan / UC Course  I have reviewed the triage vital signs and the nursing notes.  Pertinent labs & imaging results that were available during my care of the patient were reviewed by me and considered in my medical decision making (see chart for details).     Had long discussion with patient.  He is up-to-date on his tetanus.  I advised that he should get the rabies vaccine as cat is a stray and also has a feline immunodeficiency virus and the fact that the rabies virus is fatal.  Also discussed prophylactic antibiotics for his puncture wound.  He declines both the vaccine and antibiotics at this time.  States he wants to think about it before proceeding as well as contact his insurance company to see what cost would be.  Highly encouraged patient to make a decision sooner rather than later and he verbalized understanding of risks of not  getting the vaccine. Final Clinical Impressions(s) / UC Diagnoses   Final diagnoses:  Cat bite, initial encounter   Discharge Instructions   None    ED Prescriptions   None    PDMP not reviewed this encounter.   Loreda Myla SAUNDERS, NP 11/12/23 [phone]

## 2023-11-12 NOTE — ED Triage Notes (Addendum)
 Pt states a stray cat he took in on 10/30 and yesterday when cat was rubbing against his left lower leg it's tooth got caught in his leg. Pt has small bite mark on leg. Pt states he took the cat to vet 10/30 and he tested positive for feline immune deficiency virus. He states the cat was vaccinated for everything that day, including rabies. Pt states he heard it takes 28 days for the rabies vaccine to take effect in an animal and is wandering if he needs to get rabies vaccine

## 2023-11-12 NOTE — Discharge Instructions (Signed)
 I recommend that you get the rabies vaccine due to the cat bite

## 2023-11-12 NOTE — Therapy (Signed)
 OUTPATIENT PHYSICAL THERAPY LOWER EXTREMITY TREATMENT    Patient Name: Todd Larson MRN: [id number] DOB:1940/12/09, 83 y.o., male Today's Date: 11/12/2023  END OF SESSION:  PT End of Session - 11/12/23 1053     Visit Number 13    Date for Recertification  11/21/23    Authorization Type Medicare    PT Start Time 1055    PT Stop Time 1140    PT Time Calculation (min) 45 min          Past Medical History:  Diagnosis Date   Arthritis    cervical spine - T7-8 & lumbar area - bone spurs up & down the back    Bulging discs     thoracic spine   Diverticulosis of colon    GERD (gastroesophageal reflux disease)    related to intestinal system, - has started Align  4-5 days ago-    Hyperlipidemia    Hypertension    Inguinal hernia    OSA (obstructive sleep apnea)    saw Dr. Neysa- 02/2015- pt. remarks that as a result BP med. was changed , pt. has a sleep number bed & he reports the score averages in the 80% range    PSA elevation    History of    Recurrent sinus infections    related to fracture nose x2, also reports that he has a deviated septum   Past Surgical History:  Procedure Laterality Date   COLONOSCOPY  2008   3 internal hemorrhoids, diverticulosis;Dr.Jacobs   INSERTION OF MESH N/A 11/13/2016   Procedure: INSERTION OF MESH;  Surgeon: Rubin Calamity, MD;  Location: [facility] OR;  Service: General;  Laterality: N/A;   LAPAROSCOPIC INGUINAL HERNIA WITH UMBILICAL HERNIA Left 11/13/2016   Procedure: LAPAROSCOPIC LEFT INGUINAL HERNIA  AND UMBILICAL HERNIA REPAIRS WITH MESH;  Surgeon: Rubin Calamity, MD;  Location: [facility] OR;  Service: General;  Laterality: Left;   PROSTATE BIOPSY     x 2, once abnormal, once normal   TONSILLECTOMY     Patient Active Problem List   Diagnosis Date Noted   Right knee meniscal tear 06/01/2015   Obstructive sleep apnea 02/07/2015   Thoracic spine pain 03/29/2014   Tenosynovitis of thumb 03/29/2014   Cervical radiculopathy due to degenerative  joint disease of spine 10/10/2013   Bursitis of left shoulder 09/23/2013   Chronic low back pain 12/15/2012   Chronic cervical pain 06/09/2012   Hyperlipidemia 07/06/2010   Essential hypertension 11/03/2009   Inguinal hernia 11/03/2009   Nonallergic rhinitis 08/24/2009   Ventral hernia 05/20/2009   Diverticulosis of colon 05/20/2009   Benign neoplasm of skin 10/26/2008   GERD 10/01/2006    PCP: Merna Huxley NP  REFERRING PROVIDER: Joane Birmingham, MD  REFERRING DIAG: lumbar radiculitis  THERAPY DIAG:  Difficulty in walking, not elsewhere classified  Pain in left lower leg  Pain in right lower leg  Rationale for Evaluation and Treatment: Rehabilitation  ONSET DATE: 6 months march 2025  SUBJECTIVE:   SUBJECTIVE STATEMENT:  Wild cat and it got it and I almost fell over it and twisted knee. It did nip me and now they want me to start rabies shots- not sure if shots will effect therapy? Probably start this afternoon.    EVAL:  For 8 months I walked 30 min, every day, now something has changed and I just can't do it , it feels like my legs are logs or full of water  PERTINENT HISTORY: Reports wakes up every morning with  pain B lower legs, ant and lateral lower legs, also same pain pattern with walking .  Referred by sports med MD  PAIN:  Are you having pain?  3/10 L distal leg  PRECAUTIONS: None  RED FLAGS: None   WEIGHT BEARING RESTRICTIONS: No  FALLS:  Has patient fallen in last 6 months? No  LIVING ENVIRONMENT: Lives with: lives alone Lives in: House/apartment Stairs: No Has following equipment at home: Single point cane  OCCUPATION: retired, does foster cats  PLOF: Independent  PATIENT GOALS: be able to walk 30 min to get my activity level also wake up without pain  NEXT MD VISIT: 09/26/23  OBJECTIVE:  Note: Objective measures were completed at Evaluation unless otherwise noted.  DIAGNOSTIC FINDINGS: x rays with arthritic changes L5/S1  PATIENT  SURVEYS:  PSFS: THE PATIENT SPECIFIC FUNCTIONAL SCALE  Place score of 0-10 (0 = unable to perform activity and 10 = able to perform activity at the same level as before injury or problem)  Activity Date: 09/25/23    Walk greater than 20 min 2    2.stand from sleeping/lying down position  0    3.ascend/ descend 4 steps  0         Total Score 2      Total Score = Sum of activity scores/number of activities  Minimally Detectable Change: 3 points (for single activity); 2 points (for average score)  [name] (nd). The Patient Specific Functional Scale . Retrieved from [email or web address]   COGNITION: Overall cognitive status: Within functional limits for tasks assessed     SENSATION: WFL  EDEMA:  None noted  POSTURE: loss of lumbar lordosis, Er B hips, post pelvic tilt in standing and with gait  PALPATION: Non tender over post hips, tender with PA glide L2/3 Non tender B lower legs, ant, post musculature  LOWER EXTREMITY ROM:wfl B LUMBAR ROM: flex wnl, ext 50%, sb R with pain central lumbar, L sb wnl  LOWER EXTREMITY MMT:  MMT Right eval Left eval  Hip flexion wfl wfl  Hip extension    Hip abduction wfl wfl  Hip adduction    Hip internal rotation    Hip external rotation    Knee flexion    Knee extension    Ankle dorsiflexion    Ankle plantarflexion toe walking 4 3-  Ankle inversion    Ankle eversion     (Blank rows = not tested)  LOWER EXTREMITY SPECIAL TESTS:  na  FUNCTIONAL TESTS:  30 sec sit to sit 13 reps    GAIT: Distance walked: 7' in clinic, no deficits noted, no device                                                                                                                               TREATMENT DATE:   11/12/23 Nustep L 5 Standing on foam mat shld ext and rows 2 sets 10 Upright row with trunk  ext 2 sets 10 6 inch step up opp leg ext 10 x BIL with UE  support Lats 25# 2 sets 10 Black tband trunk ext 20X STS with wt ball OH 10 x Feet on ball bridge,obl,KTC. Iso abdominals 15 x Red tband SLR 10x BIL then SLR with abd 10 x ( had to take band off LLE d/t pain and weakness) PROM BIL LE and trunk  11/07/23 Walk outside 8 min working on increased paced, struggled with increased pace and on inclines. Fwd head, increased lumbar lordosis and kyphosis HS curl 35# 10x, 25# 10 x Knee ext 10# 2 sets 10 Seated row 25#  2 sets 10  Lats 25# 2 sets 10 Black tband trunk ext 20X Leg press 30# 10x then decrease 20# 10 x Black bar DF and PF 2 sets 10 Attempted but increased left knee pain and uncomfortable to did not continue-Red tband SL flex,ext and abd  PROM BIL LE and trunk    11/05/23 Nustep level 5 x 5 minutes Gait outside 2 laps no rest Seated row 20# Lats 20# Passive LE stretches STM to the HS  10/31/23 Gait around the building, fast pace no rest around the building Nutep level 5 x 6 minutes Back to wall red tband horizontal abduction 20# lats Slant board stretch 5# straight arm pulls STM to the upper traps Supine Feet on ball K2C, rotation, bridge, isometric abs Passive LE stretch, trunk strethces  10/29/23 Nustep L 6 -  can feel this more today in LB Seated row and lat pull down 25# 2 sets 10 Black tband trunk ext 2 sets 10 Shoulder ER green 2x10 - at wall Cervical retractions - at wall Shoulder horiz abd green 2x10 - at wall Ball vs wall push ups 5 x CC and CCW Seated isometric abdominals 2 sets 10 Supine feet on ball bridge,KTC and obl LE PROM and trunk    10/24/23 Feet on ball K2C, small rotations, posterior isometrics, isometric abs PPT PAssive stretches calves, HS, piriformis, adductors, quads  10/22/23 NuStep L 5 x 6 min  Shoulder ER green 2x10  Cervical retractions  Shoulder horiz abd green 2x10  Seated OHP blue ball 2x10 Sit to stand holding blue ball 2x12 Bridges x15  W/ March    Stopped after  a few reps due to L HS cramping  10/17/23 Nustep L 5 Gait around the back building around widest part of parking lor Seated Rows & Lats 35lb 2x12 Shoulder Ext 10lb 2x10 Lumbar ROM  WFL  S2S OHP yellow ball 2x10 Bridges x10 Feet on ball bridge, KTC,SLR,obl and iso abdominals PROM LE and trunk w/ end range holds   10/15/23 Nustep L 5 Standing HS stretch 20 sec 2 x BIL Standing step calf stretch 20 sec 3 x Black bar heel raises and toe raises 20 x 25# lat pull down and seated row 2 set 10 Black tband trunk flex and ext 20 x Wt ball trunk ext 10 x, trunk rotation 10 x each way Standing red tband hip ext and abd 2 sets 10- c/o increased increased pain with SLS Feet on ball bridge, KTC,SLR,obl and iso abdominals PROM LE and trunk   10/08/23 Nustep L 5 Slant board 3 x 20 s Black bar heel raises and toe raises 20 x each Leg press 30# 2 sets 10 Isometric abdominals 10 x hold 5 sec STS with wt ball 3 sets 5, yellow ball chest press 20# lats and rows 2 sets 10  Black tband trunk flex and ext 20 x Passive stretching LE and trunk   10/03/23 Nustep level 5 x 5 minutes Reviewed HEP gave cues Gait outside around the back building and then to our front door, took 7 minutes and wsa tired in the legs, no extra pain  STS 2x 5 with weighted ball 20# lats 2x10 20# rows 2x10 Slant board stretch 3 x20 s LEg press 20# 2x10 Back to wall postural alignment and trying to hold 15-20 seconds Feet on ball K2C, rotation, bridge, isometric abs Passive LE stretch  09/27/23 Nustep level 5 x 6 minutes 20# Row 20# Lats Slant board stretch REviewed and performed HEP with him, needed a lot of cues due to his tightness Passive HS stretch a lot of cues needed to relax, very very tight Passive piriformis stretch Feet on ball K2C, rotation, posterior activation, isometric abs Added HEP and performed  09/26/23  Eval, education in anatomy of spine, positioning to decompress lumbar  spine,  Education in sleeping position to reduce excessive side bending lumbar Inst in 2 ex indicated below to improve lumbar spine mobility and L ankle plantarflexion strength  PATIENT EDUCATION:  Education details: POC, goals Person educated: Patient Education method: Explanation, Demonstration, Tactile cues, Verbal cues, and Handouts Education comprehension: verbalized understanding  HOME EXERCISE PROGRAM: Access Code: [email or web address] URL: [email or web address] Date: 09/25/2023 Prepared by: Amy Speaks  Exercises - Supine Lower Trunk Rotation  - 1 x daily - 7 x weekly - 3 sets - 10 reps - Long Sitting Ankle Plantar Flexion with Resistance  - 1 x daily - 7 x weekly - 3 sets - 10 reps Access Code: [email or web address] URL: [email or web address] Date: 09/27/2023 Prepared by: Ozell Mainland  Exercises - Hooklying Single Knee to Chest Stretch  - 2 x daily - 7 x weekly - 1 sets - 10 reps - 5 hold - Seated Table Hamstring Stretch  - 2 x daily - 7 x weekly - 1 sets - 5 reps - 30 hold - Seated Hamstring Stretch  - 2 x daily - 7 x weekly - 1 sets - 5 reps - 30 hold - Seated Hamstring Stretch with Chair  - 2 x daily - 7 x weekly - 1 sets - 5 reps - 30 hold - Supine Piriformis Stretch Pulling Heel to Hip  - 2 x daily - 7 x weekly - 1 sets - 5 reps - 30 hold ASSESSMENT:  CLINICAL IMPRESSION  Wild cat and it got it and I almost fell over it and twisted knee. It did nip me and now they want me to start rabies shots- not sure if shots will effect therapy? Probably start this afternoon. Some pain with left LE step ups. Struggled with supine LE tband ex esp on left- had to remove resistance.Cuing through out for posture and core activation OBJECTIVE IMPAIRMENTS: decreased balance, decreased endurance, decreased mobility, difficulty walking, decreased strength, hypomobility, and pain.   ACTIVITY LIMITATIONS: sleeping, stairs, transfers, bed mobility, and locomotion level  PARTICIPATION  LIMITATIONS: laundry, shopping, and community activity  PERSONAL FACTORS: Age, Behavior pattern, Fitness, Time since onset of injury/illness/exacerbation, and 1 comorbidity: cervical radiculopathy are also affecting patient's functional outcome.   REHAB POTENTIAL: Good  CLINICAL DECISION MAKING: Evolving/moderate complexity  EVALUATION COMPLEXITY: Moderate   GOALS: Goals reviewed with patient? Yes  SHORT TERM GOALS: Target date: 2 weeks ,10/09/23 I HEP Baseline: Goal status:progressing 10/03/23   11/07/23 MET   LONG TERM GOALS: Target date: 8 weeks 11/21/23  Able to walk  30 min 3 to 5 x week without interference from B lower leg pain Baseline:  Goal status: Progressing 15-20 min a day 10/31/23  and 11/07/23  2.  Improve B plantarflexor strength L from 3+/5  and R form 4/5 to 5/5 for improved balance Baseline:  Goal status:progressing 11/05/23  3.  Improve lumbar side bending to R to wnl without provocation of Sx Baseline:  Goal status: 10/17/23 Met    PLAN:  PT FREQUENCY: 2x/week  PT DURATION: 8 weeks  PLANNED INTERVENTIONS: 97110-Therapeutic exercises, 97530- Therapeutic activity, [id number]- Neuromuscular re-education, 97535- Self Care, [id number]- Manual therapy, [id number]- Electrical stimulation (unattended), 97035- Ultrasound, [id number]- Traction (mechanical), and Patient/Family education  PLAN FOR NEXT SESSION: Continue with posture, flexibility and strength  Marcine Gadway,ANGIE, PTA, 11/12/2023, 10:54 AM

## 2023-11-13 ENCOUNTER — Ambulatory Visit
Admission: RE | Admit: 2023-11-13 | Discharge: 2023-11-13 | Disposition: A | Discharge status: Home / Self Care | Source: Ambulatory Visit | Attending: Family Medicine | Admitting: Family Medicine

## 2023-11-13 VITALS — BP 126/67 | HR 103 | Temp 99.4°F | Resp 18 | Wt 213.5 lb

## 2023-11-13 DIAGNOSIS — Z2914 Encounter for prophylactic rabies immune globin: Secondary | ICD-10-CM

## 2023-11-13 DIAGNOSIS — Z23 Encounter for immunization: Secondary | ICD-10-CM | POA: Diagnosis not present

## 2023-11-13 DIAGNOSIS — W5501XA Bitten by cat, initial encounter: Secondary | ICD-10-CM

## 2023-11-13 DIAGNOSIS — Z203 Contact with and (suspected) exposure to rabies: Secondary | ICD-10-CM

## 2023-11-13 MED ORDER — RABIES VIRUS VACCINE, HDC IM SUSR
1.0000 mL | Freq: Once | INTRAMUSCULAR | Status: AC
  Administered 2023-11-13: 1 mL via INTRAMUSCULAR

## 2023-11-13 MED ORDER — RABIES IMMUNE GLOBULIN 300 UNIT/2ML IJ SOLN
20.0000 [IU]/kg | Freq: Once | INTRAMUSCULAR | Status: AC
  Administered 2023-11-13: 1800 [IU]

## 2023-11-13 NOTE — ED Provider Notes (Signed)
 [facility]  Note:  This document was prepared using Conservation officer, historic buildings and may include unintentional dictation errors.  MRN: [id number] DOB: 10-05-1940  Subjective:   Todd Larson is a 83 y.o. male presenting for recheck on rabies vaccination.  Patient suffered a cat bite to the left thigh from a stray cat he has been taking care of for the past 2 weeks.  The cat was taken in to the vet office and was administered rabies vaccination.  Patient did come into the clinic yesterday out of concern for rabies vaccination and wound infection from a cat bite.  Ultimately, patient decided not to have either the rabies vaccine and immunoglobulin or antibiotics for prophylactic prevention of an animal bite wound.  He ended up discussing his needs with multiple people and today reports he would like to pursue the treatment for rabies immunization.  Regarding the wound itself, no fever, drainage of pus or bleeding.  He has no pain about the wound.  No current facility-administered medications for this encounter.  Current Outpatient Medications:    Blood Pressure Monitoring (ADULT BLOOD PRESSURE CUFF LG) KIT, To monitor Blood pressure cuff, Disp: 1 kit, Rfl: 0   carbamide peroxide (DEBROX) 6.5 % OTIC solution, Place 5 drops into both ears daily as needed., Disp: 15 mL, Rfl: 0   Cholecalciferol (VITAMIN D -3 PO), Take 2,000 Units by mouth daily at 12 noon., Disp: , Rfl:    finasteride (PROSCAR) 5 MG tablet, Take 5 mg by mouth daily., Disp: , Rfl:    fluticasone  (FLONASE ) 50 MCG/ACT nasal spray, USE 1 SPRAY(S) IN EACH NOSTRIL TWICE DAILY AS NEEDED FOR RUNNY NOSE, Disp: 16 g, Rfl: 0   hydrochlorothiazide  (HYDRODIURIL ) 12.5 MG tablet, Take 1 tablet (12.5 mg total) by mouth daily., Disp: 90 tablet, Rfl: 3   loratadine  (CLARITIN ) 10 MG tablet, Take 1 tablet (10 mg total) by mouth daily., Disp: 30 tablet, Rfl: 0   losartan  (COZAAR ) 50 MG tablet, Take 1 tablet (50 mg total) by  mouth daily., Disp: 90 tablet, Rfl: 3   Multiple Vitamins-Minerals (ONE A DAY MEN 50 PLUS PO), Take by mouth daily., Disp: , Rfl:    Polyvinyl Alcohol-Povidone (REFRESH OP), Place 1 drop as needed into both eyes (for dry eyes)., Disp: , Rfl:    Allergies  Allergen Reactions   Amoxicillin -Pot Clavulanate Diarrhea and Other (See Comments)    Night sweats, not allergic to Amoxicillin , just can't tolerate augmentin    Past Medical History:  Diagnosis Date   Arthritis    cervical spine - T7-8 & lumbar area - bone spurs up & down the back    Bulging discs     thoracic spine   Diverticulosis of colon    GERD (gastroesophageal reflux disease)    related to intestinal system, - has started Align  4-5 days ago-    Hyperlipidemia    Hypertension    Inguinal hernia    OSA (obstructive sleep apnea)    saw Dr. Neysa- 02/2015- pt. remarks that as a result BP med. was changed , pt. has a sleep number bed & he reports the score averages in the 80% range    PSA elevation    History of    Recurrent sinus infections    related to fracture nose x2, also reports that he has a deviated septum     Past Surgical History:  Procedure Laterality Date   COLONOSCOPY  2008   3 internal hemorrhoids,  diverticulosis;Dr.Jacobs   INSERTION OF MESH N/A 11/13/2016   Procedure: INSERTION OF MESH;  Surgeon: Rubin Calamity, MD;  Location: [facility] OR;  Service: General;  Laterality: N/A;   LAPAROSCOPIC INGUINAL HERNIA WITH UMBILICAL HERNIA Left 11/13/2016   Procedure: LAPAROSCOPIC LEFT INGUINAL HERNIA  AND UMBILICAL HERNIA REPAIRS WITH MESH;  Surgeon: Rubin Calamity, MD;  Location: [facility] OR;  Service: General;  Laterality: Left;   PROSTATE BIOPSY     x 2, once abnormal, once normal   TONSILLECTOMY      Family History  Problem Relation Age of Onset   Lung cancer Maternal Grandfather        enviromental    Heart attack Paternal Uncle    Prostate cancer Brother     Social History   Tobacco Use   Smoking status:  Never   Smokeless tobacco: Never  Vaping Use   Vaping status: Never Used  Substance Use Topics   Alcohol use: Yes    Alcohol/week: 14.0 standard drinks of alcohol    Types: 14 Glasses of wine per week   Drug use: No    ROS   Objective:   Vitals: BP 126/67 (BP Location: Right Arm)   Pulse (!) 103   Temp 99.4 F (37.4 C) (Oral)   Resp 18   Wt 213 lb 8 oz (96.8 kg)   SpO2 95%   BMI 32.46 kg/m   Physical Exam Constitutional:      General: He is not in acute distress.    Appearance: Normal appearance. He is well-developed and normal weight. He is not ill-appearing, toxic-appearing or diaphoretic.  HENT:     Head: Normocephalic and atraumatic.     Right Ear: External ear normal.     Left Ear: External ear normal.     Nose: Nose normal.     Mouth/Throat:     Pharynx: Oropharynx is clear.  Eyes:     General: No scleral icterus.       Right eye: No discharge.        Left eye: No discharge.     Extraocular Movements: Extraocular movements intact.  Cardiovascular:     Rate and Rhythm: Normal rate.  Pulmonary:     Effort: Pulmonary effort is normal.  Musculoskeletal:     Cervical back: Normal range of motion.  Skin:     Neurological:     Mental Status: He is alert and oriented to person, place, and time.  Psychiatric:        Mood and Affect: Mood normal.        Behavior: Behavior normal.        Thought Content: Thought content normal.        Judgment: Judgment normal.       I personally infiltrated the wound with ~1cc mL rabies immunoglobulin.  The rest of the 11mL was administered to the thighs.  Assessment and Plan :   PDMP not reviewed this encounter.  1. Encounter for prophylactic administration of rabies immune globulin   2. Cat bite, initial encounter   3. Rabies vaccine administered    Rabies vaccination series and immunoglobulin started as above.  Timing for complete rabies vaccination reviewed with patient.  Will defer antibiotic use given a  well-appearing wound.  Counseled patient on potential for adverse effects with medications prescribed/recommended today, ER and return-to-clinic precautions discussed, patient verbalized understanding.    Todd Larson, [place] 11/13/23 [id number]

## 2023-11-13 NOTE — Discharge Instructions (Addendum)
 You have received the start of rabies vaccination. The dates are listed for you.

## 2023-11-13 NOTE — ED Triage Notes (Signed)
 Pt reports he need rabies shot as a straight cat he has , and scratched the ;eft lower leg with his tooth. Pt reports cat was vaccinated for Rabies on 10/31/23, takes 28 days for Rabies vaccine top work.   Pt is concern is he will need multiple shots he has problem in the left knee and pinch nerve in back, causes weakness in leg.

## 2023-11-14 ENCOUNTER — Other Ambulatory Visit: Payer: Self-pay

## 2023-11-14 ENCOUNTER — Ambulatory Visit: Admitting: Family Medicine

## 2023-11-14 ENCOUNTER — Encounter: Payer: Self-pay | Admitting: Physical Therapy

## 2023-11-14 ENCOUNTER — Ambulatory Visit: Admitting: Physical Therapy

## 2023-11-14 VITALS — BP 130/60 | HR 95 | Ht 68.0 in | Wt 215.0 lb

## 2023-11-14 DIAGNOSIS — M79662 Pain in left lower leg: Secondary | ICD-10-CM

## 2023-11-14 DIAGNOSIS — M25561 Pain in right knee: Secondary | ICD-10-CM

## 2023-11-14 DIAGNOSIS — M79661 Pain in right lower leg: Secondary | ICD-10-CM | POA: Diagnosis not present

## 2023-11-14 DIAGNOSIS — G8929 Other chronic pain: Secondary | ICD-10-CM

## 2023-11-14 DIAGNOSIS — M25562 Pain in left knee: Secondary | ICD-10-CM | POA: Diagnosis not present

## 2023-11-14 DIAGNOSIS — R262 Difficulty in walking, not elsewhere classified: Secondary | ICD-10-CM | POA: Diagnosis not present

## 2023-11-14 DIAGNOSIS — M5416 Radiculopathy, lumbar region: Secondary | ICD-10-CM

## 2023-11-14 NOTE — Patient Instructions (Addendum)
 Thank you for coming in today.   You received an injection today. Seek immediate medical attention if the joint becomes red, extremely painful, or is oozing fluid.   Let me know if you would like me to re-order the back injection

## 2023-11-14 NOTE — Therapy (Signed)
 OUTPATIENT PHYSICAL THERAPY LOWER EXTREMITY TREATMENT    Patient Name: Todd Larson MRN: [id number] DOB:08-01-1940, 83 y.o., male Today's Date: 11/14/2023  END OF SESSION:  PT End of Session - 11/14/23 1007     Visit Number 14    Date for Recertification  11/21/23    Authorization Type Medicare    PT Start Time 1006    PT Stop Time 1052    PT Time Calculation (min) 46 min    Activity Tolerance Patient tolerated treatment well    Behavior During Therapy WFL for tasks assessed/performed          Past Medical History:  Diagnosis Date   Arthritis    cervical spine - T7-8 & lumbar area - bone spurs up & down the back    Bulging discs     thoracic spine   Diverticulosis of colon    GERD (gastroesophageal reflux disease)    related to intestinal system, - has started Align  4-5 days ago-    Hyperlipidemia    Hypertension    Inguinal hernia    OSA (obstructive sleep apnea)    saw Dr. Neysa- 02/2015- pt. remarks that as a result BP med. was changed , pt. has a sleep number bed & he reports the score averages in the 80% range    PSA elevation    History of    Recurrent sinus infections    related to fracture nose x2, also reports that he has a deviated septum   Past Surgical History:  Procedure Laterality Date   COLONOSCOPY  2008   3 internal hemorrhoids, diverticulosis;Dr.Jacobs   INSERTION OF MESH N/A 11/13/2016   Procedure: INSERTION OF MESH;  Surgeon: Rubin Calamity, MD;  Location: [facility] OR;  Service: General;  Laterality: N/A;   LAPAROSCOPIC INGUINAL HERNIA WITH UMBILICAL HERNIA Left 11/13/2016   Procedure: LAPAROSCOPIC LEFT INGUINAL HERNIA  AND UMBILICAL HERNIA REPAIRS WITH MESH;  Surgeon: Rubin Calamity, MD;  Location: [facility] OR;  Service: General;  Laterality: Left;   PROSTATE BIOPSY     x 2, once abnormal, once normal   TONSILLECTOMY     Patient Active Problem List   Diagnosis Date Noted   Right knee meniscal tear 06/01/2015   Obstructive sleep apnea 02/07/2015    Thoracic spine pain 03/29/2014   Tenosynovitis of thumb 03/29/2014   Cervical radiculopathy due to degenerative joint disease of spine 10/10/2013   Bursitis of left shoulder 09/23/2013   Chronic low back pain 12/15/2012   Chronic cervical pain 06/09/2012   Hyperlipidemia 07/06/2010   Essential hypertension 11/03/2009   Inguinal hernia 11/03/2009   Nonallergic rhinitis 08/24/2009   Ventral hernia 05/20/2009   Diverticulosis of colon 05/20/2009   Benign neoplasm of skin 10/26/2008   GERD 10/01/2006    PCP: Merna Huxley NP  REFERRING PROVIDER: Joane Birmingham, MD  REFERRING DIAG: lumbar radiculitis  THERAPY DIAG:  Difficulty in walking, not elsewhere classified  Pain in left lower leg  Pain in right lower leg  Rationale for Evaluation and Treatment: Rehabilitation  ONSET DATE: 6 months march 2025  SUBJECTIVE:   SUBJECTIVE STATEMENT:  Patient has been taking rabies injections, he also had injection in the knee this AM  EVAL:  For 8 months I walked 30 min, every day, now something has changed and I just can't do it , it feels like my legs are logs or full of water  PERTINENT HISTORY: Reports wakes up every morning with pain B lower legs, ant and lateral  lower legs, also same pain pattern with walking .  Referred by sports med MD  PAIN:  Are you having pain?  3/10 L distal leg  PRECAUTIONS: None  RED FLAGS: None   WEIGHT BEARING RESTRICTIONS: No  FALLS:  Has patient fallen in last 6 months? No  LIVING ENVIRONMENT: Lives with: lives alone Lives in: House/apartment Stairs: No Has following equipment at home: Single point cane  OCCUPATION: retired, does foster cats  PLOF: Independent  PATIENT GOALS: be able to walk 30 min to get my activity level also wake up without pain  NEXT MD VISIT: 09/26/23  OBJECTIVE:  Note: Objective measures were completed at Evaluation unless otherwise noted.  DIAGNOSTIC FINDINGS: x rays with arthritic changes L5/S1  PATIENT  SURVEYS:  PSFS: THE PATIENT SPECIFIC FUNCTIONAL SCALE  Place score of 0-10 (0 = unable to perform activity and 10 = able to perform activity at the same level as before injury or problem)  Activity Date: 09/25/23    Walk greater than 20 min 2    2.stand from sleeping/lying down position  0    3.ascend/ descend 4 steps  0         Total Score 2      Total Score = Sum of activity scores/number of activities  Minimally Detectable Change: 3 points (for single activity); 2 points (for average score)  [name] (nd). The Patient Specific Functional Scale . Retrieved from [email or web address].[email or web address]   COGNITION: Overall cognitive status: Within functional limits for tasks assessed     SENSATION: WFL  EDEMA:  None noted  POSTURE: loss of lumbar lordosis, Er B hips, post pelvic tilt in standing and with gait  PALPATION: Non tender over post hips, tender with PA glide L2/3 Non tender B lower legs, ant, post musculature  LOWER EXTREMITY ROM:wfl B LUMBAR ROM: flex wnl, ext 50%, sb R with pain central lumbar, L sb wnl  LOWER EXTREMITY MMT:  MMT Right eval Left eval  Hip flexion wfl wfl  Hip extension    Hip abduction wfl wfl  Hip adduction    Hip internal rotation    Hip external rotation    Knee flexion    Knee extension    Ankle dorsiflexion    Ankle plantarflexion toe walking 4 3-  Ankle inversion    Ankle eversion     (Blank rows = not tested)  LOWER EXTREMITY SPECIAL TESTS:  na  FUNCTIONAL TESTS:  30 sec sit to sit 13 reps    GAIT: Distance walked: 39' in clinic, no deficits noted, no device                                                                                                                               TREATMENT DATE:  11/14/23 Passive stretch LE's and lumbar spine STM with the Tgun to the low back, HS, ITB, glutes and the rhomboid Gentle PPT, trunk rotation   11/12/23  Nustep L 5  Standing on foam mat shld ext and rows 2 sets 10 Upright row with trunk ext 2 sets 10 6 inch step up opp leg ext 10 x BIL with UE support Lats 25# 2 sets 10 Black tband trunk ext 20X STS with wt ball OH 10 x Feet on ball bridge,obl,KTC. Iso abdominals 15 x Red tband SLR 10x BIL then SLR with abd 10 x ( had to take band off LLE d/t pain and weakness) PROM BIL LE and trunk  11/07/23 Walk outside 8 min working on increased paced, struggled with increased pace and on inclines. Fwd head, increased lumbar lordosis and kyphosis HS curl 35# 10x, 25# 10 x Knee ext 10# 2 sets 10 Seated row 25#  2 sets 10  Lats 25# 2 sets 10 Black tband trunk ext 20X Leg press 30# 10x then decrease 20# 10 x Black bar DF and PF 2 sets 10 Attempted but increased left knee pain and uncomfortable to did not continue-Red tband SL flex,ext and abd  PROM BIL LE and trunk    11/05/23 Nustep level 5 x 5 minutes Gait outside 2 laps no rest Seated row 20# Lats 20# Passive LE stretches STM to the HS  10/31/23 Gait around the building, fast pace no rest around the building Nutep level 5 x 6 minutes Back to wall red tband horizontal abduction 20# lats Slant board stretch 5# straight arm pulls STM to the upper traps Supine Feet on ball K2C, rotation, bridge, isometric abs Passive LE stretch, trunk strethces  10/29/23 Nustep L 6 -  can feel this more today in LB Seated row and lat pull down 25# 2 sets 10 Black tband trunk ext 2 sets 10 Shoulder ER green 2x10 - at wall Cervical retractions - at wall Shoulder horiz abd green 2x10 - at wall Ball vs wall push ups 5 x CC and CCW Seated isometric abdominals 2 sets 10 Supine feet on ball bridge,KTC and obl LE PROM and trunk    10/24/23 Feet on ball K2C, small rotations, posterior isometrics, isometric abs PPT PAssive stretches calves, HS, piriformis, adductors, quads  10/22/23 NuStep L 5 x 6 min  Shoulder ER green 2x10  Cervical retractions   Shoulder horiz abd green 2x10  Seated OHP blue ball 2x10 Sit to stand holding blue ball 2x12 Bridges x15  W/ March    Stopped after a few reps due to L HS cramping  10/17/23 Nustep L 5 Gait around the back building around widest part of parking lor Seated Rows & Lats 35lb 2x12 Shoulder Ext 10lb 2x10 Lumbar ROM  WFL  S2S OHP yellow ball 2x10 Bridges x10 Feet on ball bridge, KTC,SLR,obl and iso abdominals PROM LE and trunk w/ end range holds   10/15/23 Nustep L 5 Standing HS stretch 20 sec 2 x BIL Standing step calf stretch 20 sec 3 x Black bar heel raises and toe raises 20 x 25# lat pull down and seated row 2 set 10 Black tband trunk flex and ext 20 x Wt ball trunk ext 10 x, trunk rotation 10 x each way Standing red tband hip ext and abd 2 sets 10- c/o increased increased pain with SLS Feet on ball bridge, KTC,SLR,obl and iso abdominals PROM LE and trunk    PATIENT EDUCATION:  Education details: POC, goals Person educated: Patient Education method: Explanation, Demonstration, Tactile cues, Verbal cues, and Handouts Education comprehension: verbalized understanding  HOME EXERCISE PROGRAM: Access Code: [email or web address] URL:  [email or web address] Date: 09/25/2023 Prepared by: Amy Speaks  Exercises - Supine Lower Trunk Rotation  - 1 x daily - 7 x weekly - 3 sets - 10 reps - Long Sitting Ankle Plantar Flexion with Resistance  - 1 x daily - 7 x weekly - 3 sets - 10 reps Access Code: [email or web address] URL: [email or web address] Date: 09/27/2023 Prepared by: Ozell Mainland  Exercises - Hooklying Single Knee to Chest Stretch  - 2 x daily - 7 x weekly - 1 sets - 10 reps - 5 hold - Seated Table Hamstring Stretch  - 2 x daily - 7 x weekly - 1 sets - 5 reps - 30 hold - Seated Hamstring Stretch  - 2 x daily - 7 x weekly - 1 sets - 5 reps - 30 hold - Seated Hamstring Stretch with Chair  - 2 x daily - 7 x weekly - 1 sets - 5 reps - 30 hold - Supine  Piriformis Stretch Pulling Heel to Hip  - 2 x daily - 7 x weekly - 1 sets - 5 reps - 30 hold ASSESSMENT:  CLINICAL IMPRESSION  Started rabies shots and had Dr. Joane inject the knee this AM.  We elected to do mostly passive stretch and STM due to the injection in the knee today.  Due to his poor posture he has a very hard time getting comfortable , could not lie on the side had to do some in sitting and then in supine.   OBJECTIVE IMPAIRMENTS: decreased balance, decreased endurance, decreased mobility, difficulty walking, decreased strength, hypomobility, and pain.   ACTIVITY LIMITATIONS: sleeping, stairs, transfers, bed mobility, and locomotion level  PARTICIPATION LIMITATIONS: laundry, shopping, and community activity  PERSONAL FACTORS: Age, Behavior pattern, Fitness, Time since onset of injury/illness/exacerbation, and 1 comorbidity: cervical radiculopathy are also affecting patient's functional outcome.   REHAB POTENTIAL: Good  CLINICAL DECISION MAKING: Evolving/moderate complexity  EVALUATION COMPLEXITY: Moderate   GOALS: Goals reviewed with patient? Yes  SHORT TERM GOALS: Target date: 2 weeks ,10/09/23 I HEP Baseline: Goal status:progressing 10/03/23   11/07/23 MET   LONG TERM GOALS: Target date: 8 weeks 11/21/23  Able to walk 30 min 3 to 5 x week without interference from B lower leg pain Baseline:  Goal status: Progressing 15-20 min a day 10/31/23  and 11/07/23  2.  Improve B plantarflexor strength L from 3+/5  and R form 4/5 to 5/5 for improved balance Baseline:  Goal status:progressing 11/14/23  3.  Improve lumbar side bending to R to wnl without provocation of Sx Baseline:  Goal status: 10/17/23 Met    PLAN:  PT FREQUENCY: 2x/week  PT DURATION: 8 weeks  PLANNED INTERVENTIONS: 97110-Therapeutic exercises, 97530- Therapeutic activity, 97112- Neuromuscular re-education, 97535- Self Care, 02859- Manual therapy, G0283- Electrical stimulation (unattended), 97035-  Ultrasound, 02987- Traction (mechanical), and Patient/Family education  PLAN FOR NEXT SESSION: Continue with posture, flexibility and strength, see how the knee is doing try to push walking here and at home  Analisa Sledd W, PT, 11/14/2023, 10:08 AM

## 2023-11-14 NOTE — Progress Notes (Signed)
   I, Todd Larson am a scribe for Dr. Artist Lloyd, MD.  Todd Larson is a 83 y.o. male who presents to [facility] today for cont'd lumbar radiculopathy s/p ESI on Oct 21st. Pt was last seen by Dr. Lloyd on 09/26/23 and a l-spine MRI was ordered. Based on findings, ESI was ordered.  Today, pt reports that his legs are where the pain is located. Initially after the epidural the pain got worse due to the odd positioning for the epidural. The pain now goes up to the hip when it starts in lower leg. Left knee is also giving him issues. He has changed his gait due to the not trying to trip over his cats. Been in physical therapy for about a month now.   Took in stray cat and it scratched/bite him. Going through Rabies shot treatments currently.   Dx testing: 10/10/23 L-spine MRI 09/12/23 Vasc US  09/05/23 R & L knee, L-spine XR, and labs  Pertinent review of systems: No fevers or chills  Relevant historical information: Hypertension   Exam:  BP 130/60   Pulse 95   Ht 5' 8 (1.727 m)   Wt 215 lb (97.5 kg)   SpO2 96%   BMI 32.69 kg/m  General: Well Developed, well nourished, and in no acute distress.   MSK: L-spine decreased lumbar motion extremity strength is intact.  Left knee mild effusion normal motion with crepitation.  Lab and Radiology Results  Procedure: Real-time Ultrasound Guided Injection of left knee joint superior lateral patella space Device: Philips Affiniti 50G/GE Logiq Images permanently stored and available for review in PACS Verbal informed consent obtained.  Discussed risks and benefits of procedure. Warned about infection, bleeding, hyperglycemia damage to structures among others. Patient expresses understanding and agreement Time-out conducted.   Noted no overlying erythema, induration, or other signs of local infection.   Skin prepped in a sterile fashion.   Local anesthesia: Topical Ethyl chloride.   With sterile technique and  under real time ultrasound guidance: 40 mg of Kenalog  and 2 mL of Marcaine  injected into knee joint. Fluid seen entering the joint capsule.   Completed without difficulty   Pain immediately resolved suggesting accurate placement of the medication.   Advised to call if fevers/chills, erythema, induration, drainage, or persistent bleeding.   Images permanently stored and available for review in the ultrasound unit.  Impression: Technically successful ultrasound guided injection.        Assessment and Plan: 83 y.o. male with lumbar radiculopathy improved following epidural steroid injection.  Symptoms are still present.  Plan for continue home exercise program watchful waiting.  Can repeat injection upon request as needed.  Left knee pain due to exacerbation of DJD.  Plan for steroid injection.  Check back as needed.   PDMP not reviewed this encounter. Orders Placed This Encounter  Procedures   US  LIMITED JOINT SPACE STRUCTURES LOW RIGHT(NO LINKED CHARGES)    Reason for Exam (SYMPTOM  OR DIAGNOSIS REQUIRED):   right knee pain    Preferred imaging location?:   [facility]   No orders of the defined types were placed in this encounter.    Discussed warning signs or symptoms. Please see discharge instructions. Patient expresses understanding.   The above documentation has been reviewed and is accurate and complete Artist Lloyd, M.D.

## 2023-11-16 ENCOUNTER — Ambulatory Visit
Admission: RE | Admit: 2023-11-16 | Discharge: 2023-11-16 | Disposition: A | Discharge status: Home / Self Care | Source: Ambulatory Visit | Attending: Internal Medicine | Admitting: Internal Medicine

## 2023-11-16 DIAGNOSIS — Z203 Contact with and (suspected) exposure to rabies: Secondary | ICD-10-CM | POA: Diagnosis not present

## 2023-11-16 DIAGNOSIS — Z23 Encounter for immunization: Secondary | ICD-10-CM | POA: Diagnosis not present

## 2023-11-16 MED ORDER — RABIES VIRUS VACCINE, HDC IM SUSR
1.0000 mL | Freq: Once | INTRAMUSCULAR | Status: AC
  Administered 2023-11-16: 1 mL via INTRAMUSCULAR

## 2023-11-16 NOTE — ED Triage Notes (Signed)
 Pt here for second rabies vaccine .

## 2023-11-19 ENCOUNTER — Ambulatory Visit: Admitting: Family Medicine

## 2023-11-20 ENCOUNTER — Ambulatory Visit
Admission: RE | Admit: 2023-11-20 | Discharge: 2023-11-20 | Disposition: A | Discharge status: Home / Self Care | Source: Ambulatory Visit | Attending: Nurse Practitioner | Admitting: Nurse Practitioner

## 2023-11-20 ENCOUNTER — Encounter: Payer: Self-pay | Admitting: Physical Therapy

## 2023-11-20 ENCOUNTER — Ambulatory Visit: Admitting: Physical Therapy

## 2023-11-20 DIAGNOSIS — M79661 Pain in right lower leg: Secondary | ICD-10-CM

## 2023-11-20 DIAGNOSIS — Z23 Encounter for immunization: Secondary | ICD-10-CM | POA: Diagnosis not present

## 2023-11-20 DIAGNOSIS — R262 Difficulty in walking, not elsewhere classified: Secondary | ICD-10-CM

## 2023-11-20 DIAGNOSIS — Z203 Contact with and (suspected) exposure to rabies: Secondary | ICD-10-CM

## 2023-11-20 DIAGNOSIS — M79662 Pain in left lower leg: Secondary | ICD-10-CM

## 2023-11-20 MED ORDER — RABIES VIRUS VACCINE, HDC IM SUSR
1.0000 mL | Freq: Once | INTRAMUSCULAR | Status: AC
  Administered 2023-11-20: 1 mL via INTRAMUSCULAR

## 2023-11-20 NOTE — ED Triage Notes (Signed)
Pt here for 3rd rabies vaccine

## 2023-11-20 NOTE — Therapy (Signed)
 OUTPATIENT PHYSICAL THERAPY LOWER EXTREMITY TREATMENT    Patient Name: Todd Larson MRN: [id number] DOB:[birth year], 83 y.o., male Today's Date: 11/20/2023  END OF SESSION:  PT End of Session - 11/20/23 0917     Visit Number 15    Date for Recertification  11/21/23    PT Start Time 0915    PT Stop Time 1000    PT Time Calculation (min) 45 min          Past Medical History:  Diagnosis Date   Arthritis    cervical spine - T7-8 & lumbar area - bone spurs up & down the back    Bulging discs     thoracic spine   Diverticulosis of colon    GERD (gastroesophageal reflux disease)    related to intestinal system, - has started Align  4-5 days ago-    Hyperlipidemia    Hypertension    Inguinal hernia    OSA (obstructive sleep apnea)    saw Dr. Neysa- 02/2015- pt. remarks that as a result BP med. was changed , pt. has a sleep number bed & he reports the score averages in the 80% range    PSA elevation    History of    Recurrent sinus infections    related to fracture nose x2, also reports that he has a deviated septum   Past Surgical History:  Procedure Laterality Date   COLONOSCOPY  2008   3 internal hemorrhoids, diverticulosis;Dr.Jacobs   INSERTION OF MESH N/A 11/13/2016   Procedure: INSERTION OF MESH;  Surgeon: Todd Calamity, MD;  Location: [facility] OR;  Service: General;  Laterality: N/A;   LAPAROSCOPIC INGUINAL HERNIA WITH UMBILICAL HERNIA Left 11/13/2016   Procedure: LAPAROSCOPIC LEFT INGUINAL HERNIA  AND UMBILICAL HERNIA REPAIRS WITH MESH;  Surgeon: Todd Calamity, MD;  Location: [facility] OR;  Service: General;  Laterality: Left;   PROSTATE BIOPSY     x 2, once abnormal, once normal   TONSILLECTOMY     Patient Active Problem List   Diagnosis Date Noted   Right knee meniscal tear 06/01/2015   Obstructive sleep apnea 02/07/2015   Thoracic spine pain 03/29/2014   Tenosynovitis of thumb 03/29/2014   Cervical radiculopathy due to degenerative joint disease of spine 10/10/2013    Bursitis of left shoulder 09/23/2013   Chronic low back pain 12/15/2012   Chronic cervical pain 06/09/2012   Hyperlipidemia 07/06/2010   Essential hypertension 11/03/2009   Inguinal hernia 11/03/2009   Nonallergic rhinitis 08/24/2009   Ventral hernia 05/20/2009   Diverticulosis of colon 05/20/2009   Benign neoplasm of skin 10/26/2008   GERD 10/01/2006    PCP: Todd Huxley NP  REFERRING PROVIDER: Joane Birmingham, MD  REFERRING DIAG: lumbar radiculitis  THERAPY DIAG:  Difficulty in walking, not elsewhere classified  Pain in left lower leg  Pain in right lower leg  Rationale for Evaluation and Treatment: Rehabilitation  ONSET DATE: 6 months march 2025  SUBJECTIVE:   SUBJECTIVE STATEMENT:  Epifural made the pain worst form having to lay prone, now pain radiating down entire leg. Still talking a rabies injection series   EVAL:  For 8 months I walked 30 min, every day, now something has changed and I just can't do it , it feels like my legs are logs or full of water  PERTINENT HISTORY: Reports wakes up every morning with pain B lower legs, ant and lateral lower legs, also same pain pattern with walking .  Referred by sports med MD  PAIN:  Are Todd having pain?  2/10 L distal leg  PRECAUTIONS: None  RED FLAGS: None   WEIGHT BEARING RESTRICTIONS: No  FALLS:  Has patient fallen in last 6 months? No  LIVING ENVIRONMENT: Lives with: lives alone Lives in: House/apartment Stairs: No Has following equipment at home: Single point cane  OCCUPATION: retired, does foster cats  PLOF: Independent  PATIENT GOALS: be able to walk 30 min to get my activity level also wake up without pain  NEXT MD VISIT: 09/26/23  OBJECTIVE:  Note: Objective measures were completed at Evaluation unless otherwise noted.  DIAGNOSTIC FINDINGS: x rays with arthritic changes L5/S1  PATIENT SURVEYS:  PSFS: THE PATIENT SPECIFIC FUNCTIONAL SCALE  Place score of 0-10 (0 = unable to perform  activity and 10 = able to perform activity at the same level as before injury or problem)  Activity Date: 09/25/23 11/20/23   Walk greater than 20 min 2 7   2.stand from sleeping/lying down position  0 8   3.ascend/ descend 4 steps  0 2        Total Score 2 5.3     Total Score = Sum of activity scores/number of activities  Minimally Detectable Change: 3 points (for single activity); 2 points (for average score)  [facility] (nd). The Patient Specific Functional Scale . Retrieved from [email or web address]   COGNITION: Overall cognitive status: Within functional limits for tasks assessed     SENSATION: WFL  EDEMA:  None noted  POSTURE: loss of lumbar lordosis, Er B hips, post pelvic tilt in standing and with gait  PALPATION: Non tender over post hips, tender with PA glide L2/3 Non tender B lower legs, ant, post musculature  LOWER EXTREMITY ROM:wfl B LUMBAR ROM: flex wnl, ext 50%, sb R with pain central lumbar, L sb wnl  LOWER EXTREMITY MMT:  MMT Right eval Left eval  Hip flexion wfl wfl  Hip extension    Hip abduction wfl wfl  Hip adduction    Hip internal rotation    Hip external rotation    Knee flexion    Knee extension    Ankle dorsiflexion    Ankle plantarflexion toe walking 4 3-  Ankle inversion    Ankle eversion     (Blank rows = not tested)  LOWER EXTREMITY SPECIAL TESTS:  na  FUNCTIONAL TESTS:  30 sec sit to sit 13 reps    GAIT: Distance walked: 72' in clinic, no deficits noted, no device                                                                                                                               TREATMENT DATE:  11/20/23 NuStep L 5 x6 min Shoulder Ext 10lb 2x10 Seated rows & Lats 35lb 2x10 HS curl 35# 2x10 Knee ext 10# 2 sets 10 Bridges x10 Passive stretch LE's and lumbar spine  11/14/23 Passive stretch LE's and lumbar spine STM with the Tgun  to the low  back, HS, ITB, glutes and the rhomboid Gentle PPT, trunk rotation   11/12/23 Nustep L 5 Standing on foam mat shld ext and rows 2 sets 10 Upright row with trunk ext 2 sets 10 6 inch step up opp leg ext 10 x BIL with UE support Lats 25# 2 sets 10 Black tband trunk ext 20X STS with wt ball OH 10 x Feet on ball bridge,obl,KTC. Iso abdominals 15 x Red tband SLR 10x BIL then SLR with abd 10 x ( had to take band off LLE d/t pain and weakness) PROM BIL LE and trunk  11/07/23 Walk outside 8 min working on increased paced, struggled with increased pace and on inclines. Fwd head, increased lumbar lordosis and kyphosis HS curl 35# 10x, 25# 10 x Knee ext 10# 2 sets 10 Seated row 25#  2 sets 10  Lats 25# 2 sets 10 Black tband trunk ext 20X Leg press 30# 10x then decrease 20# 10 x Black bar DF and PF 2 sets 10 Attempted but increased left knee pain and uncomfortable to did not continue-Red tband SL flex,ext and abd  PROM BIL LE and trunk    11/05/23 Nustep level 5 x 5 minutes Gait outside 2 laps no rest Seated row 20# Lats 20# Passive LE stretches STM to the HS  10/31/23 Gait around the building, fast pace no rest around the building Nutep level 5 x 6 minutes Back to wall red tband horizontal abduction 20# lats Slant board stretch 5# straight arm pulls STM to the upper traps Supine Feet on ball K2C, rotation, bridge, isometric abs Passive LE stretch, trunk strethces  10/29/23 Nustep L 6 -  can feel this more today in LB Seated row and lat pull down 25# 2 sets 10 Black tband trunk ext 2 sets 10 Shoulder ER green 2x10 - at wall Cervical retractions - at wall Shoulder horiz abd green 2x10 - at wall Ball vs wall push ups 5 x CC and CCW Seated isometric abdominals 2 sets 10 Supine feet on ball bridge,KTC and obl LE PROM and trunk    10/24/23 Feet on ball K2C, small rotations, posterior isometrics, isometric abs PPT PAssive stretches calves, HS, piriformis,  adductors, quads  10/22/23 NuStep L 5 x 6 min  Shoulder ER green 2x10  Cervical retractions  Shoulder horiz abd green 2x10  Seated OHP blue ball 2x10 Sit to stand holding blue ball 2x12 Bridges x15  W/ March    Stopped after a few reps due to L HS cramping  10/17/23 Nustep L 5 Gait around the back building around widest part of parking lor Seated Rows & Lats 35lb 2x12 Shoulder Ext 10lb 2x10 Lumbar ROM  WFL  S2S OHP yellow ball 2x10 Bridges x10 Feet on ball bridge, KTC,SLR,obl and iso abdominals PROM LE and trunk w/ end range holds   10/15/23 Nustep L 5 Standing HS stretch 20 sec 2 x BIL Standing step calf stretch 20 sec 3 x Black bar heel raises and toe raises 20 x 25# lat pull down and seated row 2 set 10 Black tband trunk flex and ext 20 x Wt ball trunk ext 10 x, trunk rotation 10 x each way Standing red tband hip ext and abd 2 sets 10- c/o increased increased pain with SLS Feet on ball bridge, KTC,SLR,obl and iso abdominals PROM LE and trunk    PATIENT EDUCATION:  Education details: POC, goals Person educated: Patient Education method: Explanation, Demonstration, Tactile  cues, Verbal cues, and Handouts Education comprehension: verbalized understanding  HOME EXERCISE PROGRAM: Access Code: [email or web address] URL: [email or web address] Date: 09/25/2023 Prepared by: Amy Speaks  Exercises - Supine Lower Trunk Rotation  - 1 x daily - 7 x weekly - 3 sets - 10 reps - Long Sitting Ankle Plantar Flexion with Resistance  - 1 x daily - 7 x weekly - 3 sets - 10 reps Access Code: [email or web address] URL: [email or web address] Date: 09/27/2023 Prepared by: Ozell Mainland  Exercises - Hooklying Single Knee to Chest Stretch  - 2 x daily - 7 x weekly - 1 sets - 10 reps - 5 hold - Seated Table Hamstring Stretch  - 2 x daily - 7 x weekly - 1 sets - 5 reps - 30 hold - Seated Hamstring Stretch  - 2 x daily - 7 x weekly - 1 sets - 5 reps - 30 hold -  Seated Hamstring Stretch with Chair  - 2 x daily - 7 x weekly - 1 sets - 5 reps - 30 hold - Supine Piriformis Stretch Pulling Heel to Hip  - 2 x daily - 7 x weekly - 1 sets - 5 reps - 30 hold ASSESSMENT:  CLINICAL IMPRESSION  pt is still taking rabies injections. Pt continues to report back and knee pain but is unsure if it it a side effect of rabies injections.  Postural cues needed with shoulder Ext and seated rows. Increase resistance tolerated wotj rows and lats. Bilateral HS tightness remains. .   OBJECTIVE IMPAIRMENTS: decreased balance, decreased endurance, decreased mobility, difficulty walking, decreased strength, hypomobility, and pain.   ACTIVITY LIMITATIONS: sleeping, stairs, transfers, bed mobility, and locomotion level  PARTICIPATION LIMITATIONS: laundry, shopping, and community activity  PERSONAL FACTORS: Age, Behavior pattern, Fitness, Time since onset of injury/illness/exacerbation, and 1 comorbidity: cervical radiculopathy are also affecting patient's functional outcome.   REHAB POTENTIAL: Good  CLINICAL DECISION MAKING: Evolving/moderate complexity  EVALUATION COMPLEXITY: Moderate   GOALS: Goals reviewed with patient? Yes  SHORT TERM GOALS: Target date: 2 weeks ,10/09/23 I HEP Baseline: Goal status:progressing 10/03/23   11/07/23 MET   LONG TERM GOALS: Target date: 8 weeks 11/21/23  Able to walk 30 min 3 to 5 x week without interference from B lower leg pain Baseline:  Goal status: Progressing 15-20 min a day 10/31/23  and 11/07/23  2.  Improve B plantarflexor strength L from 3+/5  and R form 4/5 to 5/5 for improved balance Baseline:  Goal status:progressing 11/14/23  3.  Improve lumbar side bending to R to wnl without provocation of Sx Baseline:  Goal status: 10/17/23 Met    PLAN:  PT FREQUENCY: 2x/week  PT DURATION: 8 weeks  PLANNED INTERVENTIONS: 97110-Therapeutic exercises, 97530- Therapeutic activity, 97112- Neuromuscular re-education, 97535-  Self Care, [id number]- Manual therapy, [id number]- Electrical stimulation (unattended), 97035- Ultrasound, [id number]- Traction (mechanical), and Patient/Family education  PLAN FOR NEXT SESSION: Continue with posture, flexibility and strength, see how the knee is doing try to push walking here and at home, re cert   Tanda KANDICE Sorrow, PTA, 11/20/2023, 9:17 AM

## 2023-11-22 ENCOUNTER — Ambulatory Visit: Admitting: Physical Therapy

## 2023-11-22 DIAGNOSIS — R262 Difficulty in walking, not elsewhere classified: Secondary | ICD-10-CM | POA: Diagnosis not present

## 2023-11-22 DIAGNOSIS — M79661 Pain in right lower leg: Secondary | ICD-10-CM

## 2023-11-22 DIAGNOSIS — M79662 Pain in left lower leg: Secondary | ICD-10-CM

## 2023-11-22 NOTE — Addendum Note (Signed)
 Addended by: JANIT FORMICA on: 11/22/2023 10:41 AM   Modules accepted: Orders

## 2023-11-22 NOTE — Therapy (Addendum)
 OUTPATIENT PHYSICAL THERAPY LOWER EXTREMITY TREATMENT    Patient Name: Todd Larson MRN: [id number] DOB:July 05, 1940, 83 y.o., male Today's Date: 11/22/2023  END OF SESSION:  PT End of Session - 11/22/23 1011     Visit Number 16    Date for Recertification  12/19/23    Authorization Type Medicare    PT Start Time 1005    PT Stop Time 1050    PT Time Calculation (min) 45 min          Past Medical History:  Diagnosis Date   Arthritis    cervical spine - T7-8 & lumbar area - bone spurs up & down the back    Bulging discs     thoracic spine   Diverticulosis of colon    GERD (gastroesophageal reflux disease)    related to intestinal system, - has started Align  4-5 days ago-    Hyperlipidemia    Hypertension    Inguinal hernia    OSA (obstructive sleep apnea)    saw Dr. Neysa- 02/2015- pt. remarks that as a result BP med. was changed , pt. has a sleep number bed & he reports the score averages in the 80% range    PSA elevation    History of    Recurrent sinus infections    related to fracture nose x2, also reports that he has a deviated septum   Past Surgical History:  Procedure Laterality Date   COLONOSCOPY  2008   3 internal hemorrhoids, diverticulosis;Dr.Jacobs   INSERTION OF MESH N/A 11/13/2016   Procedure: INSERTION OF MESH;  Surgeon: Rubin Calamity, MD;  Location: [facility] OR;  Service: General;  Laterality: N/A;   LAPAROSCOPIC INGUINAL HERNIA WITH UMBILICAL HERNIA Left 11/13/2016   Procedure: LAPAROSCOPIC LEFT INGUINAL HERNIA  AND UMBILICAL HERNIA REPAIRS WITH MESH;  Surgeon: Rubin Calamity, MD;  Location: [facility] OR;  Service: General;  Laterality: Left;   PROSTATE BIOPSY     x 2, once abnormal, once normal   TONSILLECTOMY     Patient Active Problem List   Diagnosis Date Noted   Right knee meniscal tear 06/01/2015   Obstructive sleep apnea 02/07/2015   Thoracic spine pain 03/29/2014   Tenosynovitis of thumb 03/29/2014   Cervical radiculopathy due to degenerative  joint disease of spine 10/10/2013   Bursitis of left shoulder 09/23/2013   Chronic low back pain 12/15/2012   Chronic cervical pain 06/09/2012   Hyperlipidemia 07/06/2010   Essential hypertension 11/03/2009   Inguinal hernia 11/03/2009   Nonallergic rhinitis 08/24/2009   Ventral hernia 05/20/2009   Diverticulosis of colon 05/20/2009   Benign neoplasm of skin 10/26/2008   GERD 10/01/2006    PCP: Merna Huxley NP  REFERRING PROVIDER: Joane Birmingham, MD  REFERRING DIAG: lumbar radiculitis  THERAPY DIAG:  Difficulty in walking, not elsewhere classified  Pain in left lower leg  Pain in right lower leg  Rationale for Evaluation and Treatment: Rehabilitation  ONSET DATE: 6 months march 2025  SUBJECTIVE:   SUBJECTIVE STATEMENT:  Neck pain because I am trying to keep hea dup but very limited ext ROM Back pain radiating down legs since epidural.Left knee inj last week.Multi twisting on knee with cat getting under foot, Last rabies injection. I feel like therapy may be helping but a lot of set back since [facility]. EVAL:  For 8 months I walked 30 min, every day, now something has changed and I just can't do it , it feels like my legs are logs or full of  water  PERTINENT HISTORY: Reports wakes up every morning with pain B lower legs, ant and lateral lower legs, also same pain pattern with walking .  Referred by sports med MD  PAIN:  Are you having pain?  Left knee 2/10. Radiating pain BIL LE 5/10 esp at night. Neck 1/10 but does increase  PRECAUTIONS: None  RED FLAGS: None   WEIGHT BEARING RESTRICTIONS: No  FALLS:  Has patient fallen in last 6 months? No  LIVING ENVIRONMENT: Lives with: lives alone Lives in: House/apartment Stairs: No Has following equipment at home: Single point cane  OCCUPATION: retired, does foster cats  PLOF: Independent  PATIENT GOALS: be able to walk 30 min to get my activity level also wake up without pain  NEXT MD VISIT: 09/26/23  OBJECTIVE:   Note: Objective measures were completed at Evaluation unless otherwise noted.  DIAGNOSTIC FINDINGS: x rays with arthritic changes L5/S1  PATIENT SURVEYS:  PSFS: THE PATIENT SPECIFIC FUNCTIONAL SCALE  Place score of 0-10 (0 = unable to perform activity and 10 = able to perform activity at the same level as before injury or problem)  Activity Date: 09/25/23 11/20/23   Walk greater than 20 min 2 7   2.stand from sleeping/lying down position  0 8   3.ascend/ descend 4 steps  0 2        Total Score 2 5.3     Total Score = Sum of activity scores/number of activities  Minimally Detectable Change: 3 points (for single activity); 2 points (for average score)  [name] (nd). The Patient Specific Functional Scale . Retrieved from [email or web address]   COGNITION: Overall cognitive status: Within functional limits for tasks assessed     SENSATION: WFL  EDEMA:  None noted  POSTURE: loss of lumbar lordosis, Er B hips, post pelvic tilt in standing and with gait  PALPATION: Non tender over post hips, tender with PA glide L2/3 Non tender B lower legs, ant, post musculature  LOWER EXTREMITY ROM:wfl B LUMBAR ROM: flex wnl, ext 50%, sb R with pain central lumbar, L sb wnl  LOWER EXTREMITY MMT:  MMT Right eval Left eval  Hip flexion wfl wfl  Hip extension    Hip abduction wfl wfl  Hip adduction    Hip internal rotation    Hip external rotation    Knee flexion    Knee extension    Ankle dorsiflexion    Ankle plantarflexion toe walking 4 3-  Ankle inversion    Ankle eversion     (Blank rows = not tested)  LOWER EXTREMITY SPECIAL TESTS:  na  FUNCTIONAL TESTS:  30 sec sit to sit 13 reps    GAIT: Distance walked: 84' in clinic, no deficits noted, no device                                                                                                                               TREATMENT DATE:  11/22/23 Assessed goals for renewal Educ pnt on posture and hoe fwd head strains down back and he VU , educ on cerv strengthening is good to help muscles support vs spine. 20# upright row into back ext 2 sets 10 Wt ball OH ext 2 sets 10 On airex cable pulleys 10# 2 sets 10 each shld ext and row 10 x wall press into ext then  10 x Inclined press up into ext as prone is difficult- asked to do at home esp when increased pain to see if changes radiating symptoms 6 inch step up with UE support with opp leg ext then laterally with opp leg ext Active HS and calf stretching on step STS with wt ball 10 x with chest press, 10 x with OH press     11/20/23 NuStep L 5 x6 min Shoulder Ext 10lb 2x10 Seated rows & Lats 35lb 2x10 HS curl 35# 2x10 Knee ext 10# 2 sets 10 Bridges x10 Passive stretch LE's and lumbar spine  11/14/23 Passive stretch LE's and lumbar spine STM with the Tgun to the low back, HS, ITB, glutes and the rhomboid Gentle PPT, trunk rotation   11/12/23 Nustep L 5 Standing on foam mat shld ext and rows 2 sets 10 Upright row with trunk ext 2 sets 10 6 inch step up opp leg ext 10 x BIL with UE support Lats 25# 2 sets 10 Black tband trunk ext 20X STS with wt ball OH 10 x Feet on ball bridge,obl,KTC. Iso abdominals 15 x Red tband SLR 10x BIL then SLR with abd 10 x ( had to take band off LLE d/t pain and weakness) PROM BIL LE and trunk  11/07/23 Walk outside 8 min working on increased paced, struggled with increased pace and on inclines. Fwd head, increased lumbar lordosis and kyphosis HS curl 35# 10x, 25# 10 x Knee ext 10# 2 sets 10 Seated row 25#  2 sets 10  Lats 25# 2 sets 10 Black tband trunk ext 20X Leg press 30# 10x then decrease 20# 10 x Black bar DF and PF 2 sets 10 Attempted but increased left knee pain and uncomfortable to did not continue-Red tband SL flex,ext and abd  PROM BIL LE and trunk    11/05/23 Nustep level 5 x 5 minutes Gait outside 2 laps  no rest Seated row 20# Lats 20# Passive LE stretches STM to the HS  10/31/23 Gait around the building, fast pace no rest around the building Nutep level 5 x 6 minutes Back to wall red tband horizontal abduction 20# lats Slant board stretch 5# straight arm pulls STM to the upper traps Supine Feet on ball K2C, rotation, bridge, isometric abs Passive LE stretch, trunk strethces  10/29/23 Nustep L 6 -  can feel this more today in LB Seated row and lat pull down 25# 2 sets 10 Black tband trunk ext 2 sets 10 Shoulder ER green 2x10 - at wall Cervical retractions - at wall Shoulder horiz abd green 2x10 - at wall Ball vs wall push ups 5 x CC and CCW Seated isometric abdominals 2 sets 10 Supine feet on ball bridge,KTC and obl LE PROM and trunk    10/24/23 Feet on ball K2C, small rotations, posterior isometrics, isometric abs PPT PAssive stretches calves, HS, piriformis, adductors, quads  10/22/23 NuStep L 5 x 6 min  Shoulder ER green 2x10  Cervical retractions  Shoulder horiz abd green 2x10  Seated OHP blue ball 2x10 Sit to stand holding blue ball  2x12 Bridges x15  W/ March    Stopped after a few reps due to L HS cramping  10/17/23 Nustep L 5 Gait around the back building around widest part of parking lor Seated Rows & Lats 35lb 2x12 Shoulder Ext 10lb 2x10 Lumbar ROM  WFL  S2S OHP yellow ball 2x10 Bridges x10 Feet on ball bridge, KTC,SLR,obl and iso abdominals PROM LE and trunk w/ end range holds   10/15/23 Nustep L 5 Standing HS stretch 20 sec 2 x BIL Standing step calf stretch 20 sec 3 x Black bar heel raises and toe raises 20 x 25# lat pull down and seated row 2 set 10 Black tband trunk flex and ext 20 x Wt ball trunk ext 10 x, trunk rotation 10 x each way Standing red tband hip ext and abd 2 sets 10- c/o increased increased pain with SLS Feet on ball bridge, KTC,SLR,obl and iso abdominals PROM LE and trunk    PATIENT EDUCATION:   Education details: POC, goals Person educated: Patient Education method: Explanation, Demonstration, Tactile cues, Verbal cues, and Handouts Education comprehension: verbalized understanding  HOME EXERCISE PROGRAM: Access Code: [email or web address] URL: [email or web address] Date: 09/25/2023 Prepared by: Amy Speaks  Exercises - Supine Lower Trunk Rotation  - 1 x daily - 7 x weekly - 3 sets - 10 reps - Long Sitting Ankle Plantar Flexion with Resistance  - 1 x daily - 7 x weekly - 3 sets - 10 reps Access Code: [email or web address] URL: [email or web address] Date: 09/27/2023 Prepared by: Ozell Mainland  Exercises - Hooklying Single Knee to Chest Stretch  - 2 x daily - 7 x weekly - 1 sets - 10 reps - 5 hold - Seated Table Hamstring Stretch  - 2 x daily - 7 x weekly - 1 sets - 5 reps - 30 hold - Seated Hamstring Stretch  - 2 x daily - 7 x weekly - 1 sets - 5 reps - 30 hold - Seated Hamstring Stretch with Chair  - 2 x daily - 7 x weekly - 1 sets - 5 reps - 30 hold - Supine Piriformis Stretch Pulling Heel to Hip  - 2 x daily - 7 x weekly - 1 sets - 5 reps - 30 hold ASSESSMENT:  CLINICAL IMPRESSION  Neck pain because I am trying to keep head up but very limited ext ROM.Back pain radiating down legs since epidural.Left knee inj last week.  Last rabies injection. I feel like therapy may be helping but a lot of set back since [facility]. Assessed and doc goals for renewal, slight set back with 1 goal that wa smet. Pt to f/u in 3 weeks re: knee injection. Pnt continues to be very tight through spine and HS- continue to encourage stretching at home. Added in more ext activities today to try and address radiating symptoms. Asked to do ext at home ot see if changes radiating pain .   OBJECTIVE IMPAIRMENTS: decreased balance, decreased endurance, decreased mobility, difficulty walking, decreased strength, hypomobility, and pain.   ACTIVITY LIMITATIONS: sleeping, stairs, transfers, bed mobility,  and locomotion level  PARTICIPATION LIMITATIONS: laundry, shopping, and community activity  PERSONAL FACTORS: Age, Behavior pattern, Fitness, Time since onset of injury/illness/exacerbation, and 1 comorbidity: cervical radiculopathy are also affecting patient's functional outcome.   REHAB POTENTIAL: Good  CLINICAL DECISION MAKING: Evolving/moderate complexity  EVALUATION COMPLEXITY: Moderate   GOALS: Goals reviewed with patient? Yes  SHORT TERM GOALS: Target date: 2 weeks ,10/09/23 I HEP Baseline: Goal status:progressing 10/03/23  11/07/23 MET   LONG TERM GOALS: Target date: 12/19/23  Able to walk 30 min 3 to 5 x week without interference from B lower leg pain Baseline:  Goal status: Progressing 15-20 min a day 10/31/23  and 11/07/23 11/22/23 on going about 5-15 min  2.  Improve B plantarflexor strength L from 3+/5  and R form 4/5 to 5/5 for improved balance Baseline:  Goal status:progressing 11/14/23  11/22/23 MET  3.  Improve lumbar side bending to R to wnl without provocation of Sx Baseline:  Goal status: 10/17/23 Met 11/22/23 set back so ongoing   PLAN:  PT FREQUENCY: 2x/week  PT DURATION: 8 weeks  PLANNED INTERVENTIONS: 97110-Therapeutic exercises, 97530- Therapeutic activity, 97112- Neuromuscular re-education, 97535- Self Care, [id number]- Manual therapy, [id number]- Electrical stimulation (unattended), 97035- Ultrasound, [id number]- Traction (mechanical), and Patient/Family education  PLAN FOR NEXT SESSION: Continue with posture, flexibility and strength, see how the knee is doing try to push walking here and at home. See if adding extension exercises helped.Renewal done  Clio Gerhart,ANGIE, PTA, 11/22/2023, 10:12 AM

## 2023-11-26 ENCOUNTER — Encounter: Payer: Self-pay | Admitting: Physical Therapy

## 2023-11-26 ENCOUNTER — Ambulatory Visit: Admitting: Physical Therapy

## 2023-11-26 DIAGNOSIS — M79662 Pain in left lower leg: Secondary | ICD-10-CM

## 2023-11-26 DIAGNOSIS — R262 Difficulty in walking, not elsewhere classified: Secondary | ICD-10-CM

## 2023-11-26 DIAGNOSIS — M79661 Pain in right lower leg: Secondary | ICD-10-CM | POA: Diagnosis not present

## 2023-11-26 NOTE — Therapy (Signed)
 OUTPATIENT PHYSICAL THERAPY LOWER EXTREMITY TREATMENT    Patient Name: Todd Larson MRN: [id number] DOB:11/07/1940, 83 y.o., male Today's Date: 11/26/2023  END OF SESSION:  PT End of Session - 11/26/23 1502     Visit Number 17    Date for Recertification  12/19/23    PT Start Time 1515    PT Stop Time 1600    PT Time Calculation (min) 45 min    Activity Tolerance Patient tolerated treatment well    Behavior During Therapy WFL for tasks assessed/performed          Past Medical History:  Diagnosis Date   Arthritis    cervical spine - T7-8 & lumbar area - bone spurs up & down the back    Bulging discs     thoracic spine   Diverticulosis of colon    GERD (gastroesophageal reflux disease)    related to intestinal system, - has started Align  4-5 days ago-    Hyperlipidemia    Hypertension    Inguinal hernia    OSA (obstructive sleep apnea)    saw Dr. Neysa- 02/2015- pt. remarks that as a result BP med. was changed , pt. has a sleep number bed & he reports the score averages in the 80% range    PSA elevation    History of    Recurrent sinus infections    related to fracture nose x2, also reports that he has a deviated septum   Past Surgical History:  Procedure Laterality Date   COLONOSCOPY  2008   3 internal hemorrhoids, diverticulosis;Dr.Jacobs   INSERTION OF MESH N/A 11/13/2016   Procedure: INSERTION OF MESH;  Surgeon: Rubin Calamity, MD;  Location: [facility] OR;  Service: General;  Laterality: N/A;   LAPAROSCOPIC INGUINAL HERNIA WITH UMBILICAL HERNIA Left 11/13/2016   Procedure: LAPAROSCOPIC LEFT INGUINAL HERNIA  AND UMBILICAL HERNIA REPAIRS WITH MESH;  Surgeon: Rubin Calamity, MD;  Location: [facility] OR;  Service: General;  Laterality: Left;   PROSTATE BIOPSY     x 2, once abnormal, once normal   TONSILLECTOMY     Patient Active Problem List   Diagnosis Date Noted   Right knee meniscal tear 06/01/2015   Obstructive sleep apnea 02/07/2015   Thoracic spine pain 03/29/2014    Tenosynovitis of thumb 03/29/2014   Cervical radiculopathy due to degenerative joint disease of spine 10/10/2013   Bursitis of left shoulder 09/23/2013   Chronic low back pain 12/15/2012   Chronic cervical pain 06/09/2012   Hyperlipidemia 07/06/2010   Essential hypertension 11/03/2009   Inguinal hernia 11/03/2009   Nonallergic rhinitis 08/24/2009   Ventral hernia 05/20/2009   Diverticulosis of colon 05/20/2009   Benign neoplasm of skin 10/26/2008   GERD 10/01/2006    PCP: Merna Huxley NP  REFERRING PROVIDER: Joane Birmingham, MD  REFERRING DIAG: lumbar radiculitis  THERAPY DIAG:  Difficulty in walking, not elsewhere classified  Pain in left lower leg  Pain in right lower leg  Rationale for Evaluation and Treatment: Rehabilitation  ONSET DATE: 6 months march 2025  SUBJECTIVE:   SUBJECTIVE STATEMENT:  Overall doing well, don't seem to be feeling any affects from rabies shot. General aches and pains. EVAL:  For 8 months I walked 30 min, every day, now something has changed and I just can't do it , it feels like my legs are logs or full of water  PERTINENT HISTORY: Reports wakes up every morning with pain B lower legs, ant and lateral lower legs, also same pain  pattern with walking .  Referred by sports med MD  PAIN:  Are you having pain?  Left knee 2/10. Radiating pain BIL LE 5/10 esp at night. Neck 1/10 but does increase  PRECAUTIONS: None  RED FLAGS: None   WEIGHT BEARING RESTRICTIONS: No  FALLS:  Has patient fallen in last 6 months? No  LIVING ENVIRONMENT: Lives with: lives alone Lives in: House/apartment Stairs: No Has following equipment at home: Single point cane  OCCUPATION: retired, does foster cats  PLOF: Independent  PATIENT GOALS: be able to walk 30 min to get my activity level also wake up without pain  NEXT MD VISIT: 09/26/23  OBJECTIVE:  Note: Objective measures were completed at Evaluation unless otherwise noted.  DIAGNOSTIC FINDINGS: x  rays with arthritic changes L5/S1  PATIENT SURVEYS:  PSFS: THE PATIENT SPECIFIC FUNCTIONAL SCALE  Place score of 0-10 (0 = unable to perform activity and 10 = able to perform activity at the same level as before injury or problem)  Activity Date: 09/25/23 11/20/23   Walk greater than 20 min 2 7   2.stand from sleeping/lying down position  0 8   3.ascend/ descend 4 steps  0 2        Total Score 2 5.3     Total Score = Sum of activity scores/number of activities  Minimally Detectable Change: 3 points (for single activity); 2 points (for average score)  [name] (nd). The Patient Specific Functional Scale . Retrieved from [email or web address]   COGNITION: Overall cognitive status: Within functional limits for tasks assessed     SENSATION: WFL  EDEMA:  None noted  POSTURE: loss of lumbar lordosis, Er B hips, post pelvic tilt in standing and with gait  PALPATION: Non tender over post hips, tender with PA glide L2/3 Non tender B lower legs, ant, post musculature  LOWER EXTREMITY ROM:wfl B LUMBAR ROM: flex wnl, ext 50%, sb R with pain central lumbar, L sb wnl  LOWER EXTREMITY MMT:  MMT Right eval Left eval  Hip flexion wfl wfl  Hip extension    Hip abduction wfl wfl  Hip adduction    Hip internal rotation    Hip external rotation    Knee flexion    Knee extension    Ankle dorsiflexion    Ankle plantarflexion toe walking 4 3-  Ankle inversion    Ankle eversion     (Blank rows = not tested)  LOWER EXTREMITY SPECIAL TESTS:  na  FUNCTIONAL TESTS:  30 sec sit to sit 13 reps    GAIT: Distance walked: 62' in clinic, no deficits noted, no device                                                                                                                               TREATMENT DATE:  11/26/23 NuStep L5 x 6 min Shoulder Ext 10lb 2x10 HS curl 45lb 2x10 Leg Ext 10lb 2x10 STS with wt  ball 10 x  with chest press, 10 x with OH press Overhead lumbar Ext yellow ball 2x10 Horiz abd green 2x10 Seated rows & Lats 35lb 2x10   11/22/23 Assessed goals for renewal Educ pnt on posture and hoe fwd head strains down back and he VU , educ on cerv strengthening is good to help muscles support vs spine. 20# upright row into back ext 2 sets 10 Wt ball OH ext 2 sets 10 On airex cable pulleys 10# 2 sets 10 each shld ext and row 10 x wall press into ext then  10 x Inclined press up into ext as prone is difficult- asked to do at home esp when increased pain to see if changes radiating symptoms 6 inch step up with UE support with opp leg ext then laterally with opp leg ext Active HS and calf stretching on step STS with wt ball 10 x with chest press, 10 x with OH press     11/20/23 NuStep L 5 x6 min Shoulder Ext 10lb 2x10 Seated rows & Lats 35lb 2x10 HS curl 35# 2x10 Knee ext 10# 2 sets 10 Bridges x10 Passive stretch LE's and lumbar spine  11/14/23 Passive stretch LE's and lumbar spine STM with the Tgun to the low back, HS, ITB, glutes and the rhomboid Gentle PPT, trunk rotation   11/12/23 Nustep L 5 Standing on foam mat shld ext and rows 2 sets 10 Upright row with trunk ext 2 sets 10 6 inch step up opp leg ext 10 x BIL with UE support Lats 25# 2 sets 10 Black tband trunk ext 20X STS with wt ball OH 10 x Feet on ball bridge,obl,KTC. Iso abdominals 15 x Red tband SLR 10x BIL then SLR with abd 10 x ( had to take band off LLE d/t pain and weakness) PROM BIL LE and trunk  11/07/23 Walk outside 8 min working on increased paced, struggled with increased pace and on inclines. Fwd head, increased lumbar lordosis and kyphosis HS curl 35# 10x, 25# 10 x Knee ext 10# 2 sets 10 Seated row 25#  2 sets 10  Lats 25# 2 sets 10 Black tband trunk ext 20X Leg press 30# 10x then decrease 20# 10 x Black bar DF and PF 2 sets 10 Attempted but increased left knee pain and uncomfortable to did  not continue-Red tband SL flex,ext and abd  PROM BIL LE and trunk    11/05/23 Nustep level 5 x 5 minutes Gait outside 2 laps no rest Seated row 20# Lats 20# Passive LE stretches STM to the HS  10/31/23 Gait around the building, fast pace no rest around the building Nutep level 5 x 6 minutes Back to wall red tband horizontal abduction 20# lats Slant board stretch 5# straight arm pulls STM to the upper traps Supine Feet on ball K2C, rotation, bridge, isometric abs Passive LE stretch, trunk strethces  10/29/23 Nustep L 6 -  can feel this more today in LB Seated row and lat pull down 25# 2 sets 10 Black tband trunk ext 2 sets 10 Shoulder ER green 2x10 - at wall Cervical retractions - at wall Shoulder horiz abd green 2x10 - at wall Ball vs wall push ups 5 x CC and CCW Seated isometric abdominals 2 sets 10 Supine feet on ball bridge,KTC and obl LE PROM and trunk    10/24/23 Feet on ball K2C, small rotations, posterior isometrics, isometric abs PPT PAssive stretches calves, HS, piriformis, adductors, quads  10/22/23 NuStep L  5 x 6 min  Shoulder ER green 2x10  Cervical retractions  Shoulder horiz abd green 2x10  Seated OHP blue ball 2x10 Sit to stand holding blue ball 2x12 Bridges x15  W/ March    Stopped after a few reps due to L HS cramping  10/17/23 Nustep L 5 Gait around the back building around widest part of parking lor Seated Rows & Lats 35lb 2x12 Shoulder Ext 10lb 2x10 Lumbar ROM  WFL  S2S OHP yellow ball 2x10 Bridges x10 Feet on ball bridge, KTC,SLR,obl and iso abdominals PROM LE and trunk w/ end range holds   10/15/23 Nustep L 5 Standing HS stretch 20 sec 2 x BIL Standing step calf stretch 20 sec 3 x Black bar heel raises and toe raises 20 x 25# lat pull down and seated row 2 set 10 Black tband trunk flex and ext 20 x Wt ball trunk ext 10 x, trunk rotation 10 x each way Standing red tband hip ext and abd 2 sets 10- c/o  increased increased pain with SLS Feet on ball bridge, KTC,SLR,obl and iso abdominals PROM LE and trunk    PATIENT EDUCATION:  Education details: POC, goals Person educated: Patient Education method: Explanation, Demonstration, Tactile cues, Verbal cues, and Handouts Education comprehension: verbalized understanding  HOME EXERCISE PROGRAM: Access Code: [email or web address] URL: [email or web address] Date: 09/25/2023 Prepared by: Amy Speaks  Exercises - Supine Lower Trunk Rotation  - 1 x daily - 7 x weekly - 3 sets - 10 reps - Long Sitting Ankle Plantar Flexion with Resistance  - 1 x daily - 7 x weekly - 3 sets - 10 reps Access Code: [email or web address] URL: [email or web address] Date: 09/27/2023 Prepared by: Ozell Mainland  Exercises - Hooklying Single Knee to Chest Stretch  - 2 x daily - 7 x weekly - 1 sets - 10 reps - 5 hold - Seated Table Hamstring Stretch  - 2 x daily - 7 x weekly - 1 sets - 5 reps - 30 hold - Seated Hamstring Stretch  - 2 x daily - 7 x weekly - 1 sets - 5 reps - 30 hold - Seated Hamstring Stretch with Chair  - 2 x daily - 7 x weekly - 1 sets - 5 reps - 30 hold - Supine Piriformis Stretch Pulling Heel to Hip  - 2 x daily - 7 x weekly - 1 sets - 5 reps - 30 hold ASSESSMENT:  CLINICAL IMPRESSION   Pt enters reporting improvement overall. He things this due to it being the end of his rabies injections. Pt continues to be very tight through spine and HS- continue to encourage stretching at home. Continued with an extension base session without issue. PT did repots he could feel the lat pull downs in hs shoulder despite doing the same resistance previously. .   OBJECTIVE IMPAIRMENTS: decreased balance, decreased endurance, decreased mobility, difficulty walking, decreased strength, hypomobility, and pain.   ACTIVITY LIMITATIONS: sleeping, stairs, transfers, bed mobility, and locomotion level  PARTICIPATION LIMITATIONS: laundry, shopping, and community  activity  PERSONAL FACTORS: Age, Behavior pattern, Fitness, Time since onset of injury/illness/exacerbation, and 1 comorbidity: cervical radiculopathy are also affecting patient's functional outcome.   REHAB POTENTIAL: Good  CLINICAL DECISION MAKING: Evolving/moderate complexity  EVALUATION COMPLEXITY: Moderate   GOALS: Goals reviewed with patient? Yes  SHORT TERM GOALS: Target date: 2 weeks ,10/09/23 I HEP Baseline: Goal status:progressing 10/03/23   11/07/23 MET   LONG TERM GOALS: Target date: 12/19/23  Able to walk 30 min  3 to 5 x week without interference from B lower leg pain Baseline:  Goal status: Progressing 15-20 min a day 10/31/23  and 11/07/23 11/22/23 on going about 5-15 min  2.  Improve B plantarflexor strength L from 3+/5  and R form 4/5 to 5/5 for improved balance Baseline:  Goal status:progressing 11/14/23  11/22/23 MET  3.  Improve lumbar side bending to R to wnl without provocation of Sx Baseline:  Goal status: 10/17/23 Met 11/22/23 set back so ongoing   PLAN:  PT FREQUENCY: 2x/week  PT DURATION: 8 weeks  PLANNED INTERVENTIONS: 97110-Therapeutic exercises, 97530- Therapeutic activity, 97112- Neuromuscular re-education, 97535- Self Care, [id number]- Manual therapy, [id number]- Electrical stimulation (unattended), 97035- Ultrasound, [id number]- Traction (mechanical), and Patient/Family education  PLAN FOR NEXT SESSION: Continue with posture, flexibility and strength, see how the knee is doing try to push walking here and at home.   Tanda KANDICE Sorrow, PTA, 11/26/2023, 3:02 PM

## 2023-11-27 ENCOUNTER — Ambulatory Visit
Admission: RE | Admit: 2023-11-27 | Discharge: 2023-11-27 | Disposition: A | Discharge status: Home / Self Care | Source: Ambulatory Visit | Attending: Urgent Care | Admitting: Urgent Care

## 2023-11-27 DIAGNOSIS — Z23 Encounter for immunization: Secondary | ICD-10-CM | POA: Diagnosis not present

## 2023-11-27 DIAGNOSIS — Z203 Contact with and (suspected) exposure to rabies: Secondary | ICD-10-CM | POA: Diagnosis not present

## 2023-11-27 MED ORDER — RABIES VIRUS VACCINE, HDC IM SUSR
1.0000 mL | Freq: Once | INTRAMUSCULAR | Status: AC
  Administered 2023-11-27: 1 mL via INTRAMUSCULAR

## 2023-11-27 NOTE — ED Triage Notes (Signed)
 Pt presents for rabies vaccine  #4-denies side effects-NAD-steady gait

## 2023-12-09 ENCOUNTER — Encounter: Payer: Self-pay | Admitting: Family Medicine

## 2023-12-09 NOTE — Telephone Encounter (Signed)
 Forwarding to Dr. Denyse Amass to review and advise.

## 2023-12-10 ENCOUNTER — Ambulatory Visit: Attending: Family Medicine | Admitting: Physical Therapy

## 2023-12-10 ENCOUNTER — Encounter: Payer: Self-pay | Admitting: Physical Therapy

## 2023-12-10 DIAGNOSIS — M79662 Pain in left lower leg: Secondary | ICD-10-CM | POA: Insufficient documentation

## 2023-12-10 DIAGNOSIS — R262 Difficulty in walking, not elsewhere classified: Secondary | ICD-10-CM | POA: Insufficient documentation

## 2023-12-10 DIAGNOSIS — M79661 Pain in right lower leg: Secondary | ICD-10-CM | POA: Insufficient documentation

## 2023-12-10 NOTE — Therapy (Signed)
 OUTPATIENT PHYSICAL THERAPY LOWER EXTREMITY TREATMENT    Patient Name: Todd Larson MRN: [id number] DOB:[birth year], 83 y.o., male Today's Date: 12/10/2023  END OF SESSION:  PT End of Session - 12/10/23 1104     Visit Number 18    PT Start Time 1103    PT Stop Time 1145    PT Time Calculation (min) 42 min           Past Medical History:  Diagnosis Date   Arthritis    cervical spine - T7-8 & lumbar area - bone spurs up & down the back    Bulging discs     thoracic spine   Diverticulosis of colon    GERD (gastroesophageal reflux disease)    related to intestinal system, - has started Align  4-5 days ago-    Hyperlipidemia    Hypertension    Inguinal hernia    OSA (obstructive sleep apnea)    saw Dr. Neysa- 02/2015- pt. remarks that as a result BP med. was changed , pt. has a sleep number bed & he reports the score averages in the 80% range    PSA elevation    History of    Recurrent sinus infections    related to fracture nose x2, also reports that he has a deviated septum   Past Surgical History:  Procedure Laterality Date   COLONOSCOPY  2008   3 internal hemorrhoids, diverticulosis;Dr.Jacobs   INSERTION OF MESH N/A 11/13/2016   Procedure: INSERTION OF MESH;  Surgeon: Rubin Calamity, MD;  Location: [facility] OR;  Service: General;  Laterality: N/A;   LAPAROSCOPIC INGUINAL HERNIA WITH UMBILICAL HERNIA Left 11/13/2016   Procedure: LAPAROSCOPIC LEFT INGUINAL HERNIA  AND UMBILICAL HERNIA REPAIRS WITH MESH;  Surgeon: Rubin Calamity, MD;  Location: [facility] OR;  Service: General;  Laterality: Left;   PROSTATE BIOPSY     x 2, once abnormal, once normal   TONSILLECTOMY     Patient Active Problem List   Diagnosis Date Noted   Right knee meniscal tear 06/01/2015   Obstructive sleep apnea 02/07/2015   Thoracic spine pain 03/29/2014   Tenosynovitis of thumb 03/29/2014   Cervical radiculopathy due to degenerative joint disease of spine 10/10/2013   Bursitis of left shoulder 09/23/2013    Chronic low back pain 12/15/2012   Chronic cervical pain 06/09/2012   Hyperlipidemia 07/06/2010   Essential hypertension 11/03/2009   Inguinal hernia 11/03/2009   Nonallergic rhinitis 08/24/2009   Ventral hernia 05/20/2009   Diverticulosis of colon 05/20/2009   Benign neoplasm of skin 10/26/2008   GERD 10/01/2006    PCP: Merna Huxley NP  REFERRING PROVIDER: Joane Birmingham, MD  REFERRING DIAG: lumbar radiculitis  THERAPY DIAG:  No diagnosis found.  Rationale for Evaluation and Treatment: Rehabilitation  ONSET DATE: 6 months march 2025  SUBJECTIVE:   SUBJECTIVE STATEMENT: Pt states that he has been doing well since his previous session, reports compliance with HEP. States that he has been able to walk an average of 31+ mins. States that he has symptoms in BLE at L5/S1 dermatome distribution.  EVAL:  For 8 months I walked 30 min, every day, now something has changed and I just can't do it , it feels like my legs are logs or full of water  PERTINENT HISTORY: Reports wakes up every morning with pain B lower legs, ant and lateral lower legs, also same pain pattern with walking .  Referred by sports med MD  PAIN:  Are you having pain?  Left knee 2/10. Radiating pain BIL LE 5/10 esp at night. Neck 1/10 but does increase  PRECAUTIONS: None  RED FLAGS: None   WEIGHT BEARING RESTRICTIONS: No  FALLS:  Has patient fallen in last 6 months? No  LIVING ENVIRONMENT: Lives with: lives alone Lives in: House/apartment Stairs: No Has following equipment at home: Single point cane  OCCUPATION: retired, does foster cats  PLOF: Independent  PATIENT GOALS: be able to walk 30 min to get my activity level also wake up without pain  NEXT MD VISIT: 09/26/23  OBJECTIVE:  Note: Objective measures were completed at Evaluation unless otherwise noted.  DIAGNOSTIC FINDINGS: x rays with arthritic changes L5/S1  PATIENT SURVEYS:  PSFS: THE PATIENT SPECIFIC FUNCTIONAL SCALE  Place  score of 0-10 (0 = unable to perform activity and 10 = able to perform activity at the same level as before injury or problem)  Activity Date: 09/25/23 11/20/23   Walk greater than 20 min 2 7   2.stand from sleeping/lying down position  0 8   3.ascend/ descend 4 steps  0 2        Total Score 2 5.3     Total Score = Sum of activity scores/number of activities  Minimally Detectable Change: 3 points (for single activity); 2 points (for average score)  [name] (nd). The Patient Specific Functional Scale . Retrieved from [email or web address]   COGNITION: Overall cognitive status: Within functional limits for tasks assessed     SENSATION: WFL  EDEMA:  None noted  POSTURE: loss of lumbar lordosis, Er B hips, post pelvic tilt in standing and with gait  PALPATION: Non tender over post hips, tender with PA glide L2/3 Non tender B lower legs, ant, post musculature  LOWER EXTREMITY ROM:wfl B LUMBAR ROM: flex wnl, ext 50%, sb R with pain central lumbar, L sb wnl  LOWER EXTREMITY MMT:  MMT Right eval Left eval  Hip flexion wfl wfl  Hip extension    Hip abduction wfl wfl  Hip adduction    Hip internal rotation    Hip external rotation    Knee flexion    Knee extension    Ankle dorsiflexion    Ankle plantarflexion toe walking 4 3-  Ankle inversion    Ankle eversion     (Blank rows = not tested)  LOWER EXTREMITY SPECIAL TESTS:  na  FUNCTIONAL TESTS:  30 sec sit to sit 13 reps    GAIT: Distance walked: 34' in clinic, no deficits noted, no device                                                                                                                               TREATMENT DATE:  12/10/23 -PNF hold relax x10, 7 sec hold good effect, dec aching in BLE lower legs -Seated sciatic nerve glide in short sit 3x15 BLE alternating between sets -Fig 4 piriformis stretch in sitting x30 LLE -Slouch  overcorrect  x30 in sitting     11/26/23 NuStep L5 x 6 min Shoulder Ext 10lb 2x10 HS curl 45lb 2x10 Leg Ext 10lb 2x10 STS with wt ball 10 x with chest press, 10 x with OH press Overhead lumbar Ext yellow ball 2x10 Horiz abd green 2x10 Seated rows & Lats 35lb 2x10   11/22/23 Assessed goals for renewal Educ pnt on posture and hoe fwd head strains down back and he VU , educ on cerv strengthening is good to help muscles support vs spine. 20# upright row into back ext 2 sets 10 Wt ball OH ext 2 sets 10 On airex cable pulleys 10# 2 sets 10 each shld ext and row 10 x wall press into ext then  10 x Inclined press up into ext as prone is difficult- asked to do at home esp when increased pain to see if changes radiating symptoms 6 inch step up with UE support with opp leg ext then laterally with opp leg ext Active HS and calf stretching on step STS with wt ball 10 x with chest press, 10 x with OH press     11/20/23 NuStep L 5 x6 min Shoulder Ext 10lb 2x10 Seated rows & Lats 35lb 2x10 HS curl 35# 2x10 Knee ext 10# 2 sets 10 Bridges x10 Passive stretch LE's and lumbar spine  11/14/23 Passive stretch LE's and lumbar spine STM with the Tgun to the low back, HS, ITB, glutes and the rhomboid Gentle PPT, trunk rotation   11/12/23 Nustep L 5 Standing on foam mat shld ext and rows 2 sets 10 Upright row with trunk ext 2 sets 10 6 inch step up opp leg ext 10 x BIL with UE support Lats 25# 2 sets 10 Black tband trunk ext 20X STS with wt ball OH 10 x Feet on ball bridge,obl,KTC. Iso abdominals 15 x Red tband SLR 10x BIL then SLR with abd 10 x ( had to take band off LLE d/t pain and weakness) PROM BIL LE and trunk  11/07/23 Walk outside 8 min working on increased paced, struggled with increased pace and on inclines. Fwd head, increased lumbar lordosis and kyphosis HS curl 35# 10x, 25# 10 x Knee ext 10# 2 sets 10 Seated row 25#  2 sets 10  Lats 25# 2 sets 10 Black tband trunk  ext 20X Leg press 30# 10x then decrease 20# 10 x Black bar DF and PF 2 sets 10 Attempted but increased left knee pain and uncomfortable to did not continue-Red tband SL flex,ext and abd  PROM BIL LE and trunk    11/05/23 Nustep level 5 x 5 minutes Gait outside 2 laps no rest Seated row 20# Lats 20# Passive LE stretches STM to the HS  10/31/23 Gait around the building, fast pace no rest around the building Nutep level 5 x 6 minutes Back to wall red tband horizontal abduction 20# lats Slant board stretch 5# straight arm pulls STM to the upper traps Supine Feet on ball K2C, rotation, bridge, isometric abs Passive LE stretch, trunk strethces  10/29/23 Nustep L 6 -  can feel this more today in LB Seated row and lat pull down 25# 2 sets 10 Black tband trunk ext 2 sets 10 Shoulder ER green 2x10 - at wall Cervical retractions - at wall Shoulder horiz abd green 2x10 - at wall Ball vs wall push ups 5 x CC and CCW Seated isometric abdominals 2 sets 10 Supine feet on ball bridge,KTC and obl LE PROM and  trunk    10/24/23 Feet on ball K2C, small rotations, posterior isometrics, isometric abs PPT PAssive stretches calves, HS, piriformis, adductors, quads  10/22/23 NuStep L 5 x 6 min  Shoulder ER green 2x10  Cervical retractions  Shoulder horiz abd green 2x10  Seated OHP blue ball 2x10 Sit to stand holding blue ball 2x12 Bridges x15  W/ March    Stopped after a few reps due to L HS cramping  10/17/23 Nustep L 5 Gait around the back building around widest part of parking lor Seated Rows & Lats 35lb 2x12 Shoulder Ext 10lb 2x10 Lumbar ROM  WFL  S2S OHP yellow ball 2x10 Bridges x10 Feet on ball bridge, KTC,SLR,obl and iso abdominals PROM LE and trunk w/ end range holds   10/15/23 Nustep L 5 Standing HS stretch 20 sec 2 x BIL Standing step calf stretch 20 sec 3 x Black bar heel raises and toe raises 20 x 25# lat pull down and seated row 2 set  10 Black tband trunk flex and ext 20 x Wt ball trunk ext 10 x, trunk rotation 10 x each way Standing red tband hip ext and abd 2 sets 10- c/o increased increased pain with SLS Feet on ball bridge, KTC,SLR,obl and iso abdominals PROM LE and trunk    PATIENT EDUCATION:  Education details: POC, goals Person educated: Patient Education method: Explanation, Demonstration, Tactile cues, Verbal cues, and Handouts Education comprehension: verbalized understanding  HOME EXERCISE PROGRAM: Access Code: [email or web address] URL: [email or web address] Date: 09/25/2023 Prepared by: Amy Speaks  Exercises - Supine Lower Trunk Rotation  - 1 x daily - 7 x weekly - 3 sets - 10 reps - Long Sitting Ankle Plantar Flexion with Resistance  - 1 x daily - 7 x weekly - 3 sets - 10 reps Access Code: [email or web address] URL: [email or web address] Date: 09/27/2023 Prepared by: Ozell Mainland  Exercises - Hooklying Single Knee to Chest Stretch  - 2 x daily - 7 x weekly - 1 sets - 10 reps - 5 hold - Seated Table Hamstring Stretch  - 2 x daily - 7 x weekly - 1 sets - 5 reps - 30 hold - Seated Hamstring Stretch  - 2 x daily - 7 x weekly - 1 sets - 5 reps - 30 hold - Seated Hamstring Stretch with Chair  - 2 x daily - 7 x weekly - 1 sets - 5 reps - 30 hold - Supine Piriformis Stretch Pulling Heel to Hip  - 2 x daily - 7 x weekly - 1 sets - 5 reps - 30 hold ASSESSMENT:  CLINICAL IMPRESSION   Pt has been responding well to his current regimen and has been semi compliant with frequency of HEP dosage, encouraged increasing sets of lumbar extension during the day. He was able to address soft tissue mobilization and elongation today with good effect. Pt stands to benefit from continued skilled physical therapy to address deficit areas and restore safety with activities and participations at home and in the community.   .   OBJECTIVE IMPAIRMENTS: decreased balance, decreased endurance, decreased mobility,  difficulty walking, decreased strength, hypomobility, and pain.   ACTIVITY LIMITATIONS: sleeping, stairs, transfers, bed mobility, and locomotion level  PARTICIPATION LIMITATIONS: laundry, shopping, and community activity  PERSONAL FACTORS: Age, Behavior pattern, Fitness, Time since onset of injury/illness/exacerbation, and 1 comorbidity: cervical radiculopathy are also affecting patient's functional outcome.   REHAB POTENTIAL: Good  CLINICAL DECISION MAKING: Evolving/moderate complexity  EVALUATION COMPLEXITY: Moderate   GOALS: Goals reviewed  with patient? Yes  SHORT TERM GOALS: Target date: 2 weeks ,10/09/23 I HEP Baseline: Goal status:progressing 10/03/23   11/07/23 MET   LONG TERM GOALS: Target date: 12/19/23  Able to walk 30 min 3 to 5 x week without interference from B lower leg pain Baseline:  Goal status: Progressing 15-20 min a day 10/31/23  and 11/07/23 11/22/23 on going about 5-15 min  2.  Improve B plantarflexor strength L from 3+/5  and R form 4/5 to 5/5 for improved balance Baseline:  Goal status:progressing 11/14/23  11/22/23 MET  3.  Improve lumbar side bending to R to wnl without provocation of Sx Baseline:  Goal status: 10/17/23 Met 11/22/23 set back so ongoing   PLAN:  PT FREQUENCY: 2x/week  PT DURATION: 8 weeks  PLANNED INTERVENTIONS: 97110-Therapeutic exercises, 97530- Therapeutic activity, 97112- Neuromuscular re-education, 97535- Self Care, [id number]- Manual therapy, [id number]- Electrical stimulation (unattended), 97035- Ultrasound, [id number]- Traction (mechanical), and Patient/Family education  PLAN FOR NEXT SESSION: Continue with posture, flexibility and strength, see how the knee is doing try to push walking here and at home.   Stann DELENA Ohara, PT, 12/10/2023, 11:18 AM

## 2023-12-12 ENCOUNTER — Ambulatory Visit: Admitting: Physical Therapy

## 2023-12-12 ENCOUNTER — Encounter: Payer: Self-pay | Admitting: Physical Therapy

## 2023-12-12 DIAGNOSIS — R262 Difficulty in walking, not elsewhere classified: Secondary | ICD-10-CM

## 2023-12-12 DIAGNOSIS — M79661 Pain in right lower leg: Secondary | ICD-10-CM

## 2023-12-12 DIAGNOSIS — M79662 Pain in left lower leg: Secondary | ICD-10-CM

## 2023-12-12 NOTE — Therapy (Signed)
 OUTPATIENT PHYSICAL THERAPY LOWER EXTREMITY TREATMENT    Patient Name: Todd Larson MRN: [id number] DOB:11-19-1940, 83 y.o., male Today's Date: 12/12/2023  END OF SESSION:  PT End of Session - 12/12/23 1516     Visit Number 18    Date for Recertification  12/19/23    PT Start Time 1515    PT Stop Time 1600    PT Time Calculation (min) 45 min    Activity Tolerance Patient tolerated treatment well    Behavior During Therapy WFL for tasks assessed/performed           Past Medical History:  Diagnosis Date   Arthritis    cervical spine - T7-8 & lumbar area - bone spurs up & down the back    Bulging discs     thoracic spine   Diverticulosis of colon    GERD (gastroesophageal reflux disease)    related to intestinal system, - has started Align  4-5 days ago-    Hyperlipidemia    Hypertension    Inguinal hernia    OSA (obstructive sleep apnea)    saw Dr. Neysa- 02/2015- pt. remarks that as a result BP med. was changed , pt. has a sleep number bed & he reports the score averages in the 80% range    PSA elevation    History of    Recurrent sinus infections    related to fracture nose x2, also reports that he has a deviated septum   Past Surgical History:  Procedure Laterality Date   COLONOSCOPY  2008   3 internal hemorrhoids, diverticulosis;Dr.Jacobs   INSERTION OF MESH N/A 11/13/2016   Procedure: INSERTION OF MESH;  Surgeon: Rubin Calamity, MD;  Location: [facility] OR;  Service: General;  Laterality: N/A;   LAPAROSCOPIC INGUINAL HERNIA WITH UMBILICAL HERNIA Left 11/13/2016   Procedure: LAPAROSCOPIC LEFT INGUINAL HERNIA  AND UMBILICAL HERNIA REPAIRS WITH MESH;  Surgeon: Rubin Calamity, MD;  Location: [facility] OR;  Service: General;  Laterality: Left;   PROSTATE BIOPSY     x 2, once abnormal, once normal   TONSILLECTOMY     Patient Active Problem List   Diagnosis Date Noted   Right knee meniscal tear 06/01/2015   Obstructive sleep apnea 02/07/2015   Thoracic spine pain 03/29/2014    Tenosynovitis of thumb 03/29/2014   Cervical radiculopathy due to degenerative joint disease of spine 10/10/2013   Bursitis of left shoulder 09/23/2013   Chronic low back pain 12/15/2012   Chronic cervical pain 06/09/2012   Hyperlipidemia 07/06/2010   Essential hypertension 11/03/2009   Inguinal hernia 11/03/2009   Nonallergic rhinitis 08/24/2009   Ventral hernia 05/20/2009   Diverticulosis of colon 05/20/2009   Benign neoplasm of skin 10/26/2008   GERD 10/01/2006    PCP: Merna Huxley NP  REFERRING PROVIDER: Joane Birmingham, MD  REFERRING DIAG: lumbar radiculitis  THERAPY DIAG:  Difficulty in walking, not elsewhere classified  Pain in left lower leg  Pain in right lower leg  Rationale for Evaluation and Treatment: Rehabilitation  ONSET DATE: 6 months march 2025  SUBJECTIVE:   SUBJECTIVE STATEMENT: low back pain over the last few days. Has been increasing his walking up to 40 min  EVAL:  For 8 months I walked 30 min, every day, now something has changed and I just can't do it , it feels like my legs are logs or full of water  PERTINENT HISTORY: Reports wakes up every morning with pain B lower legs, ant and lateral lower legs, also same  pain pattern with walking .  Referred by sports med MD  PAIN:  Are you having pain?  Left knee 2/10. Radiating pain BIL LE 5/10 esp at night. Neck 1/10 but does increase  PRECAUTIONS: None  RED FLAGS: None   WEIGHT BEARING RESTRICTIONS: 2/10 neck and low back  FALLS:  Has patient fallen in last 6 months? No  LIVING ENVIRONMENT: Lives with: lives alone Lives in: House/apartment Stairs: No Has following equipment at home: Single point cane  OCCUPATION: retired, does foster cats  PLOF: Independent  PATIENT GOALS: be able to walk 30 min to get my activity level also wake up without pain  NEXT MD VISIT: 09/26/23  OBJECTIVE:  Note: Objective measures were completed at Evaluation unless otherwise noted.  DIAGNOSTIC  FINDINGS: x rays with arthritic changes L5/S1  PATIENT SURVEYS:  PSFS: THE PATIENT SPECIFIC FUNCTIONAL SCALE  Place score of 0-10 (0 = unable to perform activity and 10 = able to perform activity at the same level as before injury or problem)  Activity Date: 09/25/23 11/20/23   Walk greater than 20 min 2 7   2.stand from sleeping/lying down position  0 8   3.ascend/ descend 4 steps  0 2        Total Score 2 5.3     Total Score = Sum of activity scores/number of activities  Minimally Detectable Change: 3 points (for single activity); 2 points (for average score)  [name] (nd). The Patient Specific Functional Scale . Retrieved from [email or web address]   COGNITION: Overall cognitive status: Within functional limits for tasks assessed     SENSATION: WFL  EDEMA:  None noted  POSTURE: loss of lumbar lordosis, Er B hips, post pelvic tilt in standing and with gait  PALPATION: Non tender over post hips, tender with PA glide L2/3 Non tender B lower legs, ant, post musculature  LOWER EXTREMITY ROM:wfl B LUMBAR ROM: flex wnl, ext 50%, sb R with pain central lumbar, L sb wnl  LOWER EXTREMITY MMT:  MMT Right eval Left eval  Hip flexion wfl wfl  Hip extension    Hip abduction wfl wfl  Hip adduction    Hip internal rotation    Hip external rotation    Knee flexion    Knee extension    Ankle dorsiflexion    Ankle plantarflexion toe walking 4 3-  Ankle inversion    Ankle eversion     (Blank rows = not tested)  LOWER EXTREMITY SPECIAL TESTS:  na  FUNCTIONAL TESTS:  30 sec sit to sit 13 reps    GAIT: Distance walked: 62' in clinic, no deficits noted, no device                                                                                                                               TREATMENT DATE:  12/12/23 NuStep L 5 x 6 min HS curl 45lb 2x12 Leg Ext 10lb 2x12 Overhead  lumbar Ext yellow ball  2x10 STS with wt ball 10 x with chest press, 10 x with OH press Horiz abd green 2x10 Cervical retractions 2x10 Seated rows & Lats 35lb 2x10 Shoulder Ext 10lb 2x10   12/10/23 -PNF hold relax x10, 7 sec hold good effect, dec aching in BLE lower legs -Seated sciatic nerve glide in short sit 3x15 BLE alternating between sets -Fig 4 piriformis stretch in sitting x30 LLE -Slouch overcorrect x30 in sitting     11/26/23 NuStep L5 x 6 min Shoulder Ext 10lb 2x10 HS curl 45lb 2x10 Leg Ext 10lb 2x10 STS with wt ball 10 x with chest press, 10 x with OH press Overhead lumbar Ext yellow ball 2x10 Horiz abd green 2x10 Seated rows & Lats 35lb 2x10   11/22/23 Assessed goals for renewal Educ pnt on posture and hoe fwd head strains down back and he VU , educ on cerv strengthening is good to help muscles support vs spine. 20# upright row into back ext 2 sets 10 Wt ball OH ext 2 sets 10 On airex cable pulleys 10# 2 sets 10 each shld ext and row 10 x wall press into ext then  10 x Inclined press up into ext as prone is difficult- asked to do at home esp when increased pain to see if changes radiating symptoms 6 inch step up with UE support with opp leg ext then laterally with opp leg ext Active HS and calf stretching on step STS with wt ball 10 x with chest press, 10 x with OH press     11/20/23 NuStep L 5 x6 min Shoulder Ext 10lb 2x10 Seated rows & Lats 35lb 2x10 HS curl 35# 2x10 Knee ext 10# 2 sets 10 Bridges x10 Passive stretch LE's and lumbar spine  11/14/23 Passive stretch LE's and lumbar spine STM with the Tgun to the low back, HS, ITB, glutes and the rhomboid Gentle PPT, trunk rotation   11/12/23 Nustep L 5 Standing on foam mat shld ext and rows 2 sets 10 Upright row with trunk ext 2 sets 10 6 inch step up opp leg ext 10 x BIL with UE support Lats 25# 2 sets 10 Black tband trunk ext 20X STS with wt ball OH 10 x Feet on ball bridge,obl,KTC. Iso abdominals 15 x Red  tband SLR 10x BIL then SLR with abd 10 x ( had to take band off LLE d/t pain and weakness) PROM BIL LE and trunk  11/07/23 Walk outside 8 min working on increased paced, struggled with increased pace and on inclines. Fwd head, increased lumbar lordosis and kyphosis HS curl 35# 10x, 25# 10 x Knee ext 10# 2 sets 10 Seated row 25#  2 sets 10  Lats 25# 2 sets 10 Black tband trunk ext 20X Leg press 30# 10x then decrease 20# 10 x Black bar DF and PF 2 sets 10 Attempted but increased left knee pain and uncomfortable to did not continue-Red tband SL flex,ext and abd  PROM BIL LE and trunk    11/05/23 Nustep level 5 x 5 minutes Gait outside 2 laps no rest Seated row 20# Lats 20# Passive LE stretches STM to the HS  10/31/23 Gait around the building, fast pace no rest around the building Nutep level 5 x 6 minutes Back to wall red tband horizontal abduction 20# lats Slant board stretch 5# straight arm pulls STM to the upper traps Supine Feet on ball K2C, rotation, bridge, isometric abs Passive LE stretch, trunk strethces  10/29/23 Nustep L 6 -  can feel this more today in LB Seated row and lat pull down 25# 2 sets 10 Black tband trunk ext 2 sets 10 Shoulder ER green 2x10 - at wall Cervical retractions - at wall Shoulder horiz abd green 2x10 - at wall Ball vs wall push ups 5 x CC and CCW Seated isometric abdominals 2 sets 10 Supine feet on ball bridge,KTC and obl LE PROM and trunk    10/24/23 Feet on ball K2C, small rotations, posterior isometrics, isometric abs PPT PAssive stretches calves, HS, piriformis, adductors, quads  10/22/23 NuStep L 5 x 6 min  Shoulder ER green 2x10  Cervical retractions  Shoulder horiz abd green 2x10  Seated OHP blue ball 2x10 Sit to stand holding blue ball 2x12 Bridges x15  W/ March    Stopped after a few reps due to L HS cramping  10/17/23 Nustep L 5 Gait around the back building around widest part of parking lor Seated  Rows & Lats 35lb 2x12 Shoulder Ext 10lb 2x10 Lumbar ROM  WFL  S2S OHP yellow ball 2x10 Bridges x10 Feet on ball bridge, KTC,SLR,obl and iso abdominals PROM LE and trunk w/ end range holds   10/15/23 Nustep L 5 Standing HS stretch 20 sec 2 x BIL Standing step calf stretch 20 sec 3 x Black bar heel raises and toe raises 20 x 25# lat pull down and seated row 2 set 10 Black tband trunk flex and ext 20 x Wt ball trunk ext 10 x, trunk rotation 10 x each way Standing red tband hip ext and abd 2 sets 10- c/o increased increased pain with SLS Feet on ball bridge, KTC,SLR,obl and iso abdominals PROM LE and trunk    PATIENT EDUCATION:  Education details: POC, goals Person educated: Patient Education method: Explanation, Demonstration, Tactile cues, Verbal cues, and Handouts Education comprehension: verbalized understanding  HOME EXERCISE PROGRAM: Access Code: [email or web address] URL: [email or web address] Date: 09/25/2023 Prepared by: Amy Speaks  Exercises - Supine Lower Trunk Rotation  - 1 x daily - 7 x weekly - 3 sets - 10 reps - Long Sitting Ankle Plantar Flexion with Resistance  - 1 x daily - 7 x weekly - 3 sets - 10 reps Access Code: [email or web address] URL: [email or web address] Date: 09/27/2023 Prepared by: Ozell Mainland  Exercises - Hooklying Single Knee to Chest Stretch  - 2 x daily - 7 x weekly - 1 sets - 10 reps - 5 hold - Seated Table Hamstring Stretch  - 2 x daily - 7 x weekly - 1 sets - 5 reps - 30 hold - Seated Hamstring Stretch  - 2 x daily - 7 x weekly - 1 sets - 5 reps - 30 hold - Seated Hamstring Stretch with Chair  - 2 x daily - 7 x weekly - 1 sets - 5 reps - 30 hold - Supine Piriformis Stretch Pulling Heel to Hip  - 2 x daily - 7 x weekly - 1 sets - 5 reps - 30 hold ASSESSMENT:  CLINICAL IMPRESSION   Pt has been responding well to his current regimen , he has been able to progress with his daily walking. X-rays and MRI does so some nerve  compression. Postural cue given throughout to erect posture and extend cervical spine. Pt stands to benefit from continued skilled physical therapy to address deficit areas and restore safety with activities and participations at home and in the community.   .   OBJECTIVE IMPAIRMENTS: decreased balance, decreased  endurance, decreased mobility, difficulty walking, decreased strength, hypomobility, and pain.   ACTIVITY LIMITATIONS: sleeping, stairs, transfers, bed mobility, and locomotion level  PARTICIPATION LIMITATIONS: laundry, shopping, and community activity  PERSONAL FACTORS: Age, Behavior pattern, Fitness, Time since onset of injury/illness/exacerbation, and 1 comorbidity: cervical radiculopathy are also affecting patient's functional outcome.   REHAB POTENTIAL: Good  CLINICAL DECISION MAKING: Evolving/moderate complexity  EVALUATION COMPLEXITY: Moderate   GOALS: Goals reviewed with patient? Yes  SHORT TERM GOALS: Target date: 2 weeks ,10/09/23 I HEP Baseline: Goal status:progressing 10/03/23   11/07/23 MET   LONG TERM GOALS: Target date: 12/19/23  Able to walk 30 min 3 to 5 x week without interference from B lower leg pain Baseline:  Goal status: Progressing 15-20 min a day 10/31/23  and 11/07/23 11/22/23 on going about 5-15 min, Met 12/12/23  2.  Improve B plantarflexor strength L from 3+/5  and R form 4/5 to 5/5 for improved balance Baseline:  Goal status:progressing 11/14/23  11/22/23 MET  3.  Improve lumbar side bending to R to wnl without provocation of Sx Baseline:  Goal status: 10/17/23 Met 11/22/23 set back so ongoing   PLAN:  PT FREQUENCY: 2x/week  PT DURATION: 8 weeks  PLANNED INTERVENTIONS: 97110-Therapeutic exercises, 97530- Therapeutic activity, 97112- Neuromuscular re-education, 97535- Self Care, [id number]- Manual therapy, [id number]- Electrical stimulation (unattended), 97035- Ultrasound, [id number]- Traction (mechanical), and Patient/Family education  PLAN FOR  NEXT SESSION: Continue with posture, flexibility and strength, see how the knee is doing try to push walking here and at home.   Tanda KANDICE Sorrow, PTA, 12/12/2023, 3:16 PM

## 2023-12-17 ENCOUNTER — Ambulatory Visit: Admitting: Physical Therapy

## 2023-12-17 DIAGNOSIS — M79661 Pain in right lower leg: Secondary | ICD-10-CM

## 2023-12-17 DIAGNOSIS — R262 Difficulty in walking, not elsewhere classified: Secondary | ICD-10-CM

## 2023-12-17 DIAGNOSIS — M79662 Pain in left lower leg: Secondary | ICD-10-CM

## 2023-12-17 NOTE — Therapy (Signed)
 OUTPATIENT PHYSICAL THERAPY LOWER EXTREMITY TREATMENT    Patient Name: Todd Larson MRN: [id number] DOB:[birth year], 83 y.o., male Today's Date: 12/17/2023  END OF SESSION:  PT End of Session - 12/17/23 1310     Visit Number 19    Date for Recertification  12/19/23    Authorization Type Medicare    PT Start Time 1515    PT Stop Time 1555    PT Time Calculation (min) 40 min           Past Medical History:  Diagnosis Date   Arthritis    cervical spine - T7-8 & lumbar area - bone spurs up & down the back    Bulging discs     thoracic spine   Diverticulosis of colon    GERD (gastroesophageal reflux disease)    related to intestinal system, - has started Align  4-5 days ago-    Hyperlipidemia    Hypertension    Inguinal hernia    OSA (obstructive sleep apnea)    saw Dr. Neysa- 02/2015- pt. remarks that as a result BP med. was changed , pt. has a sleep number bed & he reports the score averages in the 80% range    PSA elevation    History of    Recurrent sinus infections    related to fracture nose x2, also reports that he has a deviated septum   Past Surgical History:  Procedure Laterality Date   COLONOSCOPY  2008   3 internal hemorrhoids, diverticulosis;Dr.Jacobs   INSERTION OF MESH N/A 11/13/2016   Procedure: INSERTION OF MESH;  Surgeon: Rubin Calamity, MD;  Location: [facility] OR;  Service: General;  Laterality: N/A;   LAPAROSCOPIC INGUINAL HERNIA WITH UMBILICAL HERNIA Left 11/13/2016   Procedure: LAPAROSCOPIC LEFT INGUINAL HERNIA  AND UMBILICAL HERNIA REPAIRS WITH MESH;  Surgeon: Rubin Calamity, MD;  Location: [facility] OR;  Service: General;  Laterality: Left;   PROSTATE BIOPSY     x 2, once abnormal, once normal   TONSILLECTOMY     Patient Active Problem List   Diagnosis Date Noted   Right knee meniscal tear 06/01/2015   Obstructive sleep apnea 02/07/2015   Thoracic spine pain 03/29/2014   Tenosynovitis of thumb 03/29/2014   Cervical radiculopathy due to degenerative  joint disease of spine 10/10/2013   Bursitis of left shoulder 09/23/2013   Chronic low back pain 12/15/2012   Chronic cervical pain 06/09/2012   Hyperlipidemia 07/06/2010   Essential hypertension 11/03/2009   Inguinal hernia 11/03/2009   Nonallergic rhinitis 08/24/2009   Ventral hernia 05/20/2009   Diverticulosis of colon 05/20/2009   Benign neoplasm of skin 10/26/2008   GERD 10/01/2006    PCP: Merna Huxley NP  REFERRING PROVIDER: Joane Birmingham, MD  REFERRING DIAG: lumbar radiculitis  THERAPY DIAG:  Difficulty in walking, not elsewhere classified  Pain in left lower leg  Pain in right lower leg  Rationale for Evaluation and Treatment: Rehabilitation  ONSET DATE: 6 months march 2025  SUBJECTIVE:   SUBJECTIVE STATEMENT: back to baseline that brought me here. Seems that I have undid the set back the epidural caused. Not sure current situation will get better until I get something done with back and I really need to make an appt with MD before I would get a 2nd inj. Leg pain bad in morning then gets better as day goes on. Walking back to baseline around 40 min.  EVAL:  For 8 months I walked 30 min, every day, now something has changed  and I just can't do it , it feels like my legs are logs or full of water  PERTINENT HISTORY: Reports wakes up every morning with pain B lower legs, ant and lateral lower legs, also same pain pattern with walking .  Referred by sports med MD  PAIN:  Are you having pain?  Left knee 2/10. Radiating pain BIL LE 5/10 esp at night. Neck 1/10 but does increase  PRECAUTIONS: None  RED FLAGS: None   WEIGHT BEARING RESTRICTIONS: 2/10 neck and low back  FALLS:  Has patient fallen in last 6 months? No  LIVING ENVIRONMENT: Lives with: lives alone Lives in: House/apartment Stairs: No Has following equipment at home: Single point cane  OCCUPATION: retired, does foster cats  PLOF: Independent  PATIENT GOALS: be able to walk 30 min to get my  activity level also wake up without pain  NEXT MD VISIT: 09/26/23  OBJECTIVE:  Note: Objective measures were completed at Evaluation unless otherwise noted.  DIAGNOSTIC FINDINGS: x rays with arthritic changes L5/S1  PATIENT SURVEYS:  PSFS: THE PATIENT SPECIFIC FUNCTIONAL SCALE  Place score of 0-10 (0 = unable to perform activity and 10 = able to perform activity at the same level as before injury or problem)  Activity Date: 09/25/23 11/20/23   Walk greater than 20 min 2 7   2.stand from sleeping/lying down position  0 8   3.ascend/ descend 4 steps  0 2        Total Score 2 5.3     Total Score = Sum of activity scores/number of activities  Minimally Detectable Change: 3 points (for single activity); 2 points (for average score)  [name] (nd). The Patient Specific Functional Scale . Retrieved from [email or web address]   COGNITION: Overall cognitive status: Within functional limits for tasks assessed     SENSATION: WFL  EDEMA:  None noted  POSTURE: loss of lumbar lordosis, Er B hips, post pelvic tilt in standing and with gait  PALPATION: Non tender over post hips, tender with PA glide L2/3 Non tender B lower legs, ant, post musculature  LOWER EXTREMITY ROM:wfl B LUMBAR ROM: flex wnl, ext 50%, sb R with pain central lumbar, L sb wnl  LOWER EXTREMITY MMT:  MMT Right eval Left eval  Hip flexion wfl wfl  Hip extension    Hip abduction wfl wfl  Hip adduction    Hip internal rotation    Hip external rotation    Knee flexion    Knee extension    Ankle dorsiflexion    Ankle plantarflexion toe walking 4 3-  Ankle inversion    Ankle eversion     (Blank rows = not tested)  LOWER EXTREMITY SPECIAL TESTS:  na  FUNCTIONAL TESTS:  30 sec sit to sit 13 reps    GAIT: Distance walked: 71' in clinic, no deficits noted, no device  TREATMENT DATE:   12/17/23 Nustep L 5 HS curl 45lb 2x12 Leg Ext 10lb 2x12 Overhead lumbar Ext yellow ball 10 x, trunk rotation 15 x wt ball 15 x each STS with wt ball 10 x with chest press, 10 x with OH press-on airex for increased core engagement CGA, some initial stab issues Horiz abd green 2x10 with cerv retraction on ball Cervical retractions 2x10 Seated rows & Lats 35lb 2x10 Shoulder Ext 10lb 2x10  Resisted trunk ext 2 sets 10     12/12/23 NuStep L 5 x 6 min HS curl 45lb 2x12 Leg Ext 10lb 2x12 Overhead lumbar Ext yellow ball 2x10 STS with wt ball 10 x with chest press, 10 x with OH press Horiz abd green 2x10 Cervical retractions 2x10 Seated rows & Lats 35lb 2x10 Shoulder Ext 10lb 2x10   12/10/23 -PNF hold relax x10, 7 sec hold good effect, dec aching in BLE lower legs -Seated sciatic nerve glide in short sit 3x15 BLE alternating between sets -Fig 4 piriformis stretch in sitting x30 LLE -Slouch overcorrect x30 in sitting     11/26/23 NuStep L5 x 6 min Shoulder Ext 10lb 2x10 HS curl 45lb 2x10 Leg Ext 10lb 2x10 STS with wt ball 10 x with chest press, 10 x with OH press Overhead lumbar Ext yellow ball 2x10 Horiz abd green 2x10 Seated rows & Lats 35lb 2x10   11/22/23 Assessed goals for renewal Educ pnt on posture and hoe fwd head strains down back and he VU , educ on cerv strengthening is good to help muscles support vs spine. 20# upright row into back ext 2 sets 10 Wt ball OH ext 2 sets 10 On airex cable pulleys 10# 2 sets 10 each shld ext and row 10 x wall press into ext then  10 x Inclined press up into ext as prone is difficult- asked to do at home esp when increased pain to see if changes radiating symptoms 6 inch step up with UE support with opp leg ext then laterally with opp leg ext Active HS and calf stretching on step STS with wt ball 10 x with chest press, 10 x with OH  press     11/20/23 NuStep L 5 x6 min Shoulder Ext 10lb 2x10 Seated rows & Lats 35lb 2x10 HS curl 35# 2x10 Knee ext 10# 2 sets 10 Bridges x10 Passive stretch LE's and lumbar spine  11/14/23 Passive stretch LE's and lumbar spine STM with the Tgun to the low back, HS, ITB, glutes and the rhomboid Gentle PPT, trunk rotation   11/12/23 Nustep L 5 Standing on foam mat shld ext and rows 2 sets 10 Upright row with trunk ext 2 sets 10 6 inch step up opp leg ext 10 x BIL with UE support Lats 25# 2 sets 10 Black tband trunk ext 20X STS with wt ball OH 10 x Feet on ball bridge,obl,KTC. Iso abdominals 15 x Red tband SLR 10x BIL then SLR with abd 10 x ( had to take band off LLE d/t pain and weakness) PROM BIL LE and trunk  11/07/23 Walk outside 8 min working on increased paced, struggled with increased pace and on inclines. Fwd head, increased lumbar lordosis and kyphosis HS curl 35# 10x, 25# 10 x Knee ext 10# 2 sets 10 Seated row 25#  2 sets 10  Lats 25# 2 sets 10 Black tband trunk ext 20X Leg press 30# 10x then decrease 20# 10 x Black bar DF and PF 2 sets 10  Attempted but increased left knee pain and uncomfortable to did not continue-Red tband SL flex,ext and abd  PROM BIL LE and trunk    11/05/23 Nustep level 5 x 5 minutes Gait outside 2 laps no rest Seated row 20# Lats 20# Passive LE stretches STM to the HS  10/31/23 Gait around the building, fast pace no rest around the building Nutep level 5 x 6 minutes Back to wall red tband horizontal abduction 20# lats Slant board stretch 5# straight arm pulls STM to the upper traps Supine Feet on ball K2C, rotation, bridge, isometric abs Passive LE stretch, trunk strethces  10/29/23 Nustep L 6 -  can feel this more today in LB Seated row and lat pull down 25# 2 sets 10 Black tband trunk ext 2 sets 10 Shoulder ER green 2x10 - at wall Cervical retractions - at wall Shoulder horiz abd green 2x10 - at wall Ball  vs wall push ups 5 x CC and CCW Seated isometric abdominals 2 sets 10 Supine feet on ball bridge,KTC and obl LE PROM and trunk    10/24/23 Feet on ball K2C, small rotations, posterior isometrics, isometric abs PPT PAssive stretches calves, HS, piriformis, adductors, quads  10/22/23 NuStep L 5 x 6 min  Shoulder ER green 2x10  Cervical retractions  Shoulder horiz abd green 2x10  Seated OHP blue ball 2x10 Sit to stand holding blue ball 2x12 Bridges x15  W/ March    Stopped after a few reps due to L HS cramping  10/17/23 Nustep L 5 Gait around the back building around widest part of parking lor Seated Rows & Lats 35lb 2x12 Shoulder Ext 10lb 2x10 Lumbar ROM  WFL  S2S OHP yellow ball 2x10 Bridges x10 Feet on ball bridge, KTC,SLR,obl and iso abdominals PROM LE and trunk w/ end range holds   10/15/23 Nustep L 5 Standing HS stretch 20 sec 2 x BIL Standing step calf stretch 20 sec 3 x Black bar heel raises and toe raises 20 x 25# lat pull down and seated row 2 set 10 Black tband trunk flex and ext 20 x Wt ball trunk ext 10 x, trunk rotation 10 x each way Standing red tband hip ext and abd 2 sets 10- c/o increased increased pain with SLS Feet on ball bridge, KTC,SLR,obl and iso abdominals PROM LE and trunk    PATIENT EDUCATION:  Education details: POC, goals Person educated: Patient Education method: Explanation, Demonstration, Tactile cues, Verbal cues, and Handouts Education comprehension: verbalized understanding  HOME EXERCISE PROGRAM: Access Code: [email or web address] URL: [email or web address] Date: 09/25/2023 Prepared by: Amy Speaks  Exercises - Supine Lower Trunk Rotation  - 1 x daily - 7 x weekly - 3 sets - 10 reps - Long Sitting Ankle Plantar Flexion with Resistance  - 1 x daily - 7 x weekly - 3 sets - 10 reps Access Code: [email or web address] URL: [email or web address] Date: 09/27/2023 Prepared by: Ozell Mainland  Exercises -  Hooklying Single Knee to Chest Stretch  - 2 x daily - 7 x weekly - 1 sets - 10 reps - 5 hold - Seated Table Hamstring Stretch  - 2 x daily - 7 x weekly - 1 sets - 5 reps - 30 hold - Seated Hamstring Stretch  - 2 x daily - 7 x weekly - 1 sets - 5 reps - 30 hold - Seated Hamstring Stretch with Chair  - 2 x daily - 7 x weekly - 1 sets - 5 reps - 30 hold -  Supine Piriformis Stretch Pulling Heel to Hip  - 2 x daily - 7 x weekly - 1 sets - 5 reps - 30 hold ASSESSMENT:  CLINICAL IMPRESSION    back to baseline that brought me here. Seems that I have undid the set back the epidural caused. Not sure current situation will get better until I get something done with back and I really need to make an appt with MD before I would get a 2nd inj. Leg pain bad in morning then gets better as day goes on. Walking back to baseline around 40 min. Progressed ex working on func strength with cuing for posture and core engagement, discussed ending therapy until further testing/inj and/or MD appt .   OBJECTIVE IMPAIRMENTS: decreased balance, decreased endurance, decreased mobility, difficulty walking, decreased strength, hypomobility, and pain.   ACTIVITY LIMITATIONS: sleeping, stairs, transfers, bed mobility, and locomotion level  PARTICIPATION LIMITATIONS: laundry, shopping, and community activity  PERSONAL FACTORS: Age, Behavior pattern, Fitness, Time since onset of injury/illness/exacerbation, and 1 comorbidity: cervical radiculopathy are also affecting patient's functional outcome.   REHAB POTENTIAL: Good  CLINICAL DECISION MAKING: Evolving/moderate complexity  EVALUATION COMPLEXITY: Moderate   GOALS: Goals reviewed with patient? Yes  SHORT TERM GOALS: Target date: 2 weeks ,10/09/23 I HEP Baseline: Goal status:progressing 10/03/23   11/07/23 MET   LONG TERM GOALS: Target date: 12/19/23  Able to walk 30 min 3 to 5 x week without interference from B lower leg pain Baseline:  Goal status: Progressing  15-20 min a day 10/31/23  and 11/07/23 11/22/23 on going about 5-15 min, Met 12/12/23  2.  Improve B plantarflexor strength L from 3+/5  and R form 4/5 to 5/5 for improved balance Baseline:  Goal status:progressing 11/14/23  11/22/23 MET  3.  Improve lumbar side bending to R to wnl without provocation of Sx Baseline:  Goal status: 10/17/23 Met 11/22/23 set back so ongoing   PLAN:  PT FREQUENCY: 2x/week  PT DURATION: 8 weeks  PLANNED INTERVENTIONS: 97110-Therapeutic exercises, 97530- Therapeutic activity, [id number]- Neuromuscular re-education, 97535- Self Care, [id number]- Manual therapy, [id number]- Electrical stimulation (unattended), 97035- Ultrasound, [id number]- Traction (mechanical), and Patient/Family education  PLAN FOR NEXT SESSION: ASSESS GOALS . D/C until after further testing/appt/? inj Tiernan Millikin,ANGIE, PTA, 12/17/2023, 1:54 PM

## 2023-12-19 ENCOUNTER — Ambulatory Visit: Admitting: Physical Therapy

## 2023-12-19 ENCOUNTER — Telehealth: Payer: Self-pay | Admitting: Family Medicine

## 2023-12-19 DIAGNOSIS — M5412 Radiculopathy, cervical region: Secondary | ICD-10-CM

## 2023-12-19 DIAGNOSIS — R262 Difficulty in walking, not elsewhere classified: Secondary | ICD-10-CM

## 2023-12-19 NOTE — Telephone Encounter (Signed)
 Forwarding to Dr. Joane to review and advise.   Forwarding to the front desk to assist with scheduling.

## 2023-12-19 NOTE — Therapy (Signed)
 OUTPATIENT PHYSICAL THERAPY LOWER EXTREMITY TREATMENT    Patient Name: Todd Larson MRN: [id number] DOB:November 23, 1940, 83 y.o., male Today's Date: 12/19/2023  END OF SESSION:  PT End of Session - 12/19/23 1138     Visit Number 20    Date for Recertification  12/19/23    Authorization Type Medicare    PT Start Time 1135    PT Stop Time 1150    PT Time Calculation (min) 15 min           Past Medical History:  Diagnosis Date   Arthritis    cervical spine - T7-8 & lumbar area - bone spurs up & down the back    Bulging discs     thoracic spine   Diverticulosis of colon    GERD (gastroesophageal reflux disease)    related to intestinal system, - has started Align  4-5 days ago-    Hyperlipidemia    Hypertension    Inguinal hernia    OSA (obstructive sleep apnea)    saw Dr. Neysa- 02/2015- pt. remarks that as a result BP med. was changed , pt. has a sleep number bed & he reports the score averages in the 80% range    PSA elevation    History of    Recurrent sinus infections    related to fracture nose x2, also reports that he has a deviated septum   Past Surgical History:  Procedure Laterality Date   COLONOSCOPY  2008   3 internal hemorrhoids, diverticulosis;Dr.Jacobs   INSERTION OF MESH N/A 11/13/2016   Procedure: INSERTION OF MESH;  Surgeon: Rubin Calamity, MD;  Location: [facility] OR;  Service: General;  Laterality: N/A;   LAPAROSCOPIC INGUINAL HERNIA WITH UMBILICAL HERNIA Left 11/13/2016   Procedure: LAPAROSCOPIC LEFT INGUINAL HERNIA  AND UMBILICAL HERNIA REPAIRS WITH MESH;  Surgeon: Rubin Calamity, MD;  Location: [facility] OR;  Service: General;  Laterality: Left;   PROSTATE BIOPSY     x 2, once abnormal, once normal   TONSILLECTOMY     Patient Active Problem List   Diagnosis Date Noted   Right knee meniscal tear 06/01/2015   Obstructive sleep apnea 02/07/2015   Thoracic spine pain 03/29/2014   Tenosynovitis of thumb 03/29/2014   Cervical radiculopathy due to degenerative  joint disease of spine 10/10/2013   Bursitis of left shoulder 09/23/2013   Chronic low back pain 12/15/2012   Chronic cervical pain 06/09/2012   Hyperlipidemia 07/06/2010   Essential hypertension 11/03/2009   Inguinal hernia 11/03/2009   Nonallergic rhinitis 08/24/2009   Ventral hernia 05/20/2009   Diverticulosis of colon 05/20/2009   Benign neoplasm of skin 10/26/2008   GERD 10/01/2006    PCP: Merna Huxley NP  REFERRING PROVIDER: Joane Birmingham, MD  REFERRING DIAG: lumbar radiculitis  THERAPY DIAG:  Difficulty in walking, not elsewhere classified  Rationale for Evaluation and Treatment: Rehabilitation  ONSET DATE: 6 months march 2025  SUBJECTIVE:   SUBJECTIVE STATEMENT:woke up this morning with increased back pain,felt like it is going to give out. Call into Dr Huxley. Did not want to cancel but not sure what I can do EVAL:  For 8 months I walked 30 min, every day, now something has changed and I just can't do it , it feels like my legs are logs or full of water  PERTINENT HISTORY: Reports wakes up every morning with pain B lower legs, ant and lateral lower legs, also same pain pattern with walking .  Referred by sports med MD  PAIN:  Are you having pain?  Left knee 2/10. Radiating pain BIL LE 5/10 esp at night. Neck 1/10 but does increase  PRECAUTIONS: None  RED FLAGS: None   WEIGHT BEARING RESTRICTIONS: 2/10 neck and low back  FALLS:  Has patient fallen in last 6 months? No  LIVING ENVIRONMENT: Lives with: lives alone Lives in: House/apartment Stairs: No Has following equipment at home: Single point cane  OCCUPATION: retired, does foster cats  PLOF: Independent  PATIENT GOALS: be able to walk 30 min to get my activity level also wake up without pain  NEXT MD VISIT: 09/26/23  OBJECTIVE:  Note: Objective measures were completed at Evaluation unless otherwise noted.  DIAGNOSTIC FINDINGS: x rays with arthritic changes L5/S1  PATIENT SURVEYS:  PSFS: THE  PATIENT SPECIFIC FUNCTIONAL SCALE  Place score of 0-10 (0 = unable to perform activity and 10 = able to perform activity at the same level as before injury or problem)  Activity Date: 09/25/23 11/20/23   Walk greater than 20 min 2 7   2.stand from sleeping/lying down position  0 8   3.ascend/ descend 4 steps  0 2        Total Score 2 5.3     Total Score = Sum of activity scores/number of activities  Minimally Detectable Change: 3 points (for single activity); 2 points (for average score)  [name] (nd). The Patient Specific Functional Scale . Retrieved from [email or web address]   COGNITION: Overall cognitive status: Within functional limits for tasks assessed     SENSATION: WFL  EDEMA:  None noted  POSTURE: loss of lumbar lordosis, Er B hips, post pelvic tilt in standing and with gait  PALPATION: Non tender over post hips, tender with PA glide L2/3 Non tender B lower legs, ant, post musculature  LOWER EXTREMITY ROM:wfl B LUMBAR ROM: flex wnl, ext 50%, sb R with pain central lumbar, L sb wnl  LOWER EXTREMITY MMT:  MMT Right eval Left eval  Hip flexion wfl wfl  Hip extension    Hip abduction wfl wfl  Hip adduction    Hip internal rotation    Hip external rotation    Knee flexion    Knee extension    Ankle dorsiflexion    Ankle plantarflexion toe walking 4 3-  Ankle inversion    Ankle eversion     (Blank rows = not tested)  LOWER EXTREMITY SPECIAL TESTS:  na  FUNCTIONAL TESTS:  30 sec sit to sit 13 reps    GAIT: Distance walked: 68' in clinic, no deficits noted, no device                                                                                                                               TREATMENT DATE:   12/19/23 Gentle PROM LE and trunk while on [facility]  12/17/23 Nustep L 5 HS curl 45lb 2x12 Leg Ext 10lb 2x12 Overhead lumbar Ext yellow ball 10  x, trunk rotation 15 x wt  ball 15 x each STS with wt ball 10 x with chest press, 10 x with OH press-on airex for increased core engagement CGA, some initial stab issues Horiz abd green 2x10 with cerv retraction on ball Cervical retractions 2x10 Seated rows & Lats 35lb 2x10 Shoulder Ext 10lb 2x10  Resisted trunk ext 2 sets 10     12/12/23 NuStep L 5 x 6 min HS curl 45lb 2x12 Leg Ext 10lb 2x12 Overhead lumbar Ext yellow ball 2x10 STS with wt ball 10 x with chest press, 10 x with OH press Horiz abd green 2x10 Cervical retractions 2x10 Seated rows & Lats 35lb 2x10 Shoulder Ext 10lb 2x10   12/10/23 -PNF hold relax x10, 7 sec hold good effect, dec aching in BLE lower legs -Seated sciatic nerve glide in short sit 3x15 BLE alternating between sets -Fig 4 piriformis stretch in sitting x30 LLE -Slouch overcorrect x30 in sitting     11/26/23 NuStep L5 x 6 min Shoulder Ext 10lb 2x10 HS curl 45lb 2x10 Leg Ext 10lb 2x10 STS with wt ball 10 x with chest press, 10 x with OH press Overhead lumbar Ext yellow ball 2x10 Horiz abd green 2x10 Seated rows & Lats 35lb 2x10   11/22/23 Assessed goals for renewal Educ pnt on posture and hoe fwd head strains down back and he VU , educ on cerv strengthening is good to help muscles support vs spine. 20# upright row into back ext 2 sets 10 Wt ball OH ext 2 sets 10 On airex cable pulleys 10# 2 sets 10 each shld ext and row 10 x wall press into ext then  10 x Inclined press up into ext as prone is difficult- asked to do at home esp when increased pain to see if changes radiating symptoms 6 inch step up with UE support with opp leg ext then laterally with opp leg ext Active HS and calf stretching on step STS with wt ball 10 x with chest press, 10 x with OH press     11/20/23 NuStep L 5 x6 min Shoulder Ext 10lb 2x10 Seated rows & Lats 35lb 2x10 HS curl 35# 2x10 Knee ext 10# 2 sets 10 Bridges x10 Passive stretch LE's and lumbar spine  11/14/23 Passive stretch LE's  and lumbar spine STM with the Tgun to the low back, HS, ITB, glutes and the rhomboid Gentle PPT, trunk rotation   11/12/23 Nustep L 5 Standing on foam mat shld ext and rows 2 sets 10 Upright row with trunk ext 2 sets 10 6 inch step up opp leg ext 10 x BIL with UE support Lats 25# 2 sets 10 Black tband trunk ext 20X STS with wt ball OH 10 x Feet on ball bridge,obl,KTC. Iso abdominals 15 x Red tband SLR 10x BIL then SLR with abd 10 x ( had to take band off LLE d/t pain and weakness) PROM BIL LE and trunk  11/07/23 Walk outside 8 min working on increased paced, struggled with increased pace and on inclines. Fwd head, increased lumbar lordosis and kyphosis HS curl 35# 10x, 25# 10 x Knee ext 10# 2 sets 10 Seated row 25#  2 sets 10  Lats 25# 2 sets 10 Black tband trunk ext 20X Leg press 30# 10x then decrease 20# 10 x Black bar DF and PF 2 sets 10 Attempted but increased left knee pain and uncomfortable to did not continue-Red tband SL flex,ext and abd  PROM BIL LE and  trunk    11/05/23 Nustep level 5 x 5 minutes Gait outside 2 laps no rest Seated row 20# Lats 20# Passive LE stretches STM to the HS  10/31/23 Gait around the building, fast pace no rest around the building Nutep level 5 x 6 minutes Back to wall red tband horizontal abduction 20# lats Slant board stretch 5# straight arm pulls STM to the upper traps Supine Feet on ball K2C, rotation, bridge, isometric abs Passive LE stretch, trunk strethces  10/29/23 Nustep L 6 -  can feel this more today in LB Seated row and lat pull down 25# 2 sets 10 Black tband trunk ext 2 sets 10 Shoulder ER green 2x10 - at wall Cervical retractions - at wall Shoulder horiz abd green 2x10 - at wall Ball vs wall push ups 5 x CC and CCW Seated isometric abdominals 2 sets 10 Supine feet on ball bridge,KTC and obl LE PROM and trunk    10/24/23 Feet on ball K2C, small rotations, posterior isometrics, isometric  abs PPT PAssive stretches calves, HS, piriformis, adductors, quads  10/22/23 NuStep L 5 x 6 min  Shoulder ER green 2x10  Cervical retractions  Shoulder horiz abd green 2x10  Seated OHP blue ball 2x10 Sit to stand holding blue ball 2x12 Bridges x15  W/ March    Stopped after a few reps due to L HS cramping  10/17/23 Nustep L 5 Gait around the back building around widest part of parking lor Seated Rows & Lats 35lb 2x12 Shoulder Ext 10lb 2x10 Lumbar ROM  WFL  S2S OHP yellow ball 2x10 Bridges x10 Feet on ball bridge, KTC,SLR,obl and iso abdominals PROM LE and trunk w/ end range holds   10/15/23 Nustep L 5 Standing HS stretch 20 sec 2 x BIL Standing step calf stretch 20 sec 3 x Black bar heel raises and toe raises 20 x 25# lat pull down and seated row 2 set 10 Black tband trunk flex and ext 20 x Wt ball trunk ext 10 x, trunk rotation 10 x each way Standing red tband hip ext and abd 2 sets 10- c/o increased increased pain with SLS Feet on ball bridge, KTC,SLR,obl and iso abdominals PROM LE and trunk    PATIENT EDUCATION:  Education details: POC, goals Person educated: Patient Education method: Explanation, Demonstration, Tactile cues, Verbal cues, and Handouts Education comprehension: verbalized understanding  HOME EXERCISE PROGRAM: Access Code: [email or web address] URL: [email or web address] Date: 09/25/2023 Prepared by: Amy Speaks  Exercises - Supine Lower Trunk Rotation  - 1 x daily - 7 x weekly - 3 sets - 10 reps - Long Sitting Ankle Plantar Flexion with Resistance  - 1 x daily - 7 x weekly - 3 sets - 10 reps Access Code: [email or web address] URL: [email or web address] Date: 09/27/2023 Prepared by: Ozell Mainland  Exercises - Hooklying Single Knee to Chest Stretch  - 2 x daily - 7 x weekly - 1 sets - 10 reps - 5 hold - Seated Table Hamstring Stretch  - 2 x daily - 7 x weekly - 1 sets - 5 reps - 30 hold - Seated Hamstring Stretch  - 2 x  daily - 7 x weekly - 1 sets - 5 reps - 30 hold - Seated Hamstring Stretch with Chair  - 2 x daily - 7 x weekly - 1 sets - 5 reps - 30 hold - Supine Piriformis Stretch Pulling Heel to Hip  - 2 x daily - 7 x weekly - 1 sets - 5 reps -  30 hold ASSESSMENT:  CLINICAL IMPRESSION   Pnt arrived in increased pain, woke up this morning and felt like back is going out. Gentle PROM LE and trunk while on MH but very uncomfortable. Palpation showed mild tenderness and no obvious alignment issues. Will process with D/C until after MD follow ups and possible injection  OBJECTIVE IMPAIRMENTS: decreased balance, decreased endurance, decreased mobility, difficulty walking, decreased strength, hypomobility, and pain.   ACTIVITY LIMITATIONS: sleeping, stairs, transfers, bed mobility, and locomotion level  PARTICIPATION LIMITATIONS: laundry, shopping, and community activity  PERSONAL FACTORS: Age, Behavior pattern, Fitness, Time since onset of injury/illness/exacerbation, and 1 comorbidity: cervical radiculopathy are also affecting patient's functional outcome.   REHAB POTENTIAL: Good  CLINICAL DECISION MAKING: Evolving/moderate complexity  EVALUATION COMPLEXITY: Moderate   GOALS: Goals reviewed with patient? Yes  SHORT TERM GOALS: Target date: 2 weeks ,10/09/23 I HEP Baseline: Goal status:progressing 10/03/23   11/07/23 MET   LONG TERM GOALS: Target date: 12/19/23  Able to walk 30 min 3 to 5 x week without interference from B lower leg pain Baseline:  Goal status: Progressing 15-20 min a day 10/31/23  and 11/07/23 11/22/23 on going about 5-15 min, Met 12/12/23  2.  Improve B plantarflexor strength L from 3+/5  and R form 4/5 to 5/5 for improved balance Baseline:  Goal status:progressing 11/14/23  11/22/23 MET  3.  Improve lumbar side bending to R to wnl without provocation of Sx Baseline:  Goal status: 10/17/23 Met 11/22/23 set back so ongoing   PLAN:  PT FREQUENCY: 2x/week  PT DURATION:  8 weeks  PLANNED INTERVENTIONS: 97110-Therapeutic exercises, 97530- Therapeutic activity, 97112- Neuromuscular re-education, 97535- Self Care, [id number]- Manual therapy, [id number]- Electrical stimulation (unattended), 97035- Ultrasound, 02987- Traction (mechanical), and Patient/Family education  PLAN FOR NEXT SESSION:  D/C   PHYSICAL THERAPY DISCHARGE SUMMARY   Patient agrees to discharge. Patient goals were met. Patient is being discharged due to meeting the stated rehab goals.  Almetta Fam, PT, DPT [place], PTA, 12/19/2023, 11:54 AM

## 2023-12-19 NOTE — Telephone Encounter (Signed)
 Message sent to Dr. Joane to review and advise - patient call.

## 2023-12-19 NOTE — Telephone Encounter (Addendum)
 Forwarding to Dr. Joane to review and advise.   Per MyChart message from pt:  Todd Larson to [facility] (supporting Artist GORMAN Joane, MD)     12/19/23  7:56 AM Id like to schedule a visit as a next step. This morning it felt as if my back was going to give out on me and Id fall. Ive had a lesser version of this a Few times over the past few weeks but today was much worse. I have a physical therapy session scheduled for 11:45 today and can ask the therapist for some thoughts. On the other hand, Ive done much better with walking: Ive averaged 31 minutes of walking per day over the past 15 days.Ive woken up with a sore back a few times over the past few weeks but the this is the first time I was seriously concerned with a fall.

## 2023-12-19 NOTE — Telephone Encounter (Signed)
 Patient called stating that he is having new back spasms. He was his physical therapist this morning and she advised him to let Dr Joane know. He has not had this before but had some previous issues after an epidural.  I was going to schedule him and appointment but first available is not until January 5th and he would only like to see Dr Joane.   Please advise.

## 2023-12-20 NOTE — Telephone Encounter (Signed)
 We could order another back injection.  What does he think about that?

## 2023-12-20 NOTE — Telephone Encounter (Signed)
 Dr Joane responded in other phone note.

## 2023-12-23 ENCOUNTER — Ambulatory Visit: Payer: Medicare Other

## 2023-12-23 VITALS — BP 128/62 | HR 94 | Temp 98.6°F | Ht 68.0 in | Wt 215.2 lb

## 2023-12-23 DIAGNOSIS — Z0000 Encounter for general adult medical examination without abnormal findings: Secondary | ICD-10-CM

## 2023-12-23 NOTE — Patient Instructions (Addendum)
 Mr. Somers,  Thank you for taking the time for your Medicare Wellness Visit. I appreciate your continued commitment to your health goals. Please review the care plan we discussed, and feel free to reach out if I can assist you further.  Please note that Annual Wellness Visits do not include a physical exam. Some assessments may be limited, especially if the visit was conducted virtually. If needed, we may recommend an in-person follow-up with your provider.  Ongoing Care Seeing your primary care provider every 3 to 6 months helps us  monitor your health and provide consistent, personalized care.   Referrals If a referral was made during today's visit and you haven't received any updates within two weeks, please contact the referred provider directly to check on the status.  Recommended Screenings:  Health Maintenance  Topic Date Due   COVID-19 Vaccine (7 - 2025-26 season) 09/02/2023   Medicare Annual Wellness Visit  12/22/2024   DTaP/Tdap/Td vaccine (4 - Td or Tdap) 04/01/2032   Pneumococcal Vaccine for age over 50  Completed   Flu Shot  Completed   Zoster (Shingles) Vaccine  Completed   Meningitis B Vaccine  Aged Out   Hepatitis C Screening  Discontinued   Cologuard (Stool DNA test)  Discontinued       12/23/2023    9:29 AM  Advanced Directives  Does Patient Have a Medical Advance Directive? Yes  Type of Estate Agent of [place];Living will  Does patient want to make changes to medical advance directive? No - Patient declined  Copy of Healthcare Power of Attorney in Chart? Yes - validated most recent copy scanned in chart (See row information)    Vision: Annual vision screenings are recommended for early detection of glaucoma, cataracts, and diabetic retinopathy. These exams can also reveal signs of chronic conditions such as diabetes and high blood pressure.  Dental: Annual dental screenings help detect early signs of oral cancer, gum disease, and other  conditions linked to overall health, including heart disease and diabetes.  Please see the attached documents for additional preventive care recommendations.

## 2023-12-23 NOTE — Addendum Note (Signed)
 Addended by: MARDY LEOTIS RAMAN on: 12/23/2023 08:32 AM   Modules accepted: Orders

## 2023-12-23 NOTE — Progress Notes (Signed)
 "  Chief Complaint  Patient presents with   Medicare Wellness     Subjective:   Todd Larson is a 83 y.o. male who presents for a Medicare Annual Wellness Visit.  Visit info / Clinical Intake: Medicare Wellness Visit Type:: Subsequent Annual Wellness Visit Persons participating in visit and providing information:: patient Medicare Wellness Visit Mode:: In-person (required for WTM) Interpreter Needed?: No Pre-visit prep was completed: yes AWV questionnaire completed by patient prior to visit?: no Living arrangements:: (!) lives alone Patient's Overall Health Status Rating: very good Typical amount of pain: some (Followed by medical attention) Does pain affect daily life?: (!) yes (Followed by medical attention) Are you currently prescribed opioids?: no  Dietary Habits and Nutritional Risks How many meals a day?: 3 Eats fruit and vegetables daily?: yes Most meals are obtained by: preparing own meals In the last 2 weeks, have you had any of the following?: none Diabetic:: no  Functional Status Activities of Daily Living (to include ambulation/medication): Independent Ambulation: Independent with device- listed below Home Assistive Devices/Equipment: Eyeglasses Medication Administration: Independent Home Management (perform basic housework or laundry): Independent Manage your own finances?: yes Primary transportation is: driving Concerns about vision?: no *vision screening is required for WTM* Concerns about hearing?: no  Fall Screening Falls in the past year?: 0 Number of falls in past year: 0 Was there an injury with Fall?: 0 Fall Risk Category Calculator: 0 Patient Fall Risk Level: Low Fall Risk  Fall Risk Patient at Risk for Falls Due to: No Fall Risks Fall risk Follow up: Falls evaluation completed; Education provided  Home and Transportation Safety: All rugs have non-skid backing?: yes All stairs or steps have railings?: N/A, no stairs Grab bars in the bathtub  or shower?: (!) no Have non-skid surface in bathtub or shower?: (!) no Good home lighting?: yes Regular seat belt use?: yes Hospital stays in the last year:: no  Cognitive Assessment Difficulty concentrating, remembering, or making decisions? : no Will 6CIT or Mini Cog be Completed: yes What year is it?: 0 points What month is it?: 0 points Give patient an address phrase to remember (5 components): [address] [place]  About what time is it?: 0 points Count backwards from 20 to 1: 0 points Say the months of the year in reverse: 0 points Repeat the address phrase from earlier: 0 points 6 CIT Score: 0 points  Advance Directives (For Healthcare) Does Patient Have a Medical Advance Directive?: Yes Does patient want to make changes to medical advance directive?: No - Patient declined Type of Advance Directive: Healthcare Power of Todd Larson; Living will Copy of Healthcare Power of Attorney in Chart?: Yes - validated most recent copy scanned in chart (See row information) Copy of Living Will in Chart?: Yes - validated most recent copy scanned in chart (See row information)  Reviewed/Updated  Reviewed/Updated: Reviewed All (Medical, Surgical, Family, Medications, Allergies, Care Teams, Patient Goals)    Allergies (verified) Amoxicillin -pot clavulanate   Current Medications (verified) Outpatient Encounter Medications as of 12/23/2023  Medication Sig   Blood Pressure Monitoring (ADULT BLOOD PRESSURE CUFF LG) KIT To monitor Blood pressure cuff   carbamide peroxide (DEBROX) 6.5 % OTIC solution Place 5 drops into both ears daily as needed.   Cholecalciferol (VITAMIN D -3 PO) Take 2,000 Units by mouth daily at 12 noon.   finasteride (PROSCAR) 5 MG tablet Take 5 mg by mouth daily.   fluticasone  (FLONASE ) 50 MCG/ACT nasal spray USE 1 SPRAY(S) IN EACH NOSTRIL TWICE DAILY  AS NEEDED FOR RUNNY NOSE   hydrochlorothiazide  (HYDRODIURIL ) 12.5 MG tablet Take 1 tablet (12.5 mg total) by mouth  daily.   loratadine  (CLARITIN ) 10 MG tablet Take 1 tablet (10 mg total) by mouth daily.   losartan  (COZAAR ) 50 MG tablet Take 1 tablet (50 mg total) by mouth daily.   Multiple Vitamins-Minerals (ONE A DAY MEN 50 PLUS PO) Take by mouth daily.   Polyvinyl Alcohol-Povidone (REFRESH OP) Place 1 drop as needed into both eyes (for dry eyes).   No facility-administered encounter medications on file as of 12/23/2023.    History: Past Medical History:  Diagnosis Date   Arthritis    cervical spine - T7-8 & lumbar area - bone spurs up & down the back    Bulging discs     thoracic spine   Diverticulosis of colon    GERD (gastroesophageal reflux disease)    related to intestinal system, - has started Align  4-5 days ago-    Hyperlipidemia    Hypertension    Inguinal hernia    OSA (obstructive sleep apnea)    saw Dr. Neysa- 02/2015- pt. remarks that as a result BP med. was changed , pt. has a sleep number bed & he reports the score averages in the 80% range    PSA elevation    History of    Recurrent sinus infections    related to fracture nose x2, also reports that he has a deviated septum   Past Surgical History:  Procedure Laterality Date   COLONOSCOPY  2008   3 internal hemorrhoids, diverticulosis;Dr.Jacobs   INSERTION OF MESH N/A 11/13/2016   Procedure: INSERTION OF MESH;  Surgeon: Rubin Calamity, MD;  Location: [facility] OR;  Service: General;  Laterality: N/A;   LAPAROSCOPIC INGUINAL HERNIA WITH UMBILICAL HERNIA Left 11/13/2016   Procedure: LAPAROSCOPIC LEFT INGUINAL HERNIA  AND UMBILICAL HERNIA REPAIRS WITH MESH;  Surgeon: Rubin Calamity, MD;  Location: [facility] OR;  Service: General;  Laterality: Left;   PROSTATE BIOPSY     x 2, once abnormal, once normal   TONSILLECTOMY     Family History  Problem Relation Age of Onset   Lung cancer Maternal Grandfather        enviromental    Heart attack Paternal Uncle    Prostate cancer Brother    Social History   Occupational History    Occupation: retired  Tobacco Use   Smoking status: Never   Smokeless tobacco: Never  Vaping Use   Vaping status: Never Used  Substance and Sexual Activity   Alcohol use: Yes    Alcohol/week: 14.0 standard drinks of alcohol    Types: 14 Glasses of wine per week   Drug use: No   Sexual activity: Not Currently   Tobacco Counseling Counseling given: No  SDOH Screenings   Food Insecurity: No Food Insecurity (12/23/2023)  Housing: Unknown (12/23/2023)  Transportation Needs: No Transportation Needs (12/23/2023)  Utilities: Not At Risk (12/23/2023)  Alcohol Screen: Low Risk (12/19/2022)  Depression (PHQ2-9): Low Risk (12/23/2023)  Financial Resource Strain: Low Risk (12/19/2022)  Physical Activity: Sufficiently Active (12/23/2023)  Social Connections: Socially Isolated (12/23/2023)  Stress: No Stress Concern Present (12/23/2023)  Tobacco Use: Low Risk (12/23/2023)  Health Literacy: Adequate Health Literacy (12/23/2023)   See flowsheets for full screening details  Depression Screen PHQ 2 & 9 Depression Scale- Over the past 2 weeks, how often have you been bothered by any of the following problems? Little interest or pleasure in doing things: 0 Feeling  down, depressed, or hopeless (PHQ Adolescent also includes...irritable): 0 PHQ-2 Total Score: 0     Goals Addressed               This Visit's Progress     Remain active (pt-stated)        Remain active (pt-stated)               Objective:    Today's Vitals   12/23/23 0922  BP: 128/62  Pulse: 94  Temp: 98.6 F (37 C)  TempSrc: Oral  SpO2: 95%  Weight: 215 lb 3.2 oz (97.6 kg)  Height: 5' 8 (1.727 m)   Body mass index is 32.72 kg/m.  Hearing/Vision screen Hearing Screening - Comments:: Denies hearing difficulties   Vision Screening - Comments:: Wears rx glasses - up to date with routine eye exams with  [facility] Immunizations and Health Maintenance Health Maintenance  Topic Date Due   COVID-19  Vaccine (7 - 2025-26 season) 09/02/2023   Medicare Annual Wellness (AWV)  12/22/2024   DTaP/Tdap/Td (4 - Td or Tdap) 04/01/2032   Pneumococcal Vaccine: 50+ Years  Completed   Influenza Vaccine  Completed   Zoster Vaccines- Shingrix  Completed   Meningococcal B Vaccine  Aged Out   Hepatitis C Screening  Discontinued   Fecal DNA (Cologuard)  Discontinued        Assessment/Plan:  This is a routine wellness examination for Todd Larson.  Patient Care Team: Merna Huxley, NP as PCP - General (Family Medicine) Puschinsky, Charlie ORN., MD (Urology)  I have personally reviewed and noted the following in the patients chart:   Medical and social history Use of alcohol, tobacco or illicit drugs  Current medications and supplements including opioid prescriptions. Functional ability and status Nutritional status Physical activity Advanced directives List of other physicians Hospitalizations, surgeries, and ER visits in previous 12 months Vitals Screenings to include cognitive, depression, and falls Referrals and appointments  No orders of the defined types were placed in this encounter.  In addition, I have reviewed and discussed with patient certain preventive protocols, quality metrics, and best practice recommendations. A written personalized care plan for preventive services as well as general preventive health recommendations were provided to patient.   Rojelio ORN Blush, LPN   [id number]   Return in 53 weeks (on 12/28/2024).  After Visit Summary: (In Person-Declined) Patient declined AVS at this time.  Nurse Notes: No voiced or noted concerns at this time "

## 2024-01-07 NOTE — Telephone Encounter (Signed)
 Per Wilbern with [facility] -  We also dont do consultations for injections

## 2024-01-09 NOTE — Addendum Note (Signed)
 Addended by: GEROME ILEANA RAMAN on: 01/09/2024 03:50 PM   Modules accepted: Orders

## 2024-01-09 NOTE — Telephone Encounter (Signed)
 Forwarding to Dr. Joane to review and advise as it appears that pt cannot schedule a consult about the procedure prior to having the procedure.   Per previous message from pt:  JAYMON DUDEK to Ileana GORMAN [place]     12/20/23  8:59 AM I dont know how much flexibility they have to accommodate my issue with a head that is canted forward. I would hate to schedule an epidural only to find out they cant accommodate. Hence, talking to someone ahead of time seems to be the better option. Regarding the issue with a tub to shower conversion, Ill contact the property manager and ask for clarification on the wording and then forward her remarks. Thanks for the update.

## 2024-01-10 NOTE — Telephone Encounter (Signed)
 We have gone back and forth and have come up with a plan.  Will order an injection and radiologist will talk to the patient before the injection.

## 2024-01-23 ENCOUNTER — Telehealth: Payer: Self-pay | Admitting: Family Medicine

## 2024-01-23 DIAGNOSIS — M5416 Radiculopathy, lumbar region: Secondary | ICD-10-CM

## 2024-01-23 NOTE — Telephone Encounter (Signed)
 Patient called and stated that [facility] called and was trying to schedule for the epidural for the cervical back and he was thinking he was supposed to be having it done in his lower back. If someone can please give him a phone call in regards to this.

## 2024-01-24 NOTE — Telephone Encounter (Signed)
 Pt called back and was provided Dr. Virgilio advise. Order changed to lumbar ESI. Advised to call [facility] to schedule.

## 2024-01-24 NOTE — Telephone Encounter (Signed)
 Please change order to lumbar injection and inform patient.

## 2024-01-24 NOTE — Addendum Note (Signed)
 Addended by: GEROME ILEANA RAMAN on: 01/24/2024 12:03 PM   Modules accepted: Orders

## 2024-02-05 ENCOUNTER — Emergency Department (HOSPITAL_COMMUNITY)

## 2024-02-05 ENCOUNTER — Emergency Department (HOSPITAL_COMMUNITY)
Admission: EM | Admit: 2024-02-05 | Discharge: 2024-02-05 | Disposition: A | Discharge status: Home / Self Care | Attending: Emergency Medicine | Admitting: Emergency Medicine

## 2024-02-05 ENCOUNTER — Encounter (HOSPITAL_COMMUNITY): Payer: Self-pay | Admitting: Emergency Medicine

## 2024-02-05 ENCOUNTER — Other Ambulatory Visit: Payer: Self-pay

## 2024-02-05 ENCOUNTER — Ambulatory Visit
Admission: EM | Admit: 2024-02-05 | Discharge: 2024-02-05 | Disposition: A | Discharge status: Home / Self Care | Source: Home / Self Care

## 2024-02-05 DIAGNOSIS — R42 Dizziness and giddiness: Secondary | ICD-10-CM | POA: Insufficient documentation

## 2024-02-05 DIAGNOSIS — Z79899 Other long term (current) drug therapy: Secondary | ICD-10-CM | POA: Insufficient documentation

## 2024-02-05 DIAGNOSIS — R112 Nausea with vomiting, unspecified: Secondary | ICD-10-CM | POA: Insufficient documentation

## 2024-02-05 DIAGNOSIS — H6993 Unspecified Eustachian tube disorder, bilateral: Secondary | ICD-10-CM | POA: Diagnosis not present

## 2024-02-05 DIAGNOSIS — D72829 Elevated white blood cell count, unspecified: Secondary | ICD-10-CM | POA: Insufficient documentation

## 2024-02-05 DIAGNOSIS — I10 Essential (primary) hypertension: Secondary | ICD-10-CM | POA: Insufficient documentation

## 2024-02-05 LAB — URINALYSIS, ROUTINE W REFLEX MICROSCOPIC
Bilirubin Urine: NEGATIVE
Glucose, UA: NEGATIVE mg/dL
Hgb urine dipstick: NEGATIVE
Ketones, ur: NEGATIVE mg/dL
Leukocytes,Ua: NEGATIVE
Nitrite: NEGATIVE
Protein, ur: NEGATIVE mg/dL
Specific Gravity, Urine: 1.013 (ref 1.005–1.030)
pH: 7 (ref 5.0–8.0)

## 2024-02-05 LAB — COMPREHENSIVE METABOLIC PANEL WITH GFR
ALT: 29 U/L (ref 0–44)
AST: 27 U/L (ref 15–41)
Albumin: 4.2 g/dL (ref 3.5–5.0)
Alkaline Phosphatase: 60 U/L (ref 38–126)
Anion gap: 12 (ref 5–15)
BUN: 11 mg/dL (ref 8–23)
CO2: 26 mmol/L (ref 22–32)
Calcium: 9.3 mg/dL (ref 8.9–10.3)
Chloride: 101 mmol/L (ref 98–111)
Creatinine, Ser: 0.72 mg/dL (ref 0.61–1.24)
GFR, Estimated: >60 mL/min
Glucose, Bld: 152 mg/dL — ABNORMAL HIGH (ref 70–99)
Potassium: 3.7 mmol/L (ref 3.5–5.1)
Sodium: 139 mmol/L (ref 135–145)
Total Bilirubin: 1 mg/dL (ref 0.0–1.2)
Total Protein: 6.5 g/dL (ref 6.5–8.1)

## 2024-02-05 LAB — CBC WITH DIFFERENTIAL/PLATELET
Abs Immature Granulocytes: 0.19 10*3/uL — ABNORMAL HIGH (ref 0.00–0.07)
Basophils Absolute: 0.1 10*3/uL (ref 0.0–0.1)
Basophils Relative: 1 %
Eosinophils Absolute: 0.1 10*3/uL (ref 0.0–0.5)
Eosinophils Relative: 1 %
HCT: 45.7 % (ref 39.0–52.0)
Hemoglobin: 15.3 g/dL (ref 13.0–17.0)
Immature Granulocytes: 2 %
Lymphocytes Relative: 7 %
Lymphs Abs: 0.8 10*3/uL (ref 0.7–4.0)
MCH: 32.8 pg (ref 26.0–34.0)
MCHC: 33.5 g/dL (ref 30.0–36.0)
MCV: 97.9 fL (ref 80.0–100.0)
Monocytes Absolute: 0.9 10*3/uL (ref 0.1–1.0)
Monocytes Relative: 8 %
Neutro Abs: 9.6 10*3/uL — ABNORMAL HIGH (ref 1.7–7.7)
Neutrophils Relative %: 81 %
Platelets: 295 10*3/uL (ref 150–400)
RBC: 4.67 MIL/uL (ref 4.22–5.81)
RDW: 13.6 % (ref 11.5–15.5)
WBC: 11.6 10*3/uL — ABNORMAL HIGH (ref 4.0–10.5)
nRBC: 0 % (ref 0.0–0.2)

## 2024-02-05 LAB — TROPONIN T, HIGH SENSITIVITY
Troponin T High Sensitivity: 9 ng/L (ref 0–19)
Troponin T High Sensitivity: 9 ng/L (ref 0–19)

## 2024-02-05 LAB — LIPASE, BLOOD: Lipase: 32 U/L (ref 11–51)

## 2024-02-05 LAB — MAGNESIUM: Magnesium: 2.1 mg/dL (ref 1.7–2.4)

## 2024-02-05 MED ORDER — LACTATED RINGERS IV BOLUS
1000.0000 mL | Freq: Once | INTRAVENOUS | Status: AC
  Administered 2024-02-05: 1000 mL via INTRAVENOUS

## 2024-02-05 MED ORDER — MECLIZINE HCL 12.5 MG PO TABS: 12.5000 mg | ORAL_TABLET | Freq: Three times a day (TID) | ORAL | 0 refills | Status: AC | PRN

## 2024-02-05 MED ORDER — PREDNISONE 10 MG PO TABS: 30.0000 mg | ORAL_TABLET | Freq: Every day | ORAL | 0 refills | Status: AC

## 2024-02-05 MED ORDER — ONDANSETRON 4 MG PO TBDP: 4.0000 mg | ORAL_TABLET | Freq: Three times a day (TID) | ORAL | 0 refills | Status: AC | PRN

## 2024-02-05 MED ORDER — ONDANSETRON HCL 4 MG/2ML IJ SOLN
4.0000 mg | Freq: Once | INTRAMUSCULAR | Status: AC
  Administered 2024-02-05: 4 mg via INTRAVENOUS
  Filled 2024-02-05: qty 2

## 2024-02-05 MED ORDER — IOHEXOL 350 MG/ML SOLN
100.0000 mL | Freq: Once | INTRAVENOUS | Status: AC | PRN
  Administered 2024-02-05: 100 mL via INTRAVENOUS

## 2024-02-05 NOTE — ED Provider Notes (Signed)
 " [place] EMERGENCY DEPARTMENT AT [facility] Provider Note   CSN: [id number] Arrival date & time: 02/05/24  9771     Patient presents with: Emesis   Todd Larson is a 84 y.o. male.    Emesis Patient presents for emesis.  Medical history includes HTN, HLD, OSA, GERD, arthritis.  Last night, he went to sleep in his normal state of health.  He woke up at around 1:30 AM with bilateral tinnitus.  He sat up on the edge of bed and felt lightheaded.  He then had nausea and vomiting.  Vomiting was persistent initially but has since improved.  He no longer has tinnitus, dizziness, or lightheadedness.  He does have some continued nausea.  Last night for dinner, he ate some 3-day old coleslaw which he thinks may have caused his symptoms.  He denies any areas of discomfort.      Prior to Admission medications  Medication Sig Start Date End Date Taking? Authorizing Provider  ondansetron  (ZOFRAN -ODT) 4 MG disintegrating tablet Take 1 tablet (4 mg total) by mouth every 8 (eight) hours as needed for nausea or vomiting. 02/05/24  Yes Melvenia Motto, MD  Blood Pressure Monitoring (ADULT BLOOD PRESSURE CUFF LG) KIT To monitor Blood pressure cuff 05/21/23   Nafziger, Darleene, NP  carbamide peroxide (DEBROX) 6.5 % OTIC solution Place 5 drops into both ears daily as needed. 03/04/22   [name], PA-C  Cholecalciferol (VITAMIN D -3 PO) Take 2,000 Units by mouth daily at 12 noon.    [provider]  finasteride (PROSCAR) 5 MG tablet Take 5 mg by mouth daily.    [provider]  fluticasone  (FLONASE ) 50 MCG/ACT nasal spray USE 1 SPRAY(S) IN EACH NOSTRIL TWICE DAILY AS NEEDED FOR RUNNY NOSE 05/18/22   Nafziger, Darleene, NP  hydrochlorothiazide  (HYDRODIURIL ) 12.5 MG tablet Take 1 tablet (12.5 mg total) by mouth daily. 09/25/23   Nafziger, Darleene, NP  loratadine  (CLARITIN ) 10 MG tablet Take 1 tablet (10 mg total) by mouth daily. 04/23/23   [name], PA-C  losartan  (COZAAR ) 50 MG tablet Take 1 tablet  (50 mg total) by mouth daily. 08/13/23   Nafziger, Darleene, NP  Multiple Vitamins-Minerals (ONE A DAY MEN 50 PLUS PO) Take by mouth daily.    [provider]  Polyvinyl Alcohol-Povidone (REFRESH OP) Place 1 drop as needed into both eyes (for dry eyes).    [provider]    Allergies: Amoxicillin -pot clavulanate    Review of Systems  HENT:  Positive for tinnitus.   Gastrointestinal:  Positive for nausea and vomiting.  Neurological:  Positive for dizziness, weakness (Generalized) and light-headedness.  All other systems reviewed and are negative.   Updated Vital Signs BP (!) 140/70 (BP Location: Left Arm)   Pulse 91   Temp 98 F (36.7 C) (Oral)   Resp 16   Ht 5' 8 (1.727 m)   Wt 97.5 kg   SpO2 98%   BMI 32.69 kg/m   Physical Exam Vitals and nursing note reviewed.  Constitutional:      General: He is not in acute distress.    Appearance: Normal appearance. He is well-developed. He is not ill-appearing, toxic-appearing or diaphoretic.  HENT:     Head: Normocephalic and atraumatic.     Right Ear: Tympanic membrane, ear canal and external ear normal.     Left Ear: Tympanic membrane, ear canal and external ear normal.     Ears:     Comments: Nonimpacted cerumen present bilaterally  Nose: Nose normal.     Mouth/Throat:     Mouth: Mucous membranes are moist.  Eyes:     Extraocular Movements: Extraocular movements intact.     Conjunctiva/sclera: Conjunctivae normal.  Cardiovascular:     Rate and Rhythm: Normal rate and regular rhythm.  Pulmonary:     Effort: Pulmonary effort is normal. No respiratory distress.     Breath sounds: No wheezing or rales.  Chest:     Chest wall: No tenderness.  Abdominal:     General: There is no distension.     Palpations: Abdomen is soft.     Tenderness: There is no abdominal tenderness.  Musculoskeletal:        General: No swelling or deformity. Normal range of motion.     Cervical back: Normal range of motion and neck  supple.  Skin:    General: Skin is warm and dry.     Coloration: Skin is not jaundiced or pale.  Neurological:     General: No focal deficit present.     Mental Status: He is alert and oriented to person, place, and time.     Cranial Nerves: No cranial nerve deficit.     Sensory: No sensory deficit.     Motor: No weakness.     Coordination: Coordination normal.  Psychiatric:        Mood and Affect: Mood normal.        Behavior: Behavior normal.     (all labs ordered are listed, but only abnormal results are displayed) Labs Reviewed  COMPREHENSIVE METABOLIC PANEL WITH GFR - Abnormal; Notable for the following components:      Result Value   Glucose, Bld 152 (*)    All other components within normal limits  CBC WITH DIFFERENTIAL/PLATELET - Abnormal; Notable for the following components:   WBC 11.6 (*)    Neutro Abs 9.6 (*)    Abs Immature Granulocytes 0.19 (*)    All other components within normal limits  URINALYSIS, ROUTINE W REFLEX MICROSCOPIC - Abnormal; Notable for the following components:   APPearance HAZY (*)    All other components within normal limits  LIPASE, BLOOD  MAGNESIUM  TROPONIN T, HIGH SENSITIVITY  TROPONIN T, HIGH SENSITIVITY    EKG: EKG Interpretation Date/Time:  Wednesday February 05 2024 03:20:10 EST Ventricular Rate:  81 PR Interval:  177 QRS Duration:  98 QT Interval:  374 QTC Calculation: 435 R Axis:   80  Text Interpretation: Sinus rhythm Atrial premature complex Low voltage, precordial leads Confirmed by Melvenia Motto ([id number]) on 02/05/2024 3:44:32 AM  Radiology: CT Angio Chest PE W and/or Wo Contrast Result Date: 02/05/2024 EXAM: CTA CHEST PE WITH CONTRAST CT ABDOMEN AND PELVIS WITH CONTRAST 02/05/2024 05:33:10 AM TECHNIQUE: CTA of the chest was performed after the administration of 100 mL of iohexol  (OMNIPAQUE ) 350 MG/ML injection. Multiplanar reformatted images are provided for review. MIP images are provided for review. CT of the abdomen and  pelvis was performed with the administration of intravenous contrast. Automated exposure control, iterative reconstruction, and/or weight based adjustment of the mA/kV was utilized to reduce the radiation dose to as low as reasonably achievable. COMPARISON: Portable chest from today, PA lateral chest 12/18/2021, and CT abdomen and pelvis without contrast 08/09/2017. CLINICAL HISTORY: Pulmonary embolism (PE) suspected, high probability. Sudden onset of vomiting and headache this morning. FINDINGS: CHEST: PULMONARY ARTERIES: Pulmonary arteries are adequately opacified for evaluation. The main pulmonary arteries are prominent, measuring 3 c m indicating arterial hypertension. Arterial  opacification is diagnostic with no evidence of arterial emboli. MEDIASTINUM: There is mild cardiomegaly with left chamber predominance. There are left main and 2-vessel coronary calcifications in the circumflex and the LAD coronary arteries. No pericardial effusion. No mediastinal lymphadenopathy. There is a small hiatal hernia. Mild mediastinal lipomatosis. There is a heterogeneous 2.5 cm nodule in the right lobe of the thyroid  gland. Nonemergent follow-up ultrasound is recommended. The left lobe is unremarkable. The thoracic aorta demonstrates thickening and calcifications over the valve leaflets, moderate atherosclerosis, and there is mild atherosclerosis of the great vessels. There is no aneurysm, stenosis, or dissection. LUNGS AND PLEURA: Noted diffuse bronchial thickening. Accessory azygos lobe and fissure medial right apex. Occasional subsegmental bronchial impactions in both lower lobes. There is low inspiration on exam with asymmetric atelectasis in the left base, mild posterior atelectasis in both lower lobes. There is a 3 mm solid lingular nodule on series 11 axial 74. There are no further nodules. No active infiltrate. No pleural effusion or pneumothorax. SOFT TISSUES AND BONES: There are a few tiny calcified left axillary  lymph nodes. No noncalcified axillary or thoracic adenopathy. There is multilevel thoracic spine bridging of the esophagus, mild dextroscoliosis. No acute or significant osseous findings. ABDOMEN AND PELVIS: LIVER: The liver is unremarkable. GALLBLADDER AND BILE DUCTS: Gallbladder is unremarkable. No biliary ductal dilatation. SPLEEN: Spleen demonstrates no acute abnormality. PANCREAS: Pancreas demonstrates no acute abnormality. ADRENAL GLANDS: Adrenal glands demonstrate no acute abnormality. KIDNEYS, URETERS AND BLADDER: There are small renal cortical hypodensities which are too small to characterize. There is a 9 mm hypodensity anteriorly in the left upper pole and a 4 mm hypodensity posteriorly in the right mid pole, findings consistent with Bosniak type 2 cysts. There is a 1.3 cm Bosniak 1 cyst lower pole left kidney, Hounsfield density is 15.4. No follow-up imaging is recommended. There is no renal mass enhancement. There is mild chronic perinephric stranding. Single punctate nonobstructive calyceal stone is present in the superior pole of the left kidney. No other nephrolithiasis is seen, no ureteral stone or hydronephrosis. The urinary bladder demonstrates no significant thickening. GI AND BOWEL: Stomach and duodenal sweep demonstrate no acute abnormality. There is no small bowel obstruction or inflammation. There is a normal appendix. There is diffuse colonic diverticulosis, heaviest in the sigmoid. No evidence of colitis or diverticulitis. No abnormal bowel wall thickening or distension. REPRODUCTIVE: The prostate is enlarged, measuring 5.4 cm transversely, previously 4.9 cm, again noted left posterior parenchymal calcification. There is nodular median lobe hypertrophy impressing into the bladder base. PERITONEUM AND RETROPERITONEUM: There is umbilical level rectus diastasis without an incarcerated ventral hernia. There are small inguinal fat hernias. Clustered surgical clips anterior pelvic floor. There is  aortic atherosclerosis without aneurysm or dissection. No ascites or free air. LYMPH NODES: No lymphadenopathy. BONES AND SOFT TISSUES: Advanced degenerative disc disease noted in the lumbar spine. There are chronic bone islands in the pelvis. No acute osseous findings. No focal soft tissue abnormality. IMPRESSION: 1. No evidence of pulmonary embolism. 2. Mild cardiomegaly with prominent main pulmonary arteries suggesting pulmonary arterial hypertension. 3. Heterogeneous 2.5 cm right thyroid  nodule, for which nonemergent thyroid  ultrasound is recommended. 4. Diffuse diverticulosis without evidence of diverticulitis. 5. Enlarged prostate with median lobe nodular hypertrophy impressing into the inferior bladder. 6. 3 mm solitary lung nodule. No follow-up imaging is recommended. 7. Bronchitis with Scattered subsegmental bronchial impactions in both lower lobes. No active infiltrates were seen. 8. Aortic and coronary atherosclerosis. Electronically signed by: Francis Quam MD  02/05/2024 06:26 AM EST RP Workstation: [id number]   CT ABDOMEN PELVIS W CONTRAST Result Date: 02/05/2024 EXAM: CTA CHEST PE WITH CONTRAST CT ABDOMEN AND PELVIS WITH CONTRAST 02/05/2024 05:33:10 AM TECHNIQUE: CTA of the chest was performed after the administration of 100 mL of iohexol  (OMNIPAQUE ) 350 MG/ML injection. Multiplanar reformatted images are provided for review. MIP images are provided for review. CT of the abdomen and pelvis was performed with the administration of intravenous contrast. Automated exposure control, iterative reconstruction, and/or weight based adjustment of the mA/kV was utilized to reduce the radiation dose to as low as reasonably achievable. COMPARISON: Portable chest from today, PA lateral chest 12/18/2021, and CT abdomen and pelvis without contrast 08/09/2017. CLINICAL HISTORY: Pulmonary embolism (PE) suspected, high probability. Sudden onset of vomiting and headache this morning. FINDINGS: CHEST: PULMONARY  ARTERIES: Pulmonary arteries are adequately opacified for evaluation. The main pulmonary arteries are prominent, measuring 3 c m indicating arterial hypertension. Arterial opacification is diagnostic with no evidence of arterial emboli. MEDIASTINUM: There is mild cardiomegaly with left chamber predominance. There are left main and 2-vessel coronary calcifications in the circumflex and the LAD coronary arteries. No pericardial effusion. No mediastinal lymphadenopathy. There is a small hiatal hernia. Mild mediastinal lipomatosis. There is a heterogeneous 2.5 cm nodule in the right lobe of the thyroid  gland. Nonemergent follow-up ultrasound is recommended. The left lobe is unremarkable. The thoracic aorta demonstrates thickening and calcifications over the valve leaflets, moderate atherosclerosis, and there is mild atherosclerosis of the great vessels. There is no aneurysm, stenosis, or dissection. LUNGS AND PLEURA: Noted diffuse bronchial thickening. Accessory azygos lobe and fissure medial right apex. Occasional subsegmental bronchial impactions in both lower lobes. There is low inspiration on exam with asymmetric atelectasis in the left base, mild posterior atelectasis in both lower lobes. There is a 3 mm solid lingular nodule on series 11 axial 74. There are no further nodules. No active infiltrate. No pleural effusion or pneumothorax. SOFT TISSUES AND BONES: There are a few tiny calcified left axillary lymph nodes. No noncalcified axillary or thoracic adenopathy. There is multilevel thoracic spine bridging of the esophagus, mild dextroscoliosis. No acute or significant osseous findings. ABDOMEN AND PELVIS: LIVER: The liver is unremarkable. GALLBLADDER AND BILE DUCTS: Gallbladder is unremarkable. No biliary ductal dilatation. SPLEEN: Spleen demonstrates no acute abnormality. PANCREAS: Pancreas demonstrates no acute abnormality. ADRENAL GLANDS: Adrenal glands demonstrate no acute abnormality. KIDNEYS, URETERS AND  BLADDER: There are small renal cortical hypodensities which are too small to characterize. There is a 9 mm hypodensity anteriorly in the left upper pole and a 4 mm hypodensity posteriorly in the right mid pole, findings consistent with Bosniak type 2 cysts. There is a 1.3 cm Bosniak 1 cyst lower pole left kidney, Hounsfield density is 15.4. No follow-up imaging is recommended. There is no renal mass enhancement. There is mild chronic perinephric stranding. Single punctate nonobstructive calyceal stone is present in the superior pole of the left kidney. No other nephrolithiasis is seen, no ureteral stone or hydronephrosis. The urinary bladder demonstrates no significant thickening. GI AND BOWEL: Stomach and duodenal sweep demonstrate no acute abnormality. There is no small bowel obstruction or inflammation. There is a normal appendix. There is diffuse colonic diverticulosis, heaviest in the sigmoid. No evidence of colitis or diverticulitis. No abnormal bowel wall thickening or distension. REPRODUCTIVE: The prostate is enlarged, measuring 5.4 cm transversely, previously 4.9 cm, again noted left posterior parenchymal calcification. There is nodular median lobe hypertrophy impressing into the bladder base. PERITONEUM AND RETROPERITONEUM:  There is umbilical level rectus diastasis without an incarcerated ventral hernia. There are small inguinal fat hernias. Clustered surgical clips anterior pelvic floor. There is aortic atherosclerosis without aneurysm or dissection. No ascites or free air. LYMPH NODES: No lymphadenopathy. BONES AND SOFT TISSUES: Advanced degenerative disc disease noted in the lumbar spine. There are chronic bone islands in the pelvis. No acute osseous findings. No focal soft tissue abnormality. IMPRESSION: 1. No evidence of pulmonary embolism. 2. Mild cardiomegaly with prominent main pulmonary arteries suggesting pulmonary arterial hypertension. 3. Heterogeneous 2.5 cm right thyroid  nodule, for which  nonemergent thyroid  ultrasound is recommended. 4. Diffuse diverticulosis without evidence of diverticulitis. 5. Enlarged prostate with median lobe nodular hypertrophy impressing into the inferior bladder. 6. 3 mm solitary lung nodule. No follow-up imaging is recommended. 7. Bronchitis with Scattered subsegmental bronchial impactions in both lower lobes. No active infiltrates were seen. 8. Aortic and coronary atherosclerosis. Electronically signed by: Francis Quam MD 02/05/2024 06:26 AM EST RP Workstation: [id number]   CT Head Wo Contrast Result Date: 02/05/2024 EXAM: CT HEAD WITHOUT CONTRAST 02/05/2024 03:56:56 AM TECHNIQUE: CT of the head was performed without the administration of intravenous contrast. Automated exposure control, iterative reconstruction, and/or weight based adjustment of the mA/kV was utilized to reduce the radiation dose to as low as reasonably achievable. COMPARISON: None available. CLINICAL HISTORY: 84 year old male. Syncope/presyncope, cerebrovascular cause suspected. Sudden onset nausea and vomiting. FINDINGS: BRAIN AND VENTRICLES: No acute hemorrhage. No evidence of acute cortical infarct. No hydrocephalus. No extra-axial collection. No mass effect or midline shift. Normal brain volume for age. Bilaterally symmetric heterogeneous hypodensity in the deep white matter capsules and deep gray matter nuclei slightly greater on the left (series 2 images 14 and 15). Otherwise maintained gray white differentiation. No suspicious intracranial vascular hyperdensity. ORBITS: No acute abnormality. SINUSES: Visible paranasal sinuses, tympanic cavities and mastoids are well aerated. SOFT TISSUES AND SKULL: No acute soft tissue abnormality. No skull fracture. IMPRESSION: 1. No acute intracranial abnormality. 2. Moderate small vessel disease in the deep white matter and deep gray nuclei. Electronically signed by: Helayne Hurst MD 02/05/2024 04:22 AM EST RP Workstation: [id number]   DG Chest Portable 1  View Result Date: 02/05/2024 EXAM: 1 VIEW(S) XRAY OF THE CHEST 02/05/2024 03:26:00 AM COMPARISON: PA and lateral 12/18/2021. CLINICAL HISTORY: woke up this morning with sudden onset of vomiting and headache. FINDINGS: LUNGS AND PLEURA: The lungs are expiratory. There is asymmetric increased opacity in the hypoinflated left base, which could be asymmetric atelectasis, pneumonia, or aspiration. A frontal and lateral study in full inspiration is recommended to see if this persists. No other focal opacity is seen with limited view of both bases. No pleural effusion. No pneumothorax. HEART AND MEDIASTINUM: The mediastinum is stable with patchy calcific plaque in the aorta. There is mild cardiomegaly with no evidence of CHF. BONES AND SOFT TISSUES: The bones are mildly demineralized. No acute osseous abnormality. IMPRESSION: 1. Asymmetric increased opacity in the hypoinflated left base, which could represent asymmetric atelectasis, pneumonia, or aspiration. Frontal and lateral study in full inspiration is recommended to see if this persists as the current single view exam is expiratory. Electronically signed by: Francis Quam MD 02/05/2024 03:40 AM EST RP Workstation: [id number]     Procedures   Medications Ordered in the ED  lactated ringers  bolus 1,000 mL (0 mLs Intravenous Stopped 02/05/24 0351)  ondansetron  (ZOFRAN ) injection 4 mg (4 mg Intravenous Given 02/05/24 0326)  iohexol  (OMNIPAQUE ) 350 MG/ML injection 100 mL (100 mLs Intravenous Contrast Given  02/05/24 0519)                                    Medical Decision Making Amount and/or Complexity of Data Reviewed Labs: ordered. Radiology: ordered.  Risk Prescription drug management.   This patient presents to the ED for concern of dizziness, nausea, vomiting, this involves an extensive number of treatment options, and is a complaint that carries with it a high risk of complications and morbidity.  The differential diagnosis includes enteritis,  vestibular neuritis, BPPV, Mnire's disease, GERD   Co morbidities / Chronic conditions that complicate the patient evaluation  HTN, HLD, OSA, GERD, arthritis   Additional history obtained:  Additional history obtained from EMR External records from outside source obtained and reviewed including N/A   Lab Tests:  I Ordered, and personally interpreted labs.  The pertinent results include: Slight leukocytosis is present.  Lab work is otherwise unremarkable.   Imaging Studies ordered:  I ordered imaging studies including chest x-ray, CT of head, CTA chest, CT of abdomen and pelvis I independently visualized and interpreted imaging which showed no acute findings I agree with the radiologist interpretation   Cardiac Monitoring: / EKG:  The patient was maintained on a cardiac monitor.  I personally viewed and interpreted the cardiac monitored which showed an underlying rhythm of: Sinus rhythm   Problem List / ED Course / Critical interventions / Medication management  Patient presented for acute onset of dizziness, nausea, vomiting.  This started approximately 1 hour prior to arrival.  On arrival, patient's symptoms have mostly resolved.  He endorses some ongoing nausea.  Zofran  was ordered.  Given his persistent vomiting and fluid losses prior to arrival, IV fluids were ordered for hydration.  Workup was initiated.  Patient's lab work notable for slight leukocytosis.  This is likely source to margination from his episodes of vomiting prior to arrival.  On chest x-ray, there was concern of an asymmetric opacity.  CT imaging was ordered to further evaluate.  Patient underwent CT imaging of head, chest, abdomen, pelvis.  Results did not show any acute findings.  CT lung findings are not consistent with pneumonia.  On reassessment, patient symptoms have resolved.  He was given food and drink and was able to tolerate p.o. intake.  He is stable for discharge. I ordered medication including IV  fluids for hydration, Zofran  for nausea Reevaluation of the patient after these medicines showed that the patient improved I have reviewed the patients home medicines and have made adjustments as needed  Social Determinants of Health:  Lives independently     Final diagnoses:  Nausea and vomiting, unspecified vomiting type    ED Discharge Orders          Ordered    ondansetron  (ZOFRAN -ODT) 4 MG disintegrating tablet  Every 8 hours PRN        02/05/24 0659               Melvenia Motto, MD 02/05/24 [phone]  "

## 2024-02-05 NOTE — ED Triage Notes (Signed)
 Pt reports he went to ED last night by EMS due to vomiting and dizziness. Pt reports he had an IV and meds for the vomiting, felt better and went home. Reports he feels ears clogged. Pt wants to know if he can take Loratadine  with the meds he is currently taking.

## 2024-02-05 NOTE — ED Provider Notes (Signed)
 " Producer, Television/film/video - [facility]  Note:  This document was prepared using Conservation officer, historic buildings and may include unintentional dictation errors.  MRN: [id number] DOB: July 10, 1940  Subjective:   Todd Larson is a 84 y.o. male presenting for recheck on persistent intermittent dizziness.  Patient was seen through the emergency room overnight.  He was stabilized and discharged this morning.  Feels a lot better but still has ear fullness, clogged the ear sensation, dizziness.  No headache, confusion, weakness, numbness or tingling, abdominal pain, hematemesis, bloody stool.  Current Outpatient Medications  Medication Instructions   Blood Pressure Monitoring (ADULT BLOOD PRESSURE CUFF LG) KIT To monitor Blood pressure cuff   carbamide peroxide (DEBROX) 6.5 % OTIC solution 5 drops, Both EARS, Daily PRN   Cholecalciferol (VITAMIN D -3 PO) 2,000 Units, Daily   finasteride (PROSCAR) 5 mg, Daily   fluticasone  (FLONASE ) 50 MCG/ACT nasal spray USE 1 SPRAY(S) IN EACH NOSTRIL TWICE DAILY AS NEEDED FOR RUNNY NOSE   hydrochlorothiazide  (HYDRODIURIL ) 12.5 mg, Oral, Daily   loratadine  (CLARITIN ) 10 mg, Oral, Daily   losartan  (COZAAR ) 50 mg, Oral, Daily   Multiple Vitamins-Minerals (ONE A DAY MEN 50 PLUS PO) Daily   ondansetron  (ZOFRAN -ODT) 4 mg, Oral, Every 8 hours PRN   Polyvinyl Alcohol-Povidone (REFRESH OP) 1 drop, As needed    Allergies[1]  Past Medical History:  Diagnosis Date   Arthritis    cervical spine - T7-8 & lumbar area - bone spurs up & down the back    Bulging discs     thoracic spine   Diverticulosis of colon    GERD (gastroesophageal reflux disease)    related to intestinal system, - has started Align  4-5 days ago-    Hyperlipidemia    Hypertension    Inguinal hernia    OSA (obstructive sleep apnea)    saw Dr. Neysa- 02/2015- pt. remarks that as a result BP med. was changed , pt. has a sleep number bed & he reports the score averages in the 80% range    PSA  elevation    History of    Recurrent sinus infections    related to fracture nose x2, also reports that he has a deviated septum     Past Surgical History:  Procedure Laterality Date   COLONOSCOPY  2008   3 internal hemorrhoids, diverticulosis;Dr.Jacobs   INSERTION OF MESH N/A 11/13/2016   Procedure: INSERTION OF MESH;  Surgeon: Rubin Calamity, MD;  Location: [facility] OR;  Service: General;  Laterality: N/A;   LAPAROSCOPIC INGUINAL HERNIA WITH UMBILICAL HERNIA Left 11/13/2016   Procedure: LAPAROSCOPIC LEFT INGUINAL HERNIA  AND UMBILICAL HERNIA REPAIRS WITH MESH;  Surgeon: Rubin Calamity, MD;  Location: [facility] OR;  Service: General;  Laterality: Left;   PROSTATE BIOPSY     x 2, once abnormal, once normal   TONSILLECTOMY      Family History  Problem Relation Age of Onset   Lung cancer Maternal Grandfather        enviromental    Heart attack Paternal Uncle    Prostate cancer Brother     Social History   Occupational History   Occupation: retired  Tobacco Use   Smoking status: Never   Smokeless tobacco: Never  Vaping Use   Vaping status: Never Used  Substance and Sexual Activity   Alcohol use: Yes    Alcohol/week: 14.0 standard drinks of alcohol    Types: 14 Glasses of wine per week   Drug use: No  Sexual activity: Not Currently     ROS   Objective:   Vitals: BP (!) 144/78 (BP Location: Right Arm)   Pulse 99   Temp 98 F (36.7 C) (Oral)   Resp 18   SpO2 97%   Physical Exam Constitutional:      General: He is not in acute distress.    Appearance: Normal appearance. He is well-developed and normal weight. He is not ill-appearing, toxic-appearing or diaphoretic.  HENT:     Head: Normocephalic and atraumatic.     Right Ear: Tympanic membrane, ear canal and external ear normal. There is no impacted cerumen.     Left Ear: Tympanic membrane, ear canal and external ear normal. There is no impacted cerumen.     Nose: Nose normal.     Mouth/Throat:     Pharynx:  Oropharynx is clear.  Eyes:     General: No scleral icterus.       Right eye: No discharge.        Left eye: No discharge.     Extraocular Movements: Extraocular movements intact.  Cardiovascular:     Rate and Rhythm: Normal rate.  Pulmonary:     Effort: Pulmonary effort is normal.  Musculoskeletal:     Cervical back: Normal range of motion.  Neurological:     Mental Status: He is alert and oriented to person, place, and time.     Cranial Nerves: No cranial nerve deficit.     Motor: No weakness.     Coordination: Coordination normal.     Gait: Gait normal.  Psychiatric:        Mood and Affect: Mood normal.        Behavior: Behavior normal.        Thought Content: Thought content normal.        Judgment: Judgment normal.     CT Angio Chest PE W and/or Wo Contrast Result Date: 02/05/2024 EXAM: CTA CHEST PE WITH CONTRAST CT ABDOMEN AND PELVIS WITH CONTRAST 02/05/2024 05:33:10 AM TECHNIQUE: CTA of the chest was performed after the administration of 100 mL of iohexol  (OMNIPAQUE ) 350 MG/ML injection. Multiplanar reformatted images are provided for review. MIP images are provided for review. CT of the abdomen and pelvis was performed with the administration of intravenous contrast. Automated exposure control, iterative reconstruction, and/or weight based adjustment of the mA/kV was utilized to reduce the radiation dose to as low as reasonably achievable. COMPARISON: Portable chest from today, PA lateral chest 12/18/2021, and CT abdomen and pelvis without contrast 08/09/2017. CLINICAL HISTORY: Pulmonary embolism (PE) suspected, high probability. Sudden onset of vomiting and headache this morning. FINDINGS: CHEST: PULMONARY ARTERIES: Pulmonary arteries are adequately opacified for evaluation. The main pulmonary arteries are prominent, measuring 3 c m indicating arterial hypertension. Arterial opacification is diagnostic with no evidence of arterial emboli. MEDIASTINUM: There is mild cardiomegaly  with left chamber predominance. There are left main and 2-vessel coronary calcifications in the circumflex and the LAD coronary arteries. No pericardial effusion. No mediastinal lymphadenopathy. There is a small hiatal hernia. Mild mediastinal lipomatosis. There is a heterogeneous 2.5 cm nodule in the right lobe of the thyroid  gland. Nonemergent follow-up ultrasound is recommended. The left lobe is unremarkable. The thoracic aorta demonstrates thickening and calcifications over the valve leaflets, moderate atherosclerosis, and there is mild atherosclerosis of the great vessels. There is no aneurysm, stenosis, or dissection. LUNGS AND PLEURA: Noted diffuse bronchial thickening. Accessory azygos lobe and fissure medial right apex. Occasional subsegmental bronchial impactions in both  lower lobes. There is low inspiration on exam with asymmetric atelectasis in the left base, mild posterior atelectasis in both lower lobes. There is a 3 mm solid lingular nodule on series 11 axial 74. There are no further nodules. No active infiltrate. No pleural effusion or pneumothorax. SOFT TISSUES AND BONES: There are a few tiny calcified left axillary lymph nodes. No noncalcified axillary or thoracic adenopathy. There is multilevel thoracic spine bridging of the esophagus, mild dextroscoliosis. No acute or significant osseous findings. ABDOMEN AND PELVIS: LIVER: The liver is unremarkable. GALLBLADDER AND BILE DUCTS: Gallbladder is unremarkable. No biliary ductal dilatation. SPLEEN: Spleen demonstrates no acute abnormality. PANCREAS: Pancreas demonstrates no acute abnormality. ADRENAL GLANDS: Adrenal glands demonstrate no acute abnormality. KIDNEYS, URETERS AND BLADDER: There are small renal cortical hypodensities which are too small to characterize. There is a 9 mm hypodensity anteriorly in the left upper pole and a 4 mm hypodensity posteriorly in the right mid pole, findings consistent with Bosniak type 2 cysts. There is a 1.3 cm  Bosniak 1 cyst lower pole left kidney, Hounsfield density is 15.4. No follow-up imaging is recommended. There is no renal mass enhancement. There is mild chronic perinephric stranding. Single punctate nonobstructive calyceal stone is present in the superior pole of the left kidney. No other nephrolithiasis is seen, no ureteral stone or hydronephrosis. The urinary bladder demonstrates no significant thickening. GI AND BOWEL: Stomach and duodenal sweep demonstrate no acute abnormality. There is no small bowel obstruction or inflammation. There is a normal appendix. There is diffuse colonic diverticulosis, heaviest in the sigmoid. No evidence of colitis or diverticulitis. No abnormal bowel wall thickening or distension. REPRODUCTIVE: The prostate is enlarged, measuring 5.4 cm transversely, previously 4.9 cm, again noted left posterior parenchymal calcification. There is nodular median lobe hypertrophy impressing into the bladder base. PERITONEUM AND RETROPERITONEUM: There is umbilical level rectus diastasis without an incarcerated ventral hernia. There are small inguinal fat hernias. Clustered surgical clips anterior pelvic floor. There is aortic atherosclerosis without aneurysm or dissection. No ascites or free air. LYMPH NODES: No lymphadenopathy. BONES AND SOFT TISSUES: Advanced degenerative disc disease noted in the lumbar spine. There are chronic bone islands in the pelvis. No acute osseous findings. No focal soft tissue abnormality. IMPRESSION: 1. No evidence of pulmonary embolism. 2. Mild cardiomegaly with prominent main pulmonary arteries suggesting pulmonary arterial hypertension. 3. Heterogeneous 2.5 cm right thyroid  nodule, for which nonemergent thyroid  ultrasound is recommended. 4. Diffuse diverticulosis without evidence of diverticulitis. 5. Enlarged prostate with median lobe nodular hypertrophy impressing into the inferior bladder. 6. 3 mm solitary lung nodule. No follow-up imaging is recommended. 7.  Bronchitis with Scattered subsegmental bronchial impactions in both lower lobes. No active infiltrates were seen. 8. Aortic and coronary atherosclerosis. Electronically signed by: Francis Quam MD 02/05/2024 06:26 AM EST RP Workstation: [id number]   CT ABDOMEN PELVIS W CONTRAST Result Date: 02/05/2024 EXAM: CTA CHEST PE WITH CONTRAST CT ABDOMEN AND PELVIS WITH CONTRAST 02/05/2024 05:33:10 AM TECHNIQUE: CTA of the chest was performed after the administration of 100 mL of iohexol  (OMNIPAQUE ) 350 MG/ML injection. Multiplanar reformatted images are provided for review. MIP images are provided for review. CT of the abdomen and pelvis was performed with the administration of intravenous contrast. Automated exposure control, iterative reconstruction, and/or weight based adjustment of the mA/kV was utilized to reduce the radiation dose to as low as reasonably achievable. COMPARISON: Portable chest from today, PA lateral chest 12/18/2021, and CT abdomen and pelvis without contrast 08/09/2017. CLINICAL HISTORY: Pulmonary embolism (  PE) suspected, high probability. Sudden onset of vomiting and headache this morning. FINDINGS: CHEST: PULMONARY ARTERIES: Pulmonary arteries are adequately opacified for evaluation. The main pulmonary arteries are prominent, measuring 3 c m indicating arterial hypertension. Arterial opacification is diagnostic with no evidence of arterial emboli. MEDIASTINUM: There is mild cardiomegaly with left chamber predominance. There are left main and 2-vessel coronary calcifications in the circumflex and the LAD coronary arteries. No pericardial effusion. No mediastinal lymphadenopathy. There is a small hiatal hernia. Mild mediastinal lipomatosis. There is a heterogeneous 2.5 cm nodule in the right lobe of the thyroid  gland. Nonemergent follow-up ultrasound is recommended. The left lobe is unremarkable. The thoracic aorta demonstrates thickening and calcifications over the valve leaflets, moderate  atherosclerosis, and there is mild atherosclerosis of the great vessels. There is no aneurysm, stenosis, or dissection. LUNGS AND PLEURA: Noted diffuse bronchial thickening. Accessory azygos lobe and fissure medial right apex. Occasional subsegmental bronchial impactions in both lower lobes. There is low inspiration on exam with asymmetric atelectasis in the left base, mild posterior atelectasis in both lower lobes. There is a 3 mm solid lingular nodule on series 11 axial 74. There are no further nodules. No active infiltrate. No pleural effusion or pneumothorax. SOFT TISSUES AND BONES: There are a few tiny calcified left axillary lymph nodes. No noncalcified axillary or thoracic adenopathy. There is multilevel thoracic spine bridging of the esophagus, mild dextroscoliosis. No acute or significant osseous findings. ABDOMEN AND PELVIS: LIVER: The liver is unremarkable. GALLBLADDER AND BILE DUCTS: Gallbladder is unremarkable. No biliary ductal dilatation. SPLEEN: Spleen demonstrates no acute abnormality. PANCREAS: Pancreas demonstrates no acute abnormality. ADRENAL GLANDS: Adrenal glands demonstrate no acute abnormality. KIDNEYS, URETERS AND BLADDER: There are small renal cortical hypodensities which are too small to characterize. There is a 9 mm hypodensity anteriorly in the left upper pole and a 4 mm hypodensity posteriorly in the right mid pole, findings consistent with Bosniak type 2 cysts. There is a 1.3 cm Bosniak 1 cyst lower pole left kidney, Hounsfield density is 15.4. No follow-up imaging is recommended. There is no renal mass enhancement. There is mild chronic perinephric stranding. Single punctate nonobstructive calyceal stone is present in the superior pole of the left kidney. No other nephrolithiasis is seen, no ureteral stone or hydronephrosis. The urinary bladder demonstrates no significant thickening. GI AND BOWEL: Stomach and duodenal sweep demonstrate no acute abnormality. There is no small bowel  obstruction or inflammation. There is a normal appendix. There is diffuse colonic diverticulosis, heaviest in the sigmoid. No evidence of colitis or diverticulitis. No abnormal bowel wall thickening or distension. REPRODUCTIVE: The prostate is enlarged, measuring 5.4 cm transversely, previously 4.9 cm, again noted left posterior parenchymal calcification. There is nodular median lobe hypertrophy impressing into the bladder base. PERITONEUM AND RETROPERITONEUM: There is umbilical level rectus diastasis without an incarcerated ventral hernia. There are small inguinal fat hernias. Clustered surgical clips anterior pelvic floor. There is aortic atherosclerosis without aneurysm or dissection. No ascites or free air. LYMPH NODES: No lymphadenopathy. BONES AND SOFT TISSUES: Advanced degenerative disc disease noted in the lumbar spine. There are chronic bone islands in the pelvis. No acute osseous findings. No focal soft tissue abnormality. IMPRESSION: 1. No evidence of pulmonary embolism. 2. Mild cardiomegaly with prominent main pulmonary arteries suggesting pulmonary arterial hypertension. 3. Heterogeneous 2.5 cm right thyroid  nodule, for which nonemergent thyroid  ultrasound is recommended. 4. Diffuse diverticulosis without evidence of diverticulitis. 5. Enlarged prostate with median lobe nodular hypertrophy impressing into the inferior bladder. 6.  3 mm solitary lung nodule. No follow-up imaging is recommended. 7. Bronchitis with Scattered subsegmental bronchial impactions in both lower lobes. No active infiltrates were seen. 8. Aortic and coronary atherosclerosis. Electronically signed by: Francis Quam MD 02/05/2024 06:26 AM EST RP Workstation: [id number]   CT Head Wo Contrast Result Date: 02/05/2024 EXAM: CT HEAD WITHOUT CONTRAST 02/05/2024 03:56:56 AM TECHNIQUE: CT of the head was performed without the administration of intravenous contrast. Automated exposure control, iterative reconstruction, and/or weight based  adjustment of the mA/kV was utilized to reduce the radiation dose to as low as reasonably achievable. COMPARISON: None available. CLINICAL HISTORY: 84 year old male. Syncope/presyncope, cerebrovascular cause suspected. Sudden onset nausea and vomiting. FINDINGS: BRAIN AND VENTRICLES: No acute hemorrhage. No evidence of acute cortical infarct. No hydrocephalus. No extra-axial collection. No mass effect or midline shift. Normal brain volume for age. Bilaterally symmetric heterogeneous hypodensity in the deep white matter capsules and deep gray matter nuclei slightly greater on the left (series 2 images 14 and 15). Otherwise maintained gray white differentiation. No suspicious intracranial vascular hyperdensity. ORBITS: No acute abnormality. SINUSES: Visible paranasal sinuses, tympanic cavities and mastoids are well aerated. SOFT TISSUES AND SKULL: No acute soft tissue abnormality. No skull fracture. IMPRESSION: 1. No acute intracranial abnormality. 2. Moderate small vessel disease in the deep white matter and deep gray nuclei. Electronically signed by: Helayne Hurst MD 02/05/2024 04:22 AM EST RP Workstation: [id number]   DG Chest Portable 1 View Result Date: 02/05/2024 EXAM: 1 VIEW(S) XRAY OF THE CHEST 02/05/2024 03:26:00 AM COMPARISON: PA and lateral 12/18/2021. CLINICAL HISTORY: woke up this morning with sudden onset of vomiting and headache. FINDINGS: LUNGS AND PLEURA: The lungs are expiratory. There is asymmetric increased opacity in the hypoinflated left base, which could be asymmetric atelectasis, pneumonia, or aspiration. A frontal and lateral study in full inspiration is recommended to see if this persists. No other focal opacity is seen with limited view of both bases. No pleural effusion. No pneumothorax. HEART AND MEDIASTINUM: The mediastinum is stable with patchy calcific plaque in the aorta. There is mild cardiomegaly with no evidence of CHF. BONES AND SOFT TISSUES: The bones are mildly demineralized. No  acute osseous abnormality. IMPRESSION: 1. Asymmetric increased opacity in the hypoinflated left base, which could represent asymmetric atelectasis, pneumonia, or aspiration. Frontal and lateral study in full inspiration is recommended to see if this persists as the current single view exam is expiratory. Electronically signed by: Francis Quam MD 02/05/2024 03:40 AM EST RP Workstation: [id number]     Results for orders placed or performed during the hospital encounter of 02/05/24 (from the past 24 hours)  Urinalysis, Routine w reflex microscopic -Urine, Clean Catch     Status: Abnormal   Collection Time: 02/05/24  3:21 AM  Result Value Ref Range   Color, Urine YELLOW YELLOW   APPearance HAZY (A) CLEAR   Specific Gravity, Urine 1.013 1.005 - 1.030   pH 7.0 5.0 - 8.0   Glucose, UA NEGATIVE NEGATIVE mg/dL   Hgb urine dipstick NEGATIVE NEGATIVE   Bilirubin Urine NEGATIVE NEGATIVE   Ketones, ur NEGATIVE NEGATIVE mg/dL   Protein, ur NEGATIVE NEGATIVE mg/dL   Nitrite NEGATIVE NEGATIVE   Leukocytes,Ua NEGATIVE NEGATIVE  Comprehensive metabolic panel     Status: Abnormal   Collection Time: 02/05/24  3:30 AM  Result Value Ref Range   Sodium 139 135 - 145 mmol/L   Potassium 3.7 3.5 - 5.1 mmol/L   Chloride 101 98 - 111 mmol/L   CO2 26 22 -  32 mmol/L   Glucose, Bld 152 (H) 70 - 99 mg/dL   BUN 11 8 - 23 mg/dL   Creatinine, Ser 9.27 0.61 - 1.24 mg/dL   Calcium 9.3 8.9 - 89.6 mg/dL   Total Protein 6.5 6.5 - 8.1 g/dL   Albumin 4.2 3.5 - 5.0 g/dL   AST 27 15 - 41 U/L   ALT 29 0 - 44 U/L   Alkaline Phosphatase 60 38 - 126 U/L   Total Bilirubin 1.0 0.0 - 1.2 mg/dL   GFR, Estimated >39 >39 mL/min   Anion gap 12 5 - 15  Lipase, blood     Status: None   Collection Time: 02/05/24  3:30 AM  Result Value Ref Range   Lipase 32 11 - 51 U/L  CBC with Diff     Status: Abnormal   Collection Time: 02/05/24  3:30 AM  Result Value Ref Range   WBC 11.6 (H) 4.0 - 10.5 K/uL   RBC 4.67 4.22 - 5.81 MIL/uL    Hemoglobin 15.3 13.0 - 17.0 g/dL   HCT 54.2 60.9 - 47.9 %   MCV 97.9 80.0 - 100.0 fL   MCH 32.8 26.0 - 34.0 pg   MCHC 33.5 30.0 - 36.0 g/dL   RDW 86.3 88.4 - 84.4 %   Platelets 295 150 - 400 K/uL   nRBC 0.0 0.0 - 0.2 %   Neutrophils Relative % 81 %   Neutro Abs 9.6 (H) 1.7 - 7.7 K/uL   Lymphocytes Relative 7 %   Lymphs Abs 0.8 0.7 - 4.0 K/uL   Monocytes Relative 8 %   Monocytes Absolute 0.9 0.1 - 1.0 K/uL   Eosinophils Relative 1 %   Eosinophils Absolute 0.1 0.0 - 0.5 K/uL   Basophils Relative 1 %   Basophils Absolute 0.1 0.0 - 0.1 K/uL   Immature Granulocytes 2 %   Abs Immature Granulocytes 0.19 (H) 0.00 - 0.07 K/uL  Magnesium     Status: None   Collection Time: 02/05/24  3:30 AM  Result Value Ref Range   Magnesium 2.1 1.7 - 2.4 mg/dL  Troponin T, High Sensitivity     Status: None   Collection Time: 02/05/24  3:30 AM  Result Value Ref Range   Troponin T High Sensitivity 9 0 - 19 ng/L  Troponin T, High Sensitivity     Status: None   Collection Time: 02/05/24  5:50 AM  Result Value Ref Range   Troponin T High Sensitivity 9 0 - 19 ng/L    Assessment and Plan :   PDMP not reviewed this encounter.  1. Eustachian tube dysfunction, bilateral   2. Dizziness    I suspect primary issue eustachian tube dysfunction.  Has a history of an allergic rhinitis, has previously had success with Claritin .  Recommend meclizine  for the symptoms and prednisone  for the underlying ETD.  Will defer repeat ER visit. Counseled patient on potential for adverse effects with medications prescribed/recommended today, ER and return-to-clinic precautions discussed, patient verbalized understanding.     [1]  Allergies Allergen Reactions   Amoxicillin -Pot Clavulanate Diarrhea and Other (See Comments)    Night sweats, not allergic to Amoxicillin , just can't tolerate augmentin     [name], PA-C 02/05/24 1631  "

## 2024-02-05 NOTE — Discharge Instructions (Addendum)
 Your test results today are reassuring.  A prescription for a medication called ondansetron  was sent to your pharmacy.  Take this only as needed for nausea.  Drink plenty of fluids at home to maintain good hydration.  Return to the emergency department for any new or worsening symptoms of concern.

## 2024-02-05 NOTE — ED Triage Notes (Signed)
 Pt BIB [facility] from home with c/o sudden onset n/v upon waking up, pt reports feeling dizzy, all symptoms have subsided other than abd pain, pt concerned about food poisoning as he had seafood for dinner, V/S WNL en route, CBG 166

## 2024-02-14 ENCOUNTER — Other Ambulatory Visit

## 2024-12-28 ENCOUNTER — Ambulatory Visit
